# Patient Record
Sex: Female | Born: 1940
Health system: Southern US, Community
[De-identification: ages and names within clinical notes are randomized; demographics above are authoritative.]

## PROBLEM LIST (undated history)

## (undated) DIAGNOSIS — E119 Type 2 diabetes mellitus without complications: Secondary | ICD-10-CM

## (undated) DIAGNOSIS — T7840XA Allergy, unspecified, initial encounter: Secondary | ICD-10-CM

## (undated) DIAGNOSIS — I10 Essential (primary) hypertension: Secondary | ICD-10-CM

## (undated) HISTORY — DX: Type 2 diabetes mellitus without complications: E11.9

## (undated) HISTORY — PX: ABDOMINAL HYSTERECTOMY: SHX81

## (undated) HISTORY — DX: Allergy, unspecified, initial encounter: T78.40XA

## (undated) HISTORY — DX: Essential (primary) hypertension: I10

---

## 1998-09-22 ENCOUNTER — Encounter: Payer: Self-pay | Admitting: Internal Medicine

## 1998-09-22 ENCOUNTER — Ambulatory Visit (HOSPITAL_COMMUNITY): Admission: RE | Admit: 1998-09-22 | Discharge: 1998-09-22 | Payer: Self-pay | Admitting: Internal Medicine

## 2000-01-01 ENCOUNTER — Encounter: Payer: Self-pay | Admitting: *Deleted

## 2000-01-01 ENCOUNTER — Ambulatory Visit (HOSPITAL_COMMUNITY): Admission: RE | Admit: 2000-01-01 | Discharge: 2000-01-01 | Payer: Self-pay | Admitting: *Deleted

## 2000-01-29 ENCOUNTER — Ambulatory Visit (HOSPITAL_COMMUNITY): Admission: RE | Admit: 2000-01-29 | Discharge: 2000-01-29 | Payer: Self-pay | Admitting: *Deleted

## 2000-01-29 ENCOUNTER — Encounter (INDEPENDENT_AMBULATORY_CARE_PROVIDER_SITE_OTHER): Payer: Self-pay | Admitting: Specialist

## 2000-12-06 ENCOUNTER — Encounter (INDEPENDENT_AMBULATORY_CARE_PROVIDER_SITE_OTHER): Payer: Self-pay | Admitting: *Deleted

## 2000-12-06 ENCOUNTER — Ambulatory Visit (HOSPITAL_COMMUNITY): Admission: RE | Admit: 2000-12-06 | Discharge: 2000-12-06 | Payer: Self-pay | Admitting: *Deleted

## 2001-01-02 ENCOUNTER — Encounter: Payer: Self-pay | Admitting: *Deleted

## 2001-01-02 ENCOUNTER — Ambulatory Visit (HOSPITAL_COMMUNITY): Admission: RE | Admit: 2001-01-02 | Discharge: 2001-01-02 | Payer: Self-pay | Admitting: *Deleted

## 2001-11-30 ENCOUNTER — Ambulatory Visit (HOSPITAL_COMMUNITY): Admission: RE | Admit: 2001-11-30 | Discharge: 2001-11-30 | Payer: Self-pay | Admitting: *Deleted

## 2001-11-30 ENCOUNTER — Encounter: Payer: Self-pay | Admitting: *Deleted

## 2002-01-03 ENCOUNTER — Encounter: Payer: Self-pay | Admitting: *Deleted

## 2002-01-03 ENCOUNTER — Ambulatory Visit (HOSPITAL_COMMUNITY): Admission: RE | Admit: 2002-01-03 | Discharge: 2002-01-03 | Payer: Self-pay | Admitting: *Deleted

## 2003-01-09 ENCOUNTER — Encounter: Payer: Self-pay | Admitting: *Deleted

## 2003-01-09 ENCOUNTER — Ambulatory Visit (HOSPITAL_COMMUNITY): Admission: RE | Admit: 2003-01-09 | Discharge: 2003-01-09 | Payer: Self-pay | Admitting: *Deleted

## 2004-03-26 ENCOUNTER — Ambulatory Visit (HOSPITAL_COMMUNITY): Admission: RE | Admit: 2004-03-26 | Discharge: 2004-03-26 | Payer: Self-pay | Admitting: *Deleted

## 2005-04-09 ENCOUNTER — Ambulatory Visit (HOSPITAL_COMMUNITY): Admission: RE | Admit: 2005-04-09 | Discharge: 2005-04-09 | Payer: Self-pay | Admitting: Internal Medicine

## 2006-04-27 ENCOUNTER — Ambulatory Visit (HOSPITAL_COMMUNITY): Admission: RE | Admit: 2006-04-27 | Discharge: 2006-04-27 | Payer: Self-pay | Admitting: Internal Medicine

## 2007-05-01 ENCOUNTER — Ambulatory Visit (HOSPITAL_COMMUNITY): Admission: RE | Admit: 2007-05-01 | Discharge: 2007-05-01 | Payer: Self-pay | Admitting: Internal Medicine

## 2008-04-24 ENCOUNTER — Encounter: Admission: RE | Admit: 2008-04-24 | Discharge: 2008-04-24 | Payer: Self-pay | Admitting: Internal Medicine

## 2008-05-01 ENCOUNTER — Ambulatory Visit (HOSPITAL_COMMUNITY): Admission: RE | Admit: 2008-05-01 | Discharge: 2008-05-01 | Payer: Self-pay | Admitting: Internal Medicine

## 2009-05-02 ENCOUNTER — Ambulatory Visit (HOSPITAL_COMMUNITY): Admission: RE | Admit: 2009-05-02 | Discharge: 2009-05-02 | Payer: Self-pay | Admitting: Internal Medicine

## 2010-05-07 ENCOUNTER — Ambulatory Visit (HOSPITAL_COMMUNITY): Admission: RE | Admit: 2010-05-07 | Discharge: 2010-05-07 | Payer: Self-pay | Admitting: Internal Medicine

## 2011-02-12 NOTE — Procedures (Signed)
[place]  Patient:    Leslie Reeves, Leslie Reeves                       MRN: [id number] Proc. Date: 01/29/00 Adm. Date:  [id number] Disc. Date: [id number] Attending:  Woody Seller                           Procedure Report  REFERRING PHYSICIAN:  Woody Seller., M.D.  PREOPERATIVE DIAGNOSES: 1. Abdominal pains. 2. Constipation.  POSTOPERATIVE DIAGNOSIS:  Multiple small polypoid lesions throughout the ascending and transverse colon, removed via hot biopsy.  PROCEDURE:  Colonoscopy and hot biopsy.  ENDOSCOPIST:  Katherina Mires, Brooke Bonito., M.D.  MEDICATIONS:  Demerol 60 mg IV, Versed 6 mg IV over 10 minute period of time.  INSTRUMENT:  Olympus video pancolonoscope.  INDICATIONS:  This pleasant 70 year old female presented to the office for evaluation of abdominal pains and discomforts.  The pains appeared to be more generalized in character at this time.  There was no history of any change in stool color or character that was noted.  The patient was noted that the symptoms progressively worsened at this time.  Previous evaluations appeared to be grossly unremarkable at this time.  The patient was thus scheduled for a colonoscopic examination.  OBJECTIVE FINDINGS:  Her vital signs were stable.  Her HEENT examination was anicteric.  NECK was supple.  LUNGS were clear.  HEART had a regular rate and rhythm without heaves, thrills, murmurs or gallops.  The ABDOMEN was obese, mild tenderness to palpation and generalized abdomen region at this time.  No rebound or referred tenderness appreciated.  Digital rectal examination was normal.  The EXTREMITIES revealed no clubbing, cyanosis, or edema.  PLAN:  To proceed with the colonoscopic examination.  INFORMED CONSENT:  Patient was advised of the procedure, indications, and the risk involved.  She has agreed to have the procedure performed.  PREOPERATIVE PREPARATION:  Patient was brought in the endoscopy unit, when  an IV for IV sedating medication was started.  A monitor was placed on the patient to monitor the patients vital signs and oxygen saturation.  Nasal oxygen at 2 L/min was used and after adequate sedation was performed, the procedure was begun.  PROCEDURE NOTE:  The instrument was advanced, patient lying in left lateral position via direct technique without difficulty to approximately 97 cm proximal colon to the cecal region.  This was confirmed by palpation, as well as, visualization of the ileocecal valve.  There appeared to be evidence of small polypoid lesions present in the transverse and ascending colon area that was noted.  These polyps appeared to be 1-3 mm in size.  Multiple hot biopsies were obtained at this time.  Postbiopsies, there was some evidence of residual heme that was noted.  The areas were flushed with water and there was no evidence of any continued bleeding that was noted.  At this time.  Otherwise, there were no abnormalities, such as any other masses or strictures appreciated at this time.  The vascular pattern appeared to be well within normal limits throughout the entire area.  The mucosal pattern showed no evidence of an occasional diverticuli that was appreciated, mostly in the ascending region that was noted at this time.  The diverticula appeared to be small without any inflammatory or inflammation around the area.  There was increased twisting of the bowel tissue that was noted, almost consistent  with a possible volvulus that could be occurring.  Photographs were taken of the areas that were noted.  However, the instrument was able to advance through these regions without any complications appreciated.  The instrument was subsequently retracted back, where the polypoid lesions were removed at this time.  No evidence of any major complications was noted. Although, a few areas were not biopsied, several of the ones that were noted, were hot biopsied and  removed and although the characteristics of them appeared to be essentially normal tissue from that stand point.  On occasion, residual heme was noted, again, but these areas subsequently stopped.  There was one polypoid lesion that was not biopsied at this time because of its nature and character in the ascending area.  This will be reevaluated at the next time the procedure is performed and depending upon these results will determine the course of therapy from that standpoint.  The instrument was removed per rectum without difficulty with the patient tolerating the procedure well.  TREATMENT: 1. To repeat the procedure in one year. 2. Await the results of a pathology report. 3. Have the patient follow up in the office. 4. Have the patient avoid any aspirin-like therapy or increased distension of    the abdomen for at least the next several days or to a week at this time.    Depending upon these results will determine the course of therapy. DD:  01/29/00 TD:  02/01/00 Job: [id number]

## 2011-04-05 ENCOUNTER — Other Ambulatory Visit (HOSPITAL_COMMUNITY): Payer: Self-pay | Admitting: Internal Medicine

## 2011-04-05 DIAGNOSIS — Z1231 Encounter for screening mammogram for malignant neoplasm of breast: Secondary | ICD-10-CM

## 2011-05-10 ENCOUNTER — Ambulatory Visit (HOSPITAL_COMMUNITY)
Admission: RE | Admit: 2011-05-10 | Discharge: 2011-05-10 | Disposition: A | Discharge status: Home / Self Care | Payer: Medicare Other | Source: Ambulatory Visit | Attending: Internal Medicine | Admitting: Internal Medicine

## 2011-05-10 DIAGNOSIS — Z1231 Encounter for screening mammogram for malignant neoplasm of breast: Secondary | ICD-10-CM | POA: Insufficient documentation

## 2012-04-18 ENCOUNTER — Other Ambulatory Visit (HOSPITAL_COMMUNITY): Payer: Self-pay | Admitting: Internal Medicine

## 2012-04-18 DIAGNOSIS — Z1231 Encounter for screening mammogram for malignant neoplasm of breast: Secondary | ICD-10-CM

## 2012-05-10 ENCOUNTER — Ambulatory Visit (HOSPITAL_COMMUNITY)
Admission: RE | Admit: 2012-05-10 | Discharge: 2012-05-10 | Disposition: A | Discharge status: Home / Self Care | Payer: Medicare Other | Source: Ambulatory Visit | Attending: Internal Medicine | Admitting: Internal Medicine

## 2012-05-10 DIAGNOSIS — Z1231 Encounter for screening mammogram for malignant neoplasm of breast: Secondary | ICD-10-CM | POA: Insufficient documentation

## 2013-04-13 ENCOUNTER — Other Ambulatory Visit (HOSPITAL_COMMUNITY): Payer: Self-pay | Admitting: Internal Medicine

## 2013-04-13 DIAGNOSIS — Z1231 Encounter for screening mammogram for malignant neoplasm of breast: Secondary | ICD-10-CM

## 2013-05-14 ENCOUNTER — Ambulatory Visit (HOSPITAL_COMMUNITY)
Admission: RE | Admit: 2013-05-14 | Discharge: 2013-05-14 | Disposition: A | Discharge status: Home / Self Care | Payer: Medicare Other | Source: Ambulatory Visit | Attending: Internal Medicine | Admitting: Internal Medicine

## 2013-05-14 DIAGNOSIS — Z1231 Encounter for screening mammogram for malignant neoplasm of breast: Secondary | ICD-10-CM | POA: Insufficient documentation

## 2014-05-10 ENCOUNTER — Other Ambulatory Visit (HOSPITAL_COMMUNITY): Payer: Self-pay | Admitting: Internal Medicine

## 2014-05-10 DIAGNOSIS — Z1231 Encounter for screening mammogram for malignant neoplasm of breast: Secondary | ICD-10-CM

## 2014-05-29 ENCOUNTER — Ambulatory Visit (HOSPITAL_COMMUNITY)
Admission: RE | Admit: 2014-05-29 | Discharge: 2014-05-29 | Disposition: A | Discharge status: Home / Self Care | Payer: 59 | Source: Ambulatory Visit | Attending: Internal Medicine | Admitting: Internal Medicine

## 2014-05-29 DIAGNOSIS — Z1231 Encounter for screening mammogram for malignant neoplasm of breast: Secondary | ICD-10-CM | POA: Diagnosis present

## 2014-09-24 ENCOUNTER — Ambulatory Visit
Admission: RE | Admit: 2014-09-24 | Discharge: 2014-09-24 | Disposition: A | Discharge status: Home / Self Care | Payer: Medicare Other | Source: Ambulatory Visit | Attending: Family | Admitting: Family

## 2014-09-24 ENCOUNTER — Other Ambulatory Visit: Payer: Self-pay | Admitting: Family

## 2014-09-24 DIAGNOSIS — E049 Nontoxic goiter, unspecified: Secondary | ICD-10-CM

## 2014-10-07 ENCOUNTER — Other Ambulatory Visit: Payer: Self-pay | Admitting: Family

## 2014-10-07 DIAGNOSIS — E049 Nontoxic goiter, unspecified: Secondary | ICD-10-CM

## 2014-12-13 DIAGNOSIS — E039 Hypothyroidism, unspecified: Secondary | ICD-10-CM | POA: Diagnosis not present

## 2014-12-13 DIAGNOSIS — I129 Hypertensive chronic kidney disease with stage 1 through stage 4 chronic kidney disease, or unspecified chronic kidney disease: Secondary | ICD-10-CM | POA: Diagnosis not present

## 2014-12-13 DIAGNOSIS — N183 Chronic kidney disease, stage 3 (moderate): Secondary | ICD-10-CM | POA: Diagnosis not present

## 2014-12-13 DIAGNOSIS — J309 Allergic rhinitis, unspecified: Secondary | ICD-10-CM | POA: Diagnosis not present

## 2014-12-13 DIAGNOSIS — E1121 Type 2 diabetes mellitus with diabetic nephropathy: Secondary | ICD-10-CM | POA: Diagnosis not present

## 2014-12-13 DIAGNOSIS — J449 Chronic obstructive pulmonary disease, unspecified: Secondary | ICD-10-CM | POA: Diagnosis not present

## 2015-03-10 DIAGNOSIS — Z0100 Encounter for examination of eyes and vision without abnormal findings: Secondary | ICD-10-CM | POA: Diagnosis not present

## 2015-03-10 DIAGNOSIS — E119 Type 2 diabetes mellitus without complications: Secondary | ICD-10-CM | POA: Diagnosis not present

## 2015-03-17 DIAGNOSIS — J209 Acute bronchitis, unspecified: Secondary | ICD-10-CM | POA: Diagnosis not present

## 2015-03-17 DIAGNOSIS — I129 Hypertensive chronic kidney disease with stage 1 through stage 4 chronic kidney disease, or unspecified chronic kidney disease: Secondary | ICD-10-CM | POA: Diagnosis not present

## 2015-03-17 DIAGNOSIS — N183 Chronic kidney disease, stage 3 (moderate): Secondary | ICD-10-CM | POA: Diagnosis not present

## 2015-03-17 DIAGNOSIS — E1121 Type 2 diabetes mellitus with diabetic nephropathy: Secondary | ICD-10-CM | POA: Diagnosis not present

## 2015-03-17 DIAGNOSIS — R252 Cramp and spasm: Secondary | ICD-10-CM | POA: Diagnosis not present

## 2015-03-19 DIAGNOSIS — Z5181 Encounter for therapeutic drug level monitoring: Secondary | ICD-10-CM | POA: Diagnosis not present

## 2015-03-19 DIAGNOSIS — E039 Hypothyroidism, unspecified: Secondary | ICD-10-CM | POA: Diagnosis not present

## 2015-03-19 DIAGNOSIS — J449 Chronic obstructive pulmonary disease, unspecified: Secondary | ICD-10-CM | POA: Diagnosis not present

## 2015-03-19 DIAGNOSIS — N183 Chronic kidney disease, stage 3 (moderate): Secondary | ICD-10-CM | POA: Diagnosis not present

## 2015-03-19 DIAGNOSIS — Z79899 Other long term (current) drug therapy: Secondary | ICD-10-CM | POA: Diagnosis not present

## 2015-03-19 DIAGNOSIS — R32 Unspecified urinary incontinence: Secondary | ICD-10-CM | POA: Diagnosis not present

## 2015-04-28 ENCOUNTER — Other Ambulatory Visit (HOSPITAL_COMMUNITY): Payer: Self-pay | Admitting: Internal Medicine

## 2015-04-28 DIAGNOSIS — Z1231 Encounter for screening mammogram for malignant neoplasm of breast: Secondary | ICD-10-CM

## 2015-06-04 ENCOUNTER — Ambulatory Visit (HOSPITAL_COMMUNITY): Payer: Medicaid Other

## 2015-06-11 ENCOUNTER — Ambulatory Visit (HOSPITAL_COMMUNITY)
Admission: RE | Admit: 2015-06-11 | Discharge: 2015-06-11 | Disposition: A | Discharge status: Home / Self Care | Payer: Medicare Other | Source: Ambulatory Visit | Attending: Internal Medicine | Admitting: Internal Medicine

## 2015-06-11 DIAGNOSIS — Z1231 Encounter for screening mammogram for malignant neoplasm of breast: Secondary | ICD-10-CM | POA: Insufficient documentation

## 2015-06-23 DIAGNOSIS — E782 Mixed hyperlipidemia: Secondary | ICD-10-CM | POA: Diagnosis not present

## 2015-06-23 DIAGNOSIS — N183 Chronic kidney disease, stage 3 (moderate): Secondary | ICD-10-CM | POA: Diagnosis not present

## 2015-06-23 DIAGNOSIS — R252 Cramp and spasm: Secondary | ICD-10-CM | POA: Diagnosis not present

## 2015-06-23 DIAGNOSIS — E1121 Type 2 diabetes mellitus with diabetic nephropathy: Secondary | ICD-10-CM | POA: Diagnosis not present

## 2015-06-23 DIAGNOSIS — I129 Hypertensive chronic kidney disease with stage 1 through stage 4 chronic kidney disease, or unspecified chronic kidney disease: Secondary | ICD-10-CM | POA: Diagnosis not present

## 2015-09-18 DIAGNOSIS — E039 Hypothyroidism, unspecified: Secondary | ICD-10-CM | POA: Diagnosis not present

## 2015-09-18 DIAGNOSIS — I129 Hypertensive chronic kidney disease with stage 1 through stage 4 chronic kidney disease, or unspecified chronic kidney disease: Secondary | ICD-10-CM | POA: Diagnosis not present

## 2015-09-18 DIAGNOSIS — N183 Chronic kidney disease, stage 3 (moderate): Secondary | ICD-10-CM | POA: Diagnosis not present

## 2015-09-18 DIAGNOSIS — E782 Mixed hyperlipidemia: Secondary | ICD-10-CM | POA: Diagnosis not present

## 2015-09-18 DIAGNOSIS — R7309 Other abnormal glucose: Secondary | ICD-10-CM | POA: Diagnosis not present

## 2015-09-18 DIAGNOSIS — J22 Unspecified acute lower respiratory infection: Secondary | ICD-10-CM | POA: Diagnosis not present

## 2015-12-05 DIAGNOSIS — Z720 Tobacco use: Secondary | ICD-10-CM | POA: Diagnosis not present

## 2015-12-05 DIAGNOSIS — N183 Chronic kidney disease, stage 3 (moderate): Secondary | ICD-10-CM | POA: Diagnosis not present

## 2015-12-05 DIAGNOSIS — E049 Nontoxic goiter, unspecified: Secondary | ICD-10-CM | POA: Diagnosis not present

## 2015-12-05 DIAGNOSIS — I129 Hypertensive chronic kidney disease with stage 1 through stage 4 chronic kidney disease, or unspecified chronic kidney disease: Secondary | ICD-10-CM | POA: Diagnosis not present

## 2015-12-16 ENCOUNTER — Other Ambulatory Visit: Payer: Self-pay | Admitting: Nephrology

## 2015-12-16 DIAGNOSIS — E049 Nontoxic goiter, unspecified: Secondary | ICD-10-CM

## 2015-12-16 DIAGNOSIS — N183 Chronic kidney disease, stage 3 unspecified: Secondary | ICD-10-CM

## 2015-12-17 ENCOUNTER — Ambulatory Visit
Admission: RE | Admit: 2015-12-17 | Discharge: 2015-12-17 | Disposition: A | Discharge status: Home / Self Care | Payer: Medicare Other | Source: Ambulatory Visit | Attending: Nephrology | Admitting: Nephrology

## 2015-12-17 DIAGNOSIS — E049 Nontoxic goiter, unspecified: Secondary | ICD-10-CM

## 2015-12-17 DIAGNOSIS — N183 Chronic kidney disease, stage 3 unspecified: Secondary | ICD-10-CM

## 2015-12-25 DIAGNOSIS — E1121 Type 2 diabetes mellitus with diabetic nephropathy: Secondary | ICD-10-CM | POA: Diagnosis not present

## 2015-12-25 DIAGNOSIS — N183 Chronic kidney disease, stage 3 (moderate): Secondary | ICD-10-CM | POA: Diagnosis not present

## 2015-12-25 DIAGNOSIS — I129 Hypertensive chronic kidney disease with stage 1 through stage 4 chronic kidney disease, or unspecified chronic kidney disease: Secondary | ICD-10-CM | POA: Diagnosis not present

## 2015-12-25 DIAGNOSIS — E782 Mixed hyperlipidemia: Secondary | ICD-10-CM | POA: Diagnosis not present

## 2015-12-25 DIAGNOSIS — E039 Hypothyroidism, unspecified: Secondary | ICD-10-CM | POA: Diagnosis not present

## 2016-01-26 DIAGNOSIS — E1121 Type 2 diabetes mellitus with diabetic nephropathy: Secondary | ICD-10-CM | POA: Diagnosis not present

## 2016-01-26 DIAGNOSIS — N183 Chronic kidney disease, stage 3 (moderate): Secondary | ICD-10-CM | POA: Diagnosis not present

## 2016-01-26 DIAGNOSIS — I129 Hypertensive chronic kidney disease with stage 1 through stage 4 chronic kidney disease, or unspecified chronic kidney disease: Secondary | ICD-10-CM | POA: Diagnosis not present

## 2016-01-26 DIAGNOSIS — E039 Hypothyroidism, unspecified: Secondary | ICD-10-CM | POA: Diagnosis not present

## 2016-01-26 DIAGNOSIS — N08 Glomerular disorders in diseases classified elsewhere: Secondary | ICD-10-CM | POA: Diagnosis not present

## 2016-02-26 DIAGNOSIS — I129 Hypertensive chronic kidney disease with stage 1 through stage 4 chronic kidney disease, or unspecified chronic kidney disease: Secondary | ICD-10-CM | POA: Diagnosis not present

## 2016-02-26 DIAGNOSIS — Z720 Tobacco use: Secondary | ICD-10-CM | POA: Diagnosis not present

## 2016-02-26 DIAGNOSIS — N183 Chronic kidney disease, stage 3 (moderate): Secondary | ICD-10-CM | POA: Diagnosis not present

## 2016-03-22 DIAGNOSIS — E119 Type 2 diabetes mellitus without complications: Secondary | ICD-10-CM | POA: Diagnosis not present

## 2016-05-03 DIAGNOSIS — E1121 Type 2 diabetes mellitus with diabetic nephropathy: Secondary | ICD-10-CM | POA: Diagnosis not present

## 2016-05-03 DIAGNOSIS — E782 Mixed hyperlipidemia: Secondary | ICD-10-CM | POA: Diagnosis not present

## 2016-05-03 DIAGNOSIS — E039 Hypothyroidism, unspecified: Secondary | ICD-10-CM | POA: Diagnosis not present

## 2016-05-03 DIAGNOSIS — N08 Glomerular disorders in diseases classified elsewhere: Secondary | ICD-10-CM | POA: Diagnosis not present

## 2016-05-03 DIAGNOSIS — I129 Hypertensive chronic kidney disease with stage 1 through stage 4 chronic kidney disease, or unspecified chronic kidney disease: Secondary | ICD-10-CM | POA: Diagnosis not present

## 2016-05-14 ENCOUNTER — Other Ambulatory Visit: Payer: Self-pay | Admitting: Internal Medicine

## 2016-05-14 DIAGNOSIS — Z1231 Encounter for screening mammogram for malignant neoplasm of breast: Secondary | ICD-10-CM

## 2016-06-11 ENCOUNTER — Ambulatory Visit
Admission: RE | Admit: 2016-06-11 | Discharge: 2016-06-11 | Disposition: A | Discharge status: Home / Self Care | Payer: Medicare Other | Source: Ambulatory Visit | Attending: Internal Medicine | Admitting: Internal Medicine

## 2016-06-11 DIAGNOSIS — Z1231 Encounter for screening mammogram for malignant neoplasm of breast: Secondary | ICD-10-CM

## 2016-08-02 DIAGNOSIS — E782 Mixed hyperlipidemia: Secondary | ICD-10-CM | POA: Diagnosis not present

## 2016-08-02 DIAGNOSIS — N08 Glomerular disorders in diseases classified elsewhere: Secondary | ICD-10-CM | POA: Diagnosis not present

## 2016-08-02 DIAGNOSIS — N183 Chronic kidney disease, stage 3 (moderate): Secondary | ICD-10-CM | POA: Diagnosis not present

## 2016-08-02 DIAGNOSIS — E039 Hypothyroidism, unspecified: Secondary | ICD-10-CM | POA: Diagnosis not present

## 2016-08-02 DIAGNOSIS — E1121 Type 2 diabetes mellitus with diabetic nephropathy: Secondary | ICD-10-CM | POA: Diagnosis not present

## 2016-08-02 DIAGNOSIS — I129 Hypertensive chronic kidney disease with stage 1 through stage 4 chronic kidney disease, or unspecified chronic kidney disease: Secondary | ICD-10-CM | POA: Diagnosis not present

## 2016-08-02 DIAGNOSIS — N182 Chronic kidney disease, stage 2 (mild): Secondary | ICD-10-CM | POA: Diagnosis not present

## 2017-01-10 DIAGNOSIS — E782 Mixed hyperlipidemia: Secondary | ICD-10-CM | POA: Diagnosis not present

## 2017-01-10 DIAGNOSIS — J45909 Unspecified asthma, uncomplicated: Secondary | ICD-10-CM | POA: Diagnosis not present

## 2017-01-10 DIAGNOSIS — E039 Hypothyroidism, unspecified: Secondary | ICD-10-CM | POA: Diagnosis not present

## 2017-01-10 DIAGNOSIS — I129 Hypertensive chronic kidney disease with stage 1 through stage 4 chronic kidney disease, or unspecified chronic kidney disease: Secondary | ICD-10-CM | POA: Diagnosis not present

## 2017-03-01 DIAGNOSIS — H524 Presbyopia: Secondary | ICD-10-CM | POA: Diagnosis not present

## 2017-03-01 DIAGNOSIS — E119 Type 2 diabetes mellitus without complications: Secondary | ICD-10-CM | POA: Diagnosis not present

## 2017-04-19 DIAGNOSIS — I129 Hypertensive chronic kidney disease with stage 1 through stage 4 chronic kidney disease, or unspecified chronic kidney disease: Secondary | ICD-10-CM | POA: Diagnosis not present

## 2017-04-19 DIAGNOSIS — Z87891 Personal history of nicotine dependence: Secondary | ICD-10-CM | POA: Diagnosis not present

## 2017-04-19 DIAGNOSIS — N183 Chronic kidney disease, stage 3 (moderate): Secondary | ICD-10-CM | POA: Diagnosis not present

## 2017-04-29 DIAGNOSIS — E559 Vitamin D deficiency, unspecified: Secondary | ICD-10-CM | POA: Diagnosis not present

## 2017-04-29 DIAGNOSIS — H6123 Impacted cerumen, bilateral: Secondary | ICD-10-CM | POA: Diagnosis not present

## 2017-04-29 DIAGNOSIS — N951 Menopausal and female climacteric states: Secondary | ICD-10-CM | POA: Diagnosis not present

## 2017-04-29 DIAGNOSIS — N183 Chronic kidney disease, stage 3 (moderate): Secondary | ICD-10-CM | POA: Diagnosis not present

## 2017-04-29 DIAGNOSIS — Z0000 Encounter for general adult medical examination without abnormal findings: Secondary | ICD-10-CM | POA: Diagnosis not present

## 2017-04-29 DIAGNOSIS — N08 Glomerular disorders in diseases classified elsewhere: Secondary | ICD-10-CM | POA: Diagnosis not present

## 2017-04-29 DIAGNOSIS — I129 Hypertensive chronic kidney disease with stage 1 through stage 4 chronic kidney disease, or unspecified chronic kidney disease: Secondary | ICD-10-CM | POA: Diagnosis not present

## 2017-04-29 DIAGNOSIS — Z1231 Encounter for screening mammogram for malignant neoplasm of breast: Secondary | ICD-10-CM | POA: Diagnosis not present

## 2017-04-29 DIAGNOSIS — E1121 Type 2 diabetes mellitus with diabetic nephropathy: Secondary | ICD-10-CM | POA: Diagnosis not present

## 2017-04-29 DIAGNOSIS — E039 Hypothyroidism, unspecified: Secondary | ICD-10-CM | POA: Diagnosis not present

## 2017-05-02 ENCOUNTER — Ambulatory Visit
Admission: RE | Admit: 2017-05-02 | Discharge: 2017-05-02 | Disposition: A | Discharge status: Home / Self Care | Payer: Medicare Other | Source: Ambulatory Visit | Attending: Nurse Practitioner | Admitting: Nurse Practitioner

## 2017-05-02 ENCOUNTER — Other Ambulatory Visit: Payer: Self-pay | Admitting: Nurse Practitioner

## 2017-05-02 ENCOUNTER — Other Ambulatory Visit: Payer: Self-pay | Admitting: Internal Medicine

## 2017-05-02 DIAGNOSIS — R053 Chronic cough: Secondary | ICD-10-CM

## 2017-05-02 DIAGNOSIS — R05 Cough: Secondary | ICD-10-CM

## 2017-05-02 DIAGNOSIS — Z1231 Encounter for screening mammogram for malignant neoplasm of breast: Secondary | ICD-10-CM

## 2017-05-03 ENCOUNTER — Other Ambulatory Visit: Payer: Self-pay | Admitting: Nurse Practitioner

## 2017-05-03 DIAGNOSIS — R634 Abnormal weight loss: Secondary | ICD-10-CM

## 2017-05-18 ENCOUNTER — Ambulatory Visit
Admission: RE | Admit: 2017-05-18 | Discharge: 2017-05-18 | Disposition: A | Discharge status: Home / Self Care | Payer: Medicare Other | Source: Ambulatory Visit | Attending: Nurse Practitioner | Admitting: Nurse Practitioner

## 2017-05-18 DIAGNOSIS — R634 Abnormal weight loss: Secondary | ICD-10-CM

## 2017-05-18 DIAGNOSIS — E042 Nontoxic multinodular goiter: Secondary | ICD-10-CM | POA: Diagnosis not present

## 2017-06-13 ENCOUNTER — Ambulatory Visit
Admission: RE | Admit: 2017-06-13 | Discharge: 2017-06-13 | Disposition: A | Discharge status: Home / Self Care | Payer: Medicare Other | Source: Ambulatory Visit | Attending: Internal Medicine | Admitting: Internal Medicine

## 2017-06-13 DIAGNOSIS — Z1231 Encounter for screening mammogram for malignant neoplasm of breast: Secondary | ICD-10-CM

## 2017-07-14 DIAGNOSIS — Z86010 Personal history of colonic polyps: Secondary | ICD-10-CM | POA: Diagnosis not present

## 2017-07-14 DIAGNOSIS — K5730 Diverticulosis of large intestine without perforation or abscess without bleeding: Secondary | ICD-10-CM | POA: Diagnosis not present

## 2017-07-14 DIAGNOSIS — Z1211 Encounter for screening for malignant neoplasm of colon: Secondary | ICD-10-CM | POA: Diagnosis not present

## 2017-07-14 DIAGNOSIS — K5904 Chronic idiopathic constipation: Secondary | ICD-10-CM | POA: Diagnosis not present

## 2017-07-19 ENCOUNTER — Other Ambulatory Visit: Payer: Self-pay

## 2017-07-19 NOTE — Patient Outreach (Signed)
[facility]) Care Management  07/19/2017  Leslie Reeves September 04, 1941 [id number]   Medication Adherence call to Leslie Reeves the reason for this call is because Leslie Reeves is showing past due under [place] health Care Ins.on metformin 500 mg diabetes medication spoke to patient she said doctor Darleen Crocker told her to take half of tablet instead if one tablet daily patient still has medication.   [place] Management Direct Dial [phone]  Fax [phone] [name]

## 2017-07-27 DIAGNOSIS — D120 Benign neoplasm of cecum: Secondary | ICD-10-CM | POA: Diagnosis not present

## 2017-07-27 DIAGNOSIS — D123 Benign neoplasm of transverse colon: Secondary | ICD-10-CM | POA: Diagnosis not present

## 2017-07-27 DIAGNOSIS — Z86010 Personal history of colonic polyps: Secondary | ICD-10-CM | POA: Diagnosis not present

## 2017-07-27 DIAGNOSIS — K635 Polyp of colon: Secondary | ICD-10-CM | POA: Diagnosis not present

## 2017-07-27 DIAGNOSIS — Z1211 Encounter for screening for malignant neoplasm of colon: Secondary | ICD-10-CM | POA: Diagnosis not present

## 2017-11-04 DIAGNOSIS — I129 Hypertensive chronic kidney disease with stage 1 through stage 4 chronic kidney disease, or unspecified chronic kidney disease: Secondary | ICD-10-CM | POA: Diagnosis not present

## 2017-11-04 DIAGNOSIS — R7303 Prediabetes: Secondary | ICD-10-CM | POA: Diagnosis not present

## 2017-11-04 DIAGNOSIS — N08 Glomerular disorders in diseases classified elsewhere: Secondary | ICD-10-CM | POA: Diagnosis not present

## 2017-11-04 DIAGNOSIS — E039 Hypothyroidism, unspecified: Secondary | ICD-10-CM | POA: Diagnosis not present

## 2017-11-04 DIAGNOSIS — E782 Mixed hyperlipidemia: Secondary | ICD-10-CM | POA: Diagnosis not present

## 2018-01-11 DIAGNOSIS — Z86010 Personal history of colonic polyps: Secondary | ICD-10-CM | POA: Diagnosis not present

## 2018-01-11 DIAGNOSIS — D125 Benign neoplasm of sigmoid colon: Secondary | ICD-10-CM | POA: Diagnosis not present

## 2018-01-11 DIAGNOSIS — D120 Benign neoplasm of cecum: Secondary | ICD-10-CM | POA: Diagnosis not present

## 2018-01-11 DIAGNOSIS — K635 Polyp of colon: Secondary | ICD-10-CM | POA: Diagnosis not present

## 2018-01-11 DIAGNOSIS — Z1211 Encounter for screening for malignant neoplasm of colon: Secondary | ICD-10-CM | POA: Diagnosis not present

## 2018-03-07 DIAGNOSIS — E119 Type 2 diabetes mellitus without complications: Secondary | ICD-10-CM | POA: Diagnosis not present

## 2018-05-10 ENCOUNTER — Other Ambulatory Visit: Payer: Self-pay | Admitting: Internal Medicine

## 2018-05-10 DIAGNOSIS — Z1231 Encounter for screening mammogram for malignant neoplasm of breast: Secondary | ICD-10-CM

## 2018-05-12 ENCOUNTER — Other Ambulatory Visit: Payer: Self-pay | Admitting: Internal Medicine

## 2018-05-12 DIAGNOSIS — E2839 Other primary ovarian failure: Secondary | ICD-10-CM

## 2018-05-12 DIAGNOSIS — E049 Nontoxic goiter, unspecified: Secondary | ICD-10-CM

## 2018-05-15 ENCOUNTER — Ambulatory Visit
Admission: RE | Admit: 2018-05-15 | Discharge: 2018-05-15 | Disposition: A | Discharge status: Home / Self Care | Payer: Medicare Other | Source: Ambulatory Visit | Attending: Internal Medicine | Admitting: Internal Medicine

## 2018-05-15 DIAGNOSIS — E049 Nontoxic goiter, unspecified: Secondary | ICD-10-CM

## 2018-06-14 ENCOUNTER — Ambulatory Visit: Payer: Medicare Other

## 2018-07-03 ENCOUNTER — Ambulatory Visit
Admission: RE | Admit: 2018-07-03 | Discharge: 2018-07-03 | Disposition: A | Discharge status: Home / Self Care | Payer: Medicare Other | Source: Ambulatory Visit | Attending: Internal Medicine | Admitting: Internal Medicine

## 2018-07-03 DIAGNOSIS — E2839 Other primary ovarian failure: Secondary | ICD-10-CM

## 2018-07-03 DIAGNOSIS — Z1231 Encounter for screening mammogram for malignant neoplasm of breast: Secondary | ICD-10-CM

## 2018-07-04 ENCOUNTER — Other Ambulatory Visit: Payer: Self-pay | Admitting: Internal Medicine

## 2018-08-04 ENCOUNTER — Ambulatory Visit (INDEPENDENT_AMBULATORY_CARE_PROVIDER_SITE_OTHER): Payer: Medicare Other | Admitting: Nurse Practitioner

## 2018-08-04 ENCOUNTER — Encounter: Payer: Self-pay | Admitting: Nurse Practitioner

## 2018-08-04 VITALS — BP 130/60 | HR 54 | Temp 98.0°F | Ht 61.0 in | Wt 115.2 lb

## 2018-08-04 DIAGNOSIS — E041 Nontoxic single thyroid nodule: Secondary | ICD-10-CM | POA: Diagnosis not present

## 2018-08-04 NOTE — Progress Notes (Addendum)
  Subjective:     Patient ID: Leslie Reeves , female    DOB: [birth year] , 77 y.o.   MRN: [id number]   Chief Complaint  Patient presents with  . Diabetes    HPI  Thyroid Problem  Presents for follow-up visit. Patient reports no constipation or palpitations. (She is here after having a thyroid ultrasound in August.  Here for results. )     Past Medical History:  Diagnosis Date  . Allergy   . Diabetes mellitus without complication ([place])   . Hypertension      History reviewed. No pertinent family history.   Current Outpatient Medications:  .  amLODipine (NORVASC) 10 MG tablet, Take 10 mg by mouth daily., Disp: , Rfl: 1 .  metFORMIN (GLUCOPHAGE) 500 MG tablet, TAKE 1/2 TABLET TWICE A DAY, Disp: 90 tablet, Rfl: 5 .  olmesartan-hydrochlorothiazide (BENICAR HCT) 20-12.5 MG tablet, TAKE ONE TABLET BY MOUTH DAILY, Disp: , Rfl:  .  PROAIR HFA 108 (90 Base) MCG/ACT inhaler, , Disp: , Rfl:  .  rosuvastatin (CRESTOR) 20 MG tablet, Take 20 mg by mouth daily., Disp: , Rfl: 2 .  Vitamin D, Ergocalciferol, (DRISDOL) 1.25 MG (50000 UT) CAPS capsule, TAKE ONE TWICE A WEEK, Disp: , Rfl: 0 .  WIXELA INHUB 100-50 MCG/DOSE AEPB, TAKE 1 PUFF BY MOUTH EVERY 12 HOURS IN THE MORNING AND IN THE EVENING, Disp: , Rfl: 1   No Known Allergies   Review of Systems  HENT: Negative.   Respiratory: Negative.   Cardiovascular: Negative.  Negative for palpitations.  Gastrointestinal: Negative for constipation and nausea.  Musculoskeletal: Negative.   Skin: Negative.      Today's Vitals   08/04/18 0925  BP: 130/60  Pulse: (!) 54  Temp: 98 F (36.7 C)  TempSrc: Oral  SpO2: 94%  Weight: 115 lb 3.2 oz (52.3 kg)  Height: _0  (1.549 m)   Body mass index is 21.77 kg/m.   Objective:  Physical Exam  Constitutional: She is oriented to person, place, and time. She appears well-developed and well-nourished.  Cardiovascular: Normal rate, regular rhythm and normal heart sounds.  Pulmonary/Chest: Effort  normal. She has decreased breath sounds in the right upper field, the right lower field, the left upper field and the left lower field. She exhibits no tenderness.  Neurological: She is alert and oriented to person, place, and time.  Skin: Skin is warm and dry.        Assessment And Plan:     1. Right thyroid nodule  She has a right thyroid nodule that needs further evaluation according to the ultrasound and thyroid criteria.    Decreased breath sounds are normal for patient she has a history of smoking  - Ambulatory referral to ENT        [name], FNP

## 2018-08-09 ENCOUNTER — Other Ambulatory Visit: Payer: Self-pay | Admitting: Nurse Practitioner

## 2018-08-21 ENCOUNTER — Telehealth: Payer: Self-pay

## 2018-08-21 NOTE — Telephone Encounter (Signed)
Called pt to see if she is taking her blood pressure medicine olmesartan hctz because her ins is stating she may not be taking it. Patient stated she is currently taking it everyday. YRL,RMA

## 2018-08-23 DIAGNOSIS — E042 Nontoxic multinodular goiter: Secondary | ICD-10-CM | POA: Diagnosis not present

## 2018-08-23 DIAGNOSIS — E041 Nontoxic single thyroid nodule: Secondary | ICD-10-CM | POA: Diagnosis not present

## 2018-08-28 ENCOUNTER — Other Ambulatory Visit: Payer: Self-pay | Admitting: Otolaryngology

## 2018-08-28 DIAGNOSIS — E042 Nontoxic multinodular goiter: Secondary | ICD-10-CM

## 2018-08-31 ENCOUNTER — Ambulatory Visit
Admission: RE | Admit: 2018-08-31 | Discharge: 2018-08-31 | Disposition: A | Discharge status: Home / Self Care | Payer: Medicare Other | Source: Ambulatory Visit | Attending: Otolaryngology | Admitting: Otolaryngology

## 2018-08-31 ENCOUNTER — Other Ambulatory Visit (HOSPITAL_COMMUNITY)
Admission: RE | Admit: 2018-08-31 | Discharge: 2018-08-31 | Disposition: A | Discharge status: Home / Self Care | Payer: Medicare Other | Source: Ambulatory Visit | Attending: Radiology | Admitting: Radiology

## 2018-08-31 DIAGNOSIS — E041 Nontoxic single thyroid nodule: Secondary | ICD-10-CM | POA: Diagnosis not present

## 2018-08-31 DIAGNOSIS — E042 Nontoxic multinodular goiter: Secondary | ICD-10-CM | POA: Insufficient documentation

## 2018-09-05 ENCOUNTER — Other Ambulatory Visit: Payer: Self-pay

## 2018-09-05 NOTE — Patient Outreach (Signed)
[place] [facility]) Care Management  09/05/2018  Leslie Reeves [birth year] [id number]   Medication Adherence call to Leslie Reeves  spoke with patient she is showing due on Metformin 500 mg patient pick up this medication from [facility]  two weeks ago for a 90 days supply patient is not due until February /2020. [name] is showing past due under [place].   [place] Management Direct Dial [phone]  Fax [phone] [email or web address]

## 2018-10-09 DIAGNOSIS — E041 Nontoxic single thyroid nodule: Secondary | ICD-10-CM | POA: Insufficient documentation

## 2018-10-30 ENCOUNTER — Other Ambulatory Visit: Payer: Self-pay | Admitting: Nurse Practitioner

## 2018-11-10 ENCOUNTER — Other Ambulatory Visit: Payer: Self-pay | Admitting: Nurse Practitioner

## 2018-11-23 ENCOUNTER — Other Ambulatory Visit: Payer: Self-pay | Admitting: Nurse Practitioner

## 2018-11-23 DIAGNOSIS — R7303 Prediabetes: Secondary | ICD-10-CM

## 2018-11-23 MED ORDER — BLOOD GLUCOSE MONITOR KIT: PACK | 0 refills | Status: AC

## 2018-11-23 MED ORDER — BLOOD GLUCOSE MONITOR KIT: PACK | 0 refills | Status: DC

## 2018-11-27 ENCOUNTER — Other Ambulatory Visit: Payer: Self-pay | Admitting: Nurse Practitioner

## 2018-11-27 MED ORDER — GLUCOSE BLOOD VI STRP: ORAL_STRIP | 3 refills | Status: AC

## 2018-11-27 MED ORDER — GLUCOSE BLOOD VI STRP: ORAL_STRIP | 12 refills | Status: DC

## 2018-11-27 NOTE — Progress Notes (Signed)
Rx for test strips Aviva plus sent to [facility]

## 2018-12-01 ENCOUNTER — Other Ambulatory Visit: Payer: Self-pay | Admitting: Nurse Practitioner

## 2018-12-06 ENCOUNTER — Other Ambulatory Visit: Payer: Self-pay | Admitting: Internal Medicine

## 2018-12-10 ENCOUNTER — Other Ambulatory Visit: Payer: Self-pay | Admitting: Nurse Practitioner

## 2018-12-26 ENCOUNTER — Other Ambulatory Visit: Payer: Self-pay | Admitting: Nurse Practitioner

## 2019-01-04 ENCOUNTER — Telehealth: Payer: Self-pay

## 2019-01-04 NOTE — Telephone Encounter (Signed)
done

## 2019-01-15 DIAGNOSIS — I959 Hypotension, unspecified: Secondary | ICD-10-CM | POA: Diagnosis not present

## 2019-01-15 DIAGNOSIS — I10 Essential (primary) hypertension: Secondary | ICD-10-CM | POA: Diagnosis not present

## 2019-01-15 DIAGNOSIS — R4020 Unspecified coma: Secondary | ICD-10-CM | POA: Diagnosis not present

## 2019-01-15 DIAGNOSIS — R55 Syncope and collapse: Secondary | ICD-10-CM | POA: Diagnosis not present

## 2019-01-21 ENCOUNTER — Other Ambulatory Visit: Payer: Self-pay | Admitting: Nurse Practitioner

## 2019-01-31 ENCOUNTER — Other Ambulatory Visit: Payer: Self-pay | Admitting: Nurse Practitioner

## 2019-01-31 ENCOUNTER — Telehealth: Payer: Self-pay | Admitting: Internal Medicine

## 2019-01-31 NOTE — Telephone Encounter (Signed)
Pt agreed to telephone visit

## 2019-02-01 ENCOUNTER — Ambulatory Visit: Payer: Medicare Other | Admitting: Nurse Practitioner

## 2019-02-02 ENCOUNTER — Ambulatory Visit: Payer: Medicare Other | Admitting: Nurse Practitioner

## 2019-02-05 ENCOUNTER — Encounter: Payer: Self-pay | Admitting: Nurse Practitioner

## 2019-02-05 ENCOUNTER — Other Ambulatory Visit: Payer: Self-pay

## 2019-02-05 ENCOUNTER — Ambulatory Visit (INDEPENDENT_AMBULATORY_CARE_PROVIDER_SITE_OTHER): Payer: Medicare Other | Admitting: Nurse Practitioner

## 2019-02-05 VITALS — BP 128/60 | Wt 109.3 lb

## 2019-02-05 DIAGNOSIS — E041 Nontoxic single thyroid nodule: Secondary | ICD-10-CM

## 2019-02-05 DIAGNOSIS — R630 Anorexia: Secondary | ICD-10-CM | POA: Diagnosis not present

## 2019-02-05 DIAGNOSIS — I10 Essential (primary) hypertension: Secondary | ICD-10-CM

## 2019-02-05 DIAGNOSIS — R7303 Prediabetes: Secondary | ICD-10-CM

## 2019-02-05 NOTE — Progress Notes (Addendum)
Virtual Visit via telephone     This visit type was conducted due to national recommendations for restrictions regarding the COVID-19 Pandemic (e.g. social distancing) in an effort to limit this patient's exposure and mitigate transmission in our community.  Patients identity confirmed using two different identifiers.  This format is felt to be most appropriate for this patient at this time.  All issues noted in this document were discussed and addressed.  No physical exam was performed (except for noted visual exam findings with Video Visits).    Date:  02/19/2019   ID:  Leslie Reeves, DOB October 16, 1940, MRN [id number]  Patient Location:  Home - spoke with Lenore Cordia -   Provider location:   Office    Chief Complaint:  Diabetes   History of Present Illness:    Leslie Reeves is a 78 y.o. female who presents via video conferencing for a telehealth visit today.    The patient does not have symptoms concerning for COVID-19 infection (fever, chills, cough, or new shortness of breath).   Decreased appetite- she had a biopsy of her thyroid but has not followed up with Dr. Wilburn Cornelia.    Diabetes  She presents for her follow-up diabetic visit. Diabetes type: prediabetes. Her disease course has been stable. Pertinent negatives for hypoglycemia include no confusion or dizziness. Pertinent negatives for diabetes include no blurred vision and no chest pain. There are no hypoglycemic complications. Symptoms are stable. There are no diabetic complications. Risk factors for coronary artery disease include sedentary lifestyle. She is compliant with treatment all of the time.     Past Medical History:  Diagnosis Date  . Allergy   . Diabetes mellitus without complication ([place])   . Hypertension    Past Surgical History:  Procedure Laterality Date  . ABDOMINAL HYSTERECTOMY       Current Meds  Medication Sig  . albuterol (VENTOLIN HFA) 108 (90 Base) MCG/ACT inhaler INHALE TWO TIMES BY MOUTH  FOUR TIMES PER DAY  . amLODipine (NORVASC) 10 MG tablet TAKE 1 TABLET BY MOUTH EVERY DAY  . blood glucose meter kit and supplies KIT Dispense based on patient and insurance preference. Use up to four times daily as directed. (FOR ICD-9 250.00, 250.01).  [name] glucose blood (ACCU-CHEK AVIVA PLUS) test strip Use as instructed  . metFORMIN (GLUCOPHAGE) 500 MG tablet TAKE 1/2 TABLET TWICE A DAY  . olmesartan-hydrochlorothiazide (BENICAR HCT) 20-12.5 MG tablet TAKE ONE TABLET BY MOUTH DAILY  . rosuvastatin (CRESTOR) 20 MG tablet TAKE 1 TABLET BY MOUTH EVERY DAY  . Vitamin D, Ergocalciferol, (DRISDOL) 1.25 MG (50000 UT) CAPS capsule TAKE ONE TWICE A WEEK  . [DISCONTINUED] WIXELA INHUB 100-50 MCG/DOSE AEPB TAKE 1 PUFF BY MOUTH EVERY 12 HOURS IN THE MORNING AND IN THE EVENING     Allergies:   Penicillins   Social History   Tobacco Use  . Smoking status: Never Smoker  . Smokeless tobacco: Never Used  Substance Use Topics  . Alcohol use: Never    Frequency: Never  . Drug use: Never     Family Hx: The patient's family history is not on file.  ROS:   Please see the history of present illness.    Review of Systems  Eyes: Negative for blurred vision.  Respiratory: Negative.  Negative for cough.   Cardiovascular: Negative.  Negative for chest pain.  Neurological: Negative for dizziness and tingling.  Psychiatric/Behavioral: Negative for confusion.    All other systems reviewed and are negative.  Labs/Other Tests and Data Reviewed:    Recent Labs: 02/06/2019: ALT 9; BUN 17; Creatinine, Ser 1.31; Potassium 4.2; Sodium 141   Recent Lipid Panel Lab Results  Component Value Date/Time   CHOL 141 02/06/2019 11:06 AM   TRIG 64 02/06/2019 11:06 AM   HDL 66 02/06/2019 11:06 AM   CHOLHDL 2.1 02/06/2019 11:06 AM   LDLCALC 62 02/06/2019 11:06 AM    Wt Readings from Last 3 Encounters:  02/15/19 105 lb (47.6 kg)  02/05/19 109 lb 4.8 oz (49.6 kg)  08/04/18 115 lb 3.2 oz (52.3 kg)     Exam:     Vital Signs:  BP 128/60 (BP Location: Right Arm, Patient Position: Sitting, Cuff Size: Small)   Wt 109 lb 4.8 oz (49.6 kg)   BMI 20.65 kg/m     Physical Exam  Constitutional: She is oriented to person, place, and time. No distress.  Neurological: She is alert and oriented to person, place, and time.  Psychiatric: Mood, memory, affect and judgment normal.    ASSESSMENT & PLAN:    1. Prediabetes  Chronic, stable  No current medications  Encouraged to limit intake of sugary foods and drinks  Encouraged to increase physical activity to 150 minutes per week - Hemoglobin A1c - Lipid panel  2. Essential hypertension . B/P is controlled.  . CMP ordered to check renal function.  . The importance of regular exercise and dietary modification was stressed to the patient.  - CMP14 + Anion Gap  3. Decreased appetite  Encouraged to drink a protein shake daily   She needs to follow up with Dr. Wilburn Cornelia for the results of her thyroid biopsy  I will also order a CT scan lungs low dose due to history of smoking, quit last year.    4. Right thyroid nodule  She has been seen by the ENT and had biopsy which she is unsure of the results   COVID-19 Education: The signs and symptoms of COVID-19 were discussed with the patient and how to seek care for testing (follow up with PCP or arrange E-visit).  The importance of social distancing was discussed today.  Patient Risk:   After full review of this patients clinical status, I feel that they are at least moderate risk at this time.  Time:   Today, I have spent 17 minutes/ seconds with the patient with telehealth technology discussing above diagnoses.     Medication Adjustments/Labs and Tests Ordered: Current medicines are reviewed at length with the patient today.  Concerns regarding medicines are outlined above.   Tests Ordered: Orders Placed This Encounter  Procedures  . CMP14 + Anion Gap  . Hemoglobin A1c  . Lipid panel     Medication Changes: No orders of the defined types were placed in this encounter.   Disposition:  Follow up in 3 month(s)  Signed, [name], FNP

## 2019-02-06 ENCOUNTER — Other Ambulatory Visit: Payer: Self-pay | Admitting: Nurse Practitioner

## 2019-02-06 ENCOUNTER — Other Ambulatory Visit: Payer: Self-pay

## 2019-02-06 ENCOUNTER — Other Ambulatory Visit: Payer: Medicare Other

## 2019-02-06 DIAGNOSIS — I10 Essential (primary) hypertension: Secondary | ICD-10-CM | POA: Diagnosis not present

## 2019-02-06 DIAGNOSIS — R7303 Prediabetes: Secondary | ICD-10-CM | POA: Diagnosis not present

## 2019-02-07 LAB — CMP14 + ANION GAP
ALT: 9 IU/L (ref 0–32)
AST: 16 IU/L (ref 0–40)
Albumin/Globulin Ratio: 1.5 (ref 1.2–2.2)
Albumin: 4 g/dL (ref 3.7–4.7)
Alkaline Phosphatase: 56 IU/L (ref 39–117)
Anion Gap: 14 mmol/L (ref 10.0–18.0)
BUN/Creatinine Ratio: 13 (ref 12–28)
BUN: 17 mg/dL (ref 8–27)
Bilirubin Total: 0.2 mg/dL (ref 0.0–1.2)
CO2: 28 mmol/L (ref 20–29)
Calcium: 10 mg/dL (ref 8.7–10.3)
Chloride: 99 mmol/L (ref 96–106)
Creatinine, Ser: 1.31 mg/dL — ABNORMAL HIGH (ref 0.57–1.00)
GFR calc Af Amer: 45 mL/min/{1.73_m2} — ABNORMAL LOW (ref >59)
GFR calc non Af Amer: 39 mL/min/{1.73_m2} — ABNORMAL LOW (ref >59)
Globulin, Total: 2.6 g/dL (ref 1.5–4.5)
Glucose: 88 mg/dL (ref 65–99)
Potassium: 4.2 mmol/L (ref 3.5–5.2)
Sodium: 141 mmol/L (ref 134–144)
Total Protein: 6.6 g/dL (ref 6.0–8.5)

## 2019-02-07 LAB — LIPID PANEL
Chol/HDL Ratio: 2.1 ratio (ref 0.0–4.4)
Cholesterol, Total: 141 mg/dL (ref 100–199)
HDL: 66 mg/dL (ref >39)
LDL Calculated: 62 mg/dL (ref 0–99)
Triglycerides: 64 mg/dL (ref 0–149)
VLDL Cholesterol Cal: 13 mg/dL (ref 5–40)

## 2019-02-07 LAB — HEMOGLOBIN A1C
Est. average glucose Bld gHb Est-mCnc: 120 mg/dL
Hgb A1c MFr Bld: 5.8 % — ABNORMAL HIGH (ref 4.8–5.6)

## 2019-02-15 ENCOUNTER — Encounter: Payer: Self-pay | Admitting: Nurse Practitioner

## 2019-02-15 ENCOUNTER — Ambulatory Visit (INDEPENDENT_AMBULATORY_CARE_PROVIDER_SITE_OTHER): Payer: Medicare Other | Admitting: Nurse Practitioner

## 2019-02-15 ENCOUNTER — Other Ambulatory Visit: Payer: Self-pay

## 2019-02-15 ENCOUNTER — Other Ambulatory Visit: Payer: Self-pay | Admitting: Nurse Practitioner

## 2019-02-15 VITALS — BP 130/60 | HR 79 | Temp 98.2°F | Ht 61.8 in | Wt 105.0 lb

## 2019-02-15 DIAGNOSIS — M25511 Pain in right shoulder: Secondary | ICD-10-CM | POA: Diagnosis not present

## 2019-02-15 MED ORDER — DICLOFENAC SODIUM 1 % TD GEL: 2.0000 g | Freq: Four times a day (QID) | TRANSDERMAL | 1 refills | Status: DC

## 2019-02-15 NOTE — Patient Instructions (Signed)

## 2019-02-15 NOTE — Progress Notes (Signed)
Subjective:     Patient ID: Leslie Reeves , female    DOB: Nov 13, 1940 , 78 y.o.   MRN: [id number]   Chief Complaint  Patient presents with  . Shoulder Pain    patient states her shoulder has been bothering her since sunday. patient states the pain is achy and radiates down to her arm a little above her elbow on the right side.    HPI  Shoulder Pain   The pain is present in the right shoulder. This is a new problem. The current episode started in the past 7 days. The problem occurs constantly. The problem has been unchanged. The quality of the pain is described as aching. The pain is at a severity of 7/10. The pain is moderate. Pertinent negatives include no fever or numbness. She has tried acetaminophen, heat and cold for the symptoms. Family history does not include gout. There is no history of diabetes, osteoarthritis or rheumatoid arthritis.     Past Medical History:  Diagnosis Date  . Allergy   . Diabetes mellitus without complication ([place])   . Hypertension      No family history on file.   Current Outpatient Medications:  .  albuterol (VENTOLIN HFA) 108 (90 Base) MCG/ACT inhaler, INHALE TWO TIMES BY MOUTH FOUR TIMES PER DAY, Disp: 8.5 Inhaler, Rfl: 0 .  amLODipine (NORVASC) 10 MG tablet, TAKE 1 TABLET BY MOUTH EVERY DAY, Disp: 90 tablet, Rfl: 1 .  blood glucose meter kit and supplies KIT, Dispense based on patient and insurance preference. Use up to four times daily as directed. (FOR ICD-9 250.00, 250.01)., Disp: 1 each, Rfl: 0 .  glucose blood (ACCU-CHEK AVIVA PLUS) test strip, Use as instructed, Disp: 300 each, Rfl: 3 .  metFORMIN (GLUCOPHAGE) 500 MG tablet, TAKE 1/2 TABLET TWICE A DAY, Disp: 90 tablet, Rfl: 5 .  olmesartan-hydrochlorothiazide (BENICAR HCT) 20-12.5 MG tablet, TAKE ONE TABLET BY MOUTH DAILY, Disp: 90 tablet, Rfl: 0 .  rosuvastatin (CRESTOR) 20 MG tablet, TAKE 1 TABLET BY MOUTH EVERY DAY, Disp: 90 tablet, Rfl: 0 .  Vitamin D, Ergocalciferol, (DRISDOL) 1.25 MG  (50000 UT) CAPS capsule, TAKE ONE TWICE A WEEK, Disp: 32 capsule, Rfl: 0 .  WIXELA INHUB 100-50 MCG/DOSE AEPB, TAKE 1 PUFF BY MOUTH EVERY 12 HOURS IN THE MORNING AND IN THE EVENING, Disp: 180 each, Rfl: 1   Allergies  Allergen Reactions  . Penicillins Rash     Review of Systems  Constitutional: Negative for fatigue and fever.  Cardiovascular: Negative.  Negative for chest pain, palpitations and leg swelling.  Endocrine: Negative for polydipsia, polyphagia and polyuria.  Musculoskeletal: Positive for arthralgias (right shoulder pain).  Neurological: Negative for dizziness, numbness and headaches.     Today's Vitals   02/15/19 1004  BP: 130/60  Pulse: 79  Temp: 98.2 F (36.8 C)  TempSrc: Oral  Weight: 105 lb (47.6 kg)  Height: 5' 1.8" (1.57 m)  PainSc: 6   PainLoc: Shoulder   Body mass index is 19.33 kg/m.   Objective:  Physical Exam Constitutional:      Appearance: She is well-developed.  Cardiovascular:     Rate and Rhythm: Normal rate and regular rhythm.     Pulses: Normal pulses.     Heart sounds: Normal heart sounds.  Pulmonary:     Effort: Pulmonary effort is normal.     Breath sounds: No decreased breath sounds.  Musculoskeletal:        General: Tenderness (right shoulder) present. No swelling, deformity  or signs of injury.  Skin:    General: Skin is warm and dry.     Capillary Refill: Capillary refill takes less than 2 seconds.  Neurological:     General: No focal deficit present.     Mental Status: She is alert and oriented to person, place, and time.  Psychiatric:        Mood and Affect: Mood normal.        Behavior: Behavior normal.        Thought Content: Thought content normal.        Judgment: Judgment normal.         Assessment And Plan:     1. Acute pain of right shoulder  Tenderness to right lateral shoulder  Will try diclofenac gel and send for xray  If not better will consider referral to PT - diclofenac sodium (VOLTAREN) 1 % GEL;  Apply 2 g topically 4 (four) times daily.  Dispense: 100 g; Refill: 1 - DG Shoulder Right; Future   [name], FNP    THE PATIENT IS ENCOURAGED TO PRACTICE SOCIAL DISTANCING DUE TO THE COVID-19 PANDEMIC.

## 2019-02-19 ENCOUNTER — Encounter: Payer: Self-pay | Admitting: Nurse Practitioner

## 2019-02-20 ENCOUNTER — Ambulatory Visit
Admission: RE | Admit: 2019-02-20 | Discharge: 2019-02-20 | Disposition: A | Discharge status: Home / Self Care | Payer: Medicare Other | Source: Ambulatory Visit | Attending: Nurse Practitioner | Admitting: Nurse Practitioner

## 2019-02-20 DIAGNOSIS — M25511 Pain in right shoulder: Secondary | ICD-10-CM

## 2019-02-23 ENCOUNTER — Other Ambulatory Visit: Payer: Self-pay | Admitting: Internal Medicine

## 2019-02-23 ENCOUNTER — Encounter: Payer: Self-pay | Admitting: Nurse Practitioner

## 2019-02-25 ENCOUNTER — Other Ambulatory Visit: Payer: Self-pay | Admitting: Nurse Practitioner

## 2019-03-05 ENCOUNTER — Telehealth: Payer: Self-pay | Admitting: Internal Medicine

## 2019-03-05 NOTE — Telephone Encounter (Signed)
I called the patient to reschedule her AWV, but there was no answer and no option to leave a message. VDM (DD)

## 2019-03-07 NOTE — Telephone Encounter (Signed)
I spoke with the patient and gave her the option of coming into office for AWV or virtual AWV.  She preferred to do it virtually. VDM (DD)

## 2019-03-09 ENCOUNTER — Other Ambulatory Visit: Payer: Self-pay | Admitting: Nurse Practitioner

## 2019-03-13 ENCOUNTER — Ambulatory Visit: Payer: Self-pay

## 2019-03-21 ENCOUNTER — Telehealth: Payer: Self-pay

## 2019-03-21 ENCOUNTER — Ambulatory Visit (INDEPENDENT_AMBULATORY_CARE_PROVIDER_SITE_OTHER): Payer: Medicare Other

## 2019-03-21 ENCOUNTER — Other Ambulatory Visit: Payer: Self-pay | Admitting: Internal Medicine

## 2019-03-21 ENCOUNTER — Other Ambulatory Visit: Payer: Self-pay

## 2019-03-21 VITALS — BP 116/58 | Ht 61.5 in | Wt 106.0 lb

## 2019-03-21 DIAGNOSIS — Z0000 Encounter for general adult medical examination without abnormal findings: Secondary | ICD-10-CM

## 2019-03-21 DIAGNOSIS — Z23 Encounter for immunization: Secondary | ICD-10-CM | POA: Diagnosis not present

## 2019-03-21 MED ORDER — PREVNAR 13 IM SUSP: 0.5000 mL | INTRAMUSCULAR | 0 refills | Status: AC

## 2019-03-21 NOTE — Progress Notes (Signed)
Subjective:   Leslie Reeves is a 78 y.o. female who presents for Medicare Annual (Subsequent) preventive examination.  This visit type was conducted due to national recommendations for restrictions regarding the COVID-19 Pandemic (e.g. social distancing). This format is felt to be most appropriate for this patient at this time. All issues noted in this document were discussed and addressed. No physical exam was performed (except for noted visual exam findings with Video Visits). This patient, [name], has given permission to perform this visit via telephone. Vital signs may be absent or patient reported.  Patient location:  At home  Nurse location:  [place] office     Review of Systems:  n/a Cardiac Risk Factors include: advanced age (>42mn, >>88women);hypertension;diabetes mellitus     Objective:     Vitals: BP (!) 116/58 Comment: per patient  Ht 5' 1.5" (1.562 m) Comment: per patient  Wt 106 lb (48.1 kg) Comment: per patient  BMI 19.70 kg/m   Body mass index is 19.7 kg/m.  Advanced Directives 03/21/2019  Does Patient Have a Medical Advance Directive? No    Tobacco Social History   Tobacco Use  Smoking Status Former Smoker  . Packs/day: 0.50  . Years: 15.00  . Pack years: 7.50  . Types: Cigarettes  . Quit date: 2017  . Years since quitting: 3.4  Smokeless Tobacco Never Used     Counseling given: Not Answered   Clinical Intake:  Pre-visit preparation completed: Yes  Pain : No/denies pain Pain Score: 0-No pain     Nutritional Status: BMI of 19-24  Normal Nutritional Risks: None Diabetes: Yes CBG done?: No Did pt. bring in CBG monitor from home?: No  How often do you need to have someone help you when you read instructions, pamphlets, or other written materials from your doctor or pharmacy?: 1 - Never What is the last grade level you completed in school?: 12th grade  Interpreter Needed?: No  Information entered by :: NAllen LPN  Past  Medical History:  Diagnosis Date  . Allergy   . Diabetes mellitus without complication ([facility]   . Hypertension    Past Surgical History:  Procedure Laterality Date  . ABDOMINAL HYSTERECTOMY     History reviewed. No pertinent family history. Social History   Socioeconomic History  . Marital status: Married    Spouse name: Not on file  . Number of children: Not on file  . Years of education: Not on file  . Highest education level: Not on file  Occupational History  . Occupation: retired  SScientific laboratory technician . Financial resource strain: Not on file  . Food insecurity    Worry: Never true    Inability: Never true  . Transportation needs    Medical: No    Non-medical: No  Tobacco Use  . Smoking status: Former Smoker    Packs/day: 0.50    Years: 15.00    Pack years: 7.50    Types: Cigarettes    Quit date: 2017    Years since quitting: 3.4  . Smokeless tobacco: Never Used  Substance and Sexual Activity  . Alcohol use: Not Currently    Frequency: Never  . Drug use: Not Currently  . Sexual activity: Not Currently  Lifestyle  . Physical activity    Days per week: 3 days    Minutes per session: 30 min  . Stress: Not at all  Relationships  . Social connections    Talks on phone: Not on file  Gets together: Not on file    Attends religious service: Not on file    Active member of club or organization: Not on file    Attends meetings of clubs or organizations: Not on file    Relationship status: Not on file  Other Topics Concern  . Not on file  Social History Narrative  . Not on file    Outpatient Encounter Medications as of 03/21/2019  Medication Sig  . albuterol (VENTOLIN HFA) 108 (90 Base) MCG/ACT inhaler INHALE TWO TIMES BY MOUTH FOUR TIMES PER DAY  . amLODipine (NORVASC) 10 MG tablet TAKE 1 TABLET BY MOUTH EVERY DAY  . blood glucose meter kit and supplies KIT Dispense based on patient and insurance preference. Use up to four times daily as directed. (FOR ICD-9  250.00, 250.01).  [name] diclofenac sodium (VOLTAREN) 1 % GEL Apply 2 g topically 4 (four) times daily.  [name] glucose blood (ACCU-CHEK AVIVA PLUS) test strip Use as instructed  . levothyroxine (SYNTHROID) 25 MCG tablet TAKE 1.5 TABLET (37.5MCG) BY ORAL ROUTE EVERY DAY  . metFORMIN (GLUCOPHAGE) 500 MG tablet TAKE 1/2 TABLET TWICE A DAY  . olmesartan-hydrochlorothiazide (BENICAR HCT) 20-12.5 MG tablet TAKE ONE TABLET BY MOUTH DAILY  . rosuvastatin (CRESTOR) 20 MG tablet TAKE 1 TABLET BY MOUTH EVERY DAY  . Vitamin D, Ergocalciferol, (DRISDOL) 1.25 MG (50000 UT) CAPS capsule TAKE ONE TWICE A WEEK  . WIXELA INHUB 100-50 MCG/DOSE AEPB TAKE 1 PUFF BY MOUTH EVERY 12 HOURS IN THE MORNING AND IN THE EVENING  . pneumococcal 13-valent conjugate vaccine (PREVNAR 13) SUSP injection Inject 0.5 mLs into the muscle tomorrow at 10 am for 1 dose.   No facility-administered encounter medications on file as of 03/21/2019.     Activities of Daily Living In your present state of health, do you have any difficulty performing the following activities: 03/21/2019  Hearing? Y  Comment sometimes asks people to repeat  Vision? N  Difficulty concentrating or making decisions? N  Walking or climbing stairs? N  Dressing or bathing? N  Doing errands, shopping? N  Preparing Food and eating ? N  Using the Toilet? N  In the past six months, have you accidently leaked urine? Y  Do you have problems with loss of bowel control? N  Managing your Medications? N  Managing your Finances? N  Housekeeping or managing your Housekeeping? N  Some recent data might be hidden    Patient Care Team: Glendale Chard, MD as PCP - General (Internal Medicine)    Assessment:   This is a routine wellness examination for Leslie Reeves.  Exercise Activities and Dietary recommendations Current Exercise Habits: Home exercise routine, Type of exercise: walking, Time (Minutes): 30, Frequency (Times/Week): 3, Weekly Exercise (Minutes/Week): 90  Goals    .  Patient Stated     No goals       Fall Risk Fall Risk  03/21/2019 02/05/2019 08/04/2018  Falls in the past year? 0 0 0  Risk for fall due to : Medication side effect - -  Follow up Falls evaluation completed;Falls prevention discussed - -   Is the patient's home free of loose throw rugs in walkways, pet beds, electrical cords, etc?   yes      Grab bars in the bathroom? no      Handrails on the stairs?   n/a      Adequate lighting?   yes  Timed Get Up and Go performed: n/a  Depression Screen PHQ 2/9 Scores 03/21/2019 02/05/2019  08/04/2018  PHQ - 2 Score 0 0 0  PHQ- 9 Score 3 - -     Cognitive Function     6CIT Screen 03/21/2019  What Year? 0 points  What month? 0 points  What time? 0 points  Count back from 20 0 points  Months in reverse 0 points  Repeat phrase 2 points  Total Score 2    Immunization History  Administered Date(s) Administered  . Influenza,inj,Quad PF,6+ Mos 07/06/2018  . Influenza-Unspecified 06/27/2013    Qualifies for Shingles Vaccine? yes  Screening Tests Health Maintenance  Topic Date Due  . PNA vac Low Risk Adult (1 of 2 - PCV13) 01/27/2006  . TETANUS/TDAP  03/20/2020 (Originally 01/28/1960)  . INFLUENZA VACCINE  04/28/2019  . DEXA SCAN  Completed    Cancer Screenings: Lung: Low Dose CT Chest recommended if Age 76-80 years, 30 pack-year currently smoking OR have quit w/in 15years. Patient does not qualify. Breast:  Up to date on Mammogram? Yes   Up to date of Bone Density/Dexa? Yes Colorectal: up to date  Additional Screenings: : Hepatitis C Screening: n/a     Plan:    6 CIT was 2. Prevnar 13 sent to pharmacy. No goals set.  I have personally reviewed and noted the following in the patient's chart:   . Medical and social history . Use of alcohol, tobacco or illicit drugs  . Current medications and supplements . Functional ability and status . Nutritional status . Physical activity . Advanced directives . List of other physicians  . Hospitalizations, surgeries, and ER visits in previous 12 months . Vitals . Screenings to include cognitive, depression, and falls . Referrals and appointments  In addition, I have reviewed and discussed with patient certain preventive protocols, quality metrics, and best practice recommendations. A written personalized care plan for preventive services as well as general preventive health recommendations were provided to patient.     Kellie Simmering, LPN  [id number]

## 2019-03-21 NOTE — Telephone Encounter (Signed)
Patient called and confirmed her appointment today. YRL,RMA

## 2019-03-21 NOTE — Patient Instructions (Signed)
Leslie Reeves , Thank you for taking time to come for your Medicare Wellness Visit. I appreciate your ongoing commitment to your health goals. Please review the following plan we discussed and let me know if I can assist you in the future.   Screening recommendations/referrals: Colonoscopy: 12/2017 Mammogram: 06/2018 Bone Density: 06/2018 Recommended yearly ophthalmology/optometry visit for glaucoma screening and checkup Recommended yearly dental visit for hygiene and checkup  Vaccinations: Influenza vaccine: 06/2018 Pneumococcal vaccine: sent to pharmacy Tdap vaccine: states had within 10 years Shingles vaccine: discussed    Advanced directives: Advance directive discussed with you today.   Conditions/risks identified: none  Next appointment: 05/09/2019 at 11:00   Preventive Care 65 Years and Older, Female Preventive care refers to lifestyle choices and visits with your health care provider that can promote health and wellness. What does preventive care include?  A yearly physical exam. This is also called an annual well check.  Dental exams once or twice a year.  Routine eye exams. Ask your health care provider how often you should have your eyes checked.  Personal lifestyle choices, including:  Daily care of your teeth and gums.  Regular physical activity.  Eating a healthy diet.  Avoiding tobacco and drug use.  Limiting alcohol use.  Practicing safe sex.  Taking low-dose aspirin every day.  Taking vitamin and mineral supplements as recommended by your health care provider. What happens during an annual well check? The services and screenings done by your health care provider during your annual well check will depend on your age, overall health, lifestyle risk factors, and family history of disease. Counseling  Your health care provider may ask you questions about your:  Alcohol use.  Tobacco use.  Drug use.  Emotional well-being.  Home and relationship  well-being.  Sexual activity.  Eating habits.  History of falls.  Memory and ability to understand (cognition).  Work and work Statistician.  Reproductive health. Screening  You may have the following tests or measurements:  Height, weight, and BMI.  Blood pressure.  Lipid and cholesterol levels. These may be checked every 5 years, or more frequently if you are over 20 years old.  Skin check.  Lung cancer screening. You may have this screening every year starting at age 5 if you have a 30-pack-year history of smoking and currently smoke or have quit within the past 15 years.  Fecal occult blood test (FOBT) of the stool. You may have this test every year starting at age 13.  Flexible sigmoidoscopy or colonoscopy. You may have a sigmoidoscopy every 5 years or a colonoscopy every 10 years starting at age 49.  Hepatitis C blood test.  Hepatitis B blood test.  Sexually transmitted disease (STD) testing.  Diabetes screening. This is done by checking your blood sugar (glucose) after you have not eaten for a while (fasting). You may have this done every 1-3 years.  Bone density scan. This is done to screen for osteoporosis. You may have this done starting at age 30.  Mammogram. This may be done every 1-2 years. Talk to your health care provider about how often you should have regular mammograms. Talk with your health care provider about your test results, treatment options, and if necessary, the need for more tests. Vaccines  Your health care provider may recommend certain vaccines, such as:  Influenza vaccine. This is recommended every year.  Tetanus, diphtheria, and acellular pertussis (Tdap, Td) vaccine. You may need a Td booster every 10 years.  Zoster vaccine.  You may need this after age 29.  Pneumococcal 13-valent conjugate (PCV13) vaccine. One dose is recommended after age 89.  Pneumococcal polysaccharide (PPSV23) vaccine. One dose is recommended after age 34.  Talk to your health care provider about which screenings and vaccines you need and how often you need them. This information is not intended to replace advice given to you by your health care provider. Make sure you discuss any questions you have with your health care provider. Document Released: 10/10/2015 Document Revised: 06/02/2016 Document Reviewed: 07/15/2015 Elsevier Interactive Patient Education  2017 [place] Prevention in the Home Falls can cause injuries. They can happen to people of all ages. There are many things you can do to make your home safe and to help prevent falls. What can I do on the outside of my home?  Regularly fix the edges of walkways and driveways and fix any cracks.  Remove anything that might make you trip as you walk through a door, such as a raised step or threshold.  Trim any bushes or trees on the path to your home.  Use bright outdoor lighting.  Clear any walking paths of anything that might make someone trip, such as rocks or tools.  Regularly check to see if handrails are loose or broken. Make sure that both sides of any steps have handrails.  Any raised decks and porches should have guardrails on the edges.  Have any leaves, snow, or ice cleared regularly.  Use sand or salt on walking paths during winter.  Clean up any spills in your garage right away. This includes oil or grease spills. What can I do in the bathroom?  Use night lights.  Install grab bars by the toilet and in the tub and shower. Do not use towel bars as grab bars.  Use non-skid mats or decals in the tub or shower.  If you need to sit down in the shower, use a plastic, non-slip stool.  Keep the floor dry. Clean up any water that spills on the floor as soon as it happens.  Remove soap buildup in the tub or shower regularly.  Attach bath mats securely with double-sided non-slip rug tape.  Do not have throw rugs and other things on the floor that can make you  trip. What can I do in the bedroom?  Use night lights.  Make sure that you have a light by your bed that is easy to reach.  Do not use any sheets or blankets that are too big for your bed. They should not hang down onto the floor.  Have a firm chair that has side arms. You can use this for support while you get dressed.  Do not have throw rugs and other things on the floor that can make you trip. What can I do in the kitchen?  Clean up any spills right away.  Avoid walking on wet floors.  Keep items that you use a lot in easy-to-reach places.  If you need to reach something above you, use a strong step stool that has a grab bar.  Keep electrical cords out of the way.  Do not use floor polish or wax that makes floors slippery. If you must use wax, use non-skid floor wax.  Do not have throw rugs and other things on the floor that can make you trip. What can I do with my stairs?  Do not leave any items on the stairs.  Make sure that there are handrails on  both sides of the stairs and use them. Fix handrails that are broken or loose. Make sure that handrails are as long as the stairways.  Check any carpeting to make sure that it is firmly attached to the stairs. Fix any carpet that is loose or worn.  Avoid having throw rugs at the top or bottom of the stairs. If you do have throw rugs, attach them to the floor with carpet tape.  Make sure that you have a light switch at the top of the stairs and the bottom of the stairs. If you do not have them, ask someone to add them for you. What else can I do to help prevent falls?  Wear shoes that:  Do not have high heels.  Have rubber bottoms.  Are comfortable and fit you well.  Are closed at the toe. Do not wear sandals.  If you use a stepladder:  Make sure that it is fully opened. Do not climb a closed stepladder.  Make sure that both sides of the stepladder are locked into place.  Ask someone to hold it for you, if  possible.  Clearly mark and make sure that you can see:  Any grab bars or handrails.  First and last steps.  Where the edge of each step is.  Use tools that help you move around (mobility aids) if they are needed. These include:  Canes.  Walkers.  Scooters.  Crutches.  Turn on the lights when you go into a dark area. Replace any light bulbs as soon as they burn out.  Set up your furniture so you have a clear path. Avoid moving your furniture around.  If any of your floors are uneven, fix them.  If there are any pets around you, be aware of where they are.  Review your medicines with your doctor. Some medicines can make you feel dizzy. This can increase your chance of falling. Ask your doctor what other things that you can do to help prevent falls. This information is not intended to replace advice given to you by your health care provider. Make sure you discuss any questions you have with your health care provider. Document Released: 07/10/2009 Document Revised: 02/19/2016 Document Reviewed: 10/18/2014 Elsevier Interactive Patient Education  2017 [employer].

## 2019-05-01 ENCOUNTER — Other Ambulatory Visit: Payer: Self-pay | Admitting: Internal Medicine

## 2019-05-02 ENCOUNTER — Other Ambulatory Visit: Payer: Self-pay | Admitting: Nurse Practitioner

## 2019-05-09 ENCOUNTER — Ambulatory Visit: Payer: Self-pay

## 2019-05-09 ENCOUNTER — Ambulatory Visit: Payer: Self-pay | Admitting: Nurse Practitioner

## 2019-05-16 ENCOUNTER — Telehealth: Payer: Self-pay

## 2019-05-16 NOTE — Telephone Encounter (Signed)
Called to reschedule appt missed, no answer, no VM 05/16/2019

## 2019-05-28 ENCOUNTER — Other Ambulatory Visit: Payer: Self-pay | Admitting: Internal Medicine

## 2019-05-28 DIAGNOSIS — Z1231 Encounter for screening mammogram for malignant neoplasm of breast: Secondary | ICD-10-CM

## 2019-06-02 DIAGNOSIS — E119 Type 2 diabetes mellitus without complications: Secondary | ICD-10-CM | POA: Diagnosis not present

## 2019-06-02 LAB — HM DIABETES EYE EXAM

## 2019-06-06 ENCOUNTER — Encounter: Payer: Self-pay | Admitting: Internal Medicine

## 2019-06-06 ENCOUNTER — Other Ambulatory Visit: Payer: Self-pay | Admitting: Nurse Practitioner

## 2019-06-12 DIAGNOSIS — I129 Hypertensive chronic kidney disease with stage 1 through stage 4 chronic kidney disease, or unspecified chronic kidney disease: Secondary | ICD-10-CM | POA: Diagnosis not present

## 2019-06-12 DIAGNOSIS — N183 Chronic kidney disease, stage 3 (moderate): Secondary | ICD-10-CM | POA: Diagnosis not present

## 2019-06-12 DIAGNOSIS — E559 Vitamin D deficiency, unspecified: Secondary | ICD-10-CM | POA: Diagnosis not present

## 2019-06-12 DIAGNOSIS — E1122 Type 2 diabetes mellitus with diabetic chronic kidney disease: Secondary | ICD-10-CM | POA: Diagnosis not present

## 2019-06-13 ENCOUNTER — Other Ambulatory Visit: Payer: Self-pay | Admitting: Nurse Practitioner

## 2019-06-13 ENCOUNTER — Other Ambulatory Visit: Payer: Self-pay | Admitting: Internal Medicine

## 2019-06-27 ENCOUNTER — Encounter: Payer: Medicare Other | Admitting: Nurse Practitioner

## 2019-07-06 ENCOUNTER — Other Ambulatory Visit: Payer: Self-pay | Admitting: Nurse Practitioner

## 2019-07-09 ENCOUNTER — Other Ambulatory Visit: Payer: Self-pay | Admitting: Nurse Practitioner

## 2019-07-09 ENCOUNTER — Other Ambulatory Visit: Payer: Self-pay | Admitting: Internal Medicine

## 2019-07-10 ENCOUNTER — Other Ambulatory Visit: Payer: Self-pay | Admitting: Nurse Practitioner

## 2019-07-13 ENCOUNTER — Other Ambulatory Visit: Payer: Self-pay

## 2019-07-13 ENCOUNTER — Ambulatory Visit
Admission: RE | Admit: 2019-07-13 | Discharge: 2019-07-13 | Disposition: A | Discharge status: Home / Self Care | Payer: Medicare Other | Source: Ambulatory Visit | Attending: Internal Medicine | Admitting: Internal Medicine

## 2019-07-13 DIAGNOSIS — Z1231 Encounter for screening mammogram for malignant neoplasm of breast: Secondary | ICD-10-CM | POA: Diagnosis not present

## 2019-09-13 DIAGNOSIS — E678 Other specified hyperalimentation: Secondary | ICD-10-CM | POA: Diagnosis not present

## 2019-09-13 DIAGNOSIS — E559 Vitamin D deficiency, unspecified: Secondary | ICD-10-CM | POA: Diagnosis not present

## 2019-09-18 ENCOUNTER — Other Ambulatory Visit: Payer: Self-pay | Admitting: Nurse Practitioner

## 2019-10-30 ENCOUNTER — Telehealth: Payer: Self-pay

## 2019-10-30 ENCOUNTER — Other Ambulatory Visit: Payer: Self-pay | Admitting: Nurse Practitioner

## 2019-10-30 NOTE — Telephone Encounter (Signed)
Medication refill denied pt needs to schedule an appt. No VM available to leave message

## 2019-11-09 ENCOUNTER — Other Ambulatory Visit: Payer: Self-pay | Admitting: Nurse Practitioner

## 2019-11-10 ENCOUNTER — Other Ambulatory Visit: Payer: Self-pay | Admitting: Nurse Practitioner

## 2019-11-17 ENCOUNTER — Ambulatory Visit: Payer: Medicare Other | Attending: Internal Medicine

## 2019-11-17 DIAGNOSIS — Z23 Encounter for immunization: Secondary | ICD-10-CM | POA: Insufficient documentation

## 2019-11-17 NOTE — Progress Notes (Signed)
Covid-19 Vaccination Clinic  Name:  Leslie Reeves    MRN: [id number] DOB: April 12, 1941  11/17/2019  Leslie Reeves was observed post Covid-19 immunization for 15 minutes without incidence. She was provided with Vaccine Information Sheet and instruction to access the V-Safe system.   Leslie Reeves was instructed to call 911 with any severe reactions post vaccine: [name] Difficulty breathing  . Swelling of your face and throat  . A fast heartbeat  . A bad rash all over your body  . Dizziness and weakness    Immunizations Administered    Name Date Dose VIS Date Route   Pfizer COVID-19 Vaccine 11/17/2019  8:54 AM 0.3 mL 09/07/2019 Intramuscular   Manufacturer: [place]   Lot: [id number]   [place]: [id number]

## 2019-11-19 ENCOUNTER — Other Ambulatory Visit: Payer: Self-pay

## 2019-11-19 MED ORDER — METFORMIN HCL 500 MG PO TABS: 250.0000 mg | ORAL_TABLET | Freq: Two times a day (BID) | ORAL | 0 refills | Status: DC

## 2019-11-20 ENCOUNTER — Telehealth: Payer: Self-pay

## 2019-11-20 ENCOUNTER — Other Ambulatory Visit: Payer: Self-pay | Admitting: Nurse Practitioner

## 2019-11-20 NOTE — Telephone Encounter (Signed)
Returned pt call to schedule pt appt, no answer no vm

## 2019-11-20 NOTE — Telephone Encounter (Signed)
Left message w/pt daughter for pt to call the office to schedule an appt.

## 2019-11-21 ENCOUNTER — Ambulatory Visit (INDEPENDENT_AMBULATORY_CARE_PROVIDER_SITE_OTHER): Payer: Medicare Other | Admitting: Nurse Practitioner

## 2019-11-21 ENCOUNTER — Other Ambulatory Visit: Payer: Self-pay

## 2019-11-21 ENCOUNTER — Encounter: Payer: Self-pay | Admitting: Nurse Practitioner

## 2019-11-21 VITALS — BP 132/72 | HR 72 | Temp 98.0°F | Ht 61.6 in | Wt 97.6 lb

## 2019-11-21 DIAGNOSIS — Z87891 Personal history of nicotine dependence: Secondary | ICD-10-CM

## 2019-11-21 DIAGNOSIS — I10 Essential (primary) hypertension: Secondary | ICD-10-CM

## 2019-11-21 DIAGNOSIS — E041 Nontoxic single thyroid nodule: Secondary | ICD-10-CM

## 2019-11-21 DIAGNOSIS — R7303 Prediabetes: Secondary | ICD-10-CM

## 2019-11-21 DIAGNOSIS — J42 Unspecified chronic bronchitis: Secondary | ICD-10-CM

## 2019-11-21 DIAGNOSIS — R630 Anorexia: Secondary | ICD-10-CM

## 2019-11-21 MED ORDER — MIRTAZAPINE 7.5 MG PO TABS: 7.5000 mg | ORAL_TABLET | Freq: Every day | ORAL | 2 refills | Status: DC

## 2019-11-21 NOTE — Progress Notes (Signed)
This visit occurred during the SARS-CoV-2 public health emergency.  Safety protocols were in place, including screening questions prior to the visit, additional usage of staff PPE, and extensive cleaning of exam room while observing appropriate contact time as indicated for disinfecting solutions.  Subjective:     Patient ID: Leslie Reeves , female    DOB: 07-17-1941 , 79 y.o.   MRN: [id number]   Chief Complaint  Patient presents with  . Hypertension  . Diabetes    HPI  Wt Readings from Last 3 Encounters: 11/21/19 : 97 lb 9.6 oz (44.3 kg) 03/21/19 : 106 lb (48.1 kg) 02/15/19 : 105 lb (47.6 kg)    Hypertension This is a chronic problem. The current episode started more than 1 year ago. The problem is controlled. Pertinent negatives include no blurred vision or chest pain. There are no associated agents to hypertension. Past treatments include diuretics and ACE inhibitors. There are no compliance problems.  There is no history of angina.  Diabetes She presents for her follow-up diabetic visit. Diabetes type: prediabetes. Her disease course has been stable. Pertinent negatives for hypoglycemia include no confusion or dizziness. Pertinent negatives for diabetes include no blurred vision and no chest pain. There are no hypoglycemic complications. Symptoms are stable. There are no diabetic complications. Risk factors for coronary artery disease include sedentary lifestyle. She is compliant with treatment all of the time.     Past Medical History:  Diagnosis Date  . Allergy   . Diabetes mellitus without complication ([place])   . Hypertension      Family History  Problem Relation Age of Onset  . Breast cancer Neg Hx      Current Outpatient Medications:  .  albuterol (VENTOLIN HFA) 108 (90 Base) MCG/ACT inhaler, INHALE 2 PUFFS BY MOUTH 4 TIMES A DAY, Disp: 18 g, Rfl: 1 .  amLODipine (NORVASC) 10 MG tablet, TAKE 1 TABLET BY MOUTH EVERY DAY, Disp: 90 tablet, Rfl: 1 .  blood glucose  meter kit and supplies KIT, Dispense based on patient and insurance preference. Use up to four times daily as directed. (FOR ICD-9 250.00, 250.01)., Disp: 1 each, Rfl: 0 .  diclofenac sodium (VOLTAREN) 1 % GEL, Apply 2 g topically 4 (four) times daily., Disp: 100 g, Rfl: 1 .  glucose blood (ACCU-CHEK AVIVA PLUS) test strip, Use as instructed, Disp: 300 each, Rfl: 3 .  levothyroxine (SYNTHROID) 25 MCG tablet, TAKE 1.5 TABLET (37.5MCG) BY ORAL ROUTE EVERY DAY, Disp: 135 tablet, Rfl: 0 .  metFORMIN (GLUCOPHAGE) 500 MG tablet, Take 0.5 tablets (250 mg total) by mouth 2 (two) times daily., Disp: 90 tablet, Rfl: 0 .  olmesartan-hydrochlorothiazide (BENICAR HCT) 20-12.5 MG tablet, Take 1 tablet by mouth daily. PATIENT NEEDS AN APPOINTMENT, Disp: 90 tablet, Rfl: 0 .  rosuvastatin (CRESTOR) 20 MG tablet, TAKE 1 TABLET BY MOUTH EVERY DAY, Disp: 90 tablet, Rfl: 0 .  Vitamin D, Ergocalciferol, (DRISDOL) 1.25 MG (50000 UT) CAPS capsule, TAKE ONE TWICE A WEEK, Disp: 24 capsule, Rfl: 1 .  WIXELA INHUB 100-50 MCG/DOSE AEPB, TAKE 1 PUFF BY MOUTH EVERY 12 HOURS IN THE MORNING AND IN THE EVENING, Disp: 180 each, Rfl: 1   Allergies  Allergen Reactions  . Penicillins Rash     Review of Systems  Eyes: Negative for blurred vision.  Cardiovascular: Negative for chest pain.  Neurological: Negative for dizziness.  Psychiatric/Behavioral: Negative for confusion.     Today's Vitals   11/21/19 1016  BP: 132/72  Pulse: 72  Temp: 98 F (36.7 C)  TempSrc: Oral  Weight: 97 lb 9.6 oz (44.3 kg)  Height: 5' 1.6" (1.565 m)  PainSc: 0-No pain   Body mass index is 18.08 kg/m.   Objective:  Physical Exam Vitals reviewed.  Constitutional:      General: She is not in acute distress.    Appearance: Normal appearance. She is well-developed and normal weight.     Comments: thin  Cardiovascular:     Rate and Rhythm: Normal rate and regular rhythm.     Pulses: Normal pulses.     Heart sounds: Normal heart sounds. No  murmur.  Pulmonary:     Effort: Pulmonary effort is normal. No respiratory distress.     Breath sounds: Normal breath sounds.  Musculoskeletal:        General: Normal range of motion.  Skin:    General: Skin is warm and dry.     Capillary Refill: Capillary refill takes less than 2 seconds.  Neurological:     General: No focal deficit present.     Mental Status: She is alert and oriented to person, place, and time.     Cranial Nerves: No cranial nerve deficit.  Psychiatric:        Mood and Affect: Mood normal.        Behavior: Behavior normal.        Thought Content: Thought content normal.        Judgment: Judgment normal.         Assessment And Plan:     1. Prediabetes  Chronic, stable  Diet controlled - Lipid panel - Hemoglobin A1c  2. Essential hypertension  Chronic well controlled.  - CMP14+EGFR  3. Right thyroid nodule  Will recheck levels, has seen ENT for her nodule - TSH - T4 - T3, free  4. Chronic bronchitis, unspecified chronic bronchitis type ([place])  Continue with medications  She is also thin frame so she may likely have COPD she has not seen a lung specialist in the past  5. History of smoking  - CT CHEST LUNG CA SCREEN LOW DOSE W/O CM; Future  6. Decreased appetite  I have also ordered her a low dose CT scan due to her history of smoking and weight loss - 10 lbs since June 2020 - mirtazapine (REMERON) 7.5 MG tablet; Take 1 tablet (7.5 mg total) by mouth at bedtime.  Dispense: 30 tablet; Refill: 2  [name], FNP    THE PATIENT IS ENCOURAGED TO PRACTICE SOCIAL DISTANCING DUE TO THE COVID-19 PANDEMIC.

## 2019-11-22 LAB — CMP14+EGFR
ALT: 10 IU/L (ref 0–32)
AST: 23 IU/L (ref 0–40)
Albumin/Globulin Ratio: 1.7 (ref 1.2–2.2)
Albumin: 4.6 g/dL (ref 3.7–4.7)
Alkaline Phosphatase: 63 IU/L (ref 39–117)
BUN/Creatinine Ratio: 13 (ref 12–28)
BUN: 17 mg/dL (ref 8–27)
Bilirubin Total: 0.2 mg/dL (ref 0.0–1.2)
CO2: 24 mmol/L (ref 20–29)
Calcium: 9.8 mg/dL (ref 8.7–10.3)
Chloride: 99 mmol/L (ref 96–106)
Creatinine, Ser: 1.29 mg/dL — ABNORMAL HIGH (ref 0.57–1.00)
GFR calc Af Amer: 46 mL/min/{1.73_m2} — ABNORMAL LOW (ref >59)
GFR calc non Af Amer: 40 mL/min/{1.73_m2} — ABNORMAL LOW (ref >59)
Globulin, Total: 2.7 g/dL (ref 1.5–4.5)
Glucose: 97 mg/dL (ref 65–99)
Potassium: 3.9 mmol/L (ref 3.5–5.2)
Sodium: 137 mmol/L (ref 134–144)
Total Protein: 7.3 g/dL (ref 6.0–8.5)

## 2019-11-22 LAB — T3, FREE: T3, Free: 2.3 pg/mL (ref 2.0–4.4)

## 2019-11-22 LAB — LIPID PANEL
Chol/HDL Ratio: 2 ratio (ref 0.0–4.4)
Cholesterol, Total: 158 mg/dL (ref 100–199)
HDL: 78 mg/dL (ref >39)
LDL Chol Calc (NIH): 66 mg/dL (ref 0–99)
Triglycerides: 70 mg/dL (ref 0–149)
VLDL Cholesterol Cal: 14 mg/dL (ref 5–40)

## 2019-11-22 LAB — T4: T4, Total: 11.2 ug/dL (ref 4.5–12.0)

## 2019-11-22 LAB — HEMOGLOBIN A1C
Est. average glucose Bld gHb Est-mCnc: 111 mg/dL
Hgb A1c MFr Bld: 5.5 % (ref 4.8–5.6)

## 2019-11-22 LAB — TSH: TSH: 1.82 u[IU]/mL (ref 0.450–4.500)

## 2019-12-05 ENCOUNTER — Other Ambulatory Visit: Payer: Self-pay | Admitting: Nurse Practitioner

## 2019-12-05 ENCOUNTER — Other Ambulatory Visit: Payer: Self-pay

## 2019-12-05 MED ORDER — ROSUVASTATIN CALCIUM 20 MG PO TABS: 20.0000 mg | ORAL_TABLET | Freq: Every day | ORAL | 0 refills | Status: DC

## 2019-12-05 MED ORDER — VITAMIN D (ERGOCALCIFEROL) 1.25 MG (50000 UNIT) PO CAPS: ORAL_CAPSULE | ORAL | 1 refills | Status: DC

## 2019-12-06 ENCOUNTER — Other Ambulatory Visit: Payer: Self-pay

## 2019-12-06 MED ORDER — ROSUVASTATIN CALCIUM 20 MG PO TABS: 20.0000 mg | ORAL_TABLET | Freq: Every day | ORAL | 0 refills | Status: DC

## 2019-12-10 ENCOUNTER — Ambulatory Visit: Payer: Medicare Other | Attending: Internal Medicine

## 2019-12-10 DIAGNOSIS — Z23 Encounter for immunization: Secondary | ICD-10-CM

## 2019-12-10 NOTE — Progress Notes (Signed)
Covid-19 Vaccination Clinic  Name:  Leslie Reeves    MRN: [id number] DOB: 26-Nov-1940  12/10/2019  Leslie Reeves was observed post Covid-19 immunization for 15 minutes without incident. She was provided with Vaccine Information Sheet and instruction to access the V-Safe system.   Leslie Reeves was instructed to call 911 with any severe reactions post vaccine: [name] Difficulty breathing  . Swelling of face and throat  . A fast heartbeat  . A bad rash all over body  . Dizziness and weakness   Immunizations Administered    Name Date Dose VIS Date Route   Pfizer COVID-19 Vaccine 12/10/2019  8:20 AM 0.3 mL 09/07/2019 Intramuscular   Manufacturer: [place]   Lot: [id number]   [place]: [id number]

## 2019-12-11 ENCOUNTER — Ambulatory Visit: Payer: Medicare Other

## 2019-12-14 ENCOUNTER — Other Ambulatory Visit: Payer: Self-pay | Admitting: Nurse Practitioner

## 2019-12-14 DIAGNOSIS — R630 Anorexia: Secondary | ICD-10-CM

## 2019-12-18 ENCOUNTER — Ambulatory Visit
Admission: RE | Admit: 2019-12-18 | Discharge: 2019-12-18 | Disposition: A | Discharge status: Home / Self Care | Payer: Medicare Other | Source: Ambulatory Visit | Attending: Nurse Practitioner | Admitting: Nurse Practitioner

## 2019-12-18 ENCOUNTER — Other Ambulatory Visit: Payer: Self-pay

## 2019-12-18 DIAGNOSIS — R918 Other nonspecific abnormal finding of lung field: Secondary | ICD-10-CM | POA: Diagnosis not present

## 2019-12-18 DIAGNOSIS — Z87891 Personal history of nicotine dependence: Secondary | ICD-10-CM

## 2019-12-24 ENCOUNTER — Other Ambulatory Visit: Payer: Self-pay | Admitting: Nurse Practitioner

## 2020-01-02 ENCOUNTER — Encounter: Payer: Self-pay | Admitting: Nurse Practitioner

## 2020-01-02 ENCOUNTER — Ambulatory Visit (INDEPENDENT_AMBULATORY_CARE_PROVIDER_SITE_OTHER): Payer: Medicare Other | Admitting: Nurse Practitioner

## 2020-01-02 ENCOUNTER — Other Ambulatory Visit: Payer: Self-pay

## 2020-01-02 VITALS — BP 126/70 | HR 70 | Temp 98.3°F | Ht 61.6 in | Wt 95.4 lb

## 2020-01-02 DIAGNOSIS — R911 Solitary pulmonary nodule: Secondary | ICD-10-CM | POA: Diagnosis not present

## 2020-01-02 DIAGNOSIS — R630 Anorexia: Secondary | ICD-10-CM | POA: Diagnosis not present

## 2020-01-02 DIAGNOSIS — R6889 Other general symptoms and signs: Secondary | ICD-10-CM | POA: Diagnosis not present

## 2020-01-02 DIAGNOSIS — IMO0001 Reserved for inherently not codable concepts without codable children: Secondary | ICD-10-CM

## 2020-01-02 DIAGNOSIS — Z87891 Personal history of nicotine dependence: Secondary | ICD-10-CM | POA: Diagnosis not present

## 2020-01-02 DIAGNOSIS — Z79899 Other long term (current) drug therapy: Secondary | ICD-10-CM | POA: Diagnosis not present

## 2020-01-02 MED ORDER — MIRTAZAPINE 15 MG PO TABS: 15.0000 mg | ORAL_TABLET | Freq: Every day | ORAL | 1 refills | Status: DC

## 2020-01-02 NOTE — Progress Notes (Signed)
This visit occurred during the SARS-CoV-2 public health emergency.  Safety protocols were in place, including screening questions prior to the visit, additional usage of staff PPE, and extensive cleaning of exam room while observing appropriate contact time as indicated for disinfecting solutions.  Subjective:     Patient ID: Leslie Reeves , female    DOB: [birth year] , 79 y.o.   MRN: [id number]   Chief Complaint  Patient presents with  . med check    patient is here for a med check on mirtazapine. patient stated she is unable to tell the difference     HPI  Here to follow up on decreased appetite  Wt Readings from Last 3 Encounters: 01/02/20 : 95 lb 6.4 oz (43.3 kg) 11/21/19 : 97 lb 9.6 oz (44.3 kg) 03/21/19 : 106 lb (48.1 kg)  She has lost 10 lbs in 8 months    Past Medical History:  Diagnosis Date  . Allergy   . Diabetes mellitus without complication ([place])   . Hypertension      Family History  Problem Relation Age of Onset  . Breast cancer Neg Hx      Current Outpatient Medications:  .  amLODipine (NORVASC) 10 MG tablet, TAKE 1 TABLET BY MOUTH EVERY DAY, Disp: 90 tablet, Rfl: 1 .  blood glucose meter kit and supplies KIT, Dispense based on patient and insurance preference. Use up to four times daily as directed. (FOR ICD-9 250.00, 250.01)., Disp: 1 each, Rfl: 0 .  diclofenac sodium (VOLTAREN) 1 % GEL, Apply 2 g topically 4 (four) times daily., Disp: 100 g, Rfl: 1 .  glucose blood (ACCU-CHEK AVIVA PLUS) test strip, Use as instructed, Disp: 300 each, Rfl: 3 .  levothyroxine (SYNTHROID) 25 MCG tablet, TAKE 1.5 TABLET (37.5MCG) BY ORAL ROUTE EVERY DAY, Disp: 135 tablet, Rfl: 0 .  metFORMIN (GLUCOPHAGE) 500 MG tablet, Take 0.5 tablets (250 mg total) by mouth 2 (two) times daily., Disp: 90 tablet, Rfl: 0 .  mirtazapine (REMERON) 7.5 MG tablet, TAKE 1 TABLET (7.5 MG TOTAL) BY MOUTH AT BEDTIME., Disp: 90 tablet, Rfl: 1 .  olmesartan-hydrochlorothiazide (BENICAR HCT) 20-12.5 MG  tablet, Take 1 tablet by mouth daily. PATIENT NEEDS AN APPOINTMENT, Disp: 90 tablet, Rfl: 0 .  PROAIR HFA 108 (90 Base) MCG/ACT inhaler, INHALE 2 PUFFS BY MOUTH 4 TIMES A DAY, Disp: 8.5 g, Rfl: 1 .  rosuvastatin (CRESTOR) 20 MG tablet, Take 1 tablet (20 mg total) by mouth daily., Disp: 90 tablet, Rfl: 0 .  Vitamin D, Ergocalciferol, (DRISDOL) 1.25 MG (50000 UNIT) CAPS capsule, TAKE ONE TWICE A WEEK, Disp: 24 capsule, Rfl: 1 .  WIXELA INHUB 100-50 MCG/DOSE AEPB, TAKE 1 PUFF BY MOUTH EVERY 12 HOURS IN THE MORNING AND IN THE EVENING, Disp: 180 each, Rfl: 1   Allergies  Allergen Reactions  . Penicillins Rash     Review of Systems  Constitutional: Negative.  Negative for chills.  Respiratory: Negative.   Cardiovascular: Negative.  Negative for chest pain, palpitations and leg swelling.  Endocrine: Positive for cold intolerance.  Neurological: Negative for dizziness and headaches.  Psychiatric/Behavioral: Negative.      Today's Vitals   01/02/20 1025  BP: 126/70  Pulse: 70  Temp: 98.3 F (36.8 C)  TempSrc: Oral  Weight: 95 lb 6.4 oz (43.3 kg)  Height: 5' 1.6" (1.565 m)  PainSc: 0-No pain   Body mass index is 17.68 kg/m.   Objective:  Physical Exam Vitals reviewed.  Constitutional:  Appearance: Normal appearance.  Cardiovascular:     Rate and Rhythm: Normal rate and regular rhythm.     Pulses: Normal pulses.     Heart sounds: Normal heart sounds. No murmur.  Pulmonary:     Effort: Pulmonary effort is normal. No respiratory distress.     Breath sounds: Normal breath sounds.  Skin:    Capillary Refill: Capillary refill takes less than 2 seconds.  Neurological:     General: No focal deficit present.     Mental Status: She is alert and oriented to person, place, and time.     Cranial Nerves: No cranial nerve deficit.  Psychiatric:        Mood and Affect: Mood normal.        Behavior: Behavior normal.        Thought Content: Thought content normal.        Judgment:  Judgment normal.         Assessment And Plan:     1. Decreased appetite  She reports a mild improvement of her appetite but mostly at night.   Will increase dose  - mirtazapine (REMERON) 15 MG tablet; Take 1 tablet (15 mg total) by mouth at bedtime.  Dispense: 90 tablet; Refill: 1  2. Lung nodule < 6cm on CT  She has a 3 mm nodule to her left lung   Will refer to pulmonology due to increasing weight loss and 30 year Pack history of smoking.  - Ambulatory referral to Pulmonology  3. History of smoking   4. Sensation of feeling cold  Will check Hgb if low will add iron studies - CBC with Differential/Platelet   [name], FNP    THE PATIENT IS ENCOURAGED TO PRACTICE SOCIAL DISTANCING DUE TO THE COVID-19 PANDEMIC.

## 2020-01-03 LAB — CBC WITH DIFFERENTIAL/PLATELET
Basophils Absolute: 0.1 10*3/uL (ref 0.0–0.2)
Basos: 1 %
EOS (ABSOLUTE): 0.2 10*3/uL (ref 0.0–0.4)
Eos: 4 %
Hematocrit: 36.6 % (ref 34.0–46.6)
Hemoglobin: 12.3 g/dL (ref 11.1–15.9)
Immature Grans (Abs): 0 10*3/uL (ref 0.0–0.1)
Immature Granulocytes: 0 %
Lymphocytes Absolute: 2.4 10*3/uL (ref 0.7–3.1)
Lymphs: 46 %
MCH: 29.4 pg (ref 26.6–33.0)
MCHC: 33.6 g/dL (ref 31.5–35.7)
MCV: 88 fL (ref 79–97)
Monocytes Absolute: 0.4 10*3/uL (ref 0.1–0.9)
Monocytes: 8 %
Neutrophils Absolute: 2.2 10*3/uL (ref 1.4–7.0)
Neutrophils: 41 %
Platelets: 384 10*3/uL (ref 150–450)
RBC: 4.18 x10E6/uL (ref 3.77–5.28)
RDW: 17.3 % — ABNORMAL HIGH (ref 11.7–15.4)
WBC: 5.3 10*3/uL (ref 3.4–10.8)

## 2020-01-16 ENCOUNTER — Other Ambulatory Visit: Payer: Self-pay

## 2020-01-16 ENCOUNTER — Encounter: Payer: Self-pay | Admitting: Pulmonary Disease

## 2020-01-16 ENCOUNTER — Ambulatory Visit (INDEPENDENT_AMBULATORY_CARE_PROVIDER_SITE_OTHER): Payer: Medicare Other | Admitting: Pulmonary Disease

## 2020-01-16 VITALS — BP 124/58 | HR 67 | Ht 61.0 in | Wt 103.0 lb

## 2020-01-16 DIAGNOSIS — Z87891 Personal history of nicotine dependence: Secondary | ICD-10-CM

## 2020-01-16 DIAGNOSIS — R911 Solitary pulmonary nodule: Secondary | ICD-10-CM | POA: Diagnosis not present

## 2020-01-16 DIAGNOSIS — J4520 Mild intermittent asthma, uncomplicated: Secondary | ICD-10-CM

## 2020-01-16 NOTE — Patient Instructions (Addendum)
Thank you for visiting Dr. Valeta Harms at [facility]. Today we recommend the following:  Orders Placed This Encounter  Procedures  . CT Chest Wo Contrast   Follow up ct images in 1 year to look at 94m Right middle lobe lung nodule.   Return in about 1 year (around 01/15/2021) for with APP or Dr. IValeta Harms    Please do your part to reduce the spread of COVID-19.

## 2020-01-16 NOTE — Progress Notes (Signed)
Synopsis: Referred in April 2021 for lung nodule by [name], FNP  Subjective:   PATIENT ID: Leslie Reeves GENDER: female DOB: 08/02/1941, MRN: [id number]  Chief Complaint  Patient presents with  . Consult    Pt being referred due to a lung nodule seen on CT.  Pt states that she soes have asthma and allergies and states when she has a flare up, she will have complaints of sneezing and postnasal drainage, hoarseness, occ coughing, and sometimes SOB.    This is a 79 year old female past medical history of diabetes and hypertension.  Referred for evaluation of lung nodule.  Patient underwent CT chest on 12/18/2019 which revealed a 3 mm noncalcified right middle lobe pulmonary nodule as well as aortic atherosclerosis and a thyroid goiter.  She is a former smoker, smoked for approximately 15 years only half pack per day.  Patient has no respiratory complaints today.  We reviewed her CT imaging today in the office.  She does state that she has had an asthma diagnosis for the past 20 years.  She uses as needed albuterol.  She has done well with this with no other symptoms.  She states that she has not used her albuterol this past month.  But on occasion she does have some issues and starting to become more apparent with the season change into springtime with more pollen.   Past Medical History:  Diagnosis Date  . Allergy   . Diabetes mellitus without complication ([place])   . Hypertension      Family History  Problem Relation Age of Onset  . Breast cancer Neg Hx      Past Surgical History:  Procedure Laterality Date  . ABDOMINAL HYSTERECTOMY      Social History   Socioeconomic History  . Marital status: Married    Spouse name: Not on file  . Number of children: Not on file  . Years of education: Not on file  . Highest education level: Not on file  Occupational History  . Occupation: retired  Tobacco Use  . Smoking status: Former Smoker    Packs/day: 0.50    Years: 15.00     Pack years: 7.50    Types: Cigarettes    Quit date: 2017    Years since quitting: 4.3  . Smokeless tobacco: Never Used  Substance and Sexual Activity  . Alcohol use: Not Currently  . Drug use: Not Currently  . Sexual activity: Not Currently  Other Topics Concern  . Not on file  Social History Narrative  . Not on file   Social Determinants of Health   Financial Resource Strain:   . Difficulty of Paying Living Expenses:   Food Insecurity: No Food Insecurity  . Worried About Charity fundraiser in the Last Year: Never true  . Ran Out of Food in the Last Year: Never true  Transportation Needs: No Transportation Needs  . Lack of Transportation (Medical): No  . Lack of Transportation (Non-Medical): No  Physical Activity: Insufficiently Active  . Days of Exercise per Week: 3 days  . Minutes of Exercise per Session: 30 min  Stress: No Stress Concern Present  . Feeling of Stress : Not at all  Social Connections:   . Frequency of Communication with Friends and Family:   . Frequency of Social Gatherings with Friends and Family:   . Attends Religious Services:   . Active Member of Clubs or Organizations:   . Attends Archivist Meetings:   .  Marital Status:   Intimate Partner Violence: Not At Risk  . Fear of Current or Ex-Partner: No  . Emotionally Abused: No  . Physically Abused: No  . Sexually Abused: No     Allergies  Allergen Reactions  . Penicillins Rash     Outpatient Medications Prior to Visit  Medication Sig Dispense Refill  . amLODipine (NORVASC) 10 MG tablet TAKE 1 TABLET BY MOUTH EVERY DAY 90 tablet 1  . blood glucose meter kit and supplies KIT Dispense based on patient and insurance preference. Use up to four times daily as directed. (FOR ICD-9 250.00, 250.01). 1 each 0  . diclofenac sodium (VOLTAREN) 1 % GEL Apply 2 g topically 4 (four) times daily. 100 g 1  . glucose blood (ACCU-CHEK AVIVA PLUS) test strip Use as instructed 300 each 3  . levothyroxine  (SYNTHROID) 25 MCG tablet TAKE 1.5 TABLET (37.5MCG) BY ORAL ROUTE EVERY DAY 135 tablet 0  . metFORMIN (GLUCOPHAGE) 500 MG tablet Take 0.5 tablets (250 mg total) by mouth 2 (two) times daily. 90 tablet 0  . mirtazapine (REMERON) 15 MG tablet Take 1 tablet (15 mg total) by mouth at bedtime. 90 tablet 1  . olmesartan-hydrochlorothiazide (BENICAR HCT) 20-12.5 MG tablet Take 1 tablet by mouth daily. PATIENT NEEDS AN APPOINTMENT 90 tablet 0  . PROAIR HFA 108 (90 Base) MCG/ACT inhaler INHALE 2 PUFFS BY MOUTH 4 TIMES A DAY 8.5 g 1  . rosuvastatin (CRESTOR) 20 MG tablet Take 1 tablet (20 mg total) by mouth daily. 90 tablet 0  . Vitamin D, Ergocalciferol, (DRISDOL) 1.25 MG (50000 UNIT) CAPS capsule TAKE ONE TWICE A WEEK 24 capsule 1  . WIXELA INHUB 100-50 MCG/DOSE AEPB TAKE 1 PUFF BY MOUTH EVERY 12 HOURS IN THE MORNING AND IN THE EVENING 180 each 1   No facility-administered medications prior to visit.    Review of Systems  Constitutional: Negative for chills, fever, malaise/fatigue and weight loss.  HENT: Negative for hearing loss, sore throat and tinnitus.   Eyes: Negative for blurred vision and double vision.  Respiratory: Negative for cough, hemoptysis, sputum production, shortness of breath, wheezing and stridor.   Cardiovascular: Negative for chest pain, palpitations, orthopnea, leg swelling and PND.  Gastrointestinal: Negative for abdominal pain, constipation, diarrhea, heartburn, nausea and vomiting.  Genitourinary: Negative for dysuria, hematuria and urgency.  Musculoskeletal: Negative for joint pain and myalgias.  Skin: Negative for itching and rash.  Neurological: Negative for dizziness, tingling, weakness and headaches.  Endo/Heme/Allergies: Negative for environmental allergies. Does not bruise/bleed easily.  Psychiatric/Behavioral: Negative for depression. The patient is not nervous/anxious and does not have insomnia.   All other systems reviewed and are negative.    Objective:   Physical Exam Vitals reviewed.  Constitutional:      General: She is not in acute distress.    Appearance: She is well-developed.  HENT:     Head: Normocephalic and atraumatic.  Eyes:     General: No scleral icterus.    Conjunctiva/sclera: Conjunctivae normal.     Pupils: Pupils are equal, round, and reactive to light.  Neck:     Vascular: No JVD.     Trachea: No tracheal deviation.  Cardiovascular:     Rate and Rhythm: Normal rate and regular rhythm.     Heart sounds: Normal heart sounds. No murmur.  Pulmonary:     Effort: Pulmonary effort is normal. No tachypnea, accessory muscle usage or respiratory distress.     Breath sounds: Normal breath sounds. No stridor.  No wheezing, rhonchi or rales.  Abdominal:     General: Bowel sounds are normal. There is no distension.     Palpations: Abdomen is soft.     Tenderness: There is no abdominal tenderness.  Musculoskeletal:        General: No tenderness.     Cervical back: Neck supple.  Lymphadenopathy:     Cervical: No cervical adenopathy.  Skin:    General: Skin is warm and dry.     Capillary Refill: Capillary refill takes less than 2 seconds.     Findings: No rash.  Neurological:     Mental Status: She is alert and oriented to person, place, and time.  Psychiatric:        Behavior: Behavior normal.      Vitals:   01/16/20 1017  BP: (!) 124/58  Pulse: 67  SpO2: 94%  Weight: 103 lb (46.7 kg)  Height: 5' 1" (1.549 m)   94% on RA BMI Readings from Last 3 Encounters:  01/16/20 19.46 kg/m  01/02/20 17.68 kg/m  11/21/19 18.08 kg/m   Wt Readings from Last 3 Encounters:  01/16/20 103 lb (46.7 kg)  01/02/20 95 lb 6.4 oz (43.3 kg)  11/21/19 97 lb 9.6 oz (44.3 kg)     CBC    Component Value Date/Time   WBC 5.3 01/02/2020 1134   RBC 4.18 01/02/2020 1134   HGB 12.3 01/02/2020 1134   HCT 36.6 01/02/2020 1134   PLT 384 01/02/2020 1134   MCV 88 01/02/2020 1134   MCH 29.4 01/02/2020 1134   MCHC 33.6 01/02/2020  1134   RDW 17.3 (H) 01/02/2020 1134   LYMPHSABS 2.4 01/02/2020 1134   EOSABS 0.2 01/02/2020 1134   BASOSABS 0.1 01/02/2020 1134     Chest Imaging: 12/18/2019 CT chest: 3 mm noncalcified right middle lobe pulmonary nodule.  Pulmonary Functions Testing Results: No flowsheet data found.  FeNO: None  Pathology: None   Echocardiogram: None   Heart Catheterization: None     Assessment & Plan:     ICD-10-CM   1. Nodule of middle lobe of right lung  R91.1   2. Former smoker  Z87.891   3. Mild intermittent asthma without complication  Y60.63     Assessment:   New diagnosis of right middle lobe lung nodule, although subcentimeter in size patient is a former smoker and does not exclude the risk of this developing into a potential malignancy.  Due to her smoking history and age as well as location of the nodule it is still considered low risk but would recommend 1 year follow-up imaging.  Plan Following Extensive Data Review & Interpretation:  . I reviewed prior external note(s) from 01/02/2020, [name], NP primary care office visit . I reviewed the result(s) of 01/02/2020 CBC unremarkable . I have ordered 5-monthfollow-up noncontrasted CT of the chest for lung nodule  Independent interpretation of tests . Review of patient's 01/02/2020 CT chest images revealed 3 mm right middle lobe lung nodule. The patient's images have been independently reviewed by me.    We appreciate the consultation. Patient return to clinic in 12 months following CT chest imaging.    Current Outpatient Medications:  .  amLODipine (NORVASC) 10 MG tablet, TAKE 1 TABLET BY MOUTH EVERY DAY, Disp: 90 tablet, Rfl: 1 .  blood glucose meter kit and supplies KIT, Dispense based on patient and insurance preference. Use up to four times daily as directed. (FOR ICD-9 250.00, 250.01)., Disp: 1 each, Rfl: 0 .  diclofenac sodium (VOLTAREN) 1 % GEL, Apply 2 g topically 4 (four) times daily., Disp: 100 g, Rfl: 1 .   glucose blood (ACCU-CHEK AVIVA PLUS) test strip, Use as instructed, Disp: 300 each, Rfl: 3 .  levothyroxine (SYNTHROID) 25 MCG tablet, TAKE 1.5 TABLET (37.5MCG) BY ORAL ROUTE EVERY DAY, Disp: 135 tablet, Rfl: 0 .  metFORMIN (GLUCOPHAGE) 500 MG tablet, Take 0.5 tablets (250 mg total) by mouth 2 (two) times daily., Disp: 90 tablet, Rfl: 0 .  mirtazapine (REMERON) 15 MG tablet, Take 1 tablet (15 mg total) by mouth at bedtime., Disp: 90 tablet, Rfl: 1 .  olmesartan-hydrochlorothiazide (BENICAR HCT) 20-12.5 MG tablet, Take 1 tablet by mouth daily. PATIENT NEEDS AN APPOINTMENT, Disp: 90 tablet, Rfl: 0 .  PROAIR HFA 108 (90 Base) MCG/ACT inhaler, INHALE 2 PUFFS BY MOUTH 4 TIMES A DAY, Disp: 8.5 g, Rfl: 1 .  rosuvastatin (CRESTOR) 20 MG tablet, Take 1 tablet (20 mg total) by mouth daily., Disp: 90 tablet, Rfl: 0 .  Vitamin D, Ergocalciferol, (DRISDOL) 1.25 MG (50000 UNIT) CAPS capsule, TAKE ONE TWICE A WEEK, Disp: 24 capsule, Rfl: 1 .  WIXELA INHUB 100-50 MCG/DOSE AEPB, TAKE 1 PUFF BY MOUTH EVERY 12 HOURS IN THE MORNING AND IN THE EVENING, Disp: 180 each, Rfl: 1   Garner Nash, DO [facility] 01/16/2020 10:34 AM

## 2020-01-20 ENCOUNTER — Other Ambulatory Visit: Payer: Self-pay | Admitting: Nurse Practitioner

## 2020-02-03 ENCOUNTER — Other Ambulatory Visit: Payer: Self-pay | Admitting: Nurse Practitioner

## 2020-02-11 ENCOUNTER — Other Ambulatory Visit: Payer: Self-pay | Admitting: Nurse Practitioner

## 2020-03-13 ENCOUNTER — Other Ambulatory Visit: Payer: Self-pay | Admitting: Nurse Practitioner

## 2020-03-25 ENCOUNTER — Other Ambulatory Visit: Payer: Self-pay | Admitting: Nurse Practitioner

## 2020-03-26 ENCOUNTER — Ambulatory Visit (INDEPENDENT_AMBULATORY_CARE_PROVIDER_SITE_OTHER): Payer: Medicare Other

## 2020-03-26 ENCOUNTER — Ambulatory Visit (INDEPENDENT_AMBULATORY_CARE_PROVIDER_SITE_OTHER): Payer: Medicare Other | Admitting: Nurse Practitioner

## 2020-03-26 ENCOUNTER — Other Ambulatory Visit: Payer: Self-pay

## 2020-03-26 ENCOUNTER — Encounter: Payer: Self-pay | Admitting: Nurse Practitioner

## 2020-03-26 VITALS — BP 110/60 | HR 59 | Temp 97.9°F | Ht 61.4 in | Wt 97.0 lb

## 2020-03-26 DIAGNOSIS — R7303 Prediabetes: Secondary | ICD-10-CM

## 2020-03-26 DIAGNOSIS — I10 Essential (primary) hypertension: Secondary | ICD-10-CM | POA: Diagnosis not present

## 2020-03-26 DIAGNOSIS — R634 Abnormal weight loss: Secondary | ICD-10-CM | POA: Diagnosis not present

## 2020-03-26 DIAGNOSIS — R630 Anorexia: Secondary | ICD-10-CM | POA: Diagnosis not present

## 2020-03-26 DIAGNOSIS — Z1159 Encounter for screening for other viral diseases: Secondary | ICD-10-CM

## 2020-03-26 DIAGNOSIS — Z0000 Encounter for general adult medical examination without abnormal findings: Secondary | ICD-10-CM | POA: Diagnosis not present

## 2020-03-26 DIAGNOSIS — E041 Nontoxic single thyroid nodule: Secondary | ICD-10-CM | POA: Diagnosis not present

## 2020-03-26 DIAGNOSIS — Z23 Encounter for immunization: Secondary | ICD-10-CM

## 2020-03-26 MED ORDER — MEGESTROL ACETATE 625 MG/5ML PO SUSP: 625.0000 mg | Freq: Every day | ORAL | 1 refills | Status: DC

## 2020-03-26 MED ORDER — PREVNAR 13 IM SUSP: 0.5000 mL | INTRAMUSCULAR | 0 refills | Status: AC

## 2020-03-26 MED ORDER — BOOSTRIX 5-2.5-18.5 LF-MCG/0.5 IM SUSP: 0.5000 mL | Freq: Once | INTRAMUSCULAR | 0 refills | Status: AC

## 2020-03-26 NOTE — Progress Notes (Signed)
This visit occurred during the SARS-CoV-2 public health emergency.  Safety protocols were in place, including screening questions prior to the visit, additional usage of staff PPE, and extensive cleaning of exam room while observing appropriate contact time as indicated for disinfecting solutions.  Subjective:     Patient ID: Leslie Reeves , female    DOB: [birth year] , 79 y.o.   MRN: [id number]   Chief Complaint  Patient presents with  . Weight Loss    HPI  Presents today for weight loss follow up after starting mirtazapine. She reports that the medication make her sleep more during the day and her appetite  is a little better. She eats but not a large amount and reports that bread leave a bitter taste in her mouth. Still having issues with the cold and has not been to any other providers since her last visit. She does exercise and work in the yard but never feels hungry but drinks ensure. She drinks water only about half the day and maybe a half of a sixteen ounce bottle. She does drink milkshakes from cookout once a week and finishes them completely. She does also report that she is using her albuterol more frequently.  Wt Readings from Last 3 Encounters: 03/26/20 : 97 lb (44 kg) 03/26/20 : 97 lb (44 kg) 01/16/20 : 103 lb (46.7 kg)     Past Medical History:  Diagnosis Date  . Allergy   . Diabetes mellitus without complication ([place])   . Hypertension      Family History  Problem Relation Age of Onset  . Breast cancer Neg Hx      Current Outpatient Medications:  .  albuterol (VENTOLIN HFA) 108 (90 Base) MCG/ACT inhaler, INHALE 2 PUFFS BY MOUTH 4 TIMES A DAY, Disp: 8.5 g, Rfl: 1 .  amLODipine (NORVASC) 10 MG tablet, TAKE 1 TABLET BY MOUTH EVERY DAY, Disp: 90 tablet, Rfl: 1 .  blood glucose meter kit and supplies KIT, Dispense based on patient and insurance preference. Use up to four times daily as directed. (FOR ICD-9 250.00, 250.01)., Disp: 1 each, Rfl: 0 .  diclofenac sodium  (VOLTAREN) 1 % GEL, Apply 2 g topically 4 (four) times daily., Disp: 100 g, Rfl: 1 .  glucose blood (ACCU-CHEK AVIVA PLUS) test strip, Use as instructed, Disp: 300 each, Rfl: 3 .  levothyroxine (SYNTHROID) 25 MCG tablet, TAKE 1.5 TABLET (37.5MCG) BY ORAL ROUTE EVERY DAY, Disp: 135 tablet, Rfl: 0 .  metFORMIN (GLUCOPHAGE) 500 MG tablet, TAKE 0.5 TABLETS (250 MG TOTAL) BY MOUTH 2 (TWO) TIMES DAILY., Disp: 90 tablet, Rfl: 0 .  mirtazapine (REMERON) 15 MG tablet, Take 1 tablet (15 mg total) by mouth at bedtime., Disp: 90 tablet, Rfl: 1 .  olmesartan-hydrochlorothiazide (BENICAR HCT) 20-12.5 MG tablet, TAKE 1 TABLET BY MOUTH DAILY., Disp: 90 tablet, Rfl: 0 .  rosuvastatin (CRESTOR) 20 MG tablet, Take 1 tablet (20 mg total) by mouth daily., Disp: 90 tablet, Rfl: 0 .  Vitamin D, Ergocalciferol, (DRISDOL) 1.25 MG (50000 UNIT) CAPS capsule, TAKE ONE TWICE A WEEK, Disp: 24 capsule, Rfl: 1 .  WIXELA INHUB 100-50 MCG/DOSE AEPB, TAKE 1 PUFF BY MOUTH EVERY 12 HOURS IN THE MORNING AND IN THE EVENING, Disp: 180 each, Rfl: 1 .  pneumococcal 13-valent conjugate vaccine (PREVNAR 13) SUSP injection, Inject 0.5 mLs into the muscle tomorrow at 10 am for 1 dose., Disp: 0.5 mL, Rfl: 0 .  Tdap (BOOSTRIX) 5-2.5-18.5 LF-MCG/0.5 injection, Inject 0.5 mLs into the muscle once for  1 dose., Disp: 0.5 mL, Rfl: 0   Allergies  Allergen Reactions  . Penicillins Rash     Review of Systems  Constitutional: Negative.  Negative for chills.  Respiratory: Positive for wheezing.   Cardiovascular: Negative.  Negative for chest pain, palpitations and leg swelling.  Gastrointestinal: Negative for abdominal distention, abdominal pain, diarrhea and nausea.  Endocrine: Positive for cold intolerance.  Genitourinary: Negative.   Skin: Negative.   Neurological: Negative for dizziness, syncope and headaches.  Psychiatric/Behavioral: Negative.      Today's Vitals   03/26/20 1031  BP: 110/60  Pulse: (!) 59  Temp: 97.9 F (36.6 C)   TempSrc: Oral  Weight: 97 lb (44 kg)  Height: 5' 1.4" (1.56 m)   Body mass index is 18.09 kg/m.   Objective:  Physical Exam Vitals reviewed.  Constitutional:      Appearance: Normal appearance.  HENT:     Head: Normocephalic and atraumatic.  Cardiovascular:     Rate and Rhythm: Regular rhythm. Bradycardia present.     Pulses: Normal pulses.     Heart sounds: Normal heart sounds. No murmur heard.      Comments: Sinus Brady Pulmonary:     Effort: Pulmonary effort is normal. No respiratory distress.     Breath sounds: No stridor. Wheezing present. No rhonchi.     Comments: Bibasilar wheezing present Chest:     Chest wall: Tenderness present.  Musculoskeletal:        General: Normal range of motion.     Cervical back: Normal range of motion.  Skin:    General: Skin is warm and dry.     Capillary Refill: Capillary refill takes less than 2 seconds.  Neurological:     General: No focal deficit present.     Mental Status: She is alert and oriented to person, place, and time.     Cranial Nerves: No cranial nerve deficit.  Psychiatric:        Mood and Affect: Mood normal.        Behavior: Behavior normal.        Thought Content: Thought content normal.        Judgment: Judgment normal.         Assessment And Plan:     1. Decreased appetite  Will change to megace since mirtzapine was not effective and made her too frequently - Ambulatory referral to Gastroenterology - megestrol (MEGACE ES) 625 MG/5ML suspension; Take 5 mLs (625 mg total) by mouth daily.  Dispense: 150 mL; Refill: 1  2. Abnormal weight loss  Weight is stable  Will refer to GI for further evaluation - Ambulatory referral to Gastroenterology - megestrol (MEGACE ES) 625 MG/5ML suspension; Take 5 mLs (625 mg total) by mouth daily.  Dispense: 150 mL; Refill: 1 - T4 - TSH - T3, free  3. Essential hypertension . B/P is controlled.  . CMP ordered to check renal function.  . The importance of regular  exercise and dietary modification was stressed to the patient.  - CMP14+EGFR  4. Prediabetes  Chronic, stable  No current medications  5. Right thyroid nodule  Will check thyroid studies. - T4 - TSH - T3, free     Marylu Lund, RN    THE PATIENT IS ENCOURAGED TO PRACTICE SOCIAL DISTANCING DUE TO THE COVID-19 PANDEMIC.

## 2020-03-26 NOTE — Patient Instructions (Signed)
Leslie Reeves , Thank you for taking time to come for your Medicare Wellness Visit. I appreciate your ongoing commitment to your health goals. Please review the following plan we discussed and let me know if I can assist you in the future.   Screening recommendations/referrals: Colonoscopy: not required Mammogram: not trequired Bone Density: completed 07/13/2018 Recommended yearly ophthalmology/optometry visit for glaucoma screening and checkup Recommended yearly dental visit for hygiene and checkup  Vaccinations: Influenza vaccine: completed 06/28/2019, due 04/27/2020 Pneumococcal vaccine: sent to pharmacy Tdap vaccine: sent to pharmacy Shingles vaccine: discussed   Covid-19:12/10/2019, 11/17/2019  Advanced directives: Advance directive discussed with you today. Even though you declined this today please call our office should you change your mind and we can give you the proper paperwork for you to fill out.   Conditions/risks identified: underweight  Next appointment: Follow up in one year for your annual wellness visit    Preventive Care 21 Years and Older, Female Preventive care refers to lifestyle choices and visits with your health care provider that can promote health and wellness. What does preventive care include?  A yearly physical exam. This is also called an annual well check.  Dental exams once or twice a year.  Routine eye exams. Ask your health care provider how often you should have your eyes checked.  Personal lifestyle choices, including:  Daily care of your teeth and gums.  Regular physical activity.  Eating a healthy diet.  Avoiding tobacco and drug use.  Limiting alcohol use.  Practicing safe sex.  Taking low-dose aspirin every day.  Taking vitamin and mineral supplements as recommended by your health care provider. What happens during an annual well check? The services and screenings done by your health care provider during your annual well check  will depend on your age, overall health, lifestyle risk factors, and family history of disease. Counseling  Your health care provider may ask you questions about your:  Alcohol use.  Tobacco use.  Drug use.  Emotional well-being.  Home and relationship well-being.  Sexual activity.  Eating habits.  History of falls.  Memory and ability to understand (cognition).  Work and work Statistician.  Reproductive health. Screening  You may have the following tests or measurements:  Height, weight, and BMI.  Blood pressure.  Lipid and cholesterol levels. These may be checked every 5 years, or more frequently if you are over 18 years old.  Skin check.  Lung cancer screening. You may have this screening every year starting at age 53 if you have a 30-pack-year history of smoking and currently smoke or have quit within the past 15 years.  Fecal occult blood test (FOBT) of the stool. You may have this test every year starting at age 45.  Flexible sigmoidoscopy or colonoscopy. You may have a sigmoidoscopy every 5 years or a colonoscopy every 10 years starting at age 29.  Hepatitis C blood test.  Hepatitis B blood test.  Sexually transmitted disease (STD) testing.  Diabetes screening. This is done by checking your blood sugar (glucose) after you have not eaten for a while (fasting). You may have this done every 1-3 years.  Bone density scan. This is done to screen for osteoporosis. You may have this done starting at age 65.  Mammogram. This may be done every 1-2 years. Talk to your health care provider about how often you should have regular mammograms. Talk with your health care provider about your test results, treatment options, and if necessary, the need for more  tests. Vaccines  Your health care provider may recommend certain vaccines, such as:  Influenza vaccine. This is recommended every year.  Tetanus, diphtheria, and acellular pertussis (Tdap, Td) vaccine. You may  need a Td booster every 10 years.  Zoster vaccine. You may need this after age 48.  Pneumococcal 13-valent conjugate (PCV13) vaccine. One dose is recommended after age 42.  Pneumococcal polysaccharide (PPSV23) vaccine. One dose is recommended after age 24. Talk to your health care provider about which screenings and vaccines you need and how often you need them. This information is not intended to replace advice given to you by your health care provider. Make sure you discuss any questions you have with your health care provider. Document Released: 10/10/2015 Document Revised: 06/02/2016 Document Reviewed: 07/15/2015 Elsevier Interactive Patient Education  2017 [employer] Prevention in the Home Falls can cause injuries. They can happen to people of all ages. There are many things you can do to make your home safe and to help prevent falls. What can I do on the outside of my home?  Regularly fix the edges of walkways and driveways and fix any cracks.  Remove anything that might make you trip as you walk through a door, such as a raised step or threshold.  Trim any bushes or trees on the path to your home.  Use bright outdoor lighting.  Clear any walking paths of anything that might make someone trip, such as rocks or tools.  Regularly check to see if handrails are loose or broken. Make sure that both sides of any steps have handrails.  Any raised decks and porches should have guardrails on the edges.  Have any leaves, snow, or ice cleared regularly.  Use sand or salt on walking paths during winter.  Clean up any spills in your garage right away. This includes oil or grease spills. What can I do in the bathroom?  Use night lights.  Install grab bars by the toilet and in the tub and shower. Do not use towel bars as grab bars.  Use non-skid mats or decals in the tub or shower.  If you need to sit down in the shower, use a plastic, non-slip stool.  Keep the floor  dry. Clean up any water that spills on the floor as soon as it happens.  Remove soap buildup in the tub or shower regularly.  Attach bath mats securely with double-sided non-slip rug tape.  Do not have throw rugs and other things on the floor that can make you trip. What can I do in the bedroom?  Use night lights.  Make sure that you have a light by your bed that is easy to reach.  Do not use any sheets or blankets that are too big for your bed. They should not hang down onto the floor.  Have a firm chair that has side arms. You can use this for support while you get dressed.  Do not have throw rugs and other things on the floor that can make you trip. What can I do in the kitchen?  Clean up any spills right away.  Avoid walking on wet floors.  Keep items that you use a lot in easy-to-reach places.  If you need to reach something above you, use a strong step stool that has a grab bar.  Keep electrical cords out of the way.  Do not use floor polish or wax that makes floors slippery. If you must use wax, use non-skid floor  wax.  Do not have throw rugs and other things on the floor that can make you trip. What can I do with my stairs?  Do not leave any items on the stairs.  Make sure that there are handrails on both sides of the stairs and use them. Fix handrails that are broken or loose. Make sure that handrails are as long as the stairways.  Check any carpeting to make sure that it is firmly attached to the stairs. Fix any carpet that is loose or worn.  Avoid having throw rugs at the top or bottom of the stairs. If you do have throw rugs, attach them to the floor with carpet tape.  Make sure that you have a light switch at the top of the stairs and the bottom of the stairs. If you do not have them, ask someone to add them for you. What else can I do to help prevent falls?  Wear shoes that:  Do not have high heels.  Have rubber bottoms.  Are comfortable and fit you  well.  Are closed at the toe. Do not wear sandals.  If you use a stepladder:  Make sure that it is fully opened. Do not climb a closed stepladder.  Make sure that both sides of the stepladder are locked into place.  Ask someone to hold it for you, if possible.  Clearly mark and make sure that you can see:  Any grab bars or handrails.  First and last steps.  Where the edge of each step is.  Use tools that help you move around (mobility aids) if they are needed. These include:  Canes.  Walkers.  Scooters.  Crutches.  Turn on the lights when you go into a dark area. Replace any light bulbs as soon as they burn out.  Set up your furniture so you have a clear path. Avoid moving your furniture around.  If any of your floors are uneven, fix them.  If there are any pets around you, be aware of where they are.  Review your medicines with your doctor. Some medicines can make you feel dizzy. This can increase your chance of falling. Ask your doctor what other things that you can do to help prevent falls. This information is not intended to replace advice given to you by your health care provider. Make sure you discuss any questions you have with your health care provider. Document Released: 07/10/2009 Document Revised: 02/19/2016 Document Reviewed: 10/18/2014 Elsevier Interactive Patient Education  2017 [employer].

## 2020-03-26 NOTE — Progress Notes (Signed)
This visit occurred during the SARS-CoV-2 public health emergency.  Safety protocols were in place, including screening questions prior to the visit, additional usage of staff PPE, and extensive cleaning of exam room while observing appropriate contact time as indicated for disinfecting solutions. Subjective:   Leslie Reeves is a 79 y.o. female who presents for Medicare Annual (Subsequent) preventive examination.  Review of Systems     Cardiac Risk Factors include: advanced age (>63mn, >>44women);hypertension;sedentary lifestyle     Objective:    Today's Vitals   03/26/20 1016  BP: 110/60  Pulse: (!) 59  Temp: 97.9 F (36.6 C)  TempSrc: Oral  SpO2: 93%  Weight: 97 lb (44 kg)  Height: 5' 1.4" (1.56 m)   Body mass index is 18.09 kg/m.  Advanced Directives 03/26/2020 03/21/2019  Does Patient Have a Medical Advance Directive? No No    Current Medications (verified) Outpatient Encounter Medications as of 03/26/2020  Medication Sig  . albuterol (VENTOLIN HFA) 108 (90 Base) MCG/ACT inhaler INHALE 2 PUFFS BY MOUTH 4 TIMES A DAY  . amLODipine (NORVASC) 10 MG tablet TAKE 1 TABLET BY MOUTH EVERY DAY  . blood glucose meter kit and supplies KIT Dispense based on patient and insurance preference. Use up to four times daily as directed. (FOR ICD-9 250.00, 250.01).  .Marland Kitchendiclofenac sodium (VOLTAREN) 1 % GEL Apply 2 g topically 4 (four) times daily.  .Marland Kitchenglucose blood (ACCU-CHEK AVIVA PLUS) test strip Use as instructed  . levothyroxine (SYNTHROID) 25 MCG tablet TAKE 1.5 TABLET (37.5MCG) BY ORAL ROUTE EVERY DAY  . metFORMIN (GLUCOPHAGE) 500 MG tablet TAKE 0.5 TABLETS (250 MG TOTAL) BY MOUTH 2 (TWO) TIMES DAILY.  . mirtazapine (REMERON) 15 MG tablet Take 1 tablet (15 mg total) by mouth at bedtime.  .Marland Kitchenolmesartan-hydrochlorothiazide (BENICAR HCT) 20-12.5 MG tablet TAKE 1 TABLET BY MOUTH DAILY.  . rosuvastatin (CRESTOR) 20 MG tablet Take 1 tablet (20 mg total) by mouth daily.  . Vitamin D,  Ergocalciferol, (DRISDOL) 1.25 MG (50000 UNIT) CAPS capsule TAKE ONE TWICE A WEEK  . WIXELA INHUB 100-50 MCG/DOSE AEPB TAKE 1 PUFF BY MOUTH EVERY 12 HOURS IN THE MORNING AND IN THE EVENING  . pneumococcal 13-valent conjugate vaccine (PREVNAR 13) SUSP injection Inject 0.5 mLs into the muscle tomorrow at 10 am for 1 dose.  . Tdap (BOOSTRIX) 5-2.5-18.5 LF-MCG/0.5 injection Inject 0.5 mLs into the muscle once for 1 dose.   No facility-administered encounter medications on file as of 03/26/2020.    Allergies (verified) Penicillins   History: Past Medical History:  Diagnosis Date  . Allergy   . [place] mellitus [place] complication [place]   . Hypertension    Past Surgical History:  Procedure Laterality Date  . ABDOMINAL HYSTERECTOMY     Family History  Problem Relation Age of Onset  . Breast cancer Neg Hx    Social History   Socioeconomic History  . Marital status: Married    Spouse name: Not on file  . Number of children: Not on file  . Years of education: Not on file  . Highest education level: Not on file  Occupational History  . Occupation: retired  Tobacco Use  . Smoking status: Former Smoker    Packs/day: 0.50    Years: 15.00    Pack years: 7.50    Types: Cigarettes    Quit date: 2017    Years since quitting: 4.4  . Smokeless tobacco: Never Used  Vaping Use  . Vaping Use: Never used  Substance  and Sexual Activity  . Alcohol use: Not Currently  . Drug use: Not Currently  . Sexual activity: Not Currently  Other Topics Concern  . Not on file  Social History Narrative  . Not on file   Social Determinants of Health   Financial Resource Strain: Low Risk   . Difficulty of Paying Living Expenses: Not hard at all  Food Insecurity: No Food Insecurity  . Worried About Charity fundraiser in the Last Year: Never true  . Ran Out of Food in the Last Year: Never true  Transportation Needs: No Transportation Needs  . Lack of Transportation (Medical): No  . Lack of  Transportation (Non-Medical): No  Physical Activity: Insufficiently Active  . Days of Exercise per Week: 2 days  . Minutes of Exercise per Session: 60 min  Stress: No Stress Concern Present  . Feeling of Stress : Not at all  Social Connections:   . Frequency of Communication with Friends and Family:   . Frequency of Social Gatherings with Friends and Family:   . Attends Religious Services:   . Active Member of Clubs or Organizations:   . Attends Archivist Meetings:   [name] Marital Status:     Tobacco Counseling Counseling given: Not Answered   Clinical Intake:  Pre-visit preparation completed: Yes  Pain : No/denies pain     Nutritional Status: BMI <19  Underweight Nutritional Risks: None Diabetes: No  How often do you need to have someone help you when you read instructions, pamphlets, or other written materials from your doctor or pharmacy?: 1 - Never What is the last grade level you completed in school?: 12th grade  Diabetic? no  Interpreter Needed?: No  Information entered by :: NAllen LPN   Activities of Daily Living In your present state of health, do you have any difficulty performing the following activities: 03/26/2020 03/25/2020  Hearing? Y N  Comment a little -  Vision? N N  Difficulty concentrating or making decisions? N N  Walking or climbing stairs? N N  Dressing or bathing? N N  Doing errands, shopping? N N  Preparing Food and eating ? N -  Using the Toilet? N -  In the past six months, have you accidently leaked urine? N -  Do you have problems with loss of bowel control? N -  Managing your Medications? N -  Managing your Finances? N -  Housekeeping or managing your Housekeeping? N -  Some recent data might be hidden    Patient Care Team: [name], FNP as PCP - General (General Practice)  Indicate any recent Medical Services you may have received from other than [facility] providers in the past year (date may be approximate).       Assessment:   This is a routine wellness examination for [name].  Hearing/Vision screen  Hearing Screening   _0  _1  _2  _3  _4  _5  _6  _7  _8   Right ear:           Left ear:           Vision Screening Comments: Regular Eye Exams, [place]  Dietary issues and exercise activities discussed: Current Exercise Habits: Home exercise routine, Type of exercise: walking, Time (Minutes): 60, Frequency (Times/Week): 2, Weekly Exercise (Minutes/Week): 120  Goals    . Patient Stated     No goals    . Patient Stated     03/26/2020, wants to get to 125 pounds      Depression Screen  PHQ 2/9 Scores 03/26/2020 01/02/2020 11/21/2019 03/21/2019 02/05/2019 08/04/2018  PHQ - 2 Score 0 0 0 0 0 0  PHQ- 9 Score - - - 3 - -    Fall Risk Fall Risk  03/26/2020 01/02/2020 11/21/2019 03/21/2019 02/05/2019  Falls in the past year? 0 0 0 0 0  Risk for fall due to : Medication side effect - - Medication side effect -  Follow up Falls evaluation completed;Education provided;Falls prevention discussed - - Falls evaluation completed;Falls prevention discussed -    Any stairs in or around the home? No  If so, are there any without handrails? n/a Home free of loose throw rugs in walkways, pet beds, electrical cords, etc? Yes  Adequate lighting in your home to reduce risk of falls? Yes   ASSISTIVE DEVICES UTILIZED TO PREVENT FALLS:  Life alert? No  Use of a cane, walker or w/c? No  Grab bars in the bathroom? Yes  Shower chair or bench in shower? No  Elevated toilet seat or a handicapped toilet? No   TIMED UP AND GO:  Was the test performed? No .    Gait steady and fast without use of assistive device  Cognitive Function:     6CIT Screen 03/26/2020 03/21/2019  What Year? 0 points 0 points  What month? 0 points 0 points  What time? 0 points 0 points  Count back from 20 0 points 0 points  Months in reverse 0 points 0 points  Repeat phrase 6 points 2 points  Total Score 6 2     Immunizations Immunization History  Administered Date(s) Administered  . Influenza,inj,Quad PF,6+ Mos 07/06/2018  . Influenza-Unspecified 06/27/2013, 06/28/2019  . PFIZER SARS-COV-2 Vaccination 11/17/2019, 12/10/2019    TDAP status: Due, Education has been provided regarding the importance of this vaccine. Advised may receive this vaccine at local pharmacy or Health Dept. Aware to provide a copy of the vaccination record if obtained from local pharmacy or Health Dept. Verbalized acceptance and understanding. Flu Vaccine status: Up to date Pneumococcal vaccine status: Sent to pharmacy Covid-19 vaccine status: Completed vaccines  Qualifies for Shingles Vaccine? Yes   Zostavax completed No   Shingrix Completed?: No.    Education has been provided regarding the importance of this vaccine. Patient has been advised to call insurance company to determine out of pocket expense if they have not yet received this vaccine. Advised may also receive vaccine at local pharmacy or Health Dept. Verbalized acceptance and understanding.  Screening Tests Health Maintenance  Topic Date Due  . Hepatitis C Screening  Never done  . TETANUS/TDAP  Never done  . PNA vac Low Risk Adult (1 of 2 - PCV13) Never done  . INFLUENZA VACCINE  04/27/2020  . DEXA SCAN  Completed  . COVID-19 Vaccine  Completed    Health Maintenance  Health Maintenance Due  Topic Date Due  . Hepatitis C Screening  Never done  . TETANUS/TDAP  Never done  . PNA vac Low Risk Adult (1 of 2 - PCV13) Never done    Colorectal cancer screening: No longer required.  Mammogram status: No longer required.  Bone Density status: Completed 07/03/2018.   Lung Cancer Screening: (Low Dose CT Chest recommended if Age 92-80 years, 30 pack-year currently smoking OR have quit w/in 15years.) does not qualify.   Lung Cancer Screening Referral: no  Additional Screening:  Hepatitis C Screening: does qualify; Completed today  Vision Screening:  Recommended annual ophthalmology exams for early detection of glaucoma and other  disorders of the eye. Is the patient up to date with their annual eye exam?  Yes  Who is the provider or what is the name of the office in which the patient attends annual eye exams? [facility] If pt is not established with a provider, would they like to be referred to a provider to establish care? No .   Dental Screening: Recommended annual dental exams for proper oral hygiene  Community Resource Referral / Chronic Care Management: CRR required this visit?  No   CCM required this visit?  No      Plan:     I have personally reviewed and noted the following in the patient's chart:   . Medical and social history . Use of alcohol, tobacco or illicit drugs  . Current medications and supplements . Functional ability and status . Nutritional status . Physical activity . Advanced directives . List of other physicians . Hospitalizations, surgeries, and ER visits in previous 12 months . Vitals . Screenings to include cognitive, depression, and falls . Referrals and appointments  In addition, I have reviewed and discussed with patient certain preventive protocols, quality metrics, and best practice recommendations. A written personalized care plan for preventive services as well as general preventive health recommendations were provided to patient.     Kellie Simmering, LPN   [id number]   Nurse Notes:

## 2020-03-27 ENCOUNTER — Other Ambulatory Visit: Payer: Self-pay | Admitting: Nurse Practitioner

## 2020-03-27 LAB — TSH: TSH: 2.19 u[IU]/mL (ref 0.450–4.500)

## 2020-03-27 LAB — CMP14+EGFR
ALT: 6 IU/L (ref 0–32)
AST: 19 IU/L (ref 0–40)
Albumin/Globulin Ratio: 1.6 (ref 1.2–2.2)
Albumin: 4.1 g/dL (ref 3.7–4.7)
Alkaline Phosphatase: 70 IU/L (ref 48–121)
BUN/Creatinine Ratio: 12 (ref 12–28)
BUN: 15 mg/dL (ref 8–27)
Bilirubin Total: 0.3 mg/dL (ref 0.0–1.2)
CO2: 28 mmol/L (ref 20–29)
Calcium: 9.7 mg/dL (ref 8.7–10.3)
Chloride: 90 mmol/L — ABNORMAL LOW (ref 96–106)
Creatinine, Ser: 1.23 mg/dL — ABNORMAL HIGH (ref 0.57–1.00)
GFR calc Af Amer: 48 mL/min/{1.73_m2} — ABNORMAL LOW (ref >59)
GFR calc non Af Amer: 42 mL/min/{1.73_m2} — ABNORMAL LOW (ref >59)
Globulin, Total: 2.6 g/dL (ref 1.5–4.5)
Glucose: 81 mg/dL (ref 65–99)
Potassium: 3.8 mmol/L (ref 3.5–5.2)
Sodium: 131 mmol/L — ABNORMAL LOW (ref 134–144)
Total Protein: 6.7 g/dL (ref 6.0–8.5)

## 2020-03-27 LAB — T3, FREE: T3, Free: 2.2 pg/mL (ref 2.0–4.4)

## 2020-03-27 LAB — T4: T4, Total: 10.6 ug/dL (ref 4.5–12.0)

## 2020-03-27 LAB — HEPATITIS C ANTIBODY: Hep C Virus Ab: <0.1 s/co ratio (ref 0.0–0.9)

## 2020-05-01 ENCOUNTER — Ambulatory Visit: Payer: Medicare Other | Admitting: Pulmonary Disease

## 2020-05-01 NOTE — Progress Notes (Deleted)
Synopsis: Referred in April 2021 for lung nodule by [name], FNP  Subjective:   PATIENT ID: Leslie Reeves GENDER: female DOB: 1940-09-29, MRN: [id number]  No chief complaint on file.   This is a 79 year old female past medical history of diabetes and hypertension.  Referred for evaluation of lung nodule.  Patient underwent CT chest on 12/18/2019 which revealed a 3 mm noncalcified right middle lobe pulmonary nodule as well as aortic atherosclerosis and a thyroid goiter.  She is a former smoker, smoked for approximately 15 years only half pack per day.  Patient has no respiratory complaints today.  We reviewed her CT imaging today in the office.  She does state that she has had an asthma diagnosis for the past 20 years.  She uses as needed albuterol.  She has done well with this with no other symptoms.  She states that she has not used her albuterol this past month.  But on occasion she does have some issues and starting to become more apparent with the season change into springtime with more pollen.  OV 05/01/2020: ***   Past Medical History:  Diagnosis Date   Allergy    Diabetes mellitus without complication (HCC)    Hypertension      Family History  Problem Relation Age of Onset   Breast cancer Neg Hx      Past Surgical History:  Procedure Laterality Date   ABDOMINAL HYSTERECTOMY      Social History   Socioeconomic History   Marital status: Married    Spouse name: Not on file   Number of children: Not on file   Years of education: Not on file   Highest education level: Not on file  Occupational History   Occupation: retired  Tobacco Use   Smoking status: Former Smoker    Packs/day: 0.50    Years: 15.00    Pack years: 7.50    Types: Cigarettes    Quit date: 2017    Years since quitting: 4.5   Smokeless tobacco: Never Used  Scientific laboratory technician Use: Never used  Substance and Sexual Activity   Alcohol use: Not Currently   Drug use: Not Currently     Sexual activity: Not Currently  Other Topics Concern   Not on file  Social History Narrative   Not on file   Social Determinants of Health   Financial Resource Strain: Low Risk    Difficulty of Paying Living Expenses: Not hard at all  Food Insecurity: No Food Insecurity   Worried About Charity fundraiser in the Last Year: Never true   Arboriculturist in the Last Year: Never true  Transportation Needs: No Transportation Needs   Lack of Transportation (Medical): No   Lack of Transportation (Non-Medical): No  Physical Activity: Insufficiently Active   Days of Exercise per Week: 2 days   Minutes of Exercise per Session: 60 min  Stress: No Stress Concern Present   Feeling of Stress : Not at all  Social Connections:    Frequency of Communication with Friends and Family:    Frequency of Social Gatherings with Friends and Family:    Attends Religious Services:    Active Member of Clubs or Organizations:    Attends Archivist Meetings:    Marital Status:   Intimate Partner Violence:    Fear of Current or Ex-Partner:    Emotionally Abused:    Physically Abused:    Sexually Abused:  Allergies  Allergen Reactions   Penicillins Rash     Outpatient Medications Prior to Visit  Medication Sig Dispense Refill   albuterol (VENTOLIN HFA) 108 (90 Base) MCG/ACT inhaler INHALE 2 PUFFS BY MOUTH 4 TIMES A DAY 8 g 1   amLODipine (NORVASC) 10 MG tablet TAKE 1 TABLET BY MOUTH EVERY DAY 90 tablet 1   blood glucose meter kit and supplies KIT Dispense based on patient and insurance preference. Use up to four times daily as directed. (FOR ICD-9 250.00, 250.01). 1 each 0   diclofenac sodium (VOLTAREN) 1 % GEL Apply 2 g topically 4 (four) times daily. 100 g 1   glucose blood (ACCU-CHEK AVIVA PLUS) test strip Use as instructed 300 each 3   levothyroxine (SYNTHROID) 25 MCG tablet TAKE 1.5 TABLET (37.5MCG) BY ORAL ROUTE EVERY DAY 135 tablet 0   megestrol  (MEGACE ES) 625 MG/5ML suspension Take 5 mLs (625 mg total) by mouth daily. 150 mL 1   metFORMIN (GLUCOPHAGE) 500 MG tablet TAKE 0.5 TABLETS (250 MG TOTAL) BY MOUTH 2 (TWO) TIMES DAILY. 90 tablet 0   olmesartan-hydrochlorothiazide (BENICAR HCT) 20-12.5 MG tablet TAKE 1 TABLET BY MOUTH DAILY. 90 tablet 0   rosuvastatin (CRESTOR) 20 MG tablet Take 1 tablet (20 mg total) by mouth daily. 90 tablet 0   Vitamin D, Ergocalciferol, (DRISDOL) 1.25 MG (50000 UNIT) CAPS capsule TAKE ONE TWICE A WEEK 24 capsule 1   WIXELA INHUB 100-50 MCG/DOSE AEPB TAKE 1 PUFF BY MOUTH EVERY 12 HOURS IN THE MORNING AND IN THE EVENING 180 each 1   No facility-administered medications prior to visit.    ROS   Objective:  Physical Exam   There were no vitals filed for this visit.   on RA BMI Readings from Last 3 Encounters:  03/26/20 18.09 kg/m  03/26/20 18.09 kg/m  01/16/20 19.46 kg/m   Wt Readings from Last 3 Encounters:  03/26/20 97 lb (44 kg)  03/26/20 97 lb (44 kg)  01/16/20 103 lb (46.7 kg)     CBC    Component Value Date/Time   WBC 5.3 01/02/2020 1134   RBC 4.18 01/02/2020 1134   HGB 12.3 01/02/2020 1134   HCT 36.6 01/02/2020 1134   PLT 384 01/02/2020 1134   MCV 88 01/02/2020 1134   MCH 29.4 01/02/2020 1134   MCHC 33.6 01/02/2020 1134   RDW 17.3 (H) 01/02/2020 1134   LYMPHSABS 2.4 01/02/2020 1134   EOSABS 0.2 01/02/2020 1134   BASOSABS 0.1 01/02/2020 1134     Chest Imaging: 12/18/2019 CT chest: 3 mm noncalcified right middle lobe pulmonary nodule.  Pulmonary Functions Testing Results: No flowsheet data found.  FeNO: None  Pathology: None   Echocardiogram: None   Heart Catheterization: None     Assessment & Plan:   No diagnosis found.  Assessment:   New diagnosis of right middle lobe lung nodule, although subcentimeter in size patient is a former smoker and does not exclude the risk of this developing into a potential malignancy.  Due to her smoking history and age  as well as location of the nodule it is still considered low risk but would recommend 1 year follow-up imaging.  Plan Following Extensive Data Review & Interpretation:   I reviewed prior external note(s) from 01/02/2020, [name], NP primary care office visit  I reviewed the result(s) of 01/02/2020 CBC unremarkable  I have ordered 94-monthfollow-up noncontrasted CT of the chest for lung nodule  Independent interpretation of tests  Review of  patient's 01/02/2020 CT chest images revealed 3 mm right middle lobe lung nodule. The patient's images have been independently reviewed by me.    We appreciate the consultation. Patient return to clinic in 12 months following CT chest imaging.    Current Outpatient Medications:    albuterol (VENTOLIN HFA) 108 (90 Base) MCG/ACT inhaler, INHALE 2 PUFFS BY MOUTH 4 TIMES A DAY, Disp: 8 g, Rfl: 1   amLODipine (NORVASC) 10 MG tablet, TAKE 1 TABLET BY MOUTH EVERY DAY, Disp: 90 tablet, Rfl: 1   blood glucose meter kit and supplies KIT, Dispense based on patient and insurance preference. Use up to four times daily as directed. (FOR ICD-9 250.00, 250.01)., Disp: 1 each, Rfl: 0   diclofenac sodium (VOLTAREN) 1 % GEL, Apply 2 g topically 4 (four) times daily., Disp: 100 g, Rfl: 1   glucose blood (ACCU-CHEK AVIVA PLUS) test strip, Use as instructed, Disp: 300 each, Rfl: 3   levothyroxine (SYNTHROID) 25 MCG tablet, TAKE 1.5 TABLET (37.5MCG) BY ORAL ROUTE EVERY DAY, Disp: 135 tablet, Rfl: 0   megestrol (MEGACE ES) 625 MG/5ML suspension, Take 5 mLs (625 mg total) by mouth daily., Disp: 150 mL, Rfl: 1   metFORMIN (GLUCOPHAGE) 500 MG tablet, TAKE 0.5 TABLETS (250 MG TOTAL) BY MOUTH 2 (TWO) TIMES DAILY., Disp: 90 tablet, Rfl: 0   olmesartan-hydrochlorothiazide (BENICAR HCT) 20-12.5 MG tablet, TAKE 1 TABLET BY MOUTH DAILY., Disp: 90 tablet, Rfl: 0   rosuvastatin (CRESTOR) 20 MG tablet, Take 1 tablet (20 mg total) by mouth daily., Disp: 90 tablet, Rfl: 0    Vitamin D, Ergocalciferol, (DRISDOL) 1.25 MG (50000 UNIT) CAPS capsule, TAKE ONE TWICE A WEEK, Disp: 24 capsule, Rfl: 1   WIXELA INHUB 100-50 MCG/DOSE AEPB, TAKE 1 PUFF BY MOUTH EVERY 12 HOURS IN THE MORNING AND IN THE EVENING, Disp: 180 each, Rfl: 1   Garner Nash, DO [facility] 05/01/2020 1:23 PM

## 2020-05-05 ENCOUNTER — Other Ambulatory Visit: Payer: Self-pay

## 2020-05-05 ENCOUNTER — Encounter: Payer: Self-pay | Admitting: Primary Care

## 2020-05-05 ENCOUNTER — Ambulatory Visit (INDEPENDENT_AMBULATORY_CARE_PROVIDER_SITE_OTHER): Payer: Medicare Other | Admitting: Primary Care

## 2020-05-05 DIAGNOSIS — J4520 Mild intermittent asthma, uncomplicated: Secondary | ICD-10-CM | POA: Insufficient documentation

## 2020-05-05 DIAGNOSIS — R911 Solitary pulmonary nodule: Secondary | ICD-10-CM | POA: Insufficient documentation

## 2020-05-05 NOTE — Patient Instructions (Addendum)
  Recommendations: - No changes today - Continue Wixela one puff twice daily - Use albuterol rescue inhaler 2 puffs as needed every 4-6 hours for shortness of breath or wheezing - Due for repeat CT chest in March 2022 (already ordered)  Follow-up: - Due in April with Dr. Valeta Harms

## 2020-05-05 NOTE — Progress Notes (Signed)
_0  ID: Leslie Reeves, female    DOB: 17-Jun-1941, 79 y.o.   MRN: [id number]  Chief Complaint  Patient presents with  . Follow-up    "feeling okay"    Referring provider: [name], FNP  HPI: 79 year old female, former smoker quit in 2017 (7.5-pack-year history).  Past medical history significant for pulmonary nodule right middle lobe, mild intermittent asthma.  Patient of Dr. Valeta Harms, last seen on 01/16/2020. Recommended fu in 1 year with repeat imaging.   05/05/2020 Patient presents today for 3-4 month follow-up visit. She feels well. No respiratory symptoms. She continues Wixela 1 puff twice daily. She does not require albuterol daily. She gets out of breath walking a long distance, it takes her 10 mins to recover. She states that her weight fluctures depending on what scale she uses. Her appetite has improved recently with Megace. Denies chest tightness, chest pain, wheezing, hemoptysis, cough.   TESTING: . 01/02/2020 CT chest images revealed 3 mm right middle lobe lung nodule.   Allergies  Allergen Reactions  . Penicillins Rash    Immunization History  Administered Date(s) Administered  . Influenza,inj,Quad PF,6+ Mos 07/06/2018  . Influenza-Unspecified 06/27/2013, 06/28/2019  . PFIZER SARS-COV-2 Vaccination 11/17/2019, 12/10/2019    Past Medical History:  Diagnosis Date  . Allergy   . Diabetes mellitus without complication ([place])   . Hypertension     Tobacco History: Social History   Tobacco Use  Smoking Status Former Smoker  . Packs/day: 0.50  . Years: 15.00  . Pack years: 7.50  . Types: Cigarettes  . Quit date: 2017  . Years since quitting: 4.6  Smokeless Tobacco Never Used   Counseling given: Not Answered   Outpatient Medications Prior to Visit  Medication Sig Dispense Refill  . albuterol (VENTOLIN HFA) 108 (90 Base) MCG/ACT inhaler INHALE 2 PUFFS BY MOUTH 4 TIMES A DAY 8 g 1  . amLODipine (NORVASC) 10 MG tablet TAKE 1 TABLET BY MOUTH EVERY DAY 90  tablet 1  . blood glucose meter kit and supplies KIT Dispense based on patient and insurance preference. Use up to four times daily as directed. (FOR ICD-9 250.00, 250.01). 1 each 0  . diclofenac sodium (VOLTAREN) 1 % GEL Apply 2 g topically 4 (four) times daily. 100 g 1  . glucose blood (ACCU-CHEK AVIVA PLUS) test strip Use as instructed 300 each 3  . levothyroxine (SYNTHROID) 25 MCG tablet TAKE 1.5 TABLET (37.5MCG) BY ORAL ROUTE EVERY DAY 135 tablet 0  . megestrol (MEGACE ES) 625 MG/5ML suspension Take 5 mLs (625 mg total) by mouth daily. 150 mL 1  . metFORMIN (GLUCOPHAGE) 500 MG tablet TAKE 0.5 TABLETS (250 MG TOTAL) BY MOUTH 2 (TWO) TIMES DAILY. 90 tablet 0  . olmesartan-hydrochlorothiazide (BENICAR HCT) 20-12.5 MG tablet TAKE 1 TABLET BY MOUTH DAILY. 90 tablet 0  . rosuvastatin (CRESTOR) 20 MG tablet Take 1 tablet (20 mg total) by mouth daily. 90 tablet 0  . Vitamin D, Ergocalciferol, (DRISDOL) 1.25 MG (50000 UNIT) CAPS capsule TAKE ONE TWICE A WEEK 24 capsule 1  . WIXELA INHUB 100-50 MCG/DOSE AEPB TAKE 1 PUFF BY MOUTH EVERY 12 HOURS IN THE MORNING AND IN THE EVENING 180 each 1   No facility-administered medications prior to visit.    Review of Systems  Review of Systems  Constitutional: Positive for unexpected weight change. Negative for appetite change.       Appetite has improved; weight as stabilized   Respiratory: Negative for cough, shortness of breath and wheezing.  Dyspnea on exertion    Physical Exam  BP (!) 148/76 (BP Location: Right Arm, Cuff Size: Normal)   Pulse 82   Temp 98.3 F (36.8 C)   Ht 5' (1.524 m)   Wt 98 lb (44.5 kg)   SpO2 98%   BMI 19.14 kg/m  Physical Exam Constitutional:      General: She is not in acute distress.    Appearance: Normal appearance.     Comments: Underweight  Cardiovascular:     Rate and Rhythm: Normal rate and regular rhythm.  Pulmonary:     Effort: Pulmonary effort is normal.     Breath sounds: Normal breath sounds.   Musculoskeletal:        General: Normal range of motion.  Skin:    General: Skin is warm and dry.  Neurological:     Mental Status: She is alert.      Lab Results:  CBC    Component Value Date/Time   WBC 5.3 01/02/2020 1134   RBC 4.18 01/02/2020 1134   HGB 12.3 01/02/2020 1134   HCT 36.6 01/02/2020 1134   PLT 384 01/02/2020 1134   MCV 88 01/02/2020 1134   MCH 29.4 01/02/2020 1134   MCHC 33.6 01/02/2020 1134   RDW 17.3 (H) 01/02/2020 1134   LYMPHSABS 2.4 01/02/2020 1134   EOSABS 0.2 01/02/2020 1134   BASOSABS 0.1 01/02/2020 1134    BMET    Component Value Date/Time   NA 131 (L) 03/26/2020 1134   K 3.8 03/26/2020 1134   CL 90 (L) 03/26/2020 1134   CO2 28 03/26/2020 1134   GLUCOSE 81 03/26/2020 1134   BUN 15 03/26/2020 1134   CREATININE 1.23 (H) 03/26/2020 1134   CALCIUM 9.7 03/26/2020 1134   GFRNONAA 42 (L) 03/26/2020 1134   GFRAA 48 (L) 03/26/2020 1134    BNP No results found for: BNP  ProBNP No results found for: PROBNP  Imaging: No results found.   Assessment & Plan:   Solitary pulmonary nodule - Former smoker. No acute respiratory symptoms or hemoptysis. Weight loss has stabilized with Megace  - CT chest 01/02/20 showed 1m RML nodule, needs 1 year follow-up (due in March 2022)  Mild intermittent asthma - Well controlled on Wixela 1 puff twice daily; rare SABA use  - No changes today - FU in April 2022 with Dr. IValeta Harmsor sooner if needed   EMartyn Ehrich NP 05/05/2020

## 2020-05-05 NOTE — Assessment & Plan Note (Addendum)
-  Former smoker. No acute respiratory symptoms or hemoptysis. Weight loss has stabilized with Megace  - CT chest 01/02/20 showed 52m RML nodule, needs 1 year follow-up (due in March 2022)

## 2020-05-05 NOTE — Assessment & Plan Note (Addendum)
-  Well controlled on Wixela 1 puff twice daily; rare SABA use  - No changes today - FU in April 2022 with Dr. Valeta Harms or sooner if needed

## 2020-05-07 ENCOUNTER — Other Ambulatory Visit: Payer: Self-pay | Admitting: Nurse Practitioner

## 2020-05-26 DIAGNOSIS — N183 Chronic kidney disease, stage 3 unspecified: Secondary | ICD-10-CM | POA: Diagnosis not present

## 2020-05-27 ENCOUNTER — Other Ambulatory Visit: Payer: Self-pay | Admitting: Nurse Practitioner

## 2020-05-27 DIAGNOSIS — K5904 Chronic idiopathic constipation: Secondary | ICD-10-CM | POA: Diagnosis not present

## 2020-05-27 DIAGNOSIS — Z86010 Personal history of colonic polyps: Secondary | ICD-10-CM | POA: Diagnosis not present

## 2020-05-27 DIAGNOSIS — R634 Abnormal weight loss: Secondary | ICD-10-CM | POA: Diagnosis not present

## 2020-05-27 DIAGNOSIS — K5730 Diverticulosis of large intestine without perforation or abscess without bleeding: Secondary | ICD-10-CM | POA: Diagnosis not present

## 2020-05-29 DIAGNOSIS — I129 Hypertensive chronic kidney disease with stage 1 through stage 4 chronic kidney disease, or unspecified chronic kidney disease: Secondary | ICD-10-CM | POA: Diagnosis not present

## 2020-05-29 DIAGNOSIS — N1831 Chronic kidney disease, stage 3a: Secondary | ICD-10-CM | POA: Diagnosis not present

## 2020-05-29 DIAGNOSIS — E1122 Type 2 diabetes mellitus with diabetic chronic kidney disease: Secondary | ICD-10-CM | POA: Diagnosis not present

## 2020-05-29 DIAGNOSIS — E673 Hypervitaminosis D: Secondary | ICD-10-CM | POA: Diagnosis not present

## 2020-05-31 ENCOUNTER — Other Ambulatory Visit: Payer: Self-pay | Admitting: Nurse Practitioner

## 2020-06-04 ENCOUNTER — Other Ambulatory Visit: Payer: Self-pay | Admitting: Internal Medicine

## 2020-06-04 DIAGNOSIS — Z1231 Encounter for screening mammogram for malignant neoplasm of breast: Secondary | ICD-10-CM

## 2020-06-09 ENCOUNTER — Other Ambulatory Visit: Payer: Self-pay | Admitting: Nurse Practitioner

## 2020-06-09 DIAGNOSIS — E119 Type 2 diabetes mellitus without complications: Secondary | ICD-10-CM | POA: Diagnosis not present

## 2020-06-09 DIAGNOSIS — H524 Presbyopia: Secondary | ICD-10-CM | POA: Diagnosis not present

## 2020-06-09 LAB — HM DIABETES EYE EXAM

## 2020-06-13 DIAGNOSIS — H35033 Hypertensive retinopathy, bilateral: Secondary | ICD-10-CM | POA: Diagnosis not present

## 2020-06-19 ENCOUNTER — Encounter: Payer: Self-pay | Admitting: Nurse Practitioner

## 2020-06-26 ENCOUNTER — Ambulatory Visit (INDEPENDENT_AMBULATORY_CARE_PROVIDER_SITE_OTHER): Payer: Medicare Other | Admitting: Nurse Practitioner

## 2020-06-26 ENCOUNTER — Other Ambulatory Visit: Payer: Self-pay

## 2020-06-26 ENCOUNTER — Other Ambulatory Visit: Payer: Self-pay | Admitting: Nurse Practitioner

## 2020-06-26 ENCOUNTER — Encounter: Payer: Self-pay | Admitting: Nurse Practitioner

## 2020-06-26 VITALS — BP 142/68 | HR 62 | Temp 98.2°F | Ht 61.4 in | Wt 96.2 lb

## 2020-06-26 DIAGNOSIS — Z79899 Other long term (current) drug therapy: Secondary | ICD-10-CM | POA: Diagnosis not present

## 2020-06-26 DIAGNOSIS — R7303 Prediabetes: Secondary | ICD-10-CM | POA: Diagnosis not present

## 2020-06-26 DIAGNOSIS — R630 Anorexia: Secondary | ICD-10-CM | POA: Diagnosis not present

## 2020-06-26 DIAGNOSIS — Z23 Encounter for immunization: Secondary | ICD-10-CM

## 2020-06-26 DIAGNOSIS — I10 Essential (primary) hypertension: Secondary | ICD-10-CM

## 2020-06-26 MED ORDER — ROSUVASTATIN CALCIUM 20 MG PO TABS: 20.0000 mg | ORAL_TABLET | Freq: Every day | ORAL | 0 refills | Status: DC

## 2020-06-26 MED ORDER — MIRTAZAPINE 15 MG PO TABS: 15.0000 mg | ORAL_TABLET | Freq: Every day | ORAL | 2 refills | Status: DC

## 2020-06-26 NOTE — Progress Notes (Signed)
I,Yamilka Roman [employer] as a Education administrator for [employer], FNP.,have documented all relevant documentation on the behalf of [name], FNP,as directed by  [name], FNP while in the presence of [name], [place]. This visit occurred during the SARS-CoV-2 public health emergency.  Safety protocols were in place, including screening questions prior to the visit, additional usage of staff PPE, and extensive cleaning of exam room while observing appropriate contact time as indicated for disinfecting solutions.  Subjective:     Patient ID: Leslie Reeves , female    DOB: 04-11-1941 , 79 y.o.   MRN: [id number]   Chief Complaint  Patient presents with  . Hypertension  . Prediabetes    HPI  Here for her 4 month blood pressure follow up   Wt Readings from Last 3 Encounters: 06/26/20 : 96 lb 3.2 oz (43.6 kg) 05/05/20 : 98 lb (44.5 kg) 03/26/20 : 97 lb (44 kg)  She is being followed by Dr. Valeta Harms at [facility].     Hypertension This is a chronic problem. The current episode started more than 1 year ago. The problem is unchanged. The problem is controlled (fairly). Pertinent negatives include no anxiety, chest pain, headaches, palpitations or shortness of breath. There are no associated agents to hypertension. Risk factors for coronary artery disease include sedentary lifestyle. Past treatments include diuretics and angiotensin blockers. The current treatment provides no improvement. There are no compliance problems.  There is no history of chronic renal disease.     Past Medical History:  Diagnosis Date  . Allergy   . Diabetes mellitus without complication ([place])   . Hypertension      Family History  Problem Relation Age of Onset  . Breast cancer Neg Hx      Current Outpatient Medications:  .  albuterol (VENTOLIN HFA) 108 (90 Base) MCG/ACT inhaler, INHALE 2 PUFFS BY MOUTH 4 TIMES A DAY, Disp: 3 each, Rfl: 1 .  amLODipine (NORVASC) 10 MG tablet, TAKE 1 TABLET BY MOUTH  EVERY DAY, Disp: 90 tablet, Rfl: 1 .  blood glucose meter kit and supplies KIT, Dispense based on patient and insurance preference. Use up to four times daily as directed. (FOR ICD-9 250.00, 250.01)., Disp: 1 each, Rfl: 0 .  diclofenac sodium (VOLTAREN) 1 % GEL, Apply 2 g topically 4 (four) times daily., Disp: 100 g, Rfl: 1 .  glucose blood (ACCU-CHEK AVIVA PLUS) test strip, Use as instructed, Disp: 300 each, Rfl: 3 .  megestrol (MEGACE ES) 625 MG/5ML suspension, Take 5 mLs (625 mg total) by mouth daily., Disp: 150 mL, Rfl: 1 .  metFORMIN (GLUCOPHAGE) 500 MG tablet, TAKE 0.5 TABLETS (250 MG TOTAL) BY MOUTH 2 (TWO) TIMES DAILY., Disp: 90 tablet, Rfl: 0 .  rosuvastatin (CRESTOR) 20 MG tablet, Take 1 tablet (20 mg total) by mouth daily., Disp: 90 tablet, Rfl: 0 .  WIXELA INHUB 100-50 MCG/DOSE AEPB, TAKE 1 PUFF BY MOUTH EVERY 12 HOURS IN THE MORNING AND IN THE EVENING, Disp: 180 each, Rfl: 1 .  levothyroxine (SYNTHROID) 25 MCG tablet, TAKE 1.5 TABLET (37.5MCG) BY ORAL ROUTE EVERY DAY, Disp: 135 tablet, Rfl: 0 .  mirtazapine (REMERON) 15 MG tablet, Take 1 tablet (15 mg total) by mouth at bedtime., Disp: 30 tablet, Rfl: 2 .  olmesartan-hydrochlorothiazide (BENICAR HCT) 20-12.5 MG tablet, Take 1 tablet by mouth daily., Disp: 90 tablet, Rfl: 1   Allergies  Allergen Reactions  . Penicillins Rash     Review of Systems  Constitutional: Negative.  Eyes: Negative for visual disturbance.  Respiratory: Negative.  Negative for shortness of breath.   Cardiovascular: Negative.  Negative for chest pain, palpitations and leg swelling.  Gastrointestinal: Negative.   Endocrine: Negative.   Musculoskeletal: Negative.   Skin: Negative.   Neurological: Negative for dizziness, weakness and headaches.  Psychiatric/Behavioral: Negative for confusion. The patient is not nervous/anxious.      Today's Vitals   06/26/20 0855  BP: (!) 142/68  Pulse: 62  Temp: 98.2 F (36.8 C)  TempSrc: Oral  Weight: 96 lb 3.2  oz (43.6 kg)  Height: 5' 1.4" (1.56 m)  PainSc: 0-No pain   Body mass index is 17.94 kg/m.   Objective:  Physical Exam Vitals reviewed.  Constitutional:      General: She is not in acute distress.    Appearance: Normal appearance. She is well-developed.     Comments: Thin frame  HENT:     Head: Normocephalic and atraumatic.  Eyes:     Pupils: Pupils are equal, round, and reactive to light.  Cardiovascular:     Rate and Rhythm: Normal rate and regular rhythm.     Pulses: Normal pulses.     Heart sounds: Normal heart sounds. No murmur heard.   Pulmonary:     Effort: Pulmonary effort is normal. No respiratory distress.     Breath sounds: Normal breath sounds.  Skin:    General: Skin is warm and dry.     Capillary Refill: Capillary refill takes less than 2 seconds.  Neurological:     General: No focal deficit present.     Mental Status: She is alert and oriented to person, place, and time.     Cranial Nerves: No cranial nerve deficit.  Psychiatric:        Mood and Affect: Mood normal.        Behavior: Behavior normal.        Thought Content: Thought content normal.        Judgment: Judgment normal.         Assessment And Plan:     1. Prediabetes  Chronic, stable  Continue to avoid sugary foods and drinks - CMP14+EGFR  2. Essential hypertension   Chronic, fair control  Continue with current medications - CMP14+EGFR  3. Need for influenza vaccination  Influenza vaccine administered  Encouraged to take Tylenol as needed for fever or muscle aches.  4. Decreased appetite  Weight has remained stable and is tolerating well - mirtazapine (REMERON) 15 MG tablet; Take 1 tablet (15 mg total) by mouth at bedtime.  Dispense: 30 tablet; Refill: 2  5. Other long term (current) drug therapy - TSH - T4 - T3, free     Patient was given opportunity to ask questions. Patient verbalized understanding of the plan and was able to repeat key elements of the plan. All  questions were answered to their satisfaction.   Teola Bradley, FNP, have reviewed all documentation for this visit. The documentation on 06/29/20 for the exam, diagnosis, procedures, and orders are all accurate and complete.  THE PATIENT IS ENCOURAGED TO PRACTICE SOCIAL DISTANCING DUE TO THE COVID-19 PANDEMIC.

## 2020-06-26 NOTE — Patient Instructions (Signed)

## 2020-06-27 ENCOUNTER — Other Ambulatory Visit: Payer: Self-pay | Admitting: Nurse Practitioner

## 2020-06-27 LAB — T3, FREE: T3, Free: 2.2 pg/mL (ref 2.0–4.4)

## 2020-06-27 LAB — CMP14+EGFR
ALT: 7 IU/L (ref 0–32)
AST: 19 IU/L (ref 0–40)
Albumin/Globulin Ratio: 1.6 (ref 1.2–2.2)
Albumin: 4 g/dL (ref 3.7–4.7)
Alkaline Phosphatase: 63 IU/L (ref 44–121)
BUN/Creatinine Ratio: 13 (ref 12–28)
BUN: 15 mg/dL (ref 8–27)
Bilirubin Total: 0.2 mg/dL (ref 0.0–1.2)
CO2: 26 mmol/L (ref 20–29)
Calcium: 9.8 mg/dL (ref 8.7–10.3)
Chloride: 89 mmol/L — ABNORMAL LOW (ref 96–106)
Creatinine, Ser: 1.12 mg/dL — ABNORMAL HIGH (ref 0.57–1.00)
GFR calc Af Amer: 54 mL/min/{1.73_m2} — ABNORMAL LOW (ref >59)
GFR calc non Af Amer: 47 mL/min/{1.73_m2} — ABNORMAL LOW (ref >59)
Globulin, Total: 2.5 g/dL (ref 1.5–4.5)
Glucose: 88 mg/dL (ref 65–99)
Potassium: 3.8 mmol/L (ref 3.5–5.2)
Sodium: 130 mmol/L — ABNORMAL LOW (ref 134–144)
Total Protein: 6.5 g/dL (ref 6.0–8.5)

## 2020-06-27 LAB — TSH: TSH: 1.66 u[IU]/mL (ref 0.450–4.500)

## 2020-06-27 LAB — T4: T4, Total: 11.4 ug/dL (ref 4.5–12.0)

## 2020-06-29 ENCOUNTER — Other Ambulatory Visit: Payer: Self-pay | Admitting: Nurse Practitioner

## 2020-06-29 ENCOUNTER — Encounter: Payer: Self-pay | Admitting: Nurse Practitioner

## 2020-06-29 MED ORDER — OLMESARTAN MEDOXOMIL-HCTZ 20-12.5 MG PO TABS: 1.0000 | ORAL_TABLET | Freq: Every day | ORAL | 1 refills | Status: DC

## 2020-06-30 ENCOUNTER — Other Ambulatory Visit: Payer: Self-pay

## 2020-06-30 MED ORDER — OLMESARTAN MEDOXOMIL 20 MG PO TABS: 20.0000 mg | ORAL_TABLET | Freq: Every day | ORAL | 2 refills | Status: DC

## 2020-07-02 ENCOUNTER — Telehealth: Payer: Self-pay

## 2020-07-02 NOTE — Telephone Encounter (Signed)
I returned the pt's daughter's call and notified her that she is supposed to now be taking the plain olmesartan without the hctz included.

## 2020-07-14 ENCOUNTER — Other Ambulatory Visit: Payer: Self-pay

## 2020-07-14 ENCOUNTER — Ambulatory Visit
Admission: RE | Admit: 2020-07-14 | Discharge: 2020-07-14 | Disposition: A | Discharge status: Home / Self Care | Payer: Medicare Other | Source: Ambulatory Visit | Attending: Internal Medicine | Admitting: Internal Medicine

## 2020-07-14 DIAGNOSIS — Z1231 Encounter for screening mammogram for malignant neoplasm of breast: Secondary | ICD-10-CM

## 2020-07-31 ENCOUNTER — Ambulatory Visit: Payer: Self-pay | Admitting: Nurse Practitioner

## 2020-08-04 ENCOUNTER — Other Ambulatory Visit: Payer: Self-pay | Admitting: Nurse Practitioner

## 2020-08-06 ENCOUNTER — Other Ambulatory Visit: Payer: Self-pay | Admitting: Nurse Practitioner

## 2020-09-11 ENCOUNTER — Other Ambulatory Visit: Payer: Self-pay | Admitting: Nurse Practitioner

## 2020-09-11 DIAGNOSIS — R630 Anorexia: Secondary | ICD-10-CM

## 2020-09-29 ENCOUNTER — Ambulatory Visit: Payer: Medicare Other | Admitting: Nurse Practitioner

## 2020-11-02 ENCOUNTER — Other Ambulatory Visit: Payer: Self-pay | Admitting: Nurse Practitioner

## 2020-12-06 ENCOUNTER — Other Ambulatory Visit: Payer: Self-pay | Admitting: Nurse Practitioner

## 2020-12-09 ENCOUNTER — Other Ambulatory Visit: Payer: Self-pay | Admitting: Nurse Practitioner

## 2020-12-10 ENCOUNTER — Other Ambulatory Visit: Payer: Self-pay | Admitting: Nurse Practitioner

## 2020-12-25 ENCOUNTER — Ambulatory Visit: Payer: Self-pay | Admitting: Nurse Practitioner

## 2020-12-28 ENCOUNTER — Other Ambulatory Visit: Payer: Self-pay | Admitting: Nurse Practitioner

## 2020-12-30 ENCOUNTER — Ambulatory Visit
Admission: RE | Admit: 2020-12-30 | Discharge: 2020-12-30 | Disposition: A | Discharge status: Home / Self Care | Payer: Medicare Other | Source: Ambulatory Visit | Attending: Pulmonary Disease | Admitting: Pulmonary Disease

## 2020-12-30 DIAGNOSIS — Z87891 Personal history of nicotine dependence: Secondary | ICD-10-CM

## 2020-12-30 DIAGNOSIS — R911 Solitary pulmonary nodule: Secondary | ICD-10-CM

## 2020-12-30 DIAGNOSIS — R0602 Shortness of breath: Secondary | ICD-10-CM | POA: Diagnosis not present

## 2021-01-20 ENCOUNTER — Other Ambulatory Visit: Payer: Self-pay

## 2021-01-20 ENCOUNTER — Ambulatory Visit (INDEPENDENT_AMBULATORY_CARE_PROVIDER_SITE_OTHER): Payer: Medicare Other | Admitting: Nurse Practitioner

## 2021-01-20 ENCOUNTER — Encounter: Payer: Self-pay | Admitting: Nurse Practitioner

## 2021-01-20 VITALS — BP 140/72 | HR 72 | Temp 98.2°F | Ht 61.6 in | Wt 88.6 lb

## 2021-01-20 DIAGNOSIS — R630 Anorexia: Secondary | ICD-10-CM

## 2021-01-20 DIAGNOSIS — R600 Localized edema: Secondary | ICD-10-CM

## 2021-01-20 DIAGNOSIS — R0902 Hypoxemia: Secondary | ICD-10-CM | POA: Diagnosis not present

## 2021-01-20 DIAGNOSIS — R0609 Other forms of dyspnea: Secondary | ICD-10-CM

## 2021-01-20 DIAGNOSIS — I10 Essential (primary) hypertension: Secondary | ICD-10-CM | POA: Diagnosis not present

## 2021-01-20 DIAGNOSIS — R413 Other amnesia: Secondary | ICD-10-CM

## 2021-01-20 DIAGNOSIS — R7303 Prediabetes: Secondary | ICD-10-CM | POA: Diagnosis not present

## 2021-01-20 DIAGNOSIS — E041 Nontoxic single thyroid nodule: Secondary | ICD-10-CM | POA: Diagnosis not present

## 2021-01-20 DIAGNOSIS — Z23 Encounter for immunization: Secondary | ICD-10-CM

## 2021-01-20 DIAGNOSIS — E2839 Other primary ovarian failure: Secondary | ICD-10-CM

## 2021-01-20 DIAGNOSIS — R0600 Dyspnea, unspecified: Secondary | ICD-10-CM

## 2021-01-20 DIAGNOSIS — R634 Abnormal weight loss: Secondary | ICD-10-CM | POA: Diagnosis not present

## 2021-01-20 DIAGNOSIS — R270 Ataxia, unspecified: Secondary | ICD-10-CM

## 2021-01-20 MED ORDER — SHINGRIX 50 MCG/0.5ML IM SUSR: 0.5000 mL | Freq: Once | INTRAMUSCULAR | 0 refills | Status: AC

## 2021-01-20 MED ORDER — PNEUMOCOCCAL 13-VAL CONJ VACC IM SUSP: 0.5000 mL | INTRAMUSCULAR | 0 refills | Status: AC

## 2021-01-20 NOTE — Patient Instructions (Signed)

## 2021-01-20 NOTE — Progress Notes (Signed)
I,Yamilka Roman [employer] as a Education administrator for [employer], FNP.,have documented all relevant documentation on the behalf of [name], FNP,as directed by  [name], FNP while in the presence of [name], [place]. This visit occurred during the SARS-CoV-2 public health emergency.  Safety protocols were in place, including screening questions prior to the visit, additional usage of staff PPE, and extensive cleaning of exam room while observing appropriate contact time as indicated for disinfecting solutions.  Subjective:     Patient ID: Leslie Reeves , female    DOB: 1941-06-19 , 80 y.o.   MRN: [id number]   Chief Complaint  Patient presents with  . Hypertension  . Prediabetes  . Weight Check    Patient stated her appetite is good at night   . memory concerns    HPI  Patient presents today for a f/u on her prediabetes and blood pressure. Her daughter stated she had a few concerns regarding her balance and memory and weight loss. Patient reports her appetite is better at night.  Daughter is concerned about her sleeping more.  Her breathing has worsened since the Winter.  Her daughter reports having more forgetfulness with appts, having to repeat herself.    Wt Readings from Last 3 Encounters: 01/20/21 : 88 lb 9.6 oz (40.2 kg) 06/26/20 : 96 lb 3.2 oz (43.6 kg) 05/05/20 : 98 lb (44.5 kg)  She reports she eats at night. She will cough up mucous and has a dry cough.  Lives alone. She has had one fall off the steps and has multiple episodes of near falls. Her daughter [name] is present and states she does not feel her mother is eating as good as the patient says.   Hypertension Associated symptoms include shortness of breath (after taking 5 steps will seem exhausted. ). Pertinent negatives include no anxiety, chest pain, headaches, orthopnea (will sleep with pillows propped up ), palpitations or peripheral edema.     Past Medical History:  Diagnosis Date  . Allergy   . Diabetes  mellitus without complication ([place])   . Hypertension      Family History  Problem Relation Age of Onset  . Breast cancer Neg Hx      Current Outpatient Medications:  .  albuterol (VENTOLIN HFA) 108 (90 Base) MCG/ACT inhaler, INHALE 2 PUFFS BY MOUTH 4 TIMES A DAY, Disp: 20.1 each, Rfl: 1 .  amLODipine (NORVASC) 10 MG tablet, TAKE 1 TABLET BY MOUTH EVERY DAY, Disp: 90 tablet, Rfl: 1 .  blood glucose meter kit and supplies KIT, Dispense based on patient and insurance preference. Use up to four times daily as directed. (FOR ICD-9 250.00, 250.01)., Disp: 1 each, Rfl: 0 .  diclofenac sodium (VOLTAREN) 1 % GEL, Apply 2 g topically 4 (four) times daily., Disp: 100 g, Rfl: 1 .  glucose blood (ACCU-CHEK AVIVA PLUS) test strip, Use as instructed, Disp: 300 each, Rfl: 3 .  levothyroxine (SYNTHROID) 25 MCG tablet, TAKE 1 & 1/2 TABLETS BY MOUTH EVERY DAY, Disp: 135 tablet, Rfl: 0 .  megestrol (MEGACE ES) 625 MG/5ML suspension, Take 5 mLs (625 mg total) by mouth daily., Disp: 150 mL, Rfl: 1 .  mirtazapine (REMERON) 15 MG tablet, TAKE 1 TABLET BY MOUTH EVERYDAY AT BEDTIME, Disp: 90 tablet, Rfl: 1 .  olmesartan (BENICAR) 20 MG tablet, TAKE 1 TABLET BY MOUTH EVERY DAY, Disp: 90 tablet, Rfl: 1 .  rosuvastatin (CRESTOR) 20 MG tablet, TAKE 1 TABLET BY MOUTH EVERY DAY, Disp: 90 tablet, Rfl: 0 .  metFORMIN (GLUCOPHAGE) 500 MG tablet, TAKE 1/2 TABLET BY MOUTH 2 TIMES DAILY, Disp: 90 tablet, Rfl: 0 .  WIXELA INHUB 100-50 MCG/ACT AEPB, TAKE 1 PUFF BY MOUTH EVERY 12 HOURS IN THE MORNING AND IN THE EVENING, Disp: 180 each, Rfl: 1   Allergies  Allergen Reactions  . Penicillins Rash     Review of Systems  Respiratory: Positive for cough and shortness of breath (after taking 5 steps will seem exhausted. ). Negative for chest tightness.   Cardiovascular: Negative for chest pain, palpitations, orthopnea (will sleep with pillows propped up ) and leg swelling.  Neurological: Negative for headaches.     Today's  Vitals   01/20/21 1044  BP: 140/72  Pulse: 72  Temp: 98.2 F (36.8 C)  TempSrc: Oral  SpO2: (!) 86%  Weight: 88 lb 9.6 oz (40.2 kg)  Height: 5' 1.6" (1.565 m)  PainSc: 0-No pain   Body mass index is 16.42 kg/m.   Objective:  Physical Exam Vitals reviewed.  Constitutional:      Appearance: Normal appearance.  Cardiovascular:     Rate and Rhythm: Normal rate and regular rhythm.     Pulses: Normal pulses.     Heart sounds: Normal heart sounds. No murmur heard.   Pulmonary:     Effort: Pulmonary effort is normal. No respiratory distress.     Breath sounds: Normal breath sounds.  Skin:    Capillary Refill: Capillary refill takes less than 2 seconds.  Neurological:     General: No focal deficit present.     Mental Status: She is alert and oriented to person, place, and time.     Cranial Nerves: No cranial nerve deficit.  Psychiatric:        Mood and Affect: Mood normal.        Behavior: Behavior normal.        Thought Content: Thought content normal.        Judgment: Judgment normal.         Assessment And Plan:     1. Prediabetes  Chronic, stable - CMP14+EGFR - CBC  2. Essential hypertension  Chronic, fair control  Continue with current medications   I will refer to CCM for education - AMB Referral to [place]  3. Decreased appetite  She has not been eating as well.   I will check for metabolic causes if these are normal will consider referral to GI  - AMB Referral to [facility]  4. Abnormal weight loss Wt Readings from Last 3 Encounters:  01/20/21 88 lb 9.6 oz (40.2 kg)  06/26/20 96 lb 3.2 oz (43.6 kg)  05/05/20 98 lb (44.5 kg)   - AMB Referral to [place]  5. Right thyroid nodule  Will check ultrasound - US THYROID; Future  6. Lower extremity edema  Will check BNP   Encouraged to increase lower extremities and wear support hose - Brain natriuretic peptide  7. Dyspnea  on exertion  Oxygen level down to 86% placed 2 l/m oxygen on patient increased to 94%.   Will order oxygen for home use - Brain natriuretic peptide - DG Chest 2 View; Future  8. Ataxia  Will check CT scan of head due to change in her gait - CT Head Wo Contrast; Future  9. Memory change  Will check vitamin B12 and thyroid levels  6CIT is 8 was 0 in 2021 - Vitamin B12 - TSH  10. Encounter for immunization - pneumococcal 13-valent  conjugate vaccine (PREVNAR 13) SUSP injection; Inject 0.5 mLs into the muscle tomorrow at 10 am for 1 dose.  Dispense: 0.5 mL; Refill: 0  11. Decreased estrogen level - DG Bone Density; Future  12. Hypoxia  Will start her on oxygen and she is to follow up with pulmonary     Patient was given opportunity to ask questions. Patient verbalized understanding of the plan and was able to repeat key elements of the plan. All questions were answered to their satisfaction.  [name], FNP    I, [name], FNP, have reviewed all documentation for this visit. The documentation on 01/20/21 for the exam, diagnosis, procedures, and orders are all accurate and complete.   IF YOU HAVE BEEN REFERRED TO A SPECIALIST, IT MAY TAKE 1-2 WEEKS TO SCHEDULE/PROCESS THE REFERRAL. IF YOU HAVE NOT HEARD FROM US/SPECIALIST IN TWO WEEKS, PLEASE GIVE [place] A CALL AT [phone].   THE PATIENT IS ENCOURAGED TO PRACTICE SOCIAL DISTANCING DUE TO THE COVID-19 PANDEMIC.

## 2021-01-21 ENCOUNTER — Telehealth: Payer: Self-pay | Admitting: *Deleted

## 2021-01-21 LAB — CMP14+EGFR
ALT: 8 IU/L (ref 0–32)
AST: 17 IU/L (ref 0–40)
Albumin/Globulin Ratio: 1.6 (ref 1.2–2.2)
Albumin: 3.8 g/dL (ref 3.7–4.7)
Alkaline Phosphatase: 56 IU/L (ref 44–121)
BUN/Creatinine Ratio: 8 — ABNORMAL LOW (ref 12–28)
BUN: 11 mg/dL (ref 8–27)
Bilirubin Total: 0.3 mg/dL (ref 0.0–1.2)
CO2: 28 mmol/L (ref 20–29)
Calcium: 9.4 mg/dL (ref 8.7–10.3)
Chloride: 99 mmol/L (ref 96–106)
Creatinine, Ser: 1.34 mg/dL — ABNORMAL HIGH (ref 0.57–1.00)
Globulin, Total: 2.4 g/dL (ref 1.5–4.5)
Glucose: 85 mg/dL (ref 65–99)
Potassium: 3.4 mmol/L — ABNORMAL LOW (ref 3.5–5.2)
Sodium: 143 mmol/L (ref 134–144)
Total Protein: 6.2 g/dL (ref 6.0–8.5)
eGFR: 40 mL/min/{1.73_m2} — ABNORMAL LOW (ref >59)

## 2021-01-21 LAB — CBC
Hematocrit: 38.5 % (ref 34.0–46.6)
Hemoglobin: 11.9 g/dL (ref 11.1–15.9)
MCH: 25.5 pg — ABNORMAL LOW (ref 26.6–33.0)
MCHC: 30.9 g/dL — ABNORMAL LOW (ref 31.5–35.7)
MCV: 82 fL (ref 79–97)
Platelets: 328 10*3/uL (ref 150–450)
RBC: 4.67 x10E6/uL (ref 3.77–5.28)
RDW: 18.9 % — ABNORMAL HIGH (ref 11.7–15.4)
WBC: 5 10*3/uL (ref 3.4–10.8)

## 2021-01-21 LAB — TSH: TSH: 4.23 u[IU]/mL (ref 0.450–4.500)

## 2021-01-21 LAB — VITAMIN B12: Vitamin B-12: 169 pg/mL — ABNORMAL LOW (ref 232–1245)

## 2021-01-21 LAB — BRAIN NATRIURETIC PEPTIDE: BNP: 47.3 pg/mL (ref 0.0–100.0)

## 2021-01-21 NOTE — Chronic Care Management (AMB) (Signed)
  Chronic Care Management   Note  01/21/2021 Name: ELOINA ERGLE MRN: [id number] DOB: Nov 14, 1940  Ceasar Mons Debellis is a 80 y.o. year old female who is a primary care patient of [name], [place]. I reached out to [name] by phone today in response to a referral sent by Ms. Effie K Lawrie's PCP, [name], FNP.     Ms. Tallie was given information about Chronic Care Management services today including:  1. CCM service includes personalized support from designated clinical staff supervised by her physician, including individualized plan of care and coordination with other care providers 2. 24/7 contact phone numbers for assistance for urgent and routine care needs. 3. Service will only be billed when office clinical staff spend 20 minutes or more in a month to coordinate care. 4. Only one practitioner may furnish and bill the service in a calendar month. 5. The patient may stop CCM services at any time (effective at the end of the month) by phone call to the office staff. 6. The patient will be responsible for cost sharing (co-pay) of up to 20% of the service fee (after annual deductible is met).  Patient agreed to services and verbal consent obtained.   Follow up plan: Telephone appointment with care management team member scheduled for:02/02/2021  [facility]

## 2021-01-28 ENCOUNTER — Other Ambulatory Visit: Payer: Medicare Other

## 2021-02-02 ENCOUNTER — Telehealth: Payer: Self-pay

## 2021-02-02 ENCOUNTER — Encounter: Payer: Self-pay | Admitting: Nurse Practitioner

## 2021-02-02 ENCOUNTER — Telehealth: Payer: Medicare Other

## 2021-02-02 NOTE — Telephone Encounter (Signed)
  Chronic Care Management   Outreach Note  02/02/2021 Name: Leslie Reeves MRN: [id number] DOB: Jan 25, 1941  Referred by: [name], FNP Reason for referral : Chronic Care Management (Unsuccessful call)   An unsuccessful telephone outreach was attempted today. The patient was referred to the case management team for assistance with care management and care coordination. The patients phone rang several times then asked for a remote access code. SW unable to leave a voice message.  Follow Up Plan: The care management team will reach out to the patient again over the next 30 days.   [name], BSW, CDP Social Worker, Certified Dementia Practitioner [place] [facility] [place] [facility] [phone]

## 2021-02-03 ENCOUNTER — Telehealth: Payer: Self-pay | Admitting: *Deleted

## 2021-02-03 NOTE — Chronic Care Management (AMB) (Signed)
  Care Management   Note  02/03/2021 Name: Leslie Reeves MRN: [id number] DOB: [birth year]  [name] is a 80 y.o. year old female who is a primary care patient of [name], [place] and is actively engaged with the care management team. I reached out to [name] by phone today to assist with re-scheduling an initial visit with the BSW.  Follow up plan: Unsuccessful telephone outreach attempt made. A HIPAA compliant phone message was left for the patient providing contact information and requesting a return call. The care management team will reach out to the patient again over the next 7 days. If patient returns call to provider office, please advise to call [place] at [phone].  [place] [facility]

## 2021-02-04 ENCOUNTER — Ambulatory Visit: Payer: Medicare Other | Admitting: Pulmonary Disease

## 2021-02-04 NOTE — Patient Instructions (Incomplete)
Thank you for visiting Dr. Valeta Harms at [facility]. Today we recommend the following: No orders of the defined types were placed in this encounter.  No orders of the defined types were placed in this encounter.  No follow-ups on file.    Please do your part to reduce the spread of COVID-19.

## 2021-02-04 NOTE — Progress Notes (Deleted)
Synopsis: Referred in April 2021 for lung nodule by [name], FNP  Subjective:   PATIENT ID: Leslie Reeves GENDER: female DOB: May 11, 1941, MRN: [id number]  No chief complaint on file.   This is a 80 year old female past medical history of diabetes and hypertension.  Referred for evaluation of lung nodule.  Patient underwent CT chest on 12/18/2019 which revealed a 3 mm noncalcified right middle lobe pulmonary nodule as well as aortic atherosclerosis and a thyroid goiter.  She is a former smoker, smoked for approximately 15 years only half pack per day.  Patient has no respiratory complaints today.  We reviewed her CT imaging today in the office.  She does state that she has had an asthma diagnosis for the past 20 years.  She uses as needed albuterol.  She has done well with this with no other symptoms.  She states that she has not used her albuterol this past month.  But on occasion she does have some issues and starting to become more apparent with the season change into springtime with more pollen.  OV 02/04/2021: Patient here today for follow-up after recent CT imaging of the chest for a lung nodule.  Patient has a right middle lobe lung nodule 3 x 4 mm previously was 3 mm in size. ***   Past Medical History:  Diagnosis Date  . Allergy   . Diabetes mellitus without complication ([place])   . Hypertension      Family History  Problem Relation Age of Onset  . Breast cancer Neg Hx      Past Surgical History:  Procedure Laterality Date  . ABDOMINAL HYSTERECTOMY      Social History   Socioeconomic History  . Marital status: Married    Spouse name: Not on file  . Number of children: Not on file  . Years of education: Not on file  . Highest education level: Not on file  Occupational History  . Occupation: retired  Tobacco Use  . Smoking status: Former Smoker    Packs/day: 0.50    Years: 15.00    Pack years: 7.50    Types: Cigarettes    Quit date: 2017    Years since  quitting: 5.3  . Smokeless tobacco: Never Used  Vaping Use  . Vaping Use: Never used  Substance and Sexual Activity  . Alcohol use: Not Currently  . Drug use: Not Currently  . Sexual activity: Not Currently  Other Topics Concern  . Not on file  Social History Narrative  . Not on file   Social Determinants of Health   Financial Resource Strain: Low Risk   . Difficulty of Paying Living Expenses: Not hard at all  Food Insecurity: No Food Insecurity  . Worried About Charity fundraiser in the Last Year: Never true  . Ran Out of Food in the Last Year: Never true  Transportation Needs: No Transportation Needs  . Lack of Transportation (Medical): No  . Lack of Transportation (Non-Medical): No  Physical Activity: Insufficiently Active  . Days of Exercise per Week: 2 days  . Minutes of Exercise per Session: 60 min  Stress: No Stress Concern Present  . Feeling of Stress : Not at all  Social Connections: Not on file  Intimate Partner Violence: Not on file     Allergies  Allergen Reactions  . Penicillins Rash     Outpatient Medications Prior to Visit  Medication Sig Dispense Refill  . albuterol (VENTOLIN HFA) 108 (90 Base) MCG/ACT  inhaler INHALE 2 PUFFS BY MOUTH 4 TIMES A DAY 20.1 each 1  . amLODipine (NORVASC) 10 MG tablet TAKE 1 TABLET BY MOUTH EVERY DAY 90 tablet 1  . blood glucose meter kit and supplies KIT Dispense based on patient and insurance preference. Use up to four times daily as directed. (FOR ICD-9 250.00, 250.01). 1 each 0  . diclofenac sodium (VOLTAREN) 1 % GEL Apply 2 g topically 4 (four) times daily. 100 g 1  . glucose blood (ACCU-CHEK AVIVA PLUS) test strip Use as instructed 300 each 3  . levothyroxine (SYNTHROID) 25 MCG tablet TAKE 1 & 1/2 TABLETS BY MOUTH EVERY DAY 135 tablet 0  . megestrol (MEGACE ES) 625 MG/5ML suspension Take 5 mLs (625 mg total) by mouth daily. 150 mL 1  . metFORMIN (GLUCOPHAGE) 500 MG tablet TAKE 1/2 TABLET BY MOUTH 2 TIMES DAILY 90 tablet  0  . mirtazapine (REMERON) 15 MG tablet TAKE 1 TABLET BY MOUTH EVERYDAY AT BEDTIME 90 tablet 1  . olmesartan (BENICAR) 20 MG tablet TAKE 1 TABLET BY MOUTH EVERY DAY 90 tablet 1  . rosuvastatin (CRESTOR) 20 MG tablet TAKE 1 TABLET BY MOUTH EVERY DAY 90 tablet 0  . WIXELA INHUB 100-50 MCG/DOSE AEPB TAKE 1 PUFF BY MOUTH EVERY 12 HOURS IN THE MORNING AND IN THE EVENING 180 each 1   No facility-administered medications prior to visit.    ROS   Objective:  Physical Exam   There were no vitals filed for this visit.   on RA BMI Readings from Last 3 Encounters:  01/20/21 16.42 kg/m  06/26/20 17.94 kg/m  05/05/20 19.14 kg/m   Wt Readings from Last 3 Encounters:  01/20/21 88 lb 9.6 oz (40.2 kg)  06/26/20 96 lb 3.2 oz (43.6 kg)  05/05/20 98 lb (44.5 kg)     CBC    Component Value Date/Time   WBC 5.0 01/20/2021 1134   RBC 4.67 01/20/2021 1134   HGB 11.9 01/20/2021 1134   HCT 38.5 01/20/2021 1134   PLT 328 01/20/2021 1134   MCV 82 01/20/2021 1134   MCH 25.5 (L) 01/20/2021 1134   MCHC 30.9 (L) 01/20/2021 1134   RDW 18.9 (H) 01/20/2021 1134   LYMPHSABS 2.4 01/02/2020 1134   EOSABS 0.2 01/02/2020 1134   BASOSABS 0.1 01/02/2020 1134     Chest Imaging: 12/18/2019 CT chest: 3 mm noncalcified right middle lobe pulmonary nodule.  Pulmonary Functions Testing Results: No flowsheet data found.  FeNO: None  Pathology: None   Echocardiogram: None   Heart Catheterization: None     Assessment & Plan:     ICD-10-CM   1. Solitary pulmonary nodule  R91.1   2. Mild intermittent asthma without complication  D17.61   3. Centrilobular emphysema (HCC)  J43.2     Assessment:   New diagnosis of right middle lobe lung nodule, although subcentimeter in size patient is a former smoker and does not exclude the risk of this developing into a potential malignancy.  Due to her smoking history and age as well as location of the nodule it is still considered low risk but would recommend 1  year follow-up imaging.  Plan Following Extensive Data Review & Interpretation:  . I reviewed prior external note(s) from 01/02/2020, [name], NP primary care office visit . I reviewed the result(s) of 01/02/2020 CBC unremarkable . I have ordered 27-monthfollow-up noncontrasted CT of the chest for lung nodule  Independent interpretation of tests . Review of patient's 01/02/2020 CT chest  images revealed 3 mm right middle lobe lung nodule. The patient's images have been independently reviewed by me.    We appreciate the consultation. Patient return to clinic in 12 months following CT chest imaging.    Current Outpatient Medications:  .  albuterol (VENTOLIN HFA) 108 (90 Base) MCG/ACT inhaler, INHALE 2 PUFFS BY MOUTH 4 TIMES A DAY, Disp: 20.1 each, Rfl: 1 .  amLODipine (NORVASC) 10 MG tablet, TAKE 1 TABLET BY MOUTH EVERY DAY, Disp: 90 tablet, Rfl: 1 .  blood glucose meter kit and supplies KIT, Dispense based on patient and insurance preference. Use up to four times daily as directed. (FOR ICD-9 250.00, 250.01)., Disp: 1 each, Rfl: 0 .  diclofenac sodium (VOLTAREN) 1 % GEL, Apply 2 g topically 4 (four) times daily., Disp: 100 g, Rfl: 1 .  glucose blood (ACCU-CHEK AVIVA PLUS) test strip, Use as instructed, Disp: 300 each, Rfl: 3 .  levothyroxine (SYNTHROID) 25 MCG tablet, TAKE 1 & 1/2 TABLETS BY MOUTH EVERY DAY, Disp: 135 tablet, Rfl: 0 .  megestrol (MEGACE ES) 625 MG/5ML suspension, Take 5 mLs (625 mg total) by mouth daily., Disp: 150 mL, Rfl: 1 .  metFORMIN (GLUCOPHAGE) 500 MG tablet, TAKE 1/2 TABLET BY MOUTH 2 TIMES DAILY, Disp: 90 tablet, Rfl: 0 .  mirtazapine (REMERON) 15 MG tablet, TAKE 1 TABLET BY MOUTH EVERYDAY AT BEDTIME, Disp: 90 tablet, Rfl: 1 .  olmesartan (BENICAR) 20 MG tablet, TAKE 1 TABLET BY MOUTH EVERY DAY, Disp: 90 tablet, Rfl: 1 .  rosuvastatin (CRESTOR) 20 MG tablet, TAKE 1 TABLET BY MOUTH EVERY DAY, Disp: 90 tablet, Rfl: 0 .  WIXELA INHUB 100-50 MCG/DOSE AEPB, TAKE 1 PUFF  BY MOUTH EVERY 12 HOURS IN THE MORNING AND IN THE EVENING, Disp: 180 each, Rfl: 1   Garner Nash, DO [facility] 02/04/2021 8:04 AM

## 2021-02-06 ENCOUNTER — Other Ambulatory Visit: Payer: Self-pay | Admitting: Nurse Practitioner

## 2021-02-09 ENCOUNTER — Other Ambulatory Visit: Payer: Medicare Other

## 2021-02-11 ENCOUNTER — Other Ambulatory Visit: Payer: Self-pay | Admitting: Nurse Practitioner

## 2021-02-11 NOTE — Chronic Care Management (AMB) (Signed)
  Care Management   Note  02/11/2021 Name: [name] MRN: [id number] DOB: 1941-04-29  Leslie Reeves is a 80 y.o. year old female who is a primary care patient of [name], [place] and is actively engaged with the care management team. I reached out to [name] by phone today to assist with re-scheduling an initial visit with the BSW  Follow up plan: A second unsuccessful telephone outreach attempt made. The care management team will reach out to the patient again over the next 7 days. If patient returns call to provider office, please advise to call [place] at [phone].  [place] [facility]

## 2021-02-14 ENCOUNTER — Other Ambulatory Visit: Payer: Self-pay | Admitting: *Deleted

## 2021-02-14 DIAGNOSIS — R911 Solitary pulmonary nodule: Secondary | ICD-10-CM

## 2021-02-14 NOTE — Progress Notes (Unsigned)
est

## 2021-02-16 ENCOUNTER — Other Ambulatory Visit: Payer: Self-pay | Admitting: Pulmonary Disease

## 2021-02-16 DIAGNOSIS — R911 Solitary pulmonary nodule: Secondary | ICD-10-CM

## 2021-02-16 NOTE — Chronic Care Management (AMB) (Signed)
  Care Management   Note  02/16/2021 Name: SANSA ALKEMA MRN: [id number] DOB: Apr 30, 1941  Ceasar Mons Wemhoff is a 80 y.o. year old female who is a primary care patient of [name], [place] and is actively engaged with the care management team. I reached out to [name] by phone today to assist with re-scheduling an initial visit with the BSW  Follow up plan: Telephone appointment with care management team member scheduled for:03/02/2021  [place] Management

## 2021-02-17 ENCOUNTER — Other Ambulatory Visit: Payer: Self-pay

## 2021-02-17 MED ORDER — OLMESARTAN MEDOXOMIL 20 MG PO TABS: 20.0000 mg | ORAL_TABLET | Freq: Every day | ORAL | 1 refills | Status: DC

## 2021-02-18 ENCOUNTER — Inpatient Hospital Stay: Admission: RE | Admit: 2021-02-18 | Payer: Medicare Other | Source: Ambulatory Visit

## 2021-02-18 ENCOUNTER — Ambulatory Visit
Admission: RE | Admit: 2021-02-18 | Discharge: 2021-02-18 | Disposition: A | Discharge status: Home / Self Care | Payer: Medicare Other | Source: Ambulatory Visit | Attending: Nurse Practitioner | Admitting: Nurse Practitioner

## 2021-02-18 DIAGNOSIS — R270 Ataxia, unspecified: Secondary | ICD-10-CM

## 2021-02-18 DIAGNOSIS — R413 Other amnesia: Secondary | ICD-10-CM | POA: Diagnosis not present

## 2021-02-18 DIAGNOSIS — E041 Nontoxic single thyroid nodule: Secondary | ICD-10-CM

## 2021-02-20 ENCOUNTER — Other Ambulatory Visit: Payer: Self-pay

## 2021-02-25 ENCOUNTER — Other Ambulatory Visit: Payer: Self-pay

## 2021-02-25 DIAGNOSIS — Z23 Encounter for immunization: Secondary | ICD-10-CM

## 2021-02-25 MED ORDER — PREVNAR 20 0.5 ML IM SUSY
0.5000 mL | PREFILLED_SYRINGE | INTRAMUSCULAR | 0 refills | Status: AC
Start: 2021-02-25 — End: 2021-02-25

## 2021-02-26 ENCOUNTER — Other Ambulatory Visit: Payer: Self-pay | Admitting: Nurse Practitioner

## 2021-02-26 DIAGNOSIS — R630 Anorexia: Secondary | ICD-10-CM

## 2021-03-02 ENCOUNTER — Ambulatory Visit (INDEPENDENT_AMBULATORY_CARE_PROVIDER_SITE_OTHER): Payer: Medicare Other

## 2021-03-02 ENCOUNTER — Ambulatory Visit: Payer: Self-pay

## 2021-03-02 ENCOUNTER — Telehealth: Payer: Medicare Other

## 2021-03-02 DIAGNOSIS — I10 Essential (primary) hypertension: Secondary | ICD-10-CM | POA: Diagnosis not present

## 2021-03-02 DIAGNOSIS — R634 Abnormal weight loss: Secondary | ICD-10-CM

## 2021-03-02 DIAGNOSIS — R630 Anorexia: Secondary | ICD-10-CM

## 2021-03-02 NOTE — Progress Notes (Signed)
Chronic Care Management   CCM RN Visit Note  03/02/2021 Name: Leslie Reeves MRN: [id number] DOB: 14-Jul-1941  Subjective: Leslie Reeves is a 80 y.o. year old female who is a primary care patient of [name], [place]. The care management team was consulted for assistance with disease management and care coordination needs.    Collaboration with [name] BSW  for Case Collaboration  in response to provider referral for case management and/or care coordination services.   Consent to Services:  The patient was given the following information about Chronic Care Management services today, agreed to services, and gave verbal consent: 1. CCM service includes personalized support from designated clinical staff supervised by the primary care provider, including individualized plan of care and coordination with other care providers 2. 24/7 contact phone numbers for assistance for urgent and routine care needs. 3. Service will only be billed when office clinical staff spend 20 minutes or more in a month to coordinate care. 4. Only one practitioner may furnish and bill the service in a calendar month. 5.The patient may stop CCM services at any time (effective at the end of the month) by phone call to the office staff. 6. The patient will be responsible for cost sharing (co-pay) of up to 20% of the service fee (after annual deductible is met). Patient agreed to services and consent obtained.  Patient agreed to services and verbal consent obtained.   Assessment: Review of patient past medical history, allergies, medications, health status, including review of consultants reports, laboratory and other test data, was performed as part of comprehensive evaluation and provision of chronic care management services.   SDOH (Social Determinants of Health) assessments and interventions performed:  Yes, see care plan   CCM Care Plan  Allergies  Allergen Reactions  . Penicillins Rash    Outpatient Encounter  Medications as of 03/02/2021  Medication Sig  . albuterol (VENTOLIN HFA) 108 (90 Base) MCG/ACT inhaler INHALE 2 PUFFS BY MOUTH 4 TIMES A DAY  . amLODipine (NORVASC) 10 MG tablet TAKE 1 TABLET BY MOUTH EVERY DAY  . blood glucose meter kit and supplies KIT Dispense based on patient and insurance preference. Use up to four times daily as directed. (FOR ICD-9 250.00, 250.01).  [name] diclofenac sodium (VOLTAREN) 1 % GEL Apply 2 g topically 4 (four) times daily.  [name] glucose blood (ACCU-CHEK AVIVA PLUS) test strip Use as instructed  . levothyroxine (SYNTHROID) 25 MCG tablet TAKE 1 AND 1/2 TABLETS BY MOUTH DAILY  . megestrol (MEGACE ES) 625 MG/5ML suspension Take 5 mLs (625 mg total) by mouth daily.  . metFORMIN (GLUCOPHAGE) 500 MG tablet TAKE 1/2 TABLET BY MOUTH 2 TIMES DAILY  . mirtazapine (REMERON) 15 MG tablet TAKE 1 TABLET BY MOUTH EVERYDAY AT BEDTIME  . olmesartan (BENICAR) 20 MG tablet Take 1 tablet (20 mg total) by mouth daily.  . rosuvastatin (CRESTOR) 20 MG tablet TAKE 1 TABLET BY MOUTH EVERY DAY  . WIXELA INHUB 100-50 MCG/ACT AEPB TAKE 1 PUFF BY MOUTH EVERY 12 HOURS IN THE MORNING AND IN THE EVENING   No facility-administered encounter medications on file as of 03/02/2021.    Patient Active Problem List   Diagnosis Date Noted  . Solitary pulmonary nodule 05/05/2020  . Mild intermittent asthma 05/05/2020  . Decreased appetite 02/05/2019  . Prediabetes 02/05/2019  . Essential hypertension 02/05/2019  . Right thyroid nodule 10/09/2018    Conditions to be addressed/monitored:Essential Hypertension, Decreased Appetite, Abnormal Weight Loss,   Care Plan :  Assist with Chronic Care Management and Care Coordination needs  Updates made by Charley Lafrance, [name], RN since 03/02/2021 12:00 AM    Problem: Assist with Chronic Care Management and Care Coordination needs   Priority: High    Goal: Assist with Chronic Care Management and Care Coordination needs   Start Date: 03/02/2021  Expected End Date:  04/24/2021  This Visit's Progress: On track  Priority: High  Note:   Current Barriers:   Chronic Disease Management support, education, chronic care management and care coordination needs related to Essential hypertension, Decreased appetite, Abnormal weight loss with RNCM, SW and Pharmacy Care Management and Care coordination needs Case Manager Clinical Goal(s):  [name] Patient will work with the CCM team to address needs related to chronic care management and care coordination needs related Essential hypertension, Decreased appetite, Abnormal weight loss with RNCM, SW and Pharmacy Care Management and Care Coordination needs Interventions:  . Collaborated with BSW to initiate plan of care to address needs related to chronic care management and care coordination needs related to Essential hypertension, Decreased appetite, Abnormal weight loss with RNCM, SW and Pharmacy Care Management and Care Coordination needs . Collaboration with [name] FNP regarding development and update of comprehensive plan of care as evidenced by provider attestation and co-signature  Inter-disciplinary care team collaboration (see longitudinal plan of care) Patient Goals/Self-Care Activities . patient will:   - Patient will work with the CCM team to address chronic care management and care coordination needs and will continue to work with the clinical team to address health care and disease management related needs.    Follow Up Plan: The care management team will reach out to the patient again over the next 30 days.      Plan:Telephone follow up appointment with care management team member scheduled for:  04/15/21  Barb Merino, RN, BSN, CCM Care Management Coordinator [place] [facility]  Direct Phone: [phone]

## 2021-03-02 NOTE — Patient Instructions (Signed)
Social Worker Visit Information  Goals we discussed today:  Goals Addressed            This Visit's Progress   . Barriers to Treatment Identified and Managed       Timeframe:  Short-Term Goal Priority:  High Start Date:  6.6.22                           Expected End Date: 7.21.22                       Next planned outreach: 6.27.22  Patient Goals/Self-Care Activities . patient will: with the help of her daughter  - Attend 6.13 appointment with [name], FNP    . Quality of Life Maintained       Timeframe:  Long-Range Goal Priority:  Medium Start Date:  6.6.22                           Expected End Date: 9.4.22                      Next planned outreach: 6.27.22  Patient Goals/Self-Care Activities . patient will: With the help of her daughter  - Review mailed resource information -Engage with Consulting civil engineer during scheduled appointment -Contact SW as needed prior to next scheduled call        Materials provided: Yes: verbal and written education regarding Advance Directives  Ms. Manni was given information about Chronic Care Management services today including:  1. CCM service includes personalized support from designated clinical staff supervised by her physician, including individualized plan of care and coordination with other care providers 2. 24/7 contact phone numbers for assistance for urgent and routine care needs. 3. Service will only be billed when office clinical staff spend 20 minutes or more in a month to coordinate care. 4. Only one practitioner may furnish and bill the service in a calendar month. 5. The patient may stop CCM services at any time (effective at the end of the month) by phone call to the office staff. 6. The patient will be responsible for cost sharing (co-pay) of up to 20% of the service fee (after annual deductible is met).  Patient agreed to services and verbal consent obtained.   The patient verbalized understanding of  instructions, educational materials, and care plan provided today and declined offer to receive copy of patient instructions, educational materials, and care plan.   Follow up plan: SW will follow up with patient by phone over the next month   [name], BSW, CDP Social Worker, Certified Dementia Practitioner [place] [facility] [facility] [phone]

## 2021-03-02 NOTE — Chronic Care Management (AMB) (Signed)
Chronic Care Management    Social Work Note  03/02/2021 Name: Leslie Reeves MRN: [id number] DOB: March 24, 1941  Leslie Reeves is a 80 y.o. year old female who is a primary care patient of [name], [place]. The CCM team was consulted to assist the patient with chronic disease management and/or care coordination needs related to: [employer] .   Egaged with patients daughter Leslie Reeves by phone for initial visit in response to provider referral for social work chronic care management and care coordination services.   Consent to Services:  The patient was given the following information about Chronic Care Management services today, agreed to services, and gave verbal consent: 1. CCM service includes personalized support from designated clinical staff supervised by the primary care provider, including individualized plan of care and coordination with other care providers 2. 24/7 contact phone numbers for assistance for urgent and routine care needs. 3. Service will only be billed when office clinical staff spend 20 minutes or more in a month to coordinate care. 4. Only one practitioner may furnish and bill the service in a calendar month. 5.The patient may stop CCM services at any time (effective at the end of the month) by phone call to the office staff. 6. The patient will be responsible for cost sharing (co-pay) of up to 20% of the service fee (after annual deductible is met). Patient agreed to services and consent obtained.  Patient agreed to services and consent obtained.   Assessment: Review of patient past medical history, allergies, medications, and health status, including review of relevant consultants reports was performed today as part of a comprehensive evaluation and provision of chronic care management and care coordination services.     SDOH (Social Determinants of Health) assessments and interventions performed:  SDOH Interventions   Flowsheet Row Most Recent Value  SDOH  Interventions   Food Insecurity Interventions Intervention Not Indicated  Housing Interventions Intervention Not Indicated  Transportation Interventions Intervention Not Indicated       Advanced Directives Status: See Care Plan for related entries.  CCM Care Plan  Allergies  Allergen Reactions  . Penicillins Rash    Outpatient Encounter Medications as of 03/02/2021  Medication Sig  . albuterol (VENTOLIN HFA) 108 (90 Base) MCG/ACT inhaler INHALE 2 PUFFS BY MOUTH 4 TIMES A DAY  . amLODipine (NORVASC) 10 MG tablet TAKE 1 TABLET BY MOUTH EVERY DAY  . blood glucose meter kit and supplies KIT Dispense based on patient and insurance preference. Use up to four times daily as directed. (FOR ICD-9 250.00, 250.01).  [name] diclofenac sodium (VOLTAREN) 1 % GEL Apply 2 g topically 4 (four) times daily.  [name] glucose blood (ACCU-CHEK AVIVA PLUS) test strip Use as instructed  . levothyroxine (SYNTHROID) 25 MCG tablet TAKE 1 AND 1/2 TABLETS BY MOUTH DAILY  . megestrol (MEGACE ES) 625 MG/5ML suspension Take 5 mLs (625 mg total) by mouth daily.  . metFORMIN (GLUCOPHAGE) 500 MG tablet TAKE 1/2 TABLET BY MOUTH 2 TIMES DAILY  . mirtazapine (REMERON) 15 MG tablet TAKE 1 TABLET BY MOUTH EVERYDAY AT BEDTIME  . olmesartan (BENICAR) 20 MG tablet Take 1 tablet (20 mg total) by mouth daily.  . rosuvastatin (CRESTOR) 20 MG tablet TAKE 1 TABLET BY MOUTH EVERY DAY  . WIXELA INHUB 100-50 MCG/ACT AEPB TAKE 1 PUFF BY MOUTH EVERY 12 HOURS IN THE MORNING AND IN THE EVENING   No facility-administered encounter medications on file as of 03/02/2021.    Patient Active Problem List  Diagnosis Date Noted  . Solitary pulmonary nodule 05/05/2020  . Mild intermittent asthma 05/05/2020  . Decreased appetite 02/05/2019  . Prediabetes 02/05/2019  . Essential hypertension 02/05/2019  . Right thyroid nodule 10/09/2018    Conditions to be addressed/monitored: HTN, Decreased Appetite, and Abnormal Weight Loss; Limited knowledge of  health plan benefits and Advance Directive Education  Care Plan : Social Work [place]  Updates made by [name] since 03/02/2021 12:00 AM    Problem: Barriers to Treatment     Goal: Barriers to Treatment Identified and Managed   Start Date: 03/02/2021  Expected End Date: 04/16/2021  Priority: High  Note:   Current Barriers:  . Chronic disease management support and education needs related to  HTN, Decreased Appetite, and Abnormal Weight loss   . Patient and daughter unsure how to obtain Oxygen . Decline in memory leading to gaps in care due to patient forgetfulness  Social Worker Clinical Goal(s):  . patient will work with SW to identify and address any acute and/or chronic care coordination needs related to the self health management of  HTN, Decreased Appetite, and Abnormal Weight loss    . Patient will follow up with primary provider regarding need for B12 injections and oxygen as directed by SW  SW Interventions:  . Inter-disciplinary care team collaboration (see longitudinal plan of care) . Collaboration with [name], FNP regarding development and update of comprehensive plan of care as evidenced by provider attestation and co-signature . Successful outbound call placed to the patient to assess for care coordination needs- spoke with patients daughter Leslie Reeves . Discussed Leslie Reeves received a letter indicating the patient is in need of B12 injections but has yet to contact the office to arrange an appointment . Performed chart review to note the patient has an upcoming appointment scheduled Monday 6.13.22 with her primary care provider . Leslie Reeves indicates she was not aware of this appointment but did write down the appointment details and will transport the patient  . Discussed the patients memory has been declining which has led to gaps in care including missed appointments because Leslie Reeves is not aware of them . Performed chart review to note last office  visit note indicated the patient would need Oxygen and should follow up with pulmonologist  . Determined the patient has yet to receive oxygen and is not established with a pulmonologist nor does she have a future appointment that Leslie Reeves is aware of . Advised Leslie Reeves to speak with the patients primary provider on Monday 6.13.22 regarding oxygen needs and B12 injections - Leslie Reeves stated understanding . Collaboration with [name] FNP to communicate follow up items Leslie Reeves plans to discuss . Discussed plan to follow up with SW for care coordination while patient engaged with  RN Case Manager  to address care management needs . Scheduled initial RN Care Manager appointment for 7.20.22 . Scheduled follow up call with SW for 6.27.22 . Collaboration with [place] to discuss interventions and plan  Patient Goals/Self-Care Activities . patient will: with the help of her daughter  -  Attend 6.13 appointment with [name], FNP  Follow Up Plan: Telephone follow up appointment with care management team member scheduled for:6.27.22    Problem: Quality of Life (General Plan of Care)     Long-Range Goal: Quality of Life Maintained   Start Date: 03/02/2021  Expected End Date: 05/31/2021  Priority: Medium  Note:   Current Barriers:  .  Chronic disease management support and education needs related to  HTN, Decreased Appetite, and Abnormal Weight loss   . Limited knowledge of health plan benefit . Limited knowledge of the importance of an Forensic scientist  Social Worker Clinical Goal(s):  . patient will work with SW to identify and address any acute and/or chronic care coordination needs related to the self health management of  HTN, Decreased Appetite, and Abnormal Weight Loss . Patient will work with RN Care Manager to develop an individualized plan of care . Patient will work with SW to learn more about Regulatory affairs officer . Patient will work with SW to become  more knowledgeable of health plan benefits   SW Interventions:  . Inter-disciplinary care team collaboration (see longitudinal plan of care) . Collaboration with [name], FNP regarding development and update of comprehensive plan of care as evidenced by provider attestation and co-signature . Successful outbound call placed to the patient to conduct an SDoH screen, patient was unavailable at the time of today's call. SW spoke with patients daughter whom the patient lives with - no acute SDoH challenges noted at this time . Discussed the patient has been losing weight over the past year - patient occasionally drinks Ensure but does not use this supplement on a regular basis . Determined the patient is independent with bathing, dressing, and feeding. Over the past several months she has had an increase in forgetfulness and loss of appetite . Leslie Reeves reports the patient has had a head CT which showed evidence of a past stroke at least 28 months old . Discussed the patient lives with Leslie Reeves who is able to provide transportation to all patient appointments. The patient has missed past office visits due to Leslie Reeves not being aware of scheduled appointments . Determined the patient does not have an advance directive - Leslie Reeves is interested in learning more . Advised Leslie Reeves SW could mail her the information and provide advance directive education - however there may be question if the patient is cognitively able to complete an advance directive at this time. - Leslie Reeves stated understanding . Provided an Advance Directive packet via mail . Performed chart review to note patient is covered by both Medicare and Medicaid . Leslie Reeves is aware of the over the counter benefit but is not aware of the patients healthy food benefit . Provided verbal education of how to access the health food benefit including what may be purchased as well as which stores participate in this program . Discussed  plans for patient to work with SW to address resource needs while patient engages with RN Care Manager to address care management needs . Scheduled initial RN Care Management visit for 7.20.22 . Scheduled follow up call with SW on 6.27.22 . Collaboration with RN Care Manager to discuss patient enrollment and plan to address care coordination needs  Patient Goals/Self-Care Activities . patient will: With the help of her daughter  -  Review mailed resource information -Engage with Consulting civil engineer during scheduled appointment -Contact SW as needed prior to next scheduled call  Follow Up Plan: Telephone follow up appointment with care management team member scheduled for: 6.27.22       Follow Up Plan: SW will follow up with patient by phone over the next month      [name], BSW, CDP Social Worker, Certified Dementia Practitioner [place] [facility] [place] [facility] [phone]  Total time spent performing care coordination and/or care management activities with  the patient by phone or face to face = 85 minutes.

## 2021-03-04 ENCOUNTER — Other Ambulatory Visit: Payer: Self-pay

## 2021-03-04 MED ORDER — ACCU-CHEK SOFTCLIX LANCETS MISC: 12 refills | Status: AC

## 2021-03-08 ENCOUNTER — Other Ambulatory Visit: Payer: Self-pay | Admitting: Nurse Practitioner

## 2021-03-08 DIAGNOSIS — R413 Other amnesia: Secondary | ICD-10-CM

## 2021-03-08 DIAGNOSIS — R270 Ataxia, unspecified: Secondary | ICD-10-CM

## 2021-03-08 NOTE — Progress Notes (Signed)
Referrals ordered for Neurology and PT

## 2021-03-09 ENCOUNTER — Other Ambulatory Visit: Payer: Self-pay

## 2021-03-09 ENCOUNTER — Encounter: Payer: Self-pay | Admitting: Nurse Practitioner

## 2021-03-09 ENCOUNTER — Ambulatory Visit (INDEPENDENT_AMBULATORY_CARE_PROVIDER_SITE_OTHER): Payer: Medicare Other | Admitting: Nurse Practitioner

## 2021-03-09 VITALS — BP 148/82 | HR 79 | Temp 98.2°F | Ht 61.0 in | Wt 87.6 lb

## 2021-03-09 DIAGNOSIS — I10 Essential (primary) hypertension: Secondary | ICD-10-CM | POA: Diagnosis not present

## 2021-03-09 DIAGNOSIS — R413 Other amnesia: Secondary | ICD-10-CM | POA: Diagnosis not present

## 2021-03-09 DIAGNOSIS — E46 Unspecified protein-calorie malnutrition: Secondary | ICD-10-CM | POA: Diagnosis not present

## 2021-03-09 DIAGNOSIS — R911 Solitary pulmonary nodule: Secondary | ICD-10-CM

## 2021-03-09 DIAGNOSIS — E538 Deficiency of other specified B group vitamins: Secondary | ICD-10-CM

## 2021-03-09 DIAGNOSIS — Z8673 Personal history of transient ischemic attack (TIA), and cerebral infarction without residual deficits: Secondary | ICD-10-CM

## 2021-03-09 DIAGNOSIS — J439 Emphysema, unspecified: Secondary | ICD-10-CM

## 2021-03-09 DIAGNOSIS — IMO0001 Reserved for inherently not codable concepts without codable children: Secondary | ICD-10-CM

## 2021-03-09 MED ORDER — CYANOCOBALAMIN 1000 MCG/ML IJ SOLN
1000.0000 ug | Freq: Once | INTRAMUSCULAR | Status: AC
  Administered 2021-03-09: 1000 ug via INTRAMUSCULAR

## 2021-03-09 MED ORDER — ASPIRIN 325 MG PO TABS: 325.0000 mg | ORAL_TABLET | Freq: Every day | ORAL | 3 refills | Status: DC

## 2021-03-09 NOTE — Patient Instructions (Addendum)
Hypertension, Adult High blood pressure (hypertension) is when the force of blood pumping through the arteries is too strong. The arteries are the blood vessels that carry blood from the heart throughout the body. Hypertension forces the heart to work harder to pump blood and may cause arteries to become narrow or stiff. Untreated or uncontrolled hypertension can cause a heart attack, heart failure, a stroke, kidney disease, and otherproblems. A blood pressure reading consists of a higher number over a lower number. Ideally, your blood pressure should be below 120/80. The first ("top") number is called the systolic pressure. It is a measure of the pressure in your arteries as your heart beats. The second ("bottom") number is called the diastolic pressure. It is a measure of the pressure in your arteries as theheart relaxes. What are the causes? The exact cause of this condition is not known. There are some conditions thatresult in or are related to high blood pressure. What increases the risk? Some risk factors for high blood pressure are under your control. The following factors may make you more likely to develop this condition: Smoking. Having type 2 diabetes mellitus, high cholesterol, or both. Not getting enough exercise or physical activity. Being overweight. Having too much fat, sugar, calories, or salt (sodium) in your diet. Drinking too much alcohol. Some risk factors for high blood pressure may be difficult or impossible to change. Some of these factors include: Having chronic kidney disease. Having a family history of high blood pressure. Age. Risk increases with age. Race. You may be at higher risk if you are African American. Gender. Men are at higher risk than women before age 71. After age 10, women are at higher risk than men. Having obstructive sleep apnea. Stress. What are the signs or symptoms? High blood pressure may not cause symptoms. Very high blood pressure  (hypertensive crisis) may cause: Headache. Anxiety. Shortness of breath. Nosebleed. Nausea and vomiting. Vision changes. Severe chest pain. Seizures. How is this diagnosed? This condition is diagnosed by measuring your blood pressure while you are seated, with your arm resting on a flat surface, your legs uncrossed, and your feet flat on the floor. The cuff of the blood pressure monitor will be placed directly against the skin of your upper arm at the level of your heart. It should be measured at least twice using the same arm. Certain conditions cancause a difference in blood pressure between your right and left arms. Certain factors can cause blood pressure readings to be lower or higher than normal for a short period of time: When your blood pressure is higher when you are in a health care provider's office than when you are at home, this is called white coat hypertension. Most people with this condition do not need medicines. When your blood pressure is higher at home than when you are in a health care provider's office, this is called masked hypertension. Most people with this condition may need medicines to control blood pressure. If you have a high blood pressure reading during one visit or you have normal blood pressure with other risk factors, you may be asked to: Return on a different day to have your blood pressure checked again. Monitor your blood pressure at home for 1 week or longer. If you are diagnosed with hypertension, you may have other blood or imaging tests to help your health care provider understand your overall risk for otherconditions. How is this treated? This condition is treated by making healthy lifestyle changes,  such as eating healthy foods, exercising more, and reducing your alcohol intake. Your health care provider may prescribe medicine if lifestyle changes are not enough to get your blood pressure under control, and if: Your systolic blood pressure is above  130. Your diastolic blood pressure is above 80. Your personal target blood pressure may vary depending on your medicalconditions, your age, and other factors. Follow these instructions at home: Eating and drinking  Eat a diet that is high in fiber and potassium, and low in sodium, added sugar, and fat. An example eating plan is called the DASH (Dietary Approaches to Stop Hypertension) diet. To eat this way: Eat plenty of fresh fruits and vegetables. Try to fill one half of your plate at each meal with fruits and vegetables. Eat whole grains, such as whole-wheat pasta, brown rice, or whole-grain bread. Fill about one fourth of your plate with whole grains. Eat or drink low-fat dairy products, such as skim milk or low-fat yogurt. Avoid fatty cuts of meat, processed or cured meats, and poultry with skin. Fill about one fourth of your plate with lean proteins, such as fish, chicken without skin, beans, eggs, or tofu. Avoid pre-made and processed foods. These tend to be higher in sodium, added sugar, and fat. Reduce your daily sodium intake. Most people with hypertension should eat less than 1,500 mg of sodium a day. Do not drink alcohol if: Your health care provider tells you not to drink. You are pregnant, may be pregnant, or are planning to become pregnant. If you drink alcohol: Limit how much you use to: 0-1 drink a day for women. 0-2 drinks a day for men. Be aware of how much alcohol is in your drink. In the [place] one drink equals one 12 oz bottle of beer (355 mL), one 5 oz glass of wine (148 mL), or one 1 oz glass of hard liquor (44 mL).  Lifestyle  Work with your health care provider to maintain a healthy body weight or to lose weight. Ask what an ideal weight is for you. Get at least 30 minutes of exercise most days of the week. Activities may include walking, swimming, or biking. Include exercise to strengthen your muscles (resistance exercise), such as Pilates or lifting weights, as  part of your weekly exercise routine. Try to do these types of exercises for 30 minutes at least 3 days a week. Do not use any products that contain nicotine or tobacco, such as cigarettes, e-cigarettes, and chewing tobacco. If you need help quitting, ask your health care provider. Monitor your blood pressure at home as told by your health care provider. Keep all follow-up visits as told by your health care provider. This is important.  Medicines Take over-the-counter and prescription medicines only as told by your health care provider. Follow directions carefully. Blood pressure medicines must be taken as prescribed. Do not skip doses of blood pressure medicine. Doing this puts you at risk for problems and can make the medicine less effective. Ask your health care provider about side effects or reactions to medicines that you should watch for. Contact a health care provider if you: Think you are having a reaction to a medicine you are taking. Have headaches that keep coming back (recurring). Feel dizzy. Have swelling in your ankles. Have trouble with your vision. Get help right away if you: Develop a severe headache or confusion. Have unusual weakness or numbness. Feel faint. Have severe pain in your chest or abdomen. Vomit repeatedly. Have trouble breathing. Summary  Hypertension is when the force of blood pumping through your arteries is too strong. If this condition is not controlled, it may put you at risk for serious complications. Your personal target blood pressure may vary depending on your medical conditions, your age, and other factors. For most people, a normal blood pressure is less than 120/80. Hypertension is treated with lifestyle changes, medicines, or a combination of both. Lifestyle changes include losing weight, eating a healthy, low-sodium diet, exercising more, and limiting alcohol. This information is not intended to replace advice given to you by your health care  provider. Make sure you discuss any questions you have with your healthcare provider. Document Revised: 05/24/2018 Document Reviewed: 05/24/2018 Elsevier Patient Education  2022 [place].     Dr. June Leap number - lung doctor -   [place] [address] [phone]  Glucerna protein shake

## 2021-03-09 NOTE — Progress Notes (Signed)
I,Leslie Reeves,acting as a Education administrator for [employer], FNP.,have documented all relevant documentation on the behalf of Leslie Brine, FNP,as directed by  Leslie Brine, FNP while in the presence of Leslie Reeves, Leslie Reeves.  This visit occurred during the SARS-CoV-2 public health emergency.  Safety protocols were in place, including screening questions prior to the visit, additional usage of staff PPE, and extensive cleaning of exam room while observing appropriate contact time as indicated for disinfecting solutions.  Subjective:     Patient ID: Leslie Reeves , female    DOB: Jul 06, 1941 , 80 y.o.   MRN: [id number]   Chief Complaint  Patient presents with   Hypertension    HPI  Patient presents today for a f/u on her prediabetes and blood pressure.   Hypertension Pertinent negatives include no anxiety, chest pain, headaches, orthopnea (will sleep with pillows propped up ), palpitations or peripheral edema.    Past Medical History:  Diagnosis Date   Allergy    Diabetes mellitus without complication (HCC)    Hypertension      Family History  Problem Relation Age of Onset   Breast cancer Neg Hx      Current Outpatient Medications:    Accu-Chek Softclix Lancets lancets, Use as instructed, Disp: 100 each, Rfl: 12   amLODipine (NORVASC) 10 MG tablet, TAKE 1 TABLET BY MOUTH EVERY DAY, Disp: 90 tablet, Rfl: 1   aspirin (BAYER ASPIRIN) 325 MG tablet, Take 1 tablet (325 mg total) by mouth daily., Disp: 100 tablet, Rfl: 3   blood glucose meter kit and supplies KIT, Dispense based on patient and insurance preference. Use up to four times daily as directed. (FOR ICD-9 250.00, 250.01)., Disp: 1 each, Rfl: 0   diclofenac sodium (VOLTAREN) 1 % GEL, Apply 2 g topically 4 (four) times daily., Disp: 100 g, Rfl: 1   glucose blood (ACCU-CHEK AVIVA PLUS) test strip, Use as instructed, Disp: 300 each, Rfl: 3   levothyroxine (SYNTHROID) 25 MCG tablet, TAKE 1 AND 1/2 TABLETS BY MOUTH DAILY, Disp: 135 tablet,  Rfl: 0   megestrol (MEGACE ES) 625 MG/5ML suspension, Take 5 mLs (625 mg total) by mouth daily., Disp: 150 mL, Rfl: 1   metFORMIN (GLUCOPHAGE) 500 MG tablet, TAKE 1/2 TABLET BY MOUTH 2 TIMES DAILY, Disp: 90 tablet, Rfl: 0   mirtazapine (REMERON) 15 MG tablet, TAKE 1 TABLET BY MOUTH EVERYDAY AT BEDTIME, Disp: 90 tablet, Rfl: 1   olmesartan (BENICAR) 20 MG tablet, Take 1 tablet (20 mg total) by mouth daily., Disp: 90 tablet, Rfl: 1   rosuvastatin (CRESTOR) 20 MG tablet, TAKE 1 TABLET BY MOUTH EVERY DAY, Disp: 90 tablet, Rfl: 0   albuterol (VENTOLIN HFA) 108 (90 Base) MCG/ACT inhaler, INHALE 2 PUFFS BY MOUTH 4 TIMES A DAY, Disp: 20.1 each, Rfl: 3   fluticasone-salmeterol (WIXELA INHUB) 100-50 MCG/ACT AEPB, TAKE 1 PUFF BY MOUTH EVERY 12 HOURS IN THE MORNING AND IN THE EVENING, Disp: 180 each, Rfl: 11  Current Facility-Administered Medications:    cyanocobalamin ((VITAMIN B-12)) injection 1,000 mcg, 1,000 mcg, Intramuscular, Once, Leslie Brine, FNP   Allergies  Allergen Reactions   Penicillins Rash     Review of Systems  Constitutional: Negative.   Respiratory: Negative.    Cardiovascular: Negative.  Negative for chest pain, palpitations and orthopnea (will sleep with pillows propped up ).  Gastrointestinal: Negative.   Neurological: Negative.  Negative for headaches.    Today's Vitals   03/09/21 1148  BP: (!) 148/82  Pulse: 79  Temp:  98.2 F (36.8 C)  TempSrc: Oral  Weight: 87 lb 9.6 oz (39.7 kg)  Height: _0  (1.549 m)   Body mass index is 16.55 kg/m.   Objective:  Physical Exam Vitals reviewed.  Constitutional:      General: She is not in acute distress.    Appearance: Normal appearance.  Cardiovascular:     Rate and Rhythm: Normal rate and regular rhythm.     Pulses: Normal pulses.     Heart sounds: Normal heart sounds. No murmur heard. Pulmonary:     Effort: Pulmonary effort is normal. No respiratory distress.     Breath sounds: Normal breath sounds.  Skin:     Capillary Refill: Capillary refill takes less than 2 seconds.  Neurological:     General: No focal deficit present.     Mental Status: She is alert and oriented to person, place, and time.     Cranial Nerves: No cranial nerve deficit.  Psychiatric:        Mood and Affect: Mood normal.        Behavior: Behavior normal.        Thought Content: Thought content normal.        Judgment: Judgment normal.        Assessment And Plan:     1. Essential hypertension Chronic, fair control Continue with current medications  2. Malnutrition, unspecified type ([place]) Will refer to nutritionist to help with gaining weight  - Amb ref to Medical Nutrition Therapy-MNT  3. Lung nodule < 6cm on CT She is to continue follow up with Pulmonology  4. Memory change Some of her memory challenges may be related to a low vitamin B12 she will have an injection today 1 of 4 weekly  - cyanocobalamin ((VITAMIN B-12)) injection 1,000 mcg  5. Pulmonary emphysema, unspecified emphysema type ([place]) Continue follow up with Pulmonologist and to use her inhalers  6. Vitamin B12 deficiency - cyanocobalamin ((VITAMIN B-12)) injection 1,000 mcg  7. History of CVA (cerebrovascular accident) She is encouraged to take an aspirin daily. She had an old CVA noted on her CT scan , undetermined on when this occurred Referral was placed to Neurology - aspirin (BAYER ASPIRIN) 325 MG tablet; Take 1 tablet (325 mg total) by mouth daily.  Dispense: 100 tablet; Refill: 3     Patient was given opportunity to ask questions. Patient verbalized understanding of the plan and was able to repeat key elements of the plan. All questions were answered to their satisfaction.  Leslie Brine, FNP   I, Leslie Brine, FNP, have reviewed all documentation for this visit. The documentation on 03/22/21 for the exam, diagnosis, procedures, and orders are all accurate and complete.   IF YOU HAVE BEEN REFERRED TO A SPECIALIST, IT MAY TAKE 1-2 WEEKS  TO SCHEDULE/PROCESS THE REFERRAL. IF YOU HAVE NOT HEARD FROM US/SPECIALIST IN TWO WEEKS, PLEASE GIVE [place] A CALL AT [phone] X [phone].   THE PATIENT IS ENCOURAGED TO PRACTICE SOCIAL DISTANCING DUE TO THE COVID-19 PANDEMIC.

## 2021-03-12 ENCOUNTER — Ambulatory Visit (INDEPENDENT_AMBULATORY_CARE_PROVIDER_SITE_OTHER): Payer: Medicare Other | Admitting: Pulmonary Disease

## 2021-03-12 ENCOUNTER — Other Ambulatory Visit: Payer: Self-pay

## 2021-03-12 ENCOUNTER — Encounter: Payer: Self-pay | Admitting: Pulmonary Disease

## 2021-03-12 VITALS — BP 140/70 | HR 69 | Temp 97.7°F | Ht 61.0 in | Wt 88.2 lb

## 2021-03-12 DIAGNOSIS — R0902 Hypoxemia: Secondary | ICD-10-CM

## 2021-03-12 DIAGNOSIS — Z87891 Personal history of nicotine dependence: Secondary | ICD-10-CM

## 2021-03-12 DIAGNOSIS — R911 Solitary pulmonary nodule: Secondary | ICD-10-CM

## 2021-03-12 DIAGNOSIS — J432 Centrilobular emphysema: Secondary | ICD-10-CM | POA: Diagnosis not present

## 2021-03-12 DIAGNOSIS — J4520 Mild intermittent asthma, uncomplicated: Secondary | ICD-10-CM

## 2021-03-12 DIAGNOSIS — J449 Chronic obstructive pulmonary disease, unspecified: Secondary | ICD-10-CM

## 2021-03-12 DIAGNOSIS — R64 Cachexia: Secondary | ICD-10-CM | POA: Diagnosis not present

## 2021-03-12 MED ORDER — ALBUTEROL SULFATE HFA 108 (90 BASE) MCG/ACT IN AERS: INHALATION_SPRAY | RESPIRATORY_TRACT | 3 refills | Status: DC

## 2021-03-12 MED ORDER — FLUTICASONE-SALMETEROL 100-50 MCG/ACT IN AEPB: INHALATION_SPRAY | RESPIRATORY_TRACT | 11 refills | Status: DC

## 2021-03-12 NOTE — Progress Notes (Signed)
Synopsis: Referred in April 2021 for lung nodule by [name], FNP  Subjective:   PATIENT ID: Leslie Reeves GENDER: female DOB: [birth year], MRN: [id number]  Chief Complaint  Patient presents with   Follow-up    This is a 80 year old female past medical history of diabetes and hypertension.  Referred for evaluation of lung nodule.  Patient underwent CT chest on 12/18/2019 which revealed a 3 mm noncalcified right middle lobe pulmonary nodule as well as aortic atherosclerosis and a thyroid goiter.  She is a former smoker, smoked for approximately 15 years only half pack per day.  Patient has no respiratory complaints today.  We reviewed her CT imaging today in the office.  She does state that she has had an asthma diagnosis for the past 20 years.  She uses as needed albuterol.  She has done well with this with no other symptoms.  She states that she has not used her albuterol this past month.  But on occasion she does have some issues and starting to become more apparent with the season change into springtime with more pollen..  OV 03/12/2021: shes on megace to help stimulate her appetitie. Currently using wixela. Breathing ok.  Feels short of breath with exertion.  Biggest issue at this moment seems to be ongoing weight loss.  She has an appointment with nutritionist soon.  CT scan of the chest was completed in April of this past year that had follow-up regarding nodule.  This is stable.   Past Medical History:  Diagnosis Date   Allergy    Diabetes mellitus without complication (HCC)    Hypertension      Family History  Problem Relation Age of Onset   Breast cancer Neg Hx      Past Surgical History:  Procedure Laterality Date   ABDOMINAL HYSTERECTOMY      Social History   Socioeconomic History   Marital status: Married    Spouse name: Not on file   Number of children: Not on file   Years of education: Not on file   Highest education level: Not on file  Occupational History    Occupation: retired  Tobacco Use   Smoking status: Former    Packs/day: 0.50    Years: 15.00    Pack years: 7.50    Types: Cigarettes    Quit date: 2017    Years since quitting: 5.4   Smokeless tobacco: Never  Vaping Use   Vaping Use: Never used  Substance and Sexual Activity   Alcohol use: Not Currently   Drug use: Not Currently   Sexual activity: Not Currently  Other Topics Concern   Not on file  Social History Narrative   Not on file   Social Determinants of Health   Financial Resource Strain: Low Risk    Difficulty of Paying Living Expenses: Not hard at all  Food Insecurity: No Food Insecurity   Worried About Charity fundraiser in the Last Year: Never true   [place] in the Last Year: Never true  Transportation Needs: No Transportation Needs   Lack of Transportation (Medical): No   Lack of Transportation (Non-Medical): No  Physical Activity: Insufficiently Active   Days of Exercise per Week: 2 days   Minutes of Exercise per Session: 60 min  Stress: No Stress Concern Present   Feeling of Stress : Not at all  Social Connections: Not on file  Intimate Partner Violence: Not on file     Allergies  Allergen Reactions   Penicillins Rash     Outpatient Medications Prior to Visit  Medication Sig Dispense Refill   Accu-Chek Softclix Lancets lancets Use as instructed 100 each 12   amLODipine (NORVASC) 10 MG tablet TAKE 1 TABLET BY MOUTH EVERY DAY 90 tablet 1   aspirin (BAYER ASPIRIN) 325 MG tablet Take 1 tablet (325 mg total) by mouth daily. 100 tablet 3   blood glucose meter kit and supplies KIT Dispense based on patient and insurance preference. Use up to four times daily as directed. (FOR ICD-9 250.00, 250.01). 1 each 0   diclofenac sodium (VOLTAREN) 1 % GEL Apply 2 g topically 4 (four) times daily. 100 g 1   glucose blood (ACCU-CHEK AVIVA PLUS) test strip Use as instructed 300 each 3   levothyroxine (SYNTHROID) 25 MCG tablet TAKE 1 AND 1/2 TABLETS BY MOUTH  DAILY 135 tablet 0   megestrol (MEGACE ES) 625 MG/5ML suspension Take 5 mLs (625 mg total) by mouth daily. 150 mL 1   metFORMIN (GLUCOPHAGE) 500 MG tablet TAKE 1/2 TABLET BY MOUTH 2 TIMES DAILY 90 tablet 0   mirtazapine (REMERON) 15 MG tablet TAKE 1 TABLET BY MOUTH EVERYDAY AT BEDTIME 90 tablet 1   olmesartan (BENICAR) 20 MG tablet Take 1 tablet (20 mg total) by mouth daily. 90 tablet 1   rosuvastatin (CRESTOR) 20 MG tablet TAKE 1 TABLET BY MOUTH EVERY DAY 90 tablet 0   albuterol (VENTOLIN HFA) 108 (90 Base) MCG/ACT inhaler INHALE 2 PUFFS BY MOUTH 4 TIMES A DAY 20.1 each 1   WIXELA INHUB 100-50 MCG/ACT AEPB TAKE 1 PUFF BY MOUTH EVERY 12 HOURS IN THE MORNING AND IN THE EVENING 180 each 1   No facility-administered medications prior to visit.    Review of Systems  Constitutional:  Positive for weight loss. Negative for chills, fever and malaise/fatigue.  HENT:  Negative for hearing loss, sore throat and tinnitus.   Eyes:  Negative for blurred vision and double vision.  Respiratory:  Positive for shortness of breath. Negative for cough, hemoptysis, sputum production, wheezing and stridor.   Cardiovascular:  Negative for chest pain, palpitations, orthopnea, leg swelling and PND.  Gastrointestinal:  Negative for abdominal pain, constipation, diarrhea, heartburn, nausea and vomiting.  Genitourinary:  Negative for dysuria, hematuria and urgency.  Musculoskeletal:  Negative for joint pain and myalgias.  Skin:  Negative for itching and rash.  Neurological:  Negative for dizziness, tingling, weakness and headaches.  Endo/Heme/Allergies:  Negative for environmental allergies. Does not bruise/bleed easily.  Psychiatric/Behavioral:  Negative for depression. The patient is not nervous/anxious and does not have insomnia.   All other systems reviewed and are negative.   Objective:  Physical Exam Vitals reviewed.  Constitutional:      General: She is not in acute distress.    Appearance: She is  well-developed.  HENT:     Head: Normocephalic and atraumatic.     Mouth/Throat:     Pharynx: No oropharyngeal exudate.  Eyes:     Conjunctiva/sclera: Conjunctivae normal.     Pupils: Pupils are equal, round, and reactive to light.  Neck:     Vascular: No JVD.     Trachea: No tracheal deviation.     Comments: Loss of supraclavicular fat Cardiovascular:     Rate and Rhythm: Normal rate and regular rhythm.     Heart sounds: S1 normal and S2 normal.     Comments: Distant heart tones Pulmonary:     Effort: No tachypnea or accessory  muscle usage.     Breath sounds: No stridor. Decreased breath sounds (throughout all lung fields) present. No wheezing, rhonchi or rales.  Abdominal:     General: Bowel sounds are normal. There is no distension.     Palpations: Abdomen is soft.     Tenderness: There is no abdominal tenderness.  Musculoskeletal:        General: Deformity (muscle wasting ) present.  Skin:    General: Skin is warm and dry.     Capillary Refill: Capillary refill takes less than 2 seconds.     Findings: No rash.  Neurological:     Mental Status: She is alert and oriented to person, place, and time.  Psychiatric:        Behavior: Behavior normal.     Vitals:   03/12/21 1516  BP: 140/70  Pulse: 69  Temp: 97.7 F (36.5 C)  TempSrc: Temporal  SpO2: (!) 89%  Weight: 88 lb 4 oz (40 kg)  Height: _0  (1.549 m)   (!) 89% on RA BMI Readings from Last 3 Encounters:  03/12/21 16.67 kg/m  03/09/21 16.55 kg/m  01/20/21 16.42 kg/m   Wt Readings from Last 3 Encounters:  03/12/21 88 lb 4 oz (40 kg)  03/09/21 87 lb 9.6 oz (39.7 kg)  01/20/21 88 lb 9.6 oz (40.2 kg)     CBC    Component Value Date/Time   WBC 5.0 01/20/2021 1134   RBC 4.67 01/20/2021 1134   HGB 11.9 01/20/2021 1134   HCT 38.5 01/20/2021 1134   PLT 328 01/20/2021 1134   MCV 82 01/20/2021 1134   MCH 25.5 (L) 01/20/2021 1134   MCHC 30.9 (L) 01/20/2021 1134   RDW 18.9 (H) 01/20/2021 1134    LYMPHSABS 2.4 01/02/2020 1134   EOSABS 0.2 01/02/2020 1134   BASOSABS 0.1 01/02/2020 1134     Chest Imaging: 12/18/2019 CT chest: 3 mm noncalcified right middle lobe pulmonary nodule.  Pulmonary Functions Testing Results: No flowsheet data found.  FeNO: None  Pathology: None   Echocardiogram: None   Heart Catheterization: None     Assessment & Plan:     ICD-10-CM   1. Mild intermittent asthma without complication  H60.73     2. Former smoker  Z87.891     3. Nodule of middle lobe of right lung  R91.1 CT Chest Wo Contrast    4. Hypoxemia  R09.02     5. Centrilobular emphysema ([place])  J43.2     6. Chronic obstructive pulmonary disease, unspecified COPD type ([place])  J44.9     7. Cachectic ([place])  R64       Assessment:   This is an 80 year old female, right lung nodule stable in size.  Followed for a year now.  Repeat scan was reviewed from April 2022.  She is a former smoker, 50+ years at about a half a pack a day.  She has evidence of centrilobular emphysema.  Ongoing weight loss and cachexia.  At this point I think she has severe protein calorie malnutrition  Plan: We talked about the importance of her dietary intake Encouraged follow-up with primary care and nutritionist as scheduled. Continue current inhaler regimen and as it is affordable to her. We need to walk her today in the office. She is hypoxemic at rest and will likely qualify for oxygen. If needed we will start her on O2 supplementation.  Of note Megace can increase risk for DVT risk.  We appreciate the consultation.  Repeat  noncontrasted CT scan ordered for April 2023. Patient to follow-up with [place] in April after CT imaging complete.  Refills given for inhalers and albuterol today.     Current Outpatient Medications:    Accu-Chek Softclix Lancets lancets, Use as instructed, Disp: 100 each, Rfl: 12   amLODipine (NORVASC) 10 MG tablet, TAKE 1 TABLET BY MOUTH EVERY DAY, Disp: 90 tablet, Rfl: 1    aspirin (BAYER ASPIRIN) 325 MG tablet, Take 1 tablet (325 mg total) by mouth daily., Disp: 100 tablet, Rfl: 3   blood glucose meter kit and supplies KIT, Dispense based on patient and insurance preference. Use up to four times daily as directed. (FOR ICD-9 250.00, 250.01)., Disp: 1 each, Rfl: 0   diclofenac sodium (VOLTAREN) 1 % GEL, Apply 2 g topically 4 (four) times daily., Disp: 100 g, Rfl: 1   glucose blood (ACCU-CHEK AVIVA PLUS) test strip, Use as instructed, Disp: 300 each, Rfl: 3   levothyroxine (SYNTHROID) 25 MCG tablet, TAKE 1 AND 1/2 TABLETS BY MOUTH DAILY, Disp: 135 tablet, Rfl: 0   megestrol (MEGACE ES) 625 MG/5ML suspension, Take 5 mLs (625 mg total) by mouth daily., Disp: 150 mL, Rfl: 1   metFORMIN (GLUCOPHAGE) 500 MG tablet, TAKE 1/2 TABLET BY MOUTH 2 TIMES DAILY, Disp: 90 tablet, Rfl: 0   mirtazapine (REMERON) 15 MG tablet, TAKE 1 TABLET BY MOUTH EVERYDAY AT BEDTIME, Disp: 90 tablet, Rfl: 1   olmesartan (BENICAR) 20 MG tablet, Take 1 tablet (20 mg total) by mouth daily., Disp: 90 tablet, Rfl: 1   rosuvastatin (CRESTOR) 20 MG tablet, TAKE 1 TABLET BY MOUTH EVERY DAY, Disp: 90 tablet, Rfl: 0   albuterol (VENTOLIN HFA) 108 (90 Base) MCG/ACT inhaler, INHALE 2 PUFFS BY MOUTH 4 TIMES A DAY, Disp: 20.1 each, Rfl: 3   fluticasone-salmeterol (WIXELA INHUB) 100-50 MCG/ACT AEPB, TAKE 1 PUFF BY MOUTH EVERY 12 HOURS IN THE MORNING AND IN THE EVENING, Disp: 180 each, Rfl: 11   Garner Nash, DO [facility] 03/12/2021 3:30 PM

## 2021-03-13 DIAGNOSIS — M25661 Stiffness of right knee, not elsewhere classified: Secondary | ICD-10-CM | POA: Diagnosis not present

## 2021-03-13 DIAGNOSIS — R296 Repeated falls: Secondary | ICD-10-CM | POA: Diagnosis not present

## 2021-03-13 DIAGNOSIS — M6259 Muscle wasting and atrophy, not elsewhere classified, multiple sites: Secondary | ICD-10-CM | POA: Diagnosis not present

## 2021-03-13 DIAGNOSIS — R269 Unspecified abnormalities of gait and mobility: Secondary | ICD-10-CM | POA: Diagnosis not present

## 2021-03-17 ENCOUNTER — Ambulatory Visit: Payer: Medicare Other

## 2021-03-17 ENCOUNTER — Other Ambulatory Visit: Payer: Self-pay

## 2021-03-17 VITALS — BP 148/70 | HR 45 | Temp 98.1°F | Ht 61.0 in | Wt 89.0 lb

## 2021-03-17 DIAGNOSIS — E538 Deficiency of other specified B group vitamins: Secondary | ICD-10-CM

## 2021-03-17 DIAGNOSIS — R269 Unspecified abnormalities of gait and mobility: Secondary | ICD-10-CM | POA: Diagnosis not present

## 2021-03-17 DIAGNOSIS — M25661 Stiffness of right knee, not elsewhere classified: Secondary | ICD-10-CM | POA: Diagnosis not present

## 2021-03-17 DIAGNOSIS — R296 Repeated falls: Secondary | ICD-10-CM | POA: Diagnosis not present

## 2021-03-17 DIAGNOSIS — M6259 Muscle wasting and atrophy, not elsewhere classified, multiple sites: Secondary | ICD-10-CM | POA: Diagnosis not present

## 2021-03-17 MED ORDER — CYANOCOBALAMIN 1000 MCG/ML IJ SOLN
1000.0000 ug | Freq: Once | INTRAMUSCULAR | Status: AC
  Administered 2021-03-24: 1000 ug via INTRAMUSCULAR

## 2021-03-17 NOTE — Progress Notes (Signed)
Pt is here for b12 injection.

## 2021-03-19 DIAGNOSIS — R269 Unspecified abnormalities of gait and mobility: Secondary | ICD-10-CM | POA: Diagnosis not present

## 2021-03-19 DIAGNOSIS — R296 Repeated falls: Secondary | ICD-10-CM | POA: Diagnosis not present

## 2021-03-19 DIAGNOSIS — M6259 Muscle wasting and atrophy, not elsewhere classified, multiple sites: Secondary | ICD-10-CM | POA: Diagnosis not present

## 2021-03-19 DIAGNOSIS — M25661 Stiffness of right knee, not elsewhere classified: Secondary | ICD-10-CM | POA: Diagnosis not present

## 2021-03-23 ENCOUNTER — Ambulatory Visit: Payer: Medicare Other

## 2021-03-23 DIAGNOSIS — E538 Deficiency of other specified B group vitamins: Secondary | ICD-10-CM

## 2021-03-23 DIAGNOSIS — R630 Anorexia: Secondary | ICD-10-CM

## 2021-03-23 DIAGNOSIS — I10 Essential (primary) hypertension: Secondary | ICD-10-CM

## 2021-03-23 DIAGNOSIS — E46 Unspecified protein-calorie malnutrition: Secondary | ICD-10-CM

## 2021-03-23 DIAGNOSIS — R634 Abnormal weight loss: Secondary | ICD-10-CM

## 2021-03-23 NOTE — Chronic Care Management (AMB) (Signed)
Chronic Care Management    Social Work Note  03/23/2021 Name: Leslie Reeves MRN: [id number] DOB: 13-Jun-1941  Leslie Reeves is a 80 y.o. year old female who is a primary care patient of Leslie Reeves, [place]. The CCM team was consulted to assist the patient with chronic disease management and/or care coordination needs related to:  HTN, Prediabetes, Decreased Appetite, Abnormal Weight Loss, and Increased Forgetfulness .   Engaged with patients daughter Leslie Reeves by phone  for follow up visit in response to provider referral for social work chronic care management and care coordination services.   Consent to Services:  The patient was given information about Chronic Care Management services, agreed to services, and gave verbal consent prior to initiation of services.  Please see initial visit note for detailed documentation.   Patient agreed to services and consent obtained.   Assessment: Review of patient past medical history, allergies, medications, and health status, including review of relevant consultants reports was performed today as part of a comprehensive evaluation and provision of chronic care management and care coordination services.     SDOH (Social Determinants of Health) assessments and interventions performed:    Advanced Directives Status: See Care Plan for related entries.  CCM Care Plan  Allergies  Allergen Reactions   Penicillins Rash    Outpatient Encounter Medications as of 03/23/2021  Medication Sig   Accu-Chek Softclix Lancets lancets Use as instructed   albuterol (VENTOLIN HFA) 108 (90 Base) MCG/ACT inhaler INHALE 2 PUFFS BY MOUTH 4 TIMES A DAY   amLODipine (NORVASC) 10 MG tablet TAKE 1 TABLET BY MOUTH EVERY DAY   aspirin (BAYER ASPIRIN) 325 MG tablet Take 1 tablet (325 mg total) by mouth daily.   blood glucose meter kit and supplies KIT Dispense based on patient and insurance preference. Use up to four times daily as directed. (FOR ICD-9 250.00, 250.01).    diclofenac sodium (VOLTAREN) 1 % GEL Apply 2 g topically 4 (four) times daily.   fluticasone-salmeterol (WIXELA INHUB) 100-50 MCG/ACT AEPB TAKE 1 PUFF BY MOUTH EVERY 12 HOURS IN THE MORNING AND IN THE EVENING   glucose blood (ACCU-CHEK AVIVA PLUS) test strip Use as instructed   levothyroxine (SYNTHROID) 25 MCG tablet TAKE 1 AND 1/2 TABLETS BY MOUTH DAILY   megestrol (MEGACE ES) 625 MG/5ML suspension Take 5 mLs (625 mg total) by mouth daily.   metFORMIN (GLUCOPHAGE) 500 MG tablet TAKE 1/2 TABLET BY MOUTH 2 TIMES DAILY   mirtazapine (REMERON) 15 MG tablet TAKE 1 TABLET BY MOUTH EVERYDAY AT BEDTIME   olmesartan (BENICAR) 20 MG tablet Take 1 tablet (20 mg total) by mouth daily.   rosuvastatin (CRESTOR) 20 MG tablet TAKE 1 TABLET BY MOUTH EVERY DAY   Facility-Administered Encounter Medications as of 03/23/2021  Medication   cyanocobalamin ((VITAMIN B-12)) injection 1,000 mcg    Patient Active Problem List   Diagnosis Date Noted   Solitary pulmonary nodule 05/05/2020   Mild intermittent asthma 05/05/2020   Decreased appetite 02/05/2019   Prediabetes 02/05/2019   Essential hypertension 02/05/2019   Right thyroid nodule 10/09/2018    Conditions to be addressed/monitored:  HTN, Decreased Appetite, and Abnormal Weightloss ;  Advance Directive Education, Navigation of health plan benefits, care coordination  Care Plan : Social Work [place]  Updates made by Leslie Reeves since 03/23/2021 12:00 AM     Problem: Barriers to Treatment      Goal: Barriers to Treatment Identified and Managed   Start Date: 03/02/2021  Expected End Date: 04/16/2021  This Visit's Progress: On track  Priority: High  Note:   Current Barriers:  Chronic disease management support and education needs related to  HTN, Decreased Appetite, and Abnormal Weight loss   Patient and daughter unsure how to obtain Oxygen Decline in memory leading to gaps in care due to patient forgetfulness  Social Worker Clinical  Goal(s):  patient will work with SW to identify and address any acute and/or chronic care coordination needs related to the self health management of  HTN, Decreased Appetite, and Abnormal Weight loss    Patient will follow up with primary provider regarding need for B12 injections and oxygen as directed by SW  SW Interventions:  Inter-disciplinary care team collaboration (see longitudinal plan of care) Collaboration with Leslie Reeves, [place] regarding development and update of comprehensive plan of care as evidenced by provider attestation and co-signature Performed chart review to note patient recently seen by primary provider, Leslie Brine FNP on 6.13.22  During this visit patient received first B12 injection - she has received the second injection with third and fourth scheduled for 6.28.22 and 7.6.22 Primary care provider referred the patient to a nutritionist for malnutrition - noted upcoming appointment scheduled for 6.29.22 Primary care provider referred the patient to neurology for memory change - noted upcoming appointment scheduled for 9.12.22 Successful outbound call placed to the patients daughter Leslie Reeves to assess goal progression Discussed the patient is doing well in the home Determined the patient did attend Pulmonology appointment with Dr. Valeta Reeves on 6.16.22 - the patient is to follow up in April 2023 Mrs. Leslie Reeves indicates the patient has not received oxygen but she was under the impression this was ordered Reviewed pulmonology note where Dr. Valeta Reeves indicated a walk test would be performed - unable to locate results of test or order for oxygen Mrs. Leslie Reeves reports she thought the patients primary care provider had ordered the patient oxygen Advised Mrs. Leslie Reeves SW would collaborate with patients primary provider to follow up on status or orders Collaboration with Leslie Brine, FNP requesting follow up regarding oxygen orders Discussed the patient was recently referred to physical  therapy and is participating in outpatient PT twice weekly on Tuesdays and Thursdays Reviewed upcomming appointments with Mrs. Leslie Reeves - she confirms knowledge of appointments and denies transportation barriers Scheduled follow up call over the next month Collaboration with RN Care Manager to provide an update on goal progression  Patient Goals/Self-Care Activities patient will: with the help of her daughter  -  Attend upcomming appointments -Contact SW as needed prior to next scheduled call  Follow Up Plan:  SW will follow up with the patient over the next month     Problem: Quality of Life (General Plan of Care)      Long-Range Goal: Quality of Life Maintained   Start Date: 03/02/2021  Expected End Date: 05/31/2021  Priority: Medium  Note:   Current Barriers:  Chronic disease management support and education needs related to  HTN, Decreased Appetite, and Abnormal Weight loss   Limited knowledge of health plan benefit Limited knowledge of the importance of an Advance Directive  Social Worker Clinical Goal(s):  patient will work with SW to identify and address any acute and/or chronic care coordination needs related to the self health management of  HTN, Decreased Appetite, and Abnormal Weight Loss Patient will work with Consulting civil engineer to develop an individualized plan of care Patient will work with SW to learn more about Advance Directives Patient  will work with SW to become more knowledgeable of health plan benefits   SW Interventions:  Inter-disciplinary care team collaboration (see longitudinal plan of care) Collaboration with Leslie Reeves, [place] regarding development and update of comprehensive plan of care as evidenced by provider attestation and co-signature Successful outbound call placed to the patient to assess goal progression - spoke with the patients daughter Leslie Reeves Determined the patient has yet to receive mailed resource Advised Mrs. Leslie Reeves SW would re-send  Advance Directive packet Scheduled follow up call over the next month  Patient Goals/Self-Care Activities patient will: With the help of her daughter  -  Review mailed resource information -Engage with Consulting civil engineer during scheduled appointment -Contact SW as needed prior to next scheduled call  Follow Up Plan:  SW will follow up with the patient and her daughter over the next month         Follow Up Plan: SW will follow up with patient by phone over the next month      Leslie Reeves, BSW, CDP Social Worker, Certified Dementia Practitioner [place] [facility] [place] [facility] [phone]  Total time spent performing care coordination and/or care management activities with the patient by phone or face to face = 45 minutes.

## 2021-03-23 NOTE — Patient Instructions (Signed)
Social Worker Visit Information  Goals we discussed today:   Goals Addressed             This Visit's Progress    Barriers to Treatment Identified and Managed   On track    Timeframe:  Short-Term Goal Priority:  High Start Date:  6.6.22                           Expected End Date: 7.21.22                       Next planned outreach: 7.29.22  Patient Goals/Self-Care Activities patient will: with the help of her daughter  - Attend upcomming appointments -Contact SW as needed prior to next scheduled call      Quality of Life Maintained       Timeframe:  Long-Range Goal Priority:  Medium Start Date:  6.6.22                           Expected End Date: 9.4.22                      Next planned outreach: 7.29.22  Patient Goals/Self-Care Activities patient will: With the help of her daughter  - Review mailed resource information -Engage with Consulting civil engineer during scheduled appointment -Contact SW as needed prior to next scheduled call          Materials Provided: Yes: Programmer, applications  Follow Up Plan: SW will follow up with patient by phone over the next month  [name], BSW, CDP Social Worker, Certified Dementia Practitioner [place] [facility] [place] [facility] [phone]

## 2021-03-24 ENCOUNTER — Ambulatory Visit (INDEPENDENT_AMBULATORY_CARE_PROVIDER_SITE_OTHER): Payer: Medicare Other

## 2021-03-24 ENCOUNTER — Other Ambulatory Visit: Payer: Self-pay

## 2021-03-24 VITALS — BP 142/80 | HR 64 | Temp 98.1°F | Ht 61.0 in | Wt 89.2 lb

## 2021-03-24 DIAGNOSIS — E538 Deficiency of other specified B group vitamins: Secondary | ICD-10-CM | POA: Diagnosis not present

## 2021-03-24 MED ORDER — CYANOCOBALAMIN 1000 MCG/ML IJ SOLN
1000.0000 ug | Freq: Once | INTRAMUSCULAR | Status: AC
  Administered 2021-03-24: 1000 ug via INTRAMUSCULAR

## 2021-03-24 NOTE — Progress Notes (Signed)
Pt is here for b12 injection.

## 2021-03-25 ENCOUNTER — Other Ambulatory Visit: Payer: Self-pay

## 2021-03-25 ENCOUNTER — Encounter: Payer: Self-pay | Admitting: Registered"

## 2021-03-25 ENCOUNTER — Encounter: Payer: Medicare Other | Attending: Nurse Practitioner | Admitting: Registered"

## 2021-03-25 DIAGNOSIS — R0600 Dyspnea, unspecified: Secondary | ICD-10-CM

## 2021-03-25 DIAGNOSIS — E441 Mild protein-calorie malnutrition: Secondary | ICD-10-CM | POA: Insufficient documentation

## 2021-03-25 DIAGNOSIS — R0609 Other forms of dyspnea: Secondary | ICD-10-CM

## 2021-03-25 NOTE — Progress Notes (Signed)
Medical Nutrition Therapy  Appointment Start time:  9:20  Appointment End time:  10:15  Primary concerns today: need a plan to help with food intake  Referral diagnosis: malnutrition  Preferred learning style: no preference indicated Learning readiness: change in progress   NUTRITION ASSESSMENT   Pt arrives with daughter. Daughter states pt needs a better eating plan to help with preventing weight loss.  States mom used to wear size 18 and has been declining in weight over the years. States since 2019 eating changed drastically. Pt states her taste buds changed and she didn't taste anything. Pt states she has not been taking megestrol because she doesn't like the way it tastes. Reports sweet rolls taste bitter to her.   Pt states she and daughter live together. Daughter states she cooks meals in the home. States food is available and tries to get pt to eat throughout the day but pt will not eat meals except Sunday dinner sometimes. States pt will grab a few pieces of ham and eat or other small items. Pt states she has days that she does not eat at all. Pt denies challenges with chewing and swallowing foods.   Daughter states pt has low oxygen levels. Reports pt is awake for about 4-5 hours a day and sleeps the remainder of the time. Reports pt sleeps a lot. Pt states gets tired from making bed and is cold a lot.   Pt states she likes to stay in her room where its warm. States she doesn't like to go outside because its cold although weather is warm.   Safe foods: fish, stewed chicken, mashed potatoes, baked chicken, black eyed peas, [place] wings, ice cream, cabbage, green beans, pinto beans, collard greens, salads, broccoli and cheese, brussels sprouts, pears, bananas, apples, tangerines, watermelon, strawberries, cashews, spanish peanuts, almonds with raisins, pecans, walnuts  Avoided foods: none reported   Clinical Medical Hx: HTN, Type 2 diabetes, COPD, hyperlipidemia Medications: See  list Labs: See list Notable Signs/Symptoms: constipation, lightheadedness, cold intolerance  Estimated daily fluid intake: ~12-20 oz Supplements: See list Sleep: 15+ hrs Stress / self-care: N/A Current average weekly physical activity: physical therapy 60 min, 2x/week  24-Hr Dietary Recall First Meal: typically skips Snack:  Second Meal: 1-2 pieces of ham Snack:  Third Meal: sometimes 2 baked chicken legs + butter beans + mashed potatoes + roll Snack:  Beverages: water (12 oz), soda (8 oz)   NUTRITION DIAGNOSIS  NI-1.4 Inadequate energy intake As related to malnutrition.  As evidenced by observations of unwilling to eat sufficient energy to maintain healthy weight.   NUTRITION INTERVENTION  Nutrition education (E-1) on the following topics:  Pt and pt's daughter was educated and counseled on ways to increase nourishment for patient. Discussed benefits of having nutritional supplement. Encouraged pt's daughter to continue providing calorically dense food options. Discussed benefits of increasing nourishment and hydration to help with signs/symptoms present. Pt agreed with goals listed.  Handouts Provided Include  None provided  Learning Style & Readiness for Change Teaching method utilized: Visual & Auditory  Demonstrated degree of understanding via: Teach Back  Barriers to learning/adherence to lifestyle change: none identified  Goals Established by Pt Aim to drink about 8 oz of water every hour starting at 11 am.  Aim to have Glucerna 2 times a day in the morning and before bed.  Aim to eat dinner with 1/2 plate starch/grain + 1/4 plate of fruit or vegetable + 1/4 plate of protein + ice cream as dessert.  Contact [employer] for weekly deliveries [phone].    MONITORING & EVALUATION Dietary intake, weekly physical activity.  Next Steps  Patient is to follow up in 6 weeks due to provider's schedule.

## 2021-03-25 NOTE — Patient Instructions (Addendum)
-  Aim to drink about 8 oz of water every hour starting at 11 am.   - Aim to have Glucerna 2 times a day in the morning and before bed.   - Aim to eat dinner with 1/2 plate starch/grain + 1/4 plate of fruit or vegetable + 1/4 plate of protein + ice cream as dessert.   - Contact [employer] for weekly deliveries [phone].

## 2021-03-26 DIAGNOSIS — M25661 Stiffness of right knee, not elsewhere classified: Secondary | ICD-10-CM | POA: Diagnosis not present

## 2021-03-26 DIAGNOSIS — M6259 Muscle wasting and atrophy, not elsewhere classified, multiple sites: Secondary | ICD-10-CM | POA: Diagnosis not present

## 2021-03-26 DIAGNOSIS — R269 Unspecified abnormalities of gait and mobility: Secondary | ICD-10-CM | POA: Diagnosis not present

## 2021-03-26 DIAGNOSIS — R296 Repeated falls: Secondary | ICD-10-CM | POA: Diagnosis not present

## 2021-03-27 ENCOUNTER — Telehealth: Payer: Self-pay

## 2021-03-27 NOTE — Telephone Encounter (Signed)
Called and spoke with patient and her daughter per DPR to let them know that we received a call from [facility] about the order for oxygen. According to her chart she was walked in the office but there is no documentation that patient as placed on oxygen and that she recovered higher than 85%. Advised them that we need to get her back into the office to do another walk so that we can document how many liters she needs and what her O2 sats recovered to. They expressed understanding. She is now scheduled for 04/03/21 on 6 min walk schedule for qualifying walk. Nothing further needed at this time.

## 2021-03-31 DIAGNOSIS — R269 Unspecified abnormalities of gait and mobility: Secondary | ICD-10-CM | POA: Diagnosis not present

## 2021-03-31 DIAGNOSIS — R296 Repeated falls: Secondary | ICD-10-CM | POA: Diagnosis not present

## 2021-03-31 DIAGNOSIS — M6259 Muscle wasting and atrophy, not elsewhere classified, multiple sites: Secondary | ICD-10-CM | POA: Diagnosis not present

## 2021-03-31 DIAGNOSIS — M25661 Stiffness of right knee, not elsewhere classified: Secondary | ICD-10-CM | POA: Diagnosis not present

## 2021-04-01 ENCOUNTER — Ambulatory Visit (INDEPENDENT_AMBULATORY_CARE_PROVIDER_SITE_OTHER): Payer: Medicare Other

## 2021-04-01 ENCOUNTER — Other Ambulatory Visit: Payer: Self-pay

## 2021-04-01 ENCOUNTER — Ambulatory Visit (INDEPENDENT_AMBULATORY_CARE_PROVIDER_SITE_OTHER): Payer: Medicare Other | Admitting: Nurse Practitioner

## 2021-04-01 VITALS — BP 130/70 | HR 62 | Temp 98.4°F | Ht 61.6 in | Wt 87.3 lb

## 2021-04-01 VITALS — BP 130/70 | HR 62 | Temp 98.4°F | Ht 61.6 in | Wt 87.4 lb

## 2021-04-01 DIAGNOSIS — E039 Hypothyroidism, unspecified: Secondary | ICD-10-CM

## 2021-04-01 DIAGNOSIS — I129 Hypertensive chronic kidney disease with stage 1 through stage 4 chronic kidney disease, or unspecified chronic kidney disease: Secondary | ICD-10-CM

## 2021-04-01 DIAGNOSIS — E538 Deficiency of other specified B group vitamins: Secondary | ICD-10-CM | POA: Diagnosis not present

## 2021-04-01 DIAGNOSIS — E041 Nontoxic single thyroid nodule: Secondary | ICD-10-CM

## 2021-04-01 DIAGNOSIS — Z23 Encounter for immunization: Secondary | ICD-10-CM

## 2021-04-01 DIAGNOSIS — N183 Chronic kidney disease, stage 3 unspecified: Secondary | ICD-10-CM | POA: Diagnosis not present

## 2021-04-01 DIAGNOSIS — N1832 Chronic kidney disease, stage 3b: Secondary | ICD-10-CM | POA: Diagnosis not present

## 2021-04-01 DIAGNOSIS — Z0000 Encounter for general adult medical examination without abnormal findings: Secondary | ICD-10-CM

## 2021-04-01 MED ORDER — PREVNAR 20 0.5 ML IM SUSY: 0.5000 mL | PREFILLED_SYRINGE | INTRAMUSCULAR | 0 refills | Status: DC

## 2021-04-01 MED ORDER — CYANOCOBALAMIN 1000 MCG/ML IJ SOLN
1000.0000 ug | Freq: Once | INTRAMUSCULAR | Status: AC
  Administered 2021-04-01: 1000 ug via INTRAMUSCULAR

## 2021-04-01 MED ORDER — SHINGRIX 50 MCG/0.5ML IM SUSR: 0.5000 mL | Freq: Once | INTRAMUSCULAR | 0 refills | Status: AC

## 2021-04-01 NOTE — Patient Instructions (Signed)

## 2021-04-01 NOTE — Progress Notes (Signed)
This visit occurred during the SARS-CoV-2 public health emergency.  Safety protocols were in place, including screening questions prior to the visit, additional usage of staff PPE, and extensive cleaning of exam room while observing appropriate contact time as indicated for disinfecting solutions.  Subjective:   Leslie Reeves is a 80 y.o. female who presents for Medicare Annual (Subsequent) preventive examination.  Review of Systems     Cardiac Risk Factors include: advanced age (>11mn, >>65women);hypertension;sedentary lifestyle     Objective:    Today's Vitals   04/01/21 1037  BP: 130/70  Pulse: 62  Temp: 98.4 F (36.9 C)  TempSrc: Oral  SpO2: 92%  Weight: 87 lb 6.4 oz (39.6 kg)  Height: 5' 1.6" (1.565 m)   Body mass index is 16.19 kg/m.  Advanced Directives 04/01/2021 03/25/2021 03/02/2021 03/02/2021 03/26/2020 03/21/2019  Does Patient Have a Medical Advance Directive? _0  No  Would patient like information on creating a medical advance directive? No - Patient declined Yes (MAU/Ambulatory/Procedural Areas - Information given) Yes (MAU/Ambulatory/Procedural Areas - Information given) No - Patient declined - -    Current Medications (verified) Outpatient Encounter Medications as of 04/01/2021  Medication Sig   Accu-Chek Softclix Lancets lancets Use as instructed   albuterol (VENTOLIN HFA) 108 (90 Base) MCG/ACT inhaler INHALE 2 PUFFS BY MOUTH 4 TIMES A DAY   amLODipine (NORVASC) 10 MG tablet TAKE 1 TABLET BY MOUTH EVERY DAY   aspirin (BAYER ASPIRIN) 325 MG tablet Take 1 tablet (325 mg total) by mouth daily.   blood glucose meter kit and supplies KIT Dispense based on patient and insurance preference. Use up to four times daily as directed. (FOR ICD-9 250.00, 250.01).   diclofenac sodium (VOLTAREN) 1 % GEL Apply 2 g topically 4 (four) times daily.   fluticasone-salmeterol (WIXELA INHUB) 100-50 MCG/ACT AEPB TAKE 1 PUFF BY MOUTH EVERY 12 HOURS IN THE MORNING AND IN THE  EVENING   glucose blood (ACCU-CHEK AVIVA PLUS) test strip Use as instructed   levothyroxine (SYNTHROID) 25 MCG tablet TAKE 1 AND 1/2 TABLETS BY MOUTH DAILY   megestrol (MEGACE ES) 625 MG/5ML suspension Take 5 mLs (625 mg total) by mouth daily.   metFORMIN (GLUCOPHAGE) 500 MG tablet TAKE 1/2 TABLET BY MOUTH 2 TIMES DAILY   mirtazapine (REMERON) 15 MG tablet TAKE 1 TABLET BY MOUTH EVERYDAY AT BEDTIME   olmesartan (BENICAR) 20 MG tablet Take 1 tablet (20 mg total) by mouth daily.   Vitamin D, Ergocalciferol, (DRISDOL) 1.25 MG (50000 UNIT) CAPS capsule TAKE ONE CAPSULE BY MOUTH TWICE A WEEK   rosuvastatin (CRESTOR) 20 MG tablet TAKE 1 TABLET BY MOUTH EVERY DAY (Patient not taking: Reported on 04/01/2021)   No facility-administered encounter medications on file as of 04/01/2021.    Allergies (verified) Penicillins   History: Past Medical History:  Diagnosis Date   Allergy    Diabetes mellitus without complication ([place]    Hypertension    Past Surgical History:  Procedure Laterality Date   ABDOMINAL HYSTERECTOMY     Family History  Problem Relation Age of Onset   Heart disease Other    Stroke Other    Hypertension Other    COPD Other    Cancer Other    Asthma Other    Breast cancer Neg Hx    Social History   Socioeconomic History   Marital status: Married    Spouse name: Not on file   Number of children: Not on file   Years  of education: Not on file   Highest education level: Not on file  Occupational History   Occupation: retired  Tobacco Use   Smoking status: Former    Packs/day: 0.50    Years: 15.00    Pack years: 7.50    Types: Cigarettes    Quit date: 2017    Years since quitting: 5.5   Smokeless tobacco: Never  Vaping Use   Vaping Use: Never used  Substance and Sexual Activity   Alcohol use: Not Currently   Drug use: Not Currently   Sexual activity: Not Currently  Other Topics Concern   Not on file  Social History Narrative   Not on file   Social  Determinants of Health   Financial Resource Strain: Low Risk    Difficulty of Paying Living Expenses: Not hard at all  Food Insecurity: No Food Insecurity   Worried About Charity fundraiser in the Last Year: Never true   [place] in the Last Year: Never true  Transportation Needs: No Transportation Needs   Lack of Transportation (Medical): No   Lack of Transportation (Non-Medical): No  Physical Activity: Inactive   Days of Exercise per Week: 0 days   Minutes of Exercise per Session: 0 min  Stress: No Stress Concern Present   Feeling of Stress : Not at all  Social Connections: Not on file    Tobacco Counseling Counseling given: Not Answered   Clinical Intake:  Pre-visit preparation completed: Yes  Pain : No/denies pain     Nutritional Status: BMI <19  Underweight Nutritional Risks: None Diabetes: No  How often do you need to have someone help you when you read instructions, pamphlets, or other written materials from your doctor or pharmacy?: 1 - Never  Diabetic? no  Interpreter Needed?: No  Information entered by :: NAllen LPN   Activities of Daily Living In your present state of health, do you have any difficulty performing the following activities: 04/01/2021  Hearing? Y  Vision? N  Difficulty concentrating or making decisions? Y  Walking or climbing stairs? Y  Comment due to right leg  Dressing or bathing? N  Doing errands, shopping? N  Preparing Food and eating ? N  Using the Toilet? N  In the past six months, have you accidently leaked urine? Y  Comment wears a pad  Do you have problems with loss of bowel control? N  Managing your Medications? N  Managing your Finances? N  Housekeeping or managing your Housekeeping? N  Some recent data might be hidden    Patient Care Team: [name], FNP as PCP - General (General Practice) Little, [name], RN as [place] as [place] any recent [place] you may have received from other than [facility] providers in the past year (date may be approximate).     Assessment:   This is a routine wellness examination for Leslie Reeves.  Hearing/Vision screen Vision Screening - Comments:: Regular eye exams, [facility]  Dietary issues and exercise activities discussed: Current Exercise Habits: The patient does not participate in regular exercise at present   Goals Addressed             This Visit's Progress    Patient Stated       04/01/2021, just wants to get better        Depression Screen [facility] 2/9 Scores 04/01/2021 03/25/2021 03/26/2020 01/02/2020 11/21/2019 03/21/2019 02/05/2019  PHQ - 2 Score 0 0 0 0 0 0 0  PHQ- 9 Score - - - - - 3 -    Fall Risk Fall Risk  04/01/2021 03/25/2021 03/26/2020 01/02/2020 11/21/2019  Falls in the past year? 0 0 0 0 0  Number falls in past yr: - 0 - - -  Risk for fall due to : Medication side effect - Medication side effect - -  Follow up Falls evaluation completed;Education provided;Falls prevention discussed - Falls evaluation completed;Education provided;Falls prevention discussed - -    FALL RISK PREVENTION PERTAINING TO THE HOME:  Any stairs in or around the home? No  If so, are there any without handrails? N/a Home free of loose throw rugs in walkways, pet beds, electrical cords, etc? Yes  Adequate lighting in your home to reduce risk of falls? Yes   ASSISTIVE DEVICES UTILIZED TO PREVENT FALLS:  Life alert? No  Use of a cane, walker or w/c? No  Grab bars in the bathroom? No  Shower chair or bench in shower? No  Elevated toilet seat or a handicapped toilet? No   TIMED UP AND GO:  Was the test performed? No .     Gait slow and steady without use of assistive device  Cognitive Function:     6CIT Screen 04/01/2021 01/20/2021 03/26/2020 03/21/2019  What Year? 0 points 0 points 0 points 0 points  What month? 0 points 0 points 0 points 0 points  What time? 0  points 0 points 0 points 0 points  Count back from 20 0 points 0 points 0 points 0 points  Months in reverse 0 points 2 points 0 points 0 points  Repeat phrase 10 points 6 points 6 points 2 points  Total Score _0 Immunizations Immunization History  Administered Date(s) Administered   Influenza,inj,Quad PF,6+ Mos 07/06/2018   Influenza-Unspecified 06/27/2013, 06/28/2019   PFIZER(Purple Top)SARS-COV-2 Vaccination 11/17/2019, 12/10/2019, 07/21/2020    TDAP status: Due, Education has been provided regarding the importance of this vaccine. Advised may receive this vaccine at local pharmacy or Health Dept. Aware to provide a copy of the vaccination record if obtained from local pharmacy or Health Dept. Verbalized acceptance and understanding.  Flu Vaccine status: Up to date  Pneumococcal vaccine status: Due, Education has been provided regarding the importance of this vaccine. Advised may receive this vaccine at local pharmacy or Health Dept. Aware to provide a copy of the vaccination record if obtained from local pharmacy or Health Dept. Verbalized acceptance and understanding.  Covid-19 vaccine status: Completed vaccines  Qualifies for Shingles Vaccine? Yes   Zostavax completed No   Shingrix Completed?: No.    Education has been provided regarding the importance of this vaccine. Patient has been advised to call insurance company to determine out of pocket expense if they have not yet received this vaccine. Advised may also receive vaccine at local pharmacy or Health Dept. Verbalized acceptance and understanding.  Screening Tests Health Maintenance  Topic Date Due   TETANUS/TDAP  Never done   Zoster Vaccines- Shingrix (1 of 2) Never done   PNA vac Low Risk Adult (1 of 2 - PCV13) Never done   COVID-19 Vaccine (4 - Booster for Pfizer series) 10/21/2020   INFLUENZA VACCINE  04/27/2021   DEXA SCAN  Completed   HPV VACCINES  Aged Out    Health Maintenance  Health Maintenance  Due  Topic Date Due   TETANUS/TDAP  Never done  Zoster Vaccines- Shingrix (1 of 2) Never done   PNA vac Low Risk Adult (1 of 2 - PCV13) Never done   COVID-19 Vaccine (4 - Booster for Pfizer series) 10/21/2020    Colorectal cancer screening: No longer required.   Mammogram status: Completed 07/14/2020. Repeat every year  Bone Density status: scheduled 07/06/2021  Lung Cancer Screening: (Low Dose CT Chest recommended if Age 20-80 years, 30 pack-year currently smoking OR have quit w/in 15years.) does not qualify.   Lung Cancer Screening Referral: no  Additional Screening:  Hepatitis C Screening: does not qualify;   Vision Screening: Recommended annual ophthalmology exams for early detection of glaucoma and other disorders of the eye. Is the patient up to date with their annual eye exam?  Yes  Who is the provider or what is the name of the office in which the patient attends annual eye exams? [facility] If pt is not established with a provider, would they like to be referred to a provider to establish care? No .   Dental Screening: Recommended annual dental exams for proper oral hygiene  Community Resource Referral / Chronic Care Management: CRR required this visit?  No   CCM required this visit?  No      Plan:     I have personally reviewed and noted the following in the patient's chart:   Medical and social history Use of alcohol, tobacco or illicit drugs  Current medications and supplements including opioid prescriptions.  Functional ability and status Nutritional status Physical activity Advanced directives List of other physicians Hospitalizations, surgeries, and ER visits in previous 12 months Vitals Screenings to include cognitive, depression, and falls Referrals and appointments  In addition, I have reviewed and discussed with patient certain preventive protocols, quality metrics, and best practice recommendations. A written personalized care plan for  preventive services as well as general preventive health recommendations were provided to patient.     Kellie Simmering, LPN   05/01/4964   Nurse Notes:

## 2021-04-01 NOTE — Patient Instructions (Signed)
Ms. Friend , Thank you for taking time to come for your Medicare Wellness Visit. I appreciate your ongoing commitment to your health goals. Please review the following plan we discussed and let me know if I can assist you in the future.   Screening recommendations/referrals: Colonoscopy: not required Mammogram: completed 07/14/2020 Bone Density: scheduled for 07/06/2021 Recommended yearly ophthalmology/optometry visit for glaucoma screening and checkup Recommended yearly dental visit for hygiene and checkup  Vaccinations: Influenza vaccine: due 04/27/2021 Pneumococcal vaccine: completed 03/14/2021 Tdap vaccine: due Shingles vaccine: discussed   Covid-19:03/14/2021, 07/21/2020, 12/10/2019, 11/17/2019  Advanced directives: Advance directive discussed with you today. Even though you declined this today please call our office should you change your mind and we can give you the proper paperwork for you to fill out.  Conditions/risks identified: none  Next appointment: Follow up in one year for your annual wellness visit    Preventive Care 65 Years and Older, Female Preventive care refers to lifestyle choices and visits with your health care provider that can promote health and wellness. What does preventive care include? A yearly physical exam. This is also called an annual well check. Dental exams once or twice a year. Routine eye exams. Ask your health care provider how often you should have your eyes checked. Personal lifestyle choices, including: Daily care of your teeth and gums. Regular physical activity. Eating a healthy diet. Avoiding tobacco and drug use. Limiting alcohol use. Practicing safe sex. Taking low-dose aspirin every day. Taking vitamin and mineral supplements as recommended by your health care provider. What happens during an annual well check? The services and screenings done by your health care provider during your annual well check will depend on your age, overall  health, lifestyle risk factors, and family history of disease. Counseling  Your health care provider may ask you questions about your: Alcohol use. Tobacco use. Drug use. Emotional well-being. Home and relationship well-being. Sexual activity. Eating habits. History of falls. Memory and ability to understand (cognition). Work and work Statistician. Reproductive health. Screening  You may have the following tests or measurements: Height, weight, and BMI. Blood pressure. Lipid and cholesterol levels. These may be checked every 5 years, or more frequently if you are over 86 years old. Skin check. Lung cancer screening. You may have this screening every year starting at age 64 if you have a 30-pack-year history of smoking and currently smoke or have quit within the past 15 years. Fecal occult blood test (FOBT) of the stool. You may have this test every year starting at age 80. Flexible sigmoidoscopy or colonoscopy. You may have a sigmoidoscopy every 5 years or a colonoscopy every 10 years starting at age 51. Hepatitis C blood test. Hepatitis B blood test. Sexually transmitted disease (STD) testing. Diabetes screening. This is done by checking your blood sugar (glucose) after you have not eaten for a while (fasting). You may have this done every 1-3 years. Bone density scan. This is done to screen for osteoporosis. You may have this done starting at age 41. Mammogram. This may be done every 1-2 years. Talk to your health care provider about how often you should have regular mammograms. Talk with your health care provider about your test results, treatment options, and if necessary, the need for more tests. Vaccines  Your health care provider may recommend certain vaccines, such as: Influenza vaccine. This is recommended every year. Tetanus, diphtheria, and acellular pertussis (Tdap, Td) vaccine. You may need a Td booster every 10 years. Zoster vaccine.  You may need this after age  5. Pneumococcal 13-valent conjugate (PCV13) vaccine. One dose is recommended after age 38. Pneumococcal polysaccharide (PPSV23) vaccine. One dose is recommended after age 93. Talk to your health care provider about which screenings and vaccines you need and how often you need them. This information is not intended to replace advice given to you by your health care provider. Make sure you discuss any questions you have with your health care provider. Document Released: 10/10/2015 Document Revised: 06/02/2016 Document Reviewed: 07/15/2015 Elsevier Interactive Patient Education  2017 [employer] [employer] Prevention in the Home Falls can cause injuries. They can happen to people of all ages. There are many things you can do to make your home safe and to help prevent falls. What can I do on the outside of my home? Regularly fix the edges of walkways and driveways and fix any cracks. Remove anything that might make you trip as you walk through a door, such as a raised step or threshold. Trim any bushes or trees on the path to your home. Use bright outdoor lighting. Clear any walking paths of anything that might make someone trip, such as rocks or tools. Regularly check to see if handrails are loose or broken. Make sure that both sides of any steps have handrails. Any raised decks and porches should have guardrails on the edges. Have any leaves, snow, or ice cleared regularly. Use sand or salt on walking paths during winter. Clean up any spills in your garage right away. This includes oil or grease spills. What can I do in the bathroom? Use night lights. Install grab bars by the toilet and in the tub and shower. Do not use towel bars as grab bars. Use non-skid mats or decals in the tub or shower. If you need to sit down in the shower, use a plastic, non-slip stool. Keep the floor dry. Clean up any water that spills on the floor as soon as it happens. Remove soap buildup in the tub or shower  regularly. Attach bath mats securely with double-sided non-slip rug tape. Do not have throw rugs and other things on the floor that can make you trip. What can I do in the bedroom? Use night lights. Make sure that you have a light by your bed that is easy to reach. Do not use any sheets or blankets that are too big for your bed. They should not hang down onto the floor. Have a firm chair that has side arms. You can use this for support while you get dressed. Do not have throw rugs and other things on the floor that can make you trip. What can I do in the kitchen? Clean up any spills right away. Avoid walking on wet floors. Keep items that you use a lot in easy-to-reach places. If you need to reach something above you, use a strong step stool that has a grab bar. Keep electrical cords out of the way. Do not use floor polish or wax that makes floors slippery. If you must use wax, use non-skid floor wax. Do not have throw rugs and other things on the floor that can make you trip. What can I do with my stairs? Do not leave any items on the stairs. Make sure that there are handrails on both sides of the stairs and use them. Fix handrails that are broken or loose. Make sure that handrails are as long as the stairways. Check any carpeting to make sure that it is  firmly attached to the stairs. Fix any carpet that is loose or worn. Avoid having throw rugs at the top or bottom of the stairs. If you do have throw rugs, attach them to the floor with carpet tape. Make sure that you have a light switch at the top of the stairs and the bottom of the stairs. If you do not have them, ask someone to add them for you. What else can I do to help prevent falls? Wear shoes that: Do not have high heels. Have rubber bottoms. Are comfortable and fit you well. Are closed at the toe. Do not wear sandals. If you use a stepladder: Make sure that it is fully opened. Do not climb a closed stepladder. Make sure that  both sides of the stepladder are locked into place. Ask someone to hold it for you, if possible. Clearly mark and make sure that you can see: Any grab bars or handrails. First and last steps. Where the edge of each step is. Use tools that help you move around (mobility aids) if they are needed. These include: Canes. Walkers. Scooters. Crutches. Turn on the lights when you go into a dark area. Replace any light bulbs as soon as they burn out. Set up your furniture so you have a clear path. Avoid moving your furniture around. If any of your floors are uneven, fix them. If there are any pets around you, be aware of where they are. Review your medicines with your doctor. Some medicines can make you feel dizzy. This can increase your chance of falling. Ask your doctor what other things that you can do to help prevent falls. This information is not intended to replace advice given to you by your health care provider. Make sure you discuss any questions you have with your health care provider. Document Released: 07/10/2009 Document Revised: 02/19/2016 Document Reviewed: 10/18/2014 Elsevier Interactive Patient Education  2017 [employer].

## 2021-04-01 NOTE — Progress Notes (Signed)
I,Yamilka Roman [employer] as a Education administrator for [employer], FNP.,have documented all relevant documentation on the behalf of [name], FNP,as directed by  [name], FNP while in the presence of [name], [place].   This visit occurred during the SARS-CoV-2 public health emergency.  Safety protocols were in place, including screening questions prior to the visit, additional usage of staff PPE, and extensive cleaning of exam room while observing appropriate contact time as indicated for disinfecting solutions.  Subjective:     Patient ID: Leslie Reeves , female    DOB: [birth year] , 80 y.o.   MRN: [id number]   Chief Complaint  Patient presents with   B12 Injection    HPI  Patient presents today for a vitamin b12 recheck.  She can not tell the difference with the vitamin b12 injections  Wt Readings from Last 3 Encounters: 04/01/21 : 87 lb 4.8 oz (39.6 kg) 04/01/21 : 87 lb 6.4 oz (39.6 kg) 03/24/21 : 89 lb 3.2 oz (40.5 kg)      Past Medical History:  Diagnosis Date   Allergy    Diabetes mellitus without complication (HCC)    Hypertension      Family History  Problem Relation Age of Onset   Heart disease Other    Stroke Other    Hypertension Other    COPD Other    Cancer Other    Asthma Other    Breast cancer Neg Hx      Current Outpatient Medications:    Accu-Chek Softclix Lancets lancets, Use as instructed, Disp: 100 each, Rfl: 12   albuterol (VENTOLIN HFA) 108 (90 Base) MCG/ACT inhaler, INHALE 2 PUFFS BY MOUTH 4 TIMES A DAY, Disp: 20.1 each, Rfl: 3   amLODipine (NORVASC) 10 MG tablet, TAKE 1 TABLET BY MOUTH EVERY DAY, Disp: 90 tablet, Rfl: 1   aspirin (BAYER ASPIRIN) 325 MG tablet, Take 1 tablet (325 mg total) by mouth daily., Disp: 100 tablet, Rfl: 3   blood glucose meter kit and supplies KIT, Dispense based on patient and insurance preference. Use up to four times daily as directed. (FOR ICD-9 250.00, 250.01)., Disp: 1 each, Rfl: 0   diclofenac sodium  (VOLTAREN) 1 % GEL, Apply 2 g topically 4 (four) times daily., Disp: 100 g, Rfl: 1   fluticasone-salmeterol (WIXELA INHUB) 100-50 MCG/ACT AEPB, TAKE 1 PUFF BY MOUTH EVERY 12 HOURS IN THE MORNING AND IN THE EVENING, Disp: 180 each, Rfl: 11   glucose blood (ACCU-CHEK AVIVA PLUS) test strip, Use as instructed, Disp: 300 each, Rfl: 3   levothyroxine (SYNTHROID) 25 MCG tablet, TAKE 1 AND 1/2 TABLETS BY MOUTH DAILY, Disp: 135 tablet, Rfl: 0   megestrol (MEGACE ES) 625 MG/5ML suspension, Take 5 mLs (625 mg total) by mouth daily., Disp: 150 mL, Rfl: 1   metFORMIN (GLUCOPHAGE) 500 MG tablet, TAKE 1/2 TABLET BY MOUTH 2 TIMES DAILY, Disp: 90 tablet, Rfl: 0   mirtazapine (REMERON) 15 MG tablet, TAKE 1 TABLET BY MOUTH EVERYDAY AT BEDTIME, Disp: 90 tablet, Rfl: 1   olmesartan (BENICAR) 20 MG tablet, Take 1 tablet (20 mg total) by mouth daily., Disp: 90 tablet, Rfl: 1   rosuvastatin (CRESTOR) 20 MG tablet, TAKE 1 TABLET BY MOUTH EVERY DAY (Patient not taking: No sig reported), Disp: 90 tablet, Rfl: 0   Tdap (BOOSTRIX) 5-2.5-18.5 LF-MCG/0.5 injection, Inject 0.5 mLs into the muscle once for 1 dose., Disp: 0.5 mL, Rfl: 0   Vitamin D, Ergocalciferol, (DRISDOL) 1.25 MG (50000 UNIT) CAPS capsule, TAKE  ONE CAPSULE BY MOUTH TWICE A WEEK, Disp: , Rfl:    Zoster Vaccine Adjuvanted (SHINGRIX) injection, Inject 0.5 mLs into the muscle once for 1 dose., Disp: 0.5 mL, Rfl: 0   Allergies  Allergen Reactions   Penicillins Rash     Review of Systems  Constitutional: Negative.   Eyes: Negative.   Gastrointestinal: Negative.   Skin: Negative.   Psychiatric/Behavioral: Negative.      Today's Vitals   04/01/21 1049  BP: 130/70  Pulse: 62  Temp: 98.4 F (36.9 C)  Weight: 87 lb 4.8 oz (39.6 kg)  Height: 5' 1.6" (1.565 m)   Body mass index is 16.18 kg/m.   Objective:  Physical Exam Vitals reviewed.  Constitutional:      General: She is not in acute distress.    Comments: Thin appearance  Cardiovascular:      Rate and Rhythm: Normal rate and regular rhythm.     Pulses: Normal pulses.     Heart sounds: Normal heart sounds. No murmur heard. Pulmonary:     Effort: Pulmonary effort is normal. No respiratory distress.     Breath sounds: Normal breath sounds.  Skin:    Capillary Refill: Capillary refill takes less than 2 seconds.  Neurological:     General: No focal deficit present.     Mental Status: She is alert and oriented to person, place, and time.     Cranial Nerves: No cranial nerve deficit.  Psychiatric:        Mood and Affect: Mood normal.        Behavior: Behavior normal.        Thought Content: Thought content normal.        Judgment: Judgment normal.        Assessment And Plan:     1. B12 deficiency She will be given her vitamin b12 injection today and check her levels - Vitamin B12 - cyanocobalamin ((VITAMIN B-12)) injection 1,000 mcg  2. Right thyroid nodule Will check thyroid levels - T4 - TSH - T3, free  3. Acquired hypothyroidism Tolerating medications well Will check thyroid levels - T4 - TSH - T3, free  4. Benign hypertension with chronic kidney disease, stage III (HCC) B/P is controlled.  CMP ordered to check renal function.  The importance of regular exercise and dietary modification was stressed to the patient.  - BMP8+eGFR    Patient was given opportunity to ask questions. Patient verbalized understanding of the plan and was able to repeat key elements of the plan. All questions were answered to their satisfaction.  [name], FNP   I, [name], FNP, have reviewed all documentation for this visit. The documentation on 04/01/21 for the exam, diagnosis, procedures, and orders are all accurate and complete.   IF YOU HAVE BEEN REFERRED TO A SPECIALIST, IT MAY TAKE 1-2 WEEKS TO SCHEDULE/PROCESS THE REFERRAL. IF YOU HAVE NOT HEARD FROM US/SPECIALIST IN TWO WEEKS, PLEASE GIVE [place] A CALL AT [phone].   THE PATIENT IS ENCOURAGED TO PRACTICE  SOCIAL DISTANCING DUE TO THE COVID-19 PANDEMIC.

## 2021-04-02 DIAGNOSIS — M6259 Muscle wasting and atrophy, not elsewhere classified, multiple sites: Secondary | ICD-10-CM | POA: Diagnosis not present

## 2021-04-02 DIAGNOSIS — R269 Unspecified abnormalities of gait and mobility: Secondary | ICD-10-CM | POA: Diagnosis not present

## 2021-04-02 DIAGNOSIS — M25661 Stiffness of right knee, not elsewhere classified: Secondary | ICD-10-CM | POA: Diagnosis not present

## 2021-04-02 DIAGNOSIS — R296 Repeated falls: Secondary | ICD-10-CM | POA: Diagnosis not present

## 2021-04-02 LAB — BMP8+EGFR
BUN/Creatinine Ratio: 16 (ref 12–28)
BUN: 19 mg/dL (ref 8–27)
CO2: 26 mmol/L (ref 20–29)
Calcium: 8.9 mg/dL (ref 8.7–10.3)
Chloride: 101 mmol/L (ref 96–106)
Creatinine, Ser: 1.22 mg/dL — ABNORMAL HIGH (ref 0.57–1.00)
Glucose: 93 mg/dL (ref 65–99)
Potassium: 3.9 mmol/L (ref 3.5–5.2)
Sodium: 145 mmol/L — ABNORMAL HIGH (ref 134–144)
eGFR: 45 mL/min/{1.73_m2} — ABNORMAL LOW (ref >59)

## 2021-04-02 LAB — T4: T4, Total: 9.8 ug/dL (ref 4.5–12.0)

## 2021-04-02 LAB — TSH: TSH: 2.92 u[IU]/mL (ref 0.450–4.500)

## 2021-04-02 LAB — T3, FREE: T3, Free: 1.8 pg/mL — ABNORMAL LOW (ref 2.0–4.4)

## 2021-04-02 LAB — VITAMIN B12: Vitamin B-12: 1046 pg/mL (ref 232–1245)

## 2021-04-03 ENCOUNTER — Ambulatory Visit (INDEPENDENT_AMBULATORY_CARE_PROVIDER_SITE_OTHER): Payer: Medicare Other

## 2021-04-03 ENCOUNTER — Other Ambulatory Visit: Payer: Self-pay

## 2021-04-03 DIAGNOSIS — J4520 Mild intermittent asthma, uncomplicated: Secondary | ICD-10-CM

## 2021-04-03 NOTE — Progress Notes (Signed)
Pt came into the office for a qualifying walk for oxygen based on results from walk test that was done at last OV. During the walk test, pt did not have the qualifying sats to get her to where she qualified for oxygen; lowest O2 sat was 95% on RA.  Stated to pt that what seems to have happened when she was seen by Dr. Valeta Harms on 6/16 was that the O2 sats were accidentally documented as the heart rate and the heart rate was accidentally documented as the oxygen level.  Called and spoke with Estill Bamberg from [place] stating to her to cancel the order that had been placed for O2 as pt does not qualify for it nor does she want it and Estill Bamberg verbalized understanding. Routing this to Dr. Valeta Harms as an [place] so he is made aware of what happened during this walk test.

## 2021-04-07 DIAGNOSIS — M25661 Stiffness of right knee, not elsewhere classified: Secondary | ICD-10-CM | POA: Diagnosis not present

## 2021-04-07 DIAGNOSIS — R269 Unspecified abnormalities of gait and mobility: Secondary | ICD-10-CM | POA: Diagnosis not present

## 2021-04-07 DIAGNOSIS — R296 Repeated falls: Secondary | ICD-10-CM | POA: Diagnosis not present

## 2021-04-07 DIAGNOSIS — M6259 Muscle wasting and atrophy, not elsewhere classified, multiple sites: Secondary | ICD-10-CM | POA: Diagnosis not present

## 2021-04-09 DIAGNOSIS — M6259 Muscle wasting and atrophy, not elsewhere classified, multiple sites: Secondary | ICD-10-CM | POA: Diagnosis not present

## 2021-04-09 DIAGNOSIS — M25661 Stiffness of right knee, not elsewhere classified: Secondary | ICD-10-CM | POA: Diagnosis not present

## 2021-04-09 DIAGNOSIS — R269 Unspecified abnormalities of gait and mobility: Secondary | ICD-10-CM | POA: Diagnosis not present

## 2021-04-09 DIAGNOSIS — R296 Repeated falls: Secondary | ICD-10-CM | POA: Diagnosis not present

## 2021-04-14 DIAGNOSIS — R296 Repeated falls: Secondary | ICD-10-CM | POA: Diagnosis not present

## 2021-04-14 DIAGNOSIS — M25661 Stiffness of right knee, not elsewhere classified: Secondary | ICD-10-CM | POA: Diagnosis not present

## 2021-04-14 DIAGNOSIS — M6259 Muscle wasting and atrophy, not elsewhere classified, multiple sites: Secondary | ICD-10-CM | POA: Diagnosis not present

## 2021-04-14 DIAGNOSIS — R269 Unspecified abnormalities of gait and mobility: Secondary | ICD-10-CM | POA: Diagnosis not present

## 2021-04-15 ENCOUNTER — Ambulatory Visit (INDEPENDENT_AMBULATORY_CARE_PROVIDER_SITE_OTHER): Payer: Medicare Other

## 2021-04-15 ENCOUNTER — Telehealth: Payer: Medicare Other

## 2021-04-15 DIAGNOSIS — N1832 Chronic kidney disease, stage 3b: Secondary | ICD-10-CM

## 2021-04-15 DIAGNOSIS — I10 Essential (primary) hypertension: Secondary | ICD-10-CM

## 2021-04-15 DIAGNOSIS — E538 Deficiency of other specified B group vitamins: Secondary | ICD-10-CM

## 2021-04-15 DIAGNOSIS — R634 Abnormal weight loss: Secondary | ICD-10-CM

## 2021-04-15 DIAGNOSIS — E46 Unspecified protein-calorie malnutrition: Secondary | ICD-10-CM

## 2021-04-16 DIAGNOSIS — M25661 Stiffness of right knee, not elsewhere classified: Secondary | ICD-10-CM | POA: Diagnosis not present

## 2021-04-16 DIAGNOSIS — R296 Repeated falls: Secondary | ICD-10-CM | POA: Diagnosis not present

## 2021-04-16 DIAGNOSIS — M6259 Muscle wasting and atrophy, not elsewhere classified, multiple sites: Secondary | ICD-10-CM | POA: Diagnosis not present

## 2021-04-16 DIAGNOSIS — R269 Unspecified abnormalities of gait and mobility: Secondary | ICD-10-CM | POA: Diagnosis not present

## 2021-04-21 ENCOUNTER — Ambulatory Visit: Payer: Medicare Other

## 2021-04-21 DIAGNOSIS — R269 Unspecified abnormalities of gait and mobility: Secondary | ICD-10-CM | POA: Diagnosis not present

## 2021-04-21 DIAGNOSIS — E46 Unspecified protein-calorie malnutrition: Secondary | ICD-10-CM

## 2021-04-21 DIAGNOSIS — R630 Anorexia: Secondary | ICD-10-CM

## 2021-04-21 DIAGNOSIS — R7303 Prediabetes: Secondary | ICD-10-CM

## 2021-04-21 DIAGNOSIS — I10 Essential (primary) hypertension: Secondary | ICD-10-CM

## 2021-04-21 DIAGNOSIS — M25661 Stiffness of right knee, not elsewhere classified: Secondary | ICD-10-CM | POA: Diagnosis not present

## 2021-04-21 DIAGNOSIS — R296 Repeated falls: Secondary | ICD-10-CM | POA: Diagnosis not present

## 2021-04-21 DIAGNOSIS — M6259 Muscle wasting and atrophy, not elsewhere classified, multiple sites: Secondary | ICD-10-CM | POA: Diagnosis not present

## 2021-04-21 NOTE — Chronic Care Management (AMB) (Signed)
Chronic Care Management    Social Work Note  04/21/2021 Name: Leslie Reeves MRN: [id number] DOB: 1941/09/25  Leslie Reeves is a 80 y.o. year old female who is a primary care patient of [name], [place]. The CCM team was consulted to assist the patient with chronic disease management and/or care coordination needs related to: [employer] .   Collaboration with RN Care Manager  for  case discussion  in response to provider referral for social work chronic care management and care coordination services.   Consent to Services:  The patient was given information about Chronic Care Management services, agreed to services, and gave verbal consent prior to initiation of services.  Please see initial visit note for detailed documentation.   Patient agreed to services and consent obtained.   Assessment: Review of patient past medical history, allergies, medications, and health status, including review of relevant consultants reports was performed today as part of a comprehensive evaluation and provision of chronic care management and care coordination services.     SDOH (Social Determinants of Health) assessments and interventions performed:    Advanced Directives Status: See Care Plan for related entries.  CCM Care Plan  Allergies  Allergen Reactions   Penicillins Rash    Outpatient Encounter Medications as of 04/21/2021  Medication Sig   Accu-Chek Softclix Lancets lancets Use as instructed   albuterol (VENTOLIN HFA) 108 (90 Base) MCG/ACT inhaler INHALE 2 PUFFS BY MOUTH 4 TIMES A DAY   amLODipine (NORVASC) 10 MG tablet TAKE 1 TABLET BY MOUTH EVERY DAY   aspirin (BAYER ASPIRIN) 325 MG tablet Take 1 tablet (325 mg total) by mouth daily.   blood glucose meter kit and supplies KIT Dispense based on patient and insurance preference. Use up to four times daily as directed. (FOR ICD-9 250.00, 250.01).   diclofenac sodium (VOLTAREN) 1 % GEL Apply 2 g topically 4 (four) times daily.    fluticasone-salmeterol (WIXELA INHUB) 100-50 MCG/ACT AEPB TAKE 1 PUFF BY MOUTH EVERY 12 HOURS IN THE MORNING AND IN THE EVENING   glucose blood (ACCU-CHEK AVIVA PLUS) test strip Use as instructed   levothyroxine (SYNTHROID) 25 MCG tablet TAKE 1 AND 1/2 TABLETS BY MOUTH DAILY   megestrol (MEGACE ES) 625 MG/5ML suspension Take 5 mLs (625 mg total) by mouth daily.   metFORMIN (GLUCOPHAGE) 500 MG tablet TAKE 1/2 TABLET BY MOUTH 2 TIMES DAILY   mirtazapine (REMERON) 15 MG tablet TAKE 1 TABLET BY MOUTH EVERYDAY AT BEDTIME   olmesartan (BENICAR) 20 MG tablet Take 1 tablet (20 mg total) by mouth daily.   rosuvastatin (CRESTOR) 20 MG tablet TAKE 1 TABLET BY MOUTH EVERY DAY (Patient not taking: Reported on 04/01/2021)   Vitamin D, Ergocalciferol, (DRISDOL) 1.25 MG (50000 UNIT) CAPS capsule TAKE ONE CAPSULE BY MOUTH TWICE A WEEK   No facility-administered encounter medications on file as of 04/21/2021.    Patient Active Problem List   Diagnosis Date Noted   Solitary pulmonary nodule 05/05/2020   Mild intermittent asthma 05/05/2020   Decreased appetite 02/05/2019   Prediabetes 02/05/2019   Essential hypertension 02/05/2019   Right thyroid nodule 10/09/2018    Conditions to be addressed/monitored:  HTN, Prediabetes, Decreased Appetite, Abnormal Weight loss ;  Advance Directive Education  Care Plan : Social Work [place]  Updates made by [name] since 04/21/2021 12:00 AM     Problem: Quality of Life (General Plan of Care)      Long-Range Goal: Quality of Life Maintained  Start Date: 03/02/2021  Expected End Date: 05/31/2021  Priority: Medium  Note:   Current Barriers:  Chronic disease management support and education needs related to  HTN, Decreased Appetite, and Abnormal Weight loss   Limited knowledge of health plan benefit Limited knowledge of the importance of an Advance Directive  Social Worker Clinical Goal(s):  patient will work with SW to identify and address any acute  and/or chronic care coordination needs related to the self health management of  HTN, Decreased Appetite, and Abnormal Weight Loss Patient will work with Consulting civil engineer to develop an individualized plan of care Patient will work with SW to learn more about Advance Directives Patient will work with SW to become more knowledgeable of health plan benefits   SW Interventions:  Inter-disciplinary care team collaboration (see longitudinal plan of care) Collaboration with [name], [place] regarding development and update of comprehensive plan of care as evidenced by provider attestation and co-signature Collaboration with [place] who indicates the patient did receive mailed Advance Directive packet but it was the Spanish version Requested English version be sent to patients home Re-scheduled follow up call over the next month  Patient Goals/Self-Care Activities patient will: With the help of her daughter  -  Review mailed resource information -Engage with Consulting civil engineer during scheduled appointment -Contact SW as needed prior to next scheduled call  Follow Up Plan:  SW will follow up with the patient and her daughter over the next month         Follow Up Plan: SW will follow up with patient by phone over the next month      [name], BSW, CDP Social Worker, Certified Dementia Practitioner [place] [facility] [place] [facility] [phone]

## 2021-04-21 NOTE — Patient Instructions (Signed)
Social Worker Visit Information  Goals we discussed today:   Goals Addressed             This Visit's Progress    Quality of Life Maintained       Timeframe:  Long-Range Goal Priority:  Medium Start Date:  6.6.22                           Expected End Date: 9.4.22                      Next planned outreach: 8.30.22  Patient Goals/Self-Care Activities patient will: With the help of her daughter  - Review mailed resource information -Engage with Consulting civil engineer during scheduled appointment -Contact SW as needed prior to next scheduled call         Materials Provided: Yes: Dietitian  Follow Up Plan: SW will follow up with patient by phone over the next month   [name], BSW, CDP Social Worker, Certified Dementia Practitioner [place] [facility] [place] [facility] [phone]

## 2021-04-22 DIAGNOSIS — M25661 Stiffness of right knee, not elsewhere classified: Secondary | ICD-10-CM | POA: Diagnosis not present

## 2021-04-22 DIAGNOSIS — R269 Unspecified abnormalities of gait and mobility: Secondary | ICD-10-CM | POA: Diagnosis not present

## 2021-04-22 DIAGNOSIS — M6259 Muscle wasting and atrophy, not elsewhere classified, multiple sites: Secondary | ICD-10-CM | POA: Diagnosis not present

## 2021-04-22 DIAGNOSIS — R296 Repeated falls: Secondary | ICD-10-CM | POA: Diagnosis not present

## 2021-04-23 ENCOUNTER — Other Ambulatory Visit: Payer: Self-pay

## 2021-04-23 ENCOUNTER — Encounter: Payer: Self-pay | Admitting: Nurse Practitioner

## 2021-04-23 ENCOUNTER — Ambulatory Visit (INDEPENDENT_AMBULATORY_CARE_PROVIDER_SITE_OTHER): Payer: Medicare Other | Admitting: Nurse Practitioner

## 2021-04-23 VITALS — BP 122/74 | HR 76 | Temp 98.4°F | Ht 61.6 in | Wt 86.6 lb

## 2021-04-23 DIAGNOSIS — E039 Hypothyroidism, unspecified: Secondary | ICD-10-CM | POA: Diagnosis not present

## 2021-04-23 DIAGNOSIS — Z23 Encounter for immunization: Secondary | ICD-10-CM

## 2021-04-23 DIAGNOSIS — I129 Hypertensive chronic kidney disease with stage 1 through stage 4 chronic kidney disease, or unspecified chronic kidney disease: Secondary | ICD-10-CM | POA: Diagnosis not present

## 2021-04-23 DIAGNOSIS — E46 Unspecified protein-calorie malnutrition: Secondary | ICD-10-CM

## 2021-04-23 DIAGNOSIS — N183 Chronic kidney disease, stage 3 unspecified: Secondary | ICD-10-CM | POA: Diagnosis not present

## 2021-04-23 DIAGNOSIS — R7303 Prediabetes: Secondary | ICD-10-CM | POA: Diagnosis not present

## 2021-04-23 MED ORDER — SHINGRIX 50 MCG/0.5ML IM SUSR: 0.5000 mL | Freq: Once | INTRAMUSCULAR | 0 refills | Status: AC

## 2021-04-23 MED ORDER — TETANUS-DIPHTH-ACELL PERTUSSIS 5-2.5-18.5 LF-MCG/0.5 IM SUSP: 0.5000 mL | Freq: Once | INTRAMUSCULAR | 0 refills | Status: AC

## 2021-04-23 NOTE — Patient Instructions (Addendum)
Hypertension, Adult Hypertension is another name for high blood pressure. High blood pressure forces your heart to work harder to pump blood. This can cause problems overtime. There are two numbers in a blood pressure reading. There is a top number (systolic) over a bottom number (diastolic). It is best to have a blood pressure that is below 120/80. Healthy choicescan help lower your blood pressure, or you may need medicine to help lower it. What are the causes? The cause of this condition is not known. Some conditions may be related tohigh blood pressure. What increases the risk? Smoking. Having type 2 diabetes mellitus, high cholesterol, or both. Not getting enough exercise or physical activity. Being overweight. Having too much fat, sugar, calories, or salt (sodium) in your diet. Drinking too much alcohol. Having long-term (chronic) kidney disease. Having a family history of high blood pressure. Age. Risk increases with age. Race. You may be at higher risk if you are African American. Gender. Men are at higher risk than women before age 20. After age 72, women are at higher risk than men. Having obstructive sleep apnea. Stress. What are the signs or symptoms? High blood pressure may not cause symptoms. Very high blood pressure (hypertensive crisis) may cause: Headache. Feelings of worry or nervousness (anxiety). Shortness of breath. Nosebleed. A feeling of being sick to your stomach (nausea). Throwing up (vomiting). Changes in how you see. Very bad chest pain. Seizures. How is this treated? This condition is treated by making healthy lifestyle changes, such as: Eating healthy foods. Exercising more. Drinking less alcohol. Your health care provider may prescribe medicine if lifestyle changes are not enough to get your blood pressure under control, and if: Your top number is above 130. Your bottom number is above 80. Your personal target blood pressure may vary. Follow these  instructions at home: Eating and drinking  If told, follow the DASH eating plan. To follow this plan: Fill one half of your plate at each meal with fruits and vegetables. Fill one fourth of your plate at each meal with whole grains. Whole grains include whole-wheat pasta, brown rice, and whole-grain bread. Eat or drink low-fat dairy products, such as skim milk or low-fat yogurt. Fill one fourth of your plate at each meal with low-fat (lean) proteins. Low-fat proteins include fish, chicken without skin, eggs, beans, and tofu. Avoid fatty meat, cured and processed meat, or chicken with skin. Avoid pre-made or processed food. Eat less than 1,500 mg of salt each day. Do not drink alcohol if: Your doctor tells you not to drink. You are pregnant, may be pregnant, or are planning to become pregnant. If you drink alcohol: Limit how much you use to: 0-1 drink a day for women. 0-2 drinks a day for men. Be aware of how much alcohol is in your drink. In the [place] one drink equals one 12 oz bottle of beer (355 mL), one 5 oz glass of wine (148 mL), or one 1 oz glass of hard liquor (44 mL).  Lifestyle  Work with your doctor to stay at a healthy weight or to lose weight. Ask your doctor what the best weight is for you. Get at least 30 minutes of exercise most days of the week. This may include walking, swimming, or biking. Get at least 30 minutes of exercise that strengthens your muscles (resistance exercise) at least 3 days a week. This may include lifting weights or doing Pilates. Do not use any products that contain nicotine or tobacco, such as cigarettes,  e-cigarettes, and chewing tobacco. If you need help quitting, ask your doctor. Check your blood pressure at home as told by your doctor. Keep all follow-up visits as told by your doctor. This is important.  Medicines Take over-the-counter and prescription medicines only as told by your doctor. Follow directions carefully. Do not skip doses of  blood pressure medicine. The medicine does not work as well if you skip doses. Skipping doses also puts you at risk for problems. Ask your doctor about side effects or reactions to medicines that you should watch for. Contact a doctor if you: Think you are having a reaction to the medicine you are taking. Have headaches that keep coming back (recurring). Feel dizzy. Have swelling in your ankles. Have trouble with your vision. Get help right away if you: Get a very bad headache. Start to feel mixed up (confused). Feel weak or numb. Feel faint. Have very bad pain in your: Chest. Belly (abdomen). Throw up more than once. Have trouble breathing. Summary Hypertension is another name for high blood pressure. High blood pressure forces your heart to work harder to pump blood. For most people, a normal blood pressure is less than 120/80. Making healthy choices can help lower blood pressure. If your blood pressure does not get lower with healthy choices, you may need to take medicine. This information is not intended to replace advice given to you by your health care provider. Make sure you discuss any questions you have with your healthcare provider. Document Revised: 05/24/2018 Document Reviewed: 05/24/2018 Elsevier Patient Education  2022 [place] [employer] for weekly deliveries [phone].

## 2021-04-23 NOTE — Progress Notes (Signed)
I,Zaire Levesque,acting as a Education administrator for [name], FNP.,have documented all relevant documentation on the behalf of [name], FNP,as directed by  [name], FNP while in the presence of [name], [place].   This visit occurred during the SARS-CoV-2 public health emergency.  Safety protocols were in place, including screening questions prior to the visit, additional usage of staff PPE, and extensive cleaning of exam room while observing appropriate contact time as indicated for disinfecting solutions.  Subjective:     Patient ID: Leslie Reeves , female    DOB: 29-Aug-1941 , 80 y.o.   MRN: [id number]   Chief Complaint  Patient presents with   Hypertension    HPI  Pt is here today for a HTN and prediabetes follow up. She is due for her TDAP and shingrix vaccination.  Wt Readings from Last 3 Encounters: 04/23/21 : 86 lb 9.6 oz (39.3 kg) 04/01/21 : 87 lb 4.8 oz (39.6 kg) 04/01/21 : 87 lb 6.4 oz (39.6 kg)  She will drink one protein shake in the morning. She has seen the nutritionist who is working on her diet. Has encouraged her to eat per nutritionist note as below:   Aim to drink about 8 oz of water every hour starting at 11 am. Aim to have Glucerna 2 times a day in the morning and before bed. Aim to eat dinner with 1/2 plate starch/grain + 1/4 plate of fruit or vegetable + 1/4 plate of protein + ice cream as dessert.    Hypertension This is a chronic problem. Pertinent negatives include no anxiety, chest pain, headaches, orthopnea, palpitations, peripheral edema or shortness of breath. There are no associated agents to hypertension. Risk factors for coronary artery disease include sedentary lifestyle. Past treatments include calcium channel blockers. The current treatment provides significant improvement. There are no compliance problems.  Identifiable causes of hypertension include chronic renal disease.    Past Medical History:  Diagnosis Date   Allergy    Diabetes mellitus  without complication (HCC)    Hypertension      Family History  Problem Relation Age of Onset   Heart disease Other    Stroke Other    Hypertension Other    COPD Other    Cancer Other    Asthma Other    Breast cancer Neg Hx      Current Outpatient Medications:    Accu-Chek Softclix Lancets lancets, Use as instructed, Disp: 100 each, Rfl: 12   albuterol (VENTOLIN HFA) 108 (90 Base) MCG/ACT inhaler, INHALE 2 PUFFS BY MOUTH 4 TIMES A DAY, Disp: 20.1 each, Rfl: 3   amLODipine (NORVASC) 10 MG tablet, TAKE 1 TABLET BY MOUTH EVERY DAY, Disp: 90 tablet, Rfl: 1   aspirin (BAYER ASPIRIN) 325 MG tablet, Take 1 tablet (325 mg total) by mouth daily., Disp: 100 tablet, Rfl: 3   blood glucose meter kit and supplies KIT, Dispense based on patient and insurance preference. Use up to four times daily as directed. (FOR ICD-9 250.00, 250.01)., Disp: 1 each, Rfl: 0   diclofenac sodium (VOLTAREN) 1 % GEL, Apply 2 g topically 4 (four) times daily., Disp: 100 g, Rfl: 1   fluticasone-salmeterol (WIXELA INHUB) 100-50 MCG/ACT AEPB, TAKE 1 PUFF BY MOUTH EVERY 12 HOURS IN THE MORNING AND IN THE EVENING, Disp: 180 each, Rfl: 11   glucose blood (ACCU-CHEK AVIVA PLUS) test strip, Use as instructed, Disp: 300 each, Rfl: 3   levothyroxine (SYNTHROID) 25 MCG tablet, TAKE 1 AND 1/2 TABLETS BY MOUTH  DAILY, Disp: 135 tablet, Rfl: 0   megestrol (MEGACE ES) 625 MG/5ML suspension, Take 5 mLs (625 mg total) by mouth daily., Disp: 150 mL, Rfl: 1   metFORMIN (GLUCOPHAGE) 500 MG tablet, TAKE 1/2 TABLET BY MOUTH 2 TIMES DAILY, Disp: 90 tablet, Rfl: 0   mirtazapine (REMERON) 15 MG tablet, TAKE 1 TABLET BY MOUTH EVERYDAY AT BEDTIME, Disp: 90 tablet, Rfl: 1   olmesartan (BENICAR) 20 MG tablet, Take 1 tablet (20 mg total) by mouth daily., Disp: 90 tablet, Rfl: 1   Tdap (BOOSTRIX) 5-2.5-18.5 LF-MCG/0.5 injection, Inject 0.5 mLs into the muscle once for 1 dose., Disp: 0.5 mL, Rfl: 0   Vitamin D, Ergocalciferol, (DRISDOL) 1.25 MG (50000  UNIT) CAPS capsule, TAKE ONE CAPSULE BY MOUTH TWICE A WEEK, Disp: , Rfl:    Zoster Vaccine Adjuvanted (SHINGRIX) injection, Inject 0.5 mLs into the muscle once for 1 dose., Disp: 0.5 mL, Rfl: 0   rosuvastatin (CRESTOR) 20 MG tablet, TAKE 1 TABLET BY MOUTH EVERY DAY (Patient not taking: No sig reported), Disp: 90 tablet, Rfl: 0   Allergies  Allergen Reactions   Penicillins Rash     Review of Systems  Respiratory:  Negative for shortness of breath.   Cardiovascular:  Negative for chest pain, palpitations, orthopnea and leg swelling.  Neurological:  Negative for dizziness and headaches.    Today's Vitals   04/23/21 0909  BP: 122/74  Pulse: 76  Temp: 98.4 F (36.9 C)  TempSrc: Oral  Weight: 86 lb 9.6 oz (39.3 kg)  Height: 5' 1.6" (1.565 m)  PainSc: 0-No pain   Body mass index is 16.05 kg/m.   Objective:  Physical Exam Vitals reviewed.  Constitutional:      General: She is not in acute distress.    Appearance: Normal appearance.     Comments: Extremely thin  Cardiovascular:     Rate and Rhythm: Normal rate and regular rhythm.     Pulses: Normal pulses.     Heart sounds: Normal heart sounds. No murmur heard. Pulmonary:     Effort: Pulmonary effort is normal. No respiratory distress.     Breath sounds: Normal breath sounds. No wheezing.  Skin:    Capillary Refill: Capillary refill takes less than 2 seconds.  Neurological:     General: No focal deficit present.     Mental Status: She is alert and oriented to person, place, and time.     Cranial Nerves: No cranial nerve deficit.  Psychiatric:        Mood and Affect: Mood normal.        Behavior: Behavior normal.        Thought Content: Thought content normal.        Judgment: Judgment normal.        Assessment And Plan:     1. Benign hypertension with chronic kidney disease, stage III (HCC) Comments: Stable, excellent control Continue current medications  2. Prediabetes Comments: Stable, will check HgbA1c -  Hemoglobin A1c  3. Acquired hypothyroidism Comments: Will recheck thyroid levels and make changes to medications as necessary - T3, free - T4 - TSH  4. Malnutrition, unspecified type ([place]) Comments: Continue follow up with Nutritionist and made her aware to aim to drink 2 glucerna a day Phone number given for Meals on Wheels  5. Encounter for immunization Comments: Sent Rx for Tdap and Shingrix again She did get her Pneumonia 20 in June 2022 - Tdap ([place]) 5-2.5-18.5 LF-MCG/0.5 injection; Inject 0.5 mLs into the muscle once  for 1 dose.  Dispense: 0.5 mL; Refill: 0 - Zoster Vaccine Adjuvanted [facility]) injection; Inject 0.5 mLs into the muscle once for 1 dose.  Dispense: 0.5 mL; Refill: 0   Discussed results of labs done several weeks ago  Patient was given opportunity to ask questions. Patient verbalized understanding of the plan and was able to repeat key elements of the plan. All questions were answered to their satisfaction.  [name], FNP   I, [name], FNP, have reviewed all documentation for this visit. The documentation on 04/23/21 for the exam, diagnosis, procedures, and orders are all accurate and complete.   IF YOU HAVE BEEN REFERRED TO A SPECIALIST, IT MAY TAKE 1-2 WEEKS TO SCHEDULE/PROCESS THE REFERRAL. IF YOU HAVE NOT HEARD FROM US/SPECIALIST IN TWO WEEKS, PLEASE GIVE [place] A CALL AT [phone].   THE PATIENT IS ENCOURAGED TO PRACTICE SOCIAL DISTANCING DUE TO THE COVID-19 PANDEMIC.

## 2021-04-23 NOTE — Patient Instructions (Signed)
Patient Care Plan: Chronic Kidney (Adult)     Problem Identified: Disease Progression   Priority: High     Long-Range Goal: Disease Progression Prevented or Minimized   Start Date: 04/15/2021  Expected End Date: 04/15/2022  This Visit's Progress: On track  Priority: High  Note:   Current Barriers:  Ineffective Self Health Maintenance in a patient with Essential hypertension, Decreased appetite, Abnormal weight loss, Malnutrition, B12 Deficiency, Stage 3b chronic kidney disease Memory loss  Clinical Goal(s):  Collaboration with [name], FNP regarding development and update of comprehensive plan of care as evidenced by provider attestation and co-signature Inter-disciplinary care team collaboration (see longitudinal plan of care) patient will work with care management team to address care coordination and chronic disease management needs related to Disease Management Educational Needs Care Coordination Medication Management and Education Medication Reconciliation Psychosocial Support Caregiver Stress support   Interventions:  04/15/21 completed successful outbound call with patient's daughter Leslie Reeves  Evaluation of current treatment plan related to CKD Stage III , self-management and patient's adherence to plan as established by provider. Collaboration with [name], FNP regarding development and update of comprehensive plan of care as evidenced by provider attestation       and co-signature Inter-disciplinary care team collaboration (see longitudinal plan of care) Provided education to patient about basic disease process for Chronic Kidney disease  Review of patient status, including review of consultant's reports, relevant laboratory and other test results, and medications completed. Reviewed medications with patient and discussed importance of medication adherence Educated on importance of increasing water intake to 64 oz daily unless otherwise directed Mailed printed  educational materials related to Eating Right with Chronic Kidney disease; Stages of Chronic Kidney disease  Discussed plans with patient for ongoing care management follow up and provided patient with direct contact information for care management team Self Care Activities:  Continue to adhere to MD recommendations for CKD  Continue to keep all scheduled follow up appointments Take medications as directed  Let your healthcare team know if you are unable to take your medications Call your pharmacy for refills at least 7 days prior to running out of medication Increase your water intake unless otherwise directed Review mailed printed educational materials related to kidney disease Patient Goals: -Disease Progression Prevented or Minimized  Follow Up Plan: Telephone follow up appointment with care management team member scheduled for: 07/15/21     Patient Care Plan: Abnormal Weight loss     Problem Identified: Abnormal Weight Loss   Priority: High     Long-Range Goal: Abnormal Weight Loss - complications prevented or minimized   Start Date: 04/15/2021  Expected End Date: 04/15/2022  This Visit's Progress: On track  Priority: High  Note:   Current Barriers:  Ineffective Self Health Maintenance in a patient with Essential hypertension, Decreased appetite, Abnormal weight loss, Malnutrition, B12 Deficiency, Stage 3b chronic kidney disease Memory loss  Clinical Goal(s):  Collaboration with [name], FNP regarding development and update of comprehensive plan of care as evidenced by provider attestation and co-signature Inter-disciplinary care team collaboration (see longitudinal plan of care) patient will work with care management team to address care coordination and chronic disease management needs related to Disease Management Educational Needs Care Coordination Medication Management and Education Medication Reconciliation Psychosocial Support Caregiver Stress support    Interventions:  04/15/21 completed successful outbound call with patient's daughter Leslie Reeves  Evaluation of current treatment plan related to  Abnormal Weight loss  ,  self-management and patient's adherence to plan  as established by provider. Collaboration with [name], FNP regarding development and update of comprehensive plan of care as evidenced by provider attestation       and co-signature Inter-disciplinary care team collaboration (see longitudinal plan of care) Determined patient has experienced a decrease in her appetite resulting in weight loss  Wt Readings from Last 3 Encounters: 04/01/21 : 87 lb 4.8 oz (39.6 kg) 04/01/21 : 87 lb 6.4 oz (39.6 kg) 03/24/21 : 89 lb 3.2 oz (40.5 kg) Educated on dietary recommendations for patient's geriatric age  Encouraged daughter to continue to monitor patient's daily weights and record  Educated on when to notify the CCM team and or PCP for persistent change in appetite and weight loss Mailed printed educational materials Elderly Nutrition 101 Discussed plans with patient for ongoing care management follow up and provided patient with direct contact information for care management team Self Care Activities:  Self administers medications as prescribed Attends all scheduled provider appointments Calls pharmacy for medication refills Calls provider office for new concerns or questions Patient Goals: - Abnormal weight loss - complications prevented or minimized   Follow Up Plan: Telephone follow up appointment with care management team member scheduled for: 07/15/21     Goals Addressed      Abnormal Weight loss complications prevented or minimized   On track    Timeframe:  Long-Range Goal Priority:  High Start Date:  04/15/21                           Expected End Date: 04/15/22    Next Follow up date: 07/15/21            Self Care Activities:  Self administers medications as prescribed Attends all scheduled provider  appointments Calls pharmacy for medication refills Calls provider office for new concerns or questions Patient Goals: - Abnormal weight loss - complications prevented or minimized               COMPLETED: Assist with Chronic Care Management and Care Coordination needs       Timeframe:  Short-Term Goal Priority:  High Start Date:  03/02/21                           Expected End Date: 04/24/21   Initial RN CM Outreach scheduled for 04/15/21           Patient Goals/Self-Care Activities patient will:   - Patient will work with the CCM team to address chronic care management and care coordination needs and will continue to work with the clinical team to address health care and disease management related needs.    Follow Up Plan: The care management team will reach out to the patient again over the next 30 days.                   Follow My Treatment Plan-Chronic Kidney   On track    Timeframe:  Long-Range Goal Priority:  High Start Date:  04/15/21                           Expected End Date:  04/15/22                     Follow Up Date 07/15/21    Self Care Activities:  Continue to adhere to MD recommendations for CKD  Continue to keep  all scheduled follow up appointments Take medications as directed  Let your healthcare team know if you are unable to take your medications Call your pharmacy for refills at least 7 days prior to running out of medication Increase your water intake unless otherwise directed Review mailed printed educational materials related to kidney disease Patient Goals: -Disease Progression Prevented or Minimized   Why is this important?   Staying as healthy as you can is very important. This may mean making changes if you smoke, don't exercise or eat poorly.  A healthy lifestyle is an important goal for you.  Following the treatment plan and making changes may be hard.  Try some of these steps to help keep the disease from getting worse.     Notes:

## 2021-04-23 NOTE — Chronic Care Management (AMB) (Signed)
Chronic Care Management   CCM RN Visit Note  04/15/2021 Name: Leslie Reeves MRN: [id number] DOB: August 20, 1941  Subjective: Leslie Reeves is a 80 y.o. year old female who is a primary care patient of [name], [place]. The care management team was consulted for assistance with disease management and care coordination needs.    Engaged with patient by telephone for follow up visit in response to provider referral for case management and/or care coordination services.   Consent to Services:  The patient was given information about Chronic Care Management services, agreed to services, and gave verbal consent prior to initiation of services.  Please see initial visit note for detailed documentation.   Patient agreed to services and verbal consent obtained.   Assessment: Review of patient past medical history, allergies, medications, health status, including review of consultants reports, laboratory and other test data, was performed as part of comprehensive evaluation and provision of chronic care management services.   SDOH (Social Determinants of Health) assessments and interventions performed:  Yes, no acute needs   CCM Care Plan  Allergies  Allergen Reactions   Penicillins Rash    Outpatient Encounter Medications as of 04/15/2021  Medication Sig   Accu-Chek Softclix Lancets lancets Use as instructed   albuterol (VENTOLIN HFA) 108 (90 Base) MCG/ACT inhaler INHALE 2 PUFFS BY MOUTH 4 TIMES A DAY   amLODipine (NORVASC) 10 MG tablet TAKE 1 TABLET BY MOUTH EVERY DAY   aspirin (BAYER ASPIRIN) 325 MG tablet Take 1 tablet (325 mg total) by mouth daily.   blood glucose meter kit and supplies KIT Dispense based on patient and insurance preference. Use up to four times daily as directed. (FOR ICD-9 250.00, 250.01).   diclofenac sodium (VOLTAREN) 1 % GEL Apply 2 g topically 4 (four) times daily.   fluticasone-salmeterol (WIXELA INHUB) 100-50 MCG/ACT AEPB TAKE 1 PUFF BY MOUTH EVERY 12 HOURS IN THE  MORNING AND IN THE EVENING   glucose blood (ACCU-CHEK AVIVA PLUS) test strip Use as instructed   levothyroxine (SYNTHROID) 25 MCG tablet TAKE 1 AND 1/2 TABLETS BY MOUTH DAILY   megestrol (MEGACE ES) 625 MG/5ML suspension Take 5 mLs (625 mg total) by mouth daily.   metFORMIN (GLUCOPHAGE) 500 MG tablet TAKE 1/2 TABLET BY MOUTH 2 TIMES DAILY   mirtazapine (REMERON) 15 MG tablet TAKE 1 TABLET BY MOUTH EVERYDAY AT BEDTIME   olmesartan (BENICAR) 20 MG tablet Take 1 tablet (20 mg total) by mouth daily.   rosuvastatin (CRESTOR) 20 MG tablet TAKE 1 TABLET BY MOUTH EVERY DAY (Patient not taking: No sig reported)   Vitamin D, Ergocalciferol, (DRISDOL) 1.25 MG (50000 UNIT) CAPS capsule TAKE ONE CAPSULE BY MOUTH TWICE A WEEK   No facility-administered encounter medications on file as of 04/15/2021.    Patient Active Problem List   Diagnosis Date Noted   Solitary pulmonary nodule 05/05/2020   Mild intermittent asthma 05/05/2020   Decreased appetite 02/05/2019   Prediabetes 02/05/2019   Essential hypertension 02/05/2019   Right thyroid nodule 10/09/2018    Conditions to be addressed/monitored: Essential hypertension, Decreased appetite, Abnormal weight loss, Malnutrition, B12 Deficiency, Stage 3b chronic kidney disease  Care Plan : Chronic Kidney (Adult)  Updates made by Lynne Logan, RN since 04/15/2021 12:00 AM     Problem: Disease Progression   Priority: High     Long-Range Goal: Disease Progression Prevented or Minimized   Start Date: 04/15/2021  Expected End Date: 04/15/2022  This Visit's Progress: On track  Priority: High  Note:   Current Barriers:  Ineffective Self Health Maintenance in a patient with Essential hypertension, Decreased appetite, Abnormal weight loss, Malnutrition, B12 Deficiency, Stage 3b chronic kidney disease Memory loss  Clinical Goal(s):  Collaboration with [name], FNP regarding development and update of comprehensive plan of care as evidenced by provider  attestation and co-signature Inter-disciplinary care team collaboration (see longitudinal plan of care) patient will work with care management team to address care coordination and chronic disease management needs related to Disease Management Educational Needs Care Coordination Medication Management and Education Medication Reconciliation Psychosocial Support Caregiver Stress support   Interventions:  04/15/21 completed successful outbound call with patient's daughter [name]  Evaluation of current treatment plan related to CKD Stage III , self-management and patient's adherence to plan as established by provider. Collaboration with [name], FNP regarding development and update of comprehensive plan of care as evidenced by provider attestation       and co-signature Inter-disciplinary care team collaboration (see longitudinal plan of care) Provided education to patient about basic disease process for Chronic Kidney disease  Review of patient status, including review of consultant's reports, relevant laboratory and other test results, and medications completed. Reviewed medications with patient and discussed importance of medication adherence Educated on importance of increasing water intake to 64 oz daily unless otherwise directed Mailed printed educational materials related to Eating Right with Chronic Kidney disease; Stages of Chronic Kidney disease  Discussed plans with patient for ongoing care management follow up and provided patient with direct contact information for care management team Self Care Activities:  Continue to adhere to MD recommendations for CKD  Continue to keep all scheduled follow up appointments Take medications as directed  Let your healthcare team know if you are unable to take your medications Call your pharmacy for refills at least 7 days prior to running out of medication Increase your water intake unless otherwise directed Review mailed printed  educational materials related to kidney disease Patient Goals: -Disease Progression Prevented or Minimized  Follow Up Plan: Telephone follow up appointment with care management team member scheduled for: 07/15/21      Care Plan : Abnormal Weight loss  Updates made by Lynne Logan, RN since 04/15/2021 12:00 AM     Problem: Abnormal Weight Loss   Priority: High     Long-Range Goal: Abnormal Weight Loss - complications prevented or minimized   Start Date: 04/15/2021  Expected End Date: 04/15/2022  This Visit's Progress: On track  Priority: High  Note:   Current Barriers:  Ineffective Self Health Maintenance in a patient with Essential hypertension, Decreased appetite, Abnormal weight loss, Malnutrition, B12 Deficiency, Stage 3b chronic kidney disease Memory loss  Clinical Goal(s):  Collaboration with [name], FNP regarding development and update of comprehensive plan of care as evidenced by provider attestation and co-signature Inter-disciplinary care team collaboration (see longitudinal plan of care) patient will work with care management team to address care coordination and chronic disease management needs related to Disease Management Educational Needs Care Coordination Medication Management and Education Medication Reconciliation Psychosocial Support Caregiver Stress support   Interventions:  04/15/21 completed successful outbound call with patient's daughter [name]  Evaluation of current treatment plan related to  Abnormal Weight loss  ,  self-management and patient's adherence to plan as established by provider. Collaboration with [name], FNP regarding development and update of comprehensive plan of care as evidenced by provider attestation       and co-signature Inter-disciplinary care team collaboration (see longitudinal  plan of care) Determined patient has experienced a decrease in her appetite resulting in weight loss  Wt Readings from Last 3  Encounters: 04/01/21 : 87 lb 4.8 oz (39.6 kg) 04/01/21 : 87 lb 6.4 oz (39.6 kg) 03/24/21 : 89 lb 3.2 oz (40.5 kg) Educated on dietary recommendations for patient's geriatric age  Encouraged daughter to continue to monitor patient's daily weights and record  Educated on when to notify the CCM team and or PCP for persistent change in appetite and weight loss Mailed printed educational materials Elderly Nutrition 101 Discussed plans with patient for ongoing care management follow up and provided patient with direct contact information for care management team Self Care Activities:  Self administers medications as prescribed Attends all scheduled provider appointments Calls pharmacy for medication refills Calls provider office for new concerns or questions Patient Goals: - Abnormal weight loss - complications prevented or minimized   Follow Up Plan: Telephone follow up appointment with care management team member scheduled for: 07/15/21    Plan:Telephone follow up appointment with care management team member scheduled for:  07/15/21  Barb Merino, RN, BSN, CCM Care Management Coordinator [place] [facility]  Direct Phone: [phone]

## 2021-04-24 ENCOUNTER — Telehealth: Payer: Medicare Other

## 2021-04-24 LAB — HEMOGLOBIN A1C
Est. average glucose Bld gHb Est-mCnc: 100 mg/dL
Hgb A1c MFr Bld: 5.1 % (ref 4.8–5.6)

## 2021-04-24 LAB — T4: T4, Total: 11.2 ug/dL (ref 4.5–12.0)

## 2021-04-24 LAB — TSH: TSH: 2.22 u[IU]/mL (ref 0.450–4.500)

## 2021-04-24 LAB — T3, FREE: T3, Free: 1.7 pg/mL — ABNORMAL LOW (ref 2.0–4.4)

## 2021-05-06 ENCOUNTER — Encounter: Payer: Medicare Other | Attending: Nurse Practitioner | Admitting: Registered"

## 2021-05-06 DIAGNOSIS — E441 Mild protein-calorie malnutrition: Secondary | ICD-10-CM | POA: Insufficient documentation

## 2021-05-26 ENCOUNTER — Telehealth: Payer: Self-pay

## 2021-05-26 ENCOUNTER — Telehealth: Payer: Medicare Other

## 2021-05-26 NOTE — Telephone Encounter (Signed)
  Care Management   Follow Up Note   05/26/2021 Name: Leslie Reeves MRN: [id number] DOB: 11-22-1940   Referred by: [name], FNP Reason for referral : Chronic Care Management (Unsuccessful call)   An unsuccessful telephone outreach was attempted today. The patient was referred to the case management team for assistance with care management and care coordination.   Follow Up Plan: The care management team will reach out to the patient again over the next 21 days.   [name], BSW, CDP Social Worker, Certified Dementia Practitioner [place] [facility] [place] [facility] [phone]

## 2021-06-05 ENCOUNTER — Other Ambulatory Visit: Payer: Self-pay | Admitting: Nurse Practitioner

## 2021-06-08 ENCOUNTER — Ambulatory Visit: Payer: Medicare Other | Admitting: Neurology

## 2021-06-10 ENCOUNTER — Ambulatory Visit (INDEPENDENT_AMBULATORY_CARE_PROVIDER_SITE_OTHER): Payer: Medicare Other

## 2021-06-10 DIAGNOSIS — E46 Unspecified protein-calorie malnutrition: Secondary | ICD-10-CM

## 2021-06-10 DIAGNOSIS — I129 Hypertensive chronic kidney disease with stage 1 through stage 4 chronic kidney disease, or unspecified chronic kidney disease: Secondary | ICD-10-CM

## 2021-06-10 DIAGNOSIS — R7303 Prediabetes: Secondary | ICD-10-CM

## 2021-06-10 NOTE — Chronic Care Management (AMB) (Signed)
Chronic Care Management    Social Work Note  06/10/2021 Name: Leslie Reeves MRN: [id number] DOB: 04-23-1941  Leslie Reeves is a 80 y.o. year old female who is a primary care patient of [name], [place]. The CCM team was consulted to assist the patient with chronic disease management and/or care coordination needs related to:  HTN, prediabetes, decreased appetite, and increased forgetfulness .   Engaged with patients daughter Leslie Reeves by phone  for follow up visit in response to provider referral for social work chronic care management and care coordination services.   Consent to Services:  The patient was given information about Chronic Care Management services, agreed to services, and gave verbal consent prior to initiation of services.  Please see initial visit note for detailed documentation.   Patient agreed to services and consent obtained.   Assessment: Review of patient past medical history, allergies, medications, and health status, including review of relevant consultants reports was performed today as part of a comprehensive evaluation and provision of chronic care management and care coordination services.     SDOH (Social Determinants of Health) assessments and interventions performed:    Advanced Directives Status: Not addressed in this encounter.  CCM Care Plan  Allergies  Allergen Reactions   Penicillins Rash    Outpatient Encounter Medications as of 06/10/2021  Medication Sig   Accu-Chek Softclix Lancets lancets Use as instructed   albuterol (VENTOLIN HFA) 108 (90 Base) MCG/ACT inhaler INHALE 2 PUFFS BY MOUTH 4 TIMES A DAY   amLODipine (NORVASC) 10 MG tablet TAKE 1 TABLET BY MOUTH EVERY DAY   aspirin (BAYER ASPIRIN) 325 MG tablet Take 1 tablet (325 mg total) by mouth daily.   blood glucose meter kit and supplies KIT Dispense based on patient and insurance preference. Use up to four times daily as directed. (FOR ICD-9 250.00, 250.01).   diclofenac sodium (VOLTAREN)  1 % GEL Apply 2 g topically 4 (four) times daily.   fluticasone-salmeterol (WIXELA INHUB) 100-50 MCG/ACT AEPB TAKE 1 PUFF BY MOUTH EVERY 12 HOURS IN THE MORNING AND IN THE EVENING   glucose blood (ACCU-CHEK AVIVA PLUS) test strip Use as instructed   levothyroxine (SYNTHROID) 25 MCG tablet TAKE 1 AND 1/2 TABLETS BY MOUTH DAILY   megestrol (MEGACE ES) 625 MG/5ML suspension Take 5 mLs (625 mg total) by mouth daily.   metFORMIN (GLUCOPHAGE) 500 MG tablet TAKE 1/2 TABLET BY MOUTH 2 TIMES DAILY   mirtazapine (REMERON) 15 MG tablet TAKE 1 TABLET BY MOUTH EVERYDAY AT BEDTIME   olmesartan (BENICAR) 20 MG tablet Take 1 tablet (20 mg total) by mouth daily.   rosuvastatin (CRESTOR) 20 MG tablet TAKE 1 TABLET BY MOUTH EVERY DAY   Vitamin D, Ergocalciferol, (DRISDOL) 1.25 MG (50000 UNIT) CAPS capsule TAKE ONE CAPSULE BY MOUTH TWICE A WEEK   No facility-administered encounter medications on file as of 06/10/2021.    Patient Active Problem List   Diagnosis Date Noted   Solitary pulmonary nodule 05/05/2020   Mild intermittent asthma 05/05/2020   Decreased appetite 02/05/2019   Prediabetes 02/05/2019   Essential hypertension 02/05/2019   Right thyroid nodule 10/09/2018    Conditions to be addressed/monitored: HTN and prediabetes ; Memory Deficits  Care Plan : Social Work [place]  Updates made by [name] since 06/10/2021 12:00 AM  Completed 06/10/2021   Problem: Barriers to Treatment Resolved 06/10/2021     Goal: Barriers to Treatment Identified and Managed Completed 06/10/2021  Start Date: 03/02/2021  Expected End  Date: 04/16/2021  Recent Progress: On track  Priority: High  Note:   Current Barriers:  Chronic disease management support and education needs related to  HTN, Decreased Appetite, and Abnormal Weight loss   Patient and daughter unsure how to obtain Oxygen Decline in memory leading to gaps in care due to patient forgetfulness  Social Worker Clinical Goal(s):  patient will  work with SW to identify and address any acute and/or chronic care coordination needs related to the self health management of  HTN, Decreased Appetite, and Abnormal Weight loss    Patient will follow up with primary provider regarding need for B12 injections and oxygen as directed by SW  SW Interventions:  Inter-disciplinary care team collaboration (see longitudinal plan of care) Collaboration with [name], [place] regarding development and update of comprehensive plan of care as evidenced by provider attestation and co-signature Successful outbound call placed to the patients daughter Leslie Reeves to assess goal progression Discussed the patient is doing well in the home but seems more tired throughout the day Performed chart review to note patient has upcomming appointment with her primary provider in October - Leslie Reeves is knowledgeable of this appointment date and does plan to accompany the patient Encouraged Leslie Reeves to contact the patients provider if a sooner appointment is needed Patient Goals/Self-Care Activities patient will: with the help of her daughter  -  Attend upcomming appointments -Contact SW as needed       Problem: Quality of Life (General Plan of Care) Resolved 06/10/2021     Long-Range Goal: Quality of Life Maintained Completed 06/10/2021  Start Date: 03/02/2021  Expected End Date: 05/31/2021  Priority: Medium  Note:   Current Barriers:  Chronic disease management support and education needs related to  HTN, Decreased Appetite, and Abnormal Weight loss   Limited knowledge of health plan benefit Limited knowledge of the importance of an Estate agent Clinical Goal(s):  patient will work with SW to identify and address any acute and/or chronic care coordination needs related to the self health management of  HTN, Decreased Appetite, and Abnormal Weight Loss Patient will work with Consulting civil engineer to develop an individualized plan of  care Patient will work with SW to learn more about Advance Directives Patient will work with SW to become more knowledgeable of health plan benefits   SW Interventions:  Inter-disciplinary care team collaboration (see longitudinal plan of care) Collaboration with [name], [place] regarding development and update of comprehensive plan of care as evidenced by provider attestation and co-signature Successful outbound call placed to the patients daughter to confirm receipt of mailed Advance Directive Determined the document was received but has not been completed Advised Leslie Reeves to contact SW if assistance is needed with form completion  Patient Goals/Self-Care Activities patient will: With the help of her daughter -Contact SW as needed          Follow Up Plan:  No SW follow up planned at this time. The patient will remain engaged with RN Care Manager      [name], BSW, CDP Social Worker, Certified Dementia Practitioner [place] [facility] [place] [facility] [phone]

## 2021-06-10 NOTE — Patient Instructions (Signed)
Social Worker Visit Information  Goals we discussed today:   Goals Addressed             This Visit's Progress    COMPLETED: Barriers to Treatment Identified and Managed       Timeframe:  Short-Term Goal Priority:  High Start Date:  6.6.22                           Expected End Date: 7.21.22                       Patient Goals/Self-Care Activities patient will: with the help of her daughter  - Attend upcomming appointments -Contact SW as needed      COMPLETED: Quality of Life Maintained       Timeframe:  Long-Range Goal Priority:  Medium Start Date:  6.6.22                           Expected End Date: 9.4.22                      Patient Goals/Self-Care Activities patient will: With the help of her daughter -Contact SW as needed         Follow Up Plan:  No follow up planned at this time. Please contact me as needed.   [name], BSW, CDP Social Worker, Certified Dementia Practitioner [place] [facility] [place] [facility] [phone]

## 2021-06-11 ENCOUNTER — Telehealth: Payer: Self-pay

## 2021-06-11 ENCOUNTER — Encounter (HOSPITAL_COMMUNITY): Payer: Self-pay | Admitting: Emergency Medicine

## 2021-06-11 ENCOUNTER — Inpatient Hospital Stay (HOSPITAL_COMMUNITY)
Admission: EM | Admit: 2021-06-11 | Discharge: 2021-06-13 | DRG: 291 | Disposition: A | Discharge status: Home / Self Care | Payer: Medicare Other | Attending: Internal Medicine | Admitting: Internal Medicine

## 2021-06-11 ENCOUNTER — Emergency Department (HOSPITAL_COMMUNITY): Payer: Medicare Other

## 2021-06-11 ENCOUNTER — Other Ambulatory Visit: Payer: Self-pay

## 2021-06-11 DIAGNOSIS — Z79899 Other long term (current) drug therapy: Secondary | ICD-10-CM | POA: Diagnosis not present

## 2021-06-11 DIAGNOSIS — E041 Nontoxic single thyroid nodule: Secondary | ICD-10-CM | POA: Diagnosis present

## 2021-06-11 DIAGNOSIS — N1832 Chronic kidney disease, stage 3b: Secondary | ICD-10-CM

## 2021-06-11 DIAGNOSIS — Z7401 Bed confinement status: Secondary | ICD-10-CM

## 2021-06-11 DIAGNOSIS — I5031 Acute diastolic (congestive) heart failure: Secondary | ICD-10-CM | POA: Diagnosis present

## 2021-06-11 DIAGNOSIS — J449 Chronic obstructive pulmonary disease, unspecified: Secondary | ICD-10-CM | POA: Diagnosis not present

## 2021-06-11 DIAGNOSIS — J4520 Mild intermittent asthma, uncomplicated: Secondary | ICD-10-CM | POA: Diagnosis not present

## 2021-06-11 DIAGNOSIS — J90 Pleural effusion, not elsewhere classified: Secondary | ICD-10-CM | POA: Diagnosis not present

## 2021-06-11 DIAGNOSIS — Z79818 Long term (current) use of other agents affecting estrogen receptors and estrogen levels: Secondary | ICD-10-CM

## 2021-06-11 DIAGNOSIS — Z823 Family history of stroke: Secondary | ICD-10-CM

## 2021-06-11 DIAGNOSIS — R0602 Shortness of breath: Secondary | ICD-10-CM | POA: Diagnosis not present

## 2021-06-11 DIAGNOSIS — Z8249 Family history of ischemic heart disease and other diseases of the circulatory system: Secondary | ICD-10-CM

## 2021-06-11 DIAGNOSIS — I130 Hypertensive heart and chronic kidney disease with heart failure and stage 1 through stage 4 chronic kidney disease, or unspecified chronic kidney disease: Secondary | ICD-10-CM | POA: Diagnosis not present

## 2021-06-11 DIAGNOSIS — Z90710 Acquired absence of both cervix and uterus: Secondary | ICD-10-CM

## 2021-06-11 DIAGNOSIS — N1831 Chronic kidney disease, stage 3a: Secondary | ICD-10-CM | POA: Diagnosis present

## 2021-06-11 DIAGNOSIS — D631 Anemia in chronic kidney disease: Secondary | ICD-10-CM | POA: Diagnosis not present

## 2021-06-11 DIAGNOSIS — R627 Adult failure to thrive: Secondary | ICD-10-CM | POA: Diagnosis not present

## 2021-06-11 DIAGNOSIS — R911 Solitary pulmonary nodule: Secondary | ICD-10-CM | POA: Diagnosis present

## 2021-06-11 DIAGNOSIS — Z7982 Long term (current) use of aspirin: Secondary | ICD-10-CM | POA: Diagnosis not present

## 2021-06-11 DIAGNOSIS — I10 Essential (primary) hypertension: Secondary | ICD-10-CM | POA: Diagnosis not present

## 2021-06-11 DIAGNOSIS — I509 Heart failure, unspecified: Secondary | ICD-10-CM

## 2021-06-11 DIAGNOSIS — E1122 Type 2 diabetes mellitus with diabetic chronic kidney disease: Secondary | ICD-10-CM | POA: Diagnosis not present

## 2021-06-11 DIAGNOSIS — N179 Acute kidney failure, unspecified: Secondary | ICD-10-CM | POA: Diagnosis not present

## 2021-06-11 DIAGNOSIS — E039 Hypothyroidism, unspecified: Secondary | ICD-10-CM | POA: Diagnosis not present

## 2021-06-11 DIAGNOSIS — Z87891 Personal history of nicotine dependence: Secondary | ICD-10-CM

## 2021-06-11 DIAGNOSIS — Z20822 Contact with and (suspected) exposure to covid-19: Secondary | ICD-10-CM | POA: Diagnosis not present

## 2021-06-11 DIAGNOSIS — R7303 Prediabetes: Secondary | ICD-10-CM | POA: Diagnosis present

## 2021-06-11 DIAGNOSIS — Z7984 Long term (current) use of oral hypoglycemic drugs: Secondary | ICD-10-CM

## 2021-06-11 DIAGNOSIS — Z79890 Hormone replacement therapy (postmenopausal): Secondary | ICD-10-CM

## 2021-06-11 DIAGNOSIS — I517 Cardiomegaly: Secondary | ICD-10-CM | POA: Diagnosis not present

## 2021-06-11 DIAGNOSIS — I50811 Acute right heart failure: Secondary | ICD-10-CM | POA: Diagnosis not present

## 2021-06-11 DIAGNOSIS — R630 Anorexia: Secondary | ICD-10-CM | POA: Diagnosis present

## 2021-06-11 DIAGNOSIS — J9811 Atelectasis: Secondary | ICD-10-CM | POA: Diagnosis not present

## 2021-06-11 DIAGNOSIS — E119 Type 2 diabetes mellitus without complications: Secondary | ICD-10-CM

## 2021-06-11 DIAGNOSIS — I110 Hypertensive heart disease with heart failure: Secondary | ICD-10-CM | POA: Diagnosis not present

## 2021-06-11 LAB — CBC
HCT: 36.2 % (ref 36.0–46.0)
Hemoglobin: 11.1 g/dL — ABNORMAL LOW (ref 12.0–15.0)
MCH: 25.5 pg — ABNORMAL LOW (ref 26.0–34.0)
MCHC: 30.7 g/dL (ref 30.0–36.0)
MCV: 83.2 fL (ref 80.0–100.0)
Platelets: 286 10*3/uL (ref 150–400)
RBC: 4.35 MIL/uL (ref 3.87–5.11)
RDW: 19.1 % — ABNORMAL HIGH (ref 11.5–15.5)
WBC: 5.4 10*3/uL (ref 4.0–10.5)
nRBC: 0 % (ref 0.0–0.2)

## 2021-06-11 LAB — BASIC METABOLIC PANEL
Anion gap: 6 (ref 5–15)
BUN: 32 mg/dL — ABNORMAL HIGH (ref 8–23)
CO2: 25 mmol/L (ref 22–32)
Calcium: 9.2 mg/dL (ref 8.9–10.3)
Chloride: 113 mmol/L — ABNORMAL HIGH (ref 98–111)
Creatinine, Ser: 1.86 mg/dL — ABNORMAL HIGH (ref 0.44–1.00)
GFR, Estimated: 27 mL/min — ABNORMAL LOW (ref >60)
Glucose, Bld: 93 mg/dL (ref 70–99)
Potassium: 4.3 mmol/L (ref 3.5–5.1)
Sodium: 144 mmol/L (ref 135–145)

## 2021-06-11 LAB — TROPONIN I (HIGH SENSITIVITY): Troponin I (High Sensitivity): 181 ng/L (ref <18)

## 2021-06-11 LAB — BRAIN NATRIURETIC PEPTIDE: B Natriuretic Peptide: 125.8 pg/mL — ABNORMAL HIGH (ref 0.0–100.0)

## 2021-06-11 MED ORDER — LABETALOL HCL 5 MG/ML IV SOLN
20.0000 mg | Freq: Once | INTRAVENOUS | Status: AC
  Administered 2021-06-12: 20 mg via INTRAVENOUS
  Filled 2021-06-11: qty 4

## 2021-06-11 MED ORDER — FUROSEMIDE 10 MG/ML IJ SOLN
20.0000 mg | Freq: Once | INTRAMUSCULAR | Status: AC
  Administered 2021-06-12: 20 mg via INTRAVENOUS
  Filled 2021-06-11: qty 2

## 2021-06-11 NOTE — Telephone Encounter (Signed)
Patients family advised to take pt to the ED for SOB

## 2021-06-11 NOTE — ED Provider Notes (Signed)
Emergency Medicine Provider Triage Evaluation Note  Leslie Reeves , a 80 y.o. female  was evaluated in triage.  Pt complains of sob that started today. Further reports ble swelling that started today.  Review of Systems  Positive: Sob, ble swelling Negative: Chest pain  Physical Exam  BP (!) 166/82 (BP Location: Right Arm)   Pulse 73   Temp 98.8 F (37.1 C) (Oral)   Resp 14   SpO2 92%  Gen:   Awake, no distress   Resp:  Normal effort  MSK:   Moves extremities without difficulty  Other:  Edema noted to the bilat ankles, bibasilar rales present with end expiratory wheeze noted  Medical Decision Making  Medically screening exam initiated at 6:57 PM.  Appropriate orders placed.  Leslie Reeves was informed that the remainder of the evaluation will be completed by another provider, this initial triage assessment does not replace that evaluation, and the importance of remaining in the ED until their evaluation is complete.     Rodney Booze, PA-C 06/11/21 1859    Daleen Bo, MD 06/13/21 1134

## 2021-06-11 NOTE — ED Triage Notes (Signed)
Pt c/o shortness of breath that started this morning. Denies cough/chest pain. Reports swelling to bilateral wrists and ankles.

## 2021-06-11 NOTE — ED Provider Notes (Signed)
[facility] Provider Note   CSN: [id number] Arrival date & time: 06/11/21  1842     History Chief Complaint  Patient presents with   Shortness of Breath    Leslie Reeves is a 80 y.o. female.  Patient has had dyspnea on exertion for about a month.  As a result, she has been basically bedbound.  Her daughter has been attempting to take her mother to have medical attention, but she has been unable to convince her until tonight.  The patient denies other symptoms such as chest pain or cough.  She has been noticing some peripheral edema.  The history is provided by the patient and a relative (daughter).  Shortness of Breath Severity:  Moderate Onset quality:  Gradual Duration:  1 month Timing:  Constant Progression:  Unchanged Chronicity:  New Context comment:  Unknown cause Relieved by:  Nothing Worsened by:  Activity Associated symptoms: no abdominal pain, no chest pain, no cough, no ear pain, no fever, no rash, no sore throat and no vomiting       Past Medical History:  Diagnosis Date   Allergy    Diabetes mellitus without complication ([place])    Hypertension     Patient Active Problem List   Diagnosis Date Noted   Solitary pulmonary nodule 05/05/2020   Mild intermittent asthma 05/05/2020   Decreased appetite 02/05/2019   Prediabetes 02/05/2019   Essential hypertension 02/05/2019   Right thyroid nodule 10/09/2018    Past Surgical History:  Procedure Laterality Date   ABDOMINAL HYSTERECTOMY       OB History   No obstetric history on file.     Family History  Problem Relation Age of Onset   Heart disease Other    Stroke Other    Hypertension Other    COPD Other    Cancer Other    Asthma Other    Breast cancer Neg Hx     Social History   Tobacco Use   Smoking status: Former    Packs/day: 0.50    Years: 15.00    Pack years: 7.50    Types: Cigarettes    Quit date: 2017    Years since quitting: 5.7    Smokeless tobacco: Never  Vaping Use   Vaping Use: Never used  Substance Use Topics   Alcohol use: Not Currently   Drug use: Not Currently    Home Medications Prior to Admission medications   Medication Sig Start Date End Date Taking? Authorizing Provider  Accu-Chek Softclix Lancets lancets Use as instructed 03/04/21   [name], FNP  albuterol (VENTOLIN HFA) 108 (90 Base) MCG/ACT inhaler INHALE 2 PUFFS BY MOUTH 4 TIMES A DAY 03/12/21   Icard, Bradley L, DO  amLODipine (NORVASC) 10 MG tablet TAKE 1 TABLET BY MOUTH EVERY DAY 12/09/20   [name], FNP  aspirin (BAYER ASPIRIN) 325 MG tablet Take 1 tablet (325 mg total) by mouth daily. 03/09/21 03/09/22  [name], FNP  blood glucose meter kit and supplies KIT Dispense based on patient and insurance preference. Use up to four times daily as directed. (FOR ICD-9 250.00, 250.01). 11/23/18   [name], FNP  diclofenac sodium (VOLTAREN) 1 % GEL Apply 2 g topically 4 (four) times daily. 02/15/19   [name], FNP  fluticasone-salmeterol (WIXELA INHUB) 100-50 MCG/ACT AEPB TAKE 1 PUFF BY MOUTH EVERY 12 HOURS IN THE MORNING AND IN THE EVENING 03/12/21   Icard, Bradley L, DO  glucose blood (ACCU-CHEK AVIVA  PLUS) test strip Use as instructed 11/27/18   [name], FNP  levothyroxine (SYNTHROID) 25 MCG tablet TAKE 1 AND 1/2 TABLETS BY MOUTH DAILY 02/27/21   [name], FNP  megestrol (MEGACE ES) 625 MG/5ML suspension Take 5 mLs (625 mg total) by mouth daily. 03/26/20   [name], FNP  metFORMIN (GLUCOPHAGE) 500 MG tablet TAKE 1/2 TABLET BY MOUTH 2 TIMES DAILY 02/27/21   [name], FNP  mirtazapine (REMERON) 15 MG tablet TAKE 1 TABLET BY MOUTH EVERYDAY AT BEDTIME 02/27/21   [name], FNP  olmesartan (BENICAR) 20 MG tablet Take 1 tablet (20 mg total) by mouth daily. 02/17/21   [name], FNP  rosuvastatin (CRESTOR) 20 MG tablet TAKE 1 TABLET BY MOUTH EVERY DAY 06/05/21   [name], FNP  Vitamin D, Ergocalciferol, (DRISDOL) 1.25 MG  (50000 UNIT) CAPS capsule TAKE ONE CAPSULE BY MOUTH TWICE A WEEK 02/11/21   [provider]    Allergies    Penicillin g and Penicillins  Review of Systems   Review of Systems  Constitutional:  Negative for chills and fever.  HENT:  Negative for ear pain and sore throat.   Eyes:  Negative for pain and visual disturbance.  Respiratory:  Positive for shortness of breath. Negative for cough.   Cardiovascular:  Positive for leg swelling. Negative for chest pain and palpitations.  Gastrointestinal:  Negative for abdominal pain and vomiting.  Genitourinary:  Negative for dysuria and hematuria.  Musculoskeletal:  Negative for arthralgias and back pain.  Skin:  Negative for color change and rash.  Neurological:  Negative for seizures and syncope.  All other systems reviewed and are negative.  Physical Exam Updated Vital Signs BP (!) 199/68 (BP Location: Right Arm)   Pulse 68   Temp 97.8 F (36.6 C)   Resp 19   SpO2 96%   Physical Exam Vitals and nursing note reviewed.  Constitutional:      General: She is not in acute distress. HENT:     Head: Normocephalic and atraumatic.     Mouth/Throat:     Mouth: Mucous membranes are moist.  Eyes:     Extraocular Movements: Extraocular movements intact.  Cardiovascular:     Rate and Rhythm: Normal rate and regular rhythm.     Heart sounds: Normal heart sounds.  Pulmonary:     Effort: Pulmonary effort is normal. No tachypnea.     Breath sounds: Normal breath sounds.  Chest:     Comments: Subcutaneous edema of the chest wall Abdominal:     Palpations: Abdomen is soft.     Tenderness: There is no abdominal tenderness.  Musculoskeletal:     Right lower leg: Edema present.     Left lower leg: Edema present.     Comments: 2+ pitting edema to the knees bilaterally.  She also has some edema of the bilateral forearms.  The right forearm is very minimally erythematous but not warm.  Distal pulses are palpable in all extremities.   Skin:    General: Skin is warm and dry.  Neurological:     General: No focal deficit present.     Mental Status: She is alert and oriented to person, place, and time.  Psychiatric:        Mood and Affect: Mood normal.        Behavior: Behavior normal.    ED Results / Procedures / Treatments   Labs (all labs ordered are listed, but only abnormal results are displayed) Labs Reviewed  BASIC METABOLIC PANEL - Abnormal; Notable for the following components:      Result Value   Chloride 113 (*)    BUN 32 (*)    Creatinine, Ser 1.86 (*)    GFR, Estimated 27 (*)    All other components within normal limits  CBC - Abnormal; Notable for the following components:   Hemoglobin 11.1 (*)    MCH 25.5 (*)    RDW 19.1 (*)    All other components within normal limits  BRAIN NATRIURETIC PEPTIDE - Abnormal; Notable for the following components:   B Natriuretic Peptide 125.8 (*)    All other components within normal limits  TROPONIN I (HIGH SENSITIVITY) - Abnormal; Notable for the following components:   Troponin I (High Sensitivity) 181 (*)    All other components within normal limits  RESP PANEL BY RT-PCR (FLU A&B, COVID) [id number]  TROPONIN I (HIGH SENSITIVITY)    EKG EKG Interpretation  Date/Time:  Thursday June 11 2021 18:49:59 EDT Ventricular Rate:  71 PR Interval:  102 QRS Duration: 66 QT Interval:  376 QTC Calculation: 408 R Axis:   68 Text Interpretation: Sinus rhythm with short PR Axis normal minimal ST elevation anterior V leads No prior for comparison Confirmed by Lorre Munroe ([id number]) on 06/11/2021 11:30:37 PM  Radiology DG Chest 2 View  Result Date: 06/11/2021 CLINICAL DATA:  Shortness of breath. Bilateral lower extremity swelling EXAM: CHEST - 2 VIEW COMPARISON:  05/02/2017 chest radiograph.  Chest CT 12/30/2020 FINDINGS: Mild hyperinflation. Midline trachea. Patient rotated minimally left. Mild cardiomegaly. Atherosclerosis in the transverse aorta. Small left pleural  effusion. No pneumothorax. No congestive failure. Left greater than right base airspace disease. IMPRESSION: Small left pleural effusion with adjacent airspace disease, atelectasis or pneumonia. Minimal right base subsegmental atelectasis. Cardiomegaly without congestive failure. Aortic Atherosclerosis (ICD10-I70.0). Electronically Signed   By: Abigail Miyamoto M.D.   On: 06/11/2021 19:54    Procedures Procedures   Medications Ordered in ED Medications  furosemide (LASIX) injection 20 mg (has no administration in time range)  labetalol (NORMODYNE) injection 20 mg (has no administration in time range)    ED Course  I have reviewed the triage vital signs and the nursing notes.  Pertinent labs & imaging results that were available during my care of the patient were reviewed by me and considered in my medical decision making (see chart for details).  Clinical Course as of 06/12/21 0126  Fri Jun 12, 2021  0125 I spoke with Dr. Alcario Drought who will admit the patient. [AW]    Clinical Course User Index [AW] Arnaldo Natal, MD   MDM Rules/Calculators/A&P                           [name] presented with fatigue and dyspnea on exertion as well as extensive peripheral edema.  She was noted to be markedly hypertensive, and I gave her a dose of labetalol with good results.  She was also given Lasix preemptively for possible heart failure.  The patient furthermore had a slightly elevated but stable troponins.  Chest x-ray revealed pleural effusion with equivocal pneumonia.  Pneumonia does not fit her clinical history.  Patient will be admitted to the hospital for new onset right-sided heart failure. Final Clinical Impression(s) / ED Diagnoses Final diagnoses:  Acute right-sided congestive heart failure ([place])    Rx / DC Orders ED Discharge Orders     None  Arnaldo Natal, MD 06/12/21 [phone]

## 2021-06-12 ENCOUNTER — Inpatient Hospital Stay (HOSPITAL_COMMUNITY): Payer: Medicare Other

## 2021-06-12 ENCOUNTER — Encounter (HOSPITAL_COMMUNITY): Payer: Self-pay | Admitting: Internal Medicine

## 2021-06-12 ENCOUNTER — Ambulatory Visit: Payer: Self-pay

## 2021-06-12 DIAGNOSIS — N179 Acute kidney failure, unspecified: Secondary | ICD-10-CM | POA: Diagnosis not present

## 2021-06-12 DIAGNOSIS — I130 Hypertensive heart and chronic kidney disease with heart failure and stage 1 through stage 4 chronic kidney disease, or unspecified chronic kidney disease: Secondary | ICD-10-CM | POA: Diagnosis present

## 2021-06-12 DIAGNOSIS — E119 Type 2 diabetes mellitus without complications: Secondary | ICD-10-CM | POA: Diagnosis not present

## 2021-06-12 DIAGNOSIS — Z7401 Bed confinement status: Secondary | ICD-10-CM | POA: Diagnosis not present

## 2021-06-12 DIAGNOSIS — I509 Heart failure, unspecified: Secondary | ICD-10-CM

## 2021-06-12 DIAGNOSIS — Z90710 Acquired absence of both cervix and uterus: Secondary | ICD-10-CM | POA: Diagnosis not present

## 2021-06-12 DIAGNOSIS — I50811 Acute right heart failure: Secondary | ICD-10-CM | POA: Diagnosis not present

## 2021-06-12 DIAGNOSIS — Z79890 Hormone replacement therapy (postmenopausal): Secondary | ICD-10-CM | POA: Diagnosis not present

## 2021-06-12 DIAGNOSIS — R0602 Shortness of breath: Secondary | ICD-10-CM | POA: Diagnosis present

## 2021-06-12 DIAGNOSIS — E46 Unspecified protein-calorie malnutrition: Secondary | ICD-10-CM

## 2021-06-12 DIAGNOSIS — I5031 Acute diastolic (congestive) heart failure: Secondary | ICD-10-CM | POA: Diagnosis not present

## 2021-06-12 DIAGNOSIS — I110 Hypertensive heart disease with heart failure: Secondary | ICD-10-CM | POA: Diagnosis not present

## 2021-06-12 DIAGNOSIS — J4520 Mild intermittent asthma, uncomplicated: Secondary | ICD-10-CM | POA: Diagnosis present

## 2021-06-12 DIAGNOSIS — Z7982 Long term (current) use of aspirin: Secondary | ICD-10-CM | POA: Diagnosis not present

## 2021-06-12 DIAGNOSIS — Z87891 Personal history of nicotine dependence: Secondary | ICD-10-CM | POA: Diagnosis not present

## 2021-06-12 DIAGNOSIS — E039 Hypothyroidism, unspecified: Secondary | ICD-10-CM | POA: Diagnosis present

## 2021-06-12 DIAGNOSIS — Z20822 Contact with and (suspected) exposure to covid-19: Secondary | ICD-10-CM | POA: Diagnosis not present

## 2021-06-12 DIAGNOSIS — N183 Chronic kidney disease, stage 3 unspecified: Secondary | ICD-10-CM

## 2021-06-12 DIAGNOSIS — R630 Anorexia: Secondary | ICD-10-CM

## 2021-06-12 DIAGNOSIS — Z7984 Long term (current) use of oral hypoglycemic drugs: Secondary | ICD-10-CM | POA: Diagnosis not present

## 2021-06-12 DIAGNOSIS — I10 Essential (primary) hypertension: Secondary | ICD-10-CM

## 2021-06-12 DIAGNOSIS — R7303 Prediabetes: Secondary | ICD-10-CM

## 2021-06-12 DIAGNOSIS — I129 Hypertensive chronic kidney disease with stage 1 through stage 4 chronic kidney disease, or unspecified chronic kidney disease: Secondary | ICD-10-CM

## 2021-06-12 DIAGNOSIS — J449 Chronic obstructive pulmonary disease, unspecified: Secondary | ICD-10-CM | POA: Diagnosis present

## 2021-06-12 DIAGNOSIS — Z823 Family history of stroke: Secondary | ICD-10-CM | POA: Diagnosis not present

## 2021-06-12 DIAGNOSIS — R627 Adult failure to thrive: Secondary | ICD-10-CM | POA: Diagnosis present

## 2021-06-12 DIAGNOSIS — E1122 Type 2 diabetes mellitus with diabetic chronic kidney disease: Secondary | ICD-10-CM | POA: Diagnosis present

## 2021-06-12 DIAGNOSIS — Z79818 Long term (current) use of other agents affecting estrogen receptors and estrogen levels: Secondary | ICD-10-CM | POA: Diagnosis not present

## 2021-06-12 DIAGNOSIS — E041 Nontoxic single thyroid nodule: Secondary | ICD-10-CM | POA: Diagnosis present

## 2021-06-12 DIAGNOSIS — Z79899 Other long term (current) drug therapy: Secondary | ICD-10-CM | POA: Diagnosis not present

## 2021-06-12 DIAGNOSIS — D631 Anemia in chronic kidney disease: Secondary | ICD-10-CM | POA: Diagnosis present

## 2021-06-12 DIAGNOSIS — N1831 Chronic kidney disease, stage 3a: Secondary | ICD-10-CM | POA: Diagnosis present

## 2021-06-12 DIAGNOSIS — Z8249 Family history of ischemic heart disease and other diseases of the circulatory system: Secondary | ICD-10-CM | POA: Diagnosis not present

## 2021-06-12 LAB — BASIC METABOLIC PANEL
Anion gap: 9 (ref 5–15)
BUN: 33 mg/dL — ABNORMAL HIGH (ref 8–23)
CO2: 29 mmol/L (ref 22–32)
Calcium: 9.5 mg/dL (ref 8.9–10.3)
Chloride: 107 mmol/L (ref 98–111)
Creatinine, Ser: 1.79 mg/dL — ABNORMAL HIGH (ref 0.44–1.00)
GFR, Estimated: 28 mL/min — ABNORMAL LOW (ref >60)
Glucose, Bld: 91 mg/dL (ref 70–99)
Potassium: 4 mmol/L (ref 3.5–5.1)
Sodium: 145 mmol/L (ref 135–145)

## 2021-06-12 LAB — HEPATIC FUNCTION PANEL
ALT: 13 U/L (ref 0–44)
AST: 19 U/L (ref 15–41)
Albumin: 2.9 g/dL — ABNORMAL LOW (ref 3.5–5.0)
Alkaline Phosphatase: 34 U/L — ABNORMAL LOW (ref 38–126)
Bilirubin, Direct: <0.1 mg/dL (ref 0.0–0.2)
Total Bilirubin: 0.7 mg/dL (ref 0.3–1.2)
Total Protein: 5.5 g/dL — ABNORMAL LOW (ref 6.5–8.1)

## 2021-06-12 LAB — RESP PANEL BY RT-PCR (FLU A&B, COVID) ARPGX2
Influenza A by PCR: NEGATIVE
Influenza B by PCR: NEGATIVE
SARS Coronavirus 2 by RT PCR: NEGATIVE

## 2021-06-12 LAB — URINALYSIS, COMPLETE (UACMP) WITH MICROSCOPIC
Bacteria, UA: NONE SEEN
Bilirubin Urine: NEGATIVE
Glucose, UA: NEGATIVE mg/dL
Hgb urine dipstick: NEGATIVE
Ketones, ur: NEGATIVE mg/dL
Leukocytes,Ua: NEGATIVE
Nitrite: NEGATIVE
Protein, ur: 100 mg/dL — AB
Specific Gravity, Urine: 1.009 (ref 1.005–1.030)
pH: 5 (ref 5.0–8.0)

## 2021-06-12 LAB — GLUCOSE, CAPILLARY
Glucose-Capillary: 120 mg/dL — ABNORMAL HIGH (ref 70–99)
Glucose-Capillary: 107 mg/dL — ABNORMAL HIGH (ref 70–99)

## 2021-06-12 LAB — CBG MONITORING, ED
Glucose-Capillary: 80 mg/dL (ref 70–99)
Glucose-Capillary: 97 mg/dL (ref 70–99)

## 2021-06-12 LAB — ECHOCARDIOGRAM COMPLETE
Area-P 1/2: 3.17 cm2
Height: 61.614 in
S' Lateral: 2.7 cm
Weight: 1376 oz

## 2021-06-12 LAB — TROPONIN I (HIGH SENSITIVITY): Troponin I (High Sensitivity): 189 ng/L (ref <18)

## 2021-06-12 MED ORDER — ONDANSETRON HCL 4 MG PO TABS: 4.0000 mg | ORAL_TABLET | Freq: Four times a day (QID) | ORAL | Status: DC | PRN

## 2021-06-12 MED ORDER — AMLODIPINE BESYLATE 10 MG PO TABS
10.0000 mg | ORAL_TABLET | Freq: Every day | ORAL | Status: DC
  Administered 2021-06-12 – 2021-06-13 (×2): 10 mg via ORAL
  Filled 2021-06-12: qty 2
  Filled 2021-06-12: qty 1

## 2021-06-12 MED ORDER — ASPIRIN 325 MG PO TABS
325.0000 mg | ORAL_TABLET | Freq: Every day | ORAL | Status: DC
  Administered 2021-06-12 – 2021-06-13 (×2): 325 mg via ORAL
  Filled 2021-06-12 (×2): qty 1

## 2021-06-12 MED ORDER — ONDANSETRON HCL 4 MG/2ML IJ SOLN: 4.0000 mg | Freq: Four times a day (QID) | INTRAMUSCULAR | Status: DC | PRN

## 2021-06-12 MED ORDER — LEVOTHYROXINE SODIUM 75 MCG PO TABS
37.5000 ug | ORAL_TABLET | Freq: Every day | ORAL | Status: DC
  Administered 2021-06-12 – 2021-06-13 (×2): 37.5 ug via ORAL
  Filled 2021-06-12 (×2): qty 1

## 2021-06-12 MED ORDER — ACETAMINOPHEN 650 MG RE SUPP: 650.0000 mg | Freq: Four times a day (QID) | RECTAL | Status: DC | PRN

## 2021-06-12 MED ORDER — ACETAMINOPHEN 325 MG PO TABS: 650.0000 mg | ORAL_TABLET | Freq: Four times a day (QID) | ORAL | Status: DC | PRN

## 2021-06-12 MED ORDER — FUROSEMIDE 10 MG/ML IJ SOLN
60.0000 mg | Freq: Two times a day (BID) | INTRAMUSCULAR | Status: DC
  Administered 2021-06-12: 60 mg via INTRAVENOUS
  Filled 2021-06-12 (×2): qty 6

## 2021-06-12 MED ORDER — DICLOFENAC SODIUM 1 % EX GEL: 2.0000 g | Freq: Four times a day (QID) | CUTANEOUS | Status: DC | PRN

## 2021-06-12 MED ORDER — ISOSORB DINITRATE-HYDRALAZINE 20-37.5 MG PO TABS
1.0000 | ORAL_TABLET | Freq: Three times a day (TID) | ORAL | Status: DC
  Administered 2021-06-12 – 2021-06-13 (×5): 1 via ORAL
  Filled 2021-06-12 (×7): qty 1

## 2021-06-12 MED ORDER — MEGESTROL ACETATE 400 MG/10ML PO SUSP
625.0000 mg | Freq: Every day | ORAL | Status: DC
  Administered 2021-06-12 (×2): 625 mg via ORAL
  Filled 2021-06-12 (×3): qty 20

## 2021-06-12 MED ORDER — MIRTAZAPINE 15 MG PO TABS
15.0000 mg | ORAL_TABLET | Freq: Every day | ORAL | Status: DC
  Administered 2021-06-12 (×2): 15 mg via ORAL
  Filled 2021-06-12 (×3): qty 1

## 2021-06-12 MED ORDER — INSULIN ASPART 100 UNIT/ML IJ SOLN
0.0000 [IU] | Freq: Three times a day (TID) | INTRAMUSCULAR | Status: DC
  Administered 2021-06-13: 1 [IU] via SUBCUTANEOUS

## 2021-06-12 MED ORDER — FUROSEMIDE 10 MG/ML IJ SOLN
40.0000 mg | Freq: Once | INTRAMUSCULAR | Status: AC
  Administered 2021-06-12: 40 mg via INTRAVENOUS
  Filled 2021-06-12: qty 4

## 2021-06-12 MED ORDER — ALBUTEROL SULFATE (2.5 MG/3ML) 0.083% IN NEBU: 2.5000 mg | INHALATION_SOLUTION | Freq: Four times a day (QID) | RESPIRATORY_TRACT | Status: DC | PRN

## 2021-06-12 MED ORDER — FUROSEMIDE 10 MG/ML IJ SOLN: 60.0000 mg | Freq: Every day | INTRAMUSCULAR | Status: DC

## 2021-06-12 MED ORDER — POTASSIUM CHLORIDE CRYS ER 20 MEQ PO TBCR
20.0000 meq | EXTENDED_RELEASE_TABLET | Freq: Every day | ORAL | Status: DC
  Administered 2021-06-12: 20 meq via ORAL
  Filled 2021-06-12: qty 1

## 2021-06-12 MED ORDER — MOMETASONE FURO-FORMOTEROL FUM 100-5 MCG/ACT IN AERO
2.0000 | INHALATION_SPRAY | Freq: Two times a day (BID) | RESPIRATORY_TRACT | Status: DC
  Administered 2021-06-12 – 2021-06-13 (×3): 2 via RESPIRATORY_TRACT
  Filled 2021-06-12 (×2): qty 8.8

## 2021-06-12 MED ORDER — ROSUVASTATIN CALCIUM 20 MG PO TABS
20.0000 mg | ORAL_TABLET | Freq: Every day | ORAL | Status: DC
  Administered 2021-06-12 – 2021-06-13 (×2): 20 mg via ORAL
  Filled 2021-06-12 (×2): qty 1

## 2021-06-12 MED ORDER — ENOXAPARIN SODIUM 300 MG/3ML IJ SOLN
20.0000 mg | INTRAMUSCULAR | Status: DC
  Administered 2021-06-12: 20 mg via SUBCUTANEOUS
  Filled 2021-06-12 (×4): qty 0.2

## 2021-06-12 NOTE — ED Notes (Signed)
Purwick moved while pt was sleeping. Bed changed. Pt helped with pericare. New purwick applied and provided with no slip socks. Toothbrush/Toothpaste at bedside

## 2021-06-12 NOTE — Progress Notes (Signed)
Heart Failure Nurse Navigator Progress Note  HF Navigation team following for HV TOC readiness. Admission for new onset CHF.  Pending ECHO.   Pricilla Holm, MSN, RN Heart Failure Nurse Navigator [phone]

## 2021-06-12 NOTE — ED Notes (Signed)
Dr. Alcario Drought at bedside conversing with pt.

## 2021-06-12 NOTE — Chronic Care Management (AMB) (Signed)
  Chronic Care Management   Inpatient Admit Review Note  06/12/2021 Name: Leslie Reeves MRN: [id number] DOB: 1941-06-20  Leslie Reeves is a 80 y.o. year old female who is a primary care patient of [name], [place]. Leslie Reeves is actively engaged with the embedded care management team in the primary care practice and is being followed by RN Case Manager and BSW  for assistance with disease management and care coordination needs related to  HTN, decreased appetite, abnormal weight loss, and increased forgetfulness .   Leslie Reeves is currently admitted to the hospital for evaluation and treatment of CHF.   Plan: CM team will collaborate with [facility] and will follow patient post discharge.    [name], BSW, CDP Social Worker, Certified Dementia Practitioner [place] [facility] [phone]

## 2021-06-12 NOTE — Progress Notes (Signed)
  Echocardiogram 2D Echocardiogram has been performed.  Leslie Reeves 06/12/2021, 11:51 AM

## 2021-06-12 NOTE — Progress Notes (Signed)
Heart Failure Nurse Navigator Progress Note  PCP: [name], FNP PCP-Cardiologist: none Admission Diagnosis: new onset CHF  Admitted from: home with adult daughter  Presentation:   Leslie Reeves presented 9/15 with SOB and increased BLE edema. Pt active with [facility]. Pt lives with daughter. Does not drive, daughter takes to appts. Daughter states pt eats minimally on a daily basis. Pt does not do much mobilizing at home per daughter. Pt takes medications as prescribed. Daughter states pt has follow up appt 9/26 with nephrology.  ECHO/ LVEF: 60-65%, G1DD. Mild MVR, mild-mod TVR  Clinical Course:  Past Medical History:  Diagnosis Date   Allergy    Diabetes mellitus without complication ([place])    Hypertension      Social History   Socioeconomic History   Marital status: Married    Spouse name: Not on file   Number of children: 2   Years of education: Not on file   Highest education level: High school graduate  Occupational History   Occupation: retired  Tobacco Use   Smoking status: Former    Packs/day: 0.50    Years: 50.00    Pack years: 25.00    Types: Cigarettes    Quit date: 2017    Years since quitting: 5.7   Smokeless tobacco: Never  Vaping Use   Vaping Use: Never used  Substance and Sexual Activity   Alcohol use: Not Currently   Drug use: Not Currently   Sexual activity: Not Currently  Other Topics Concern   Not on file  Social History Narrative   Not on file   Social Determinants of Health   Financial Resource Strain: Low Risk    Difficulty of Paying Living Expenses: Not hard at all  Food Insecurity: No Food Insecurity   Worried About Charity fundraiser in the Last Year: Never true   [place] in the Last Year: Never true  Transportation Needs: No Transportation Needs   Lack of Transportation (Medical): No   Lack of Transportation (Non-Medical): No  Physical Activity: Inactive   Days of Exercise per Week: 0 days   Minutes of Exercise per  Session: 0 min  Stress: No Stress Concern Present   Feeling of Stress : Not at all  Social Connections: Not on file    High Risk Criteria for Readmission and/or Poor Patient Outcomes: Heart failure hospital admissions (last 6 months): 1  No Show rate: 12% Difficult social situation: no Demonstrates medication adherence: yes Primary Language: English Literacy level: able to read/write and comprehend. Wears glasses.  Education Assessment and Provision:  Detailed education and instructions provided on heart failure disease management including the following:  Signs and symptoms of Heart Failure When to call the physician Importance of daily weights Low sodium diet Fluid restriction Medication management Anticipated future follow-up appointments  Patient education given on each of the above topics.  Patient acknowledges understanding via teach back method and acceptance of all instructions.  Education Materials:  "Living Better With Heart Failure" Booklet, HF zone tool, & Daily Weight Tracker Tool.  Patient has scale at home: yes Patient has pill box at home: yes   Barriers of Care:   -new Dx  Considerations/Referrals:   Referral made to Heart Failure Pharmacist Stewardship: no Referral made to Heart Failure CSW/NCM TOC: no Referral made to Heart & Vascular TOC clinic: pending, SCr climbing.   Items for Follow-up on DC/TOC: -optimize   Pricilla Holm, MSN, RN Heart Failure Nurse Navigator [phone]

## 2021-06-12 NOTE — H&P (Signed)
History and Physical    Leslie Reeves ZSW:[id number] DOB: [birth year] DOA: 06/11/2021  PCP: [name], FNP  Patient coming from: Home  I have personally briefly reviewed patient's old medical records in [place]  Chief Complaint: SOB  HPI: Leslie Reeves is a 80 y.o. female with medical history significant of DM2, HTN.  At baseline looks like she has CKD 3a, creat in July was 1.2.  Pt presents to ED with c/o DOE for about 1 month.  Symptoms are severe to the point where she has been basically bed bound.  Daughter has been trying to get mother to seek medical care but has been unable to do so until tonight.  Pt denies CP, cough.  Does note peripheral edema in BUE and BLE.  L>R but she sleeps on her L side.  Denies orthopnea or PND, but endorses severe DOE.  No abd pain, CP, cough, fever, sore throat.   ED Course: BNP 125.  Trops 181 and 189.  Creat 1.86 up from 1.22 in July.  COVID neg.  CXR = cardiomegaly without CHF.   Review of Systems: As per HPI, otherwise all review of systems negative.  Past Medical History:  Diagnosis Date   Allergy    Diabetes mellitus without complication ([place])    Hypertension     Past Surgical History:  Procedure Laterality Date   ABDOMINAL HYSTERECTOMY       reports that she quit smoking about 5 years ago. Her smoking use included cigarettes. She has a 7.50 pack-year smoking history. She has never used smokeless tobacco. She reports that she does not currently use alcohol. She reports that she does not currently use drugs.  Allergies  Allergen Reactions   Penicillin G Rash   Penicillins Rash    Family History  Problem Relation Age of Onset   Heart disease Other    Stroke Other    Hypertension Other    COPD Other    Cancer Other    Asthma Other    Breast cancer Neg Hx      Prior to Admission medications   Medication Sig Start Date End Date Taking? Authorizing Provider  Accu-Chek Softclix Lancets lancets Use as  instructed 03/04/21  Yes [name], FNP  albuterol (VENTOLIN HFA) 108 (90 Base) MCG/ACT inhaler INHALE 2 PUFFS BY MOUTH 4 TIMES A DAY Patient taking differently: Inhale 2 puffs into the lungs every 6 (six) hours as needed for shortness of breath or wheezing. 03/12/21  Yes Icard, Bradley L, DO  amLODipine (NORVASC) 10 MG tablet TAKE 1 TABLET BY MOUTH EVERY DAY Patient taking differently: Take 10 mg by mouth daily. 12/09/20  Yes [name], FNP  aspirin (BAYER ASPIRIN) 325 MG tablet Take 1 tablet (325 mg total) by mouth daily. 03/09/21 03/09/22 Yes [name], FNP  blood glucose meter kit and supplies KIT Dispense based on patient and insurance preference. Use up to four times daily as directed. (FOR ICD-9 250.00, 250.01). 11/23/18  Yes [name], FNP  diclofenac sodium (VOLTAREN) 1 % GEL Apply 2 g topically 4 (four) times daily. Patient taking differently: Apply 2 g topically 4 (four) times daily as needed (pain). 02/15/19  Yes [name], FNP  fluticasone-salmeterol (WIXELA INHUB) 100-50 MCG/ACT AEPB TAKE 1 PUFF BY MOUTH EVERY 12 HOURS IN THE MORNING AND IN THE EVENING 03/12/21  Yes Icard, Bradley L, DO  glucose blood (ACCU-CHEK AVIVA PLUS) test strip Use as instructed 11/27/18  Yes [name], FNP  levothyroxine (  SYNTHROID) 25 MCG tablet TAKE 1 AND 1/2 TABLETS BY MOUTH DAILY Patient taking differently: Take 37.5 mcg by mouth daily before breakfast. 02/27/21  Yes [name], FNP  megestrol (MEGACE ES) 625 MG/5ML suspension Take 5 mLs (625 mg total) by mouth daily. Patient taking differently: Take 625 mg by mouth at bedtime. 03/26/20  Yes [name], FNP  metFORMIN (GLUCOPHAGE) 500 MG tablet TAKE 1/2 TABLET BY MOUTH 2 TIMES DAILY Patient taking differently: Take 250 mg by mouth 2 (two) times daily with a meal. 02/27/21  Yes [name], FNP  mirtazapine (REMERON) 15 MG tablet TAKE 1 TABLET BY MOUTH EVERYDAY AT BEDTIME Patient taking differently: Take 15 mg by mouth at bedtime. 02/27/21   Yes [name], FNP  olmesartan (BENICAR) 20 MG tablet Take 1 tablet (20 mg total) by mouth daily. 02/17/21  Yes [name], FNP  rosuvastatin (CRESTOR) 20 MG tablet TAKE 1 TABLET BY MOUTH EVERY DAY Patient taking differently: Take 20 mg by mouth daily. 06/05/21  Yes [name], FNP  Vitamin D, Ergocalciferol, (DRISDOL) 1.25 MG (50000 UNIT) CAPS capsule Take 50,000 Units by mouth 2 (two) times a week. 02/11/21  Yes [provider]    Physical Exam: Vitals:   06/12/21 0045 06/12/21 0100 06/12/21 0115 06/12/21 0130  BP: (!) 167/76 (!) 179/71 (!) 165/66 (!) 159/66  Pulse: (!) 57 62 (!) 58 62  Resp: 15 (!) 21 13 (!) 27  Temp:      TempSrc:      SpO2: 95% 95% 95% 97%    Constitutional: NAD, calm, comfortable Eyes: PERRL, lids and conjunctivae normal ENMT: Mucous membranes are moist. Posterior pharynx clear of any exudate or lesions.Normal dentition.  Neck: normal, supple, no masses, no thyromegaly Respiratory: clear to auscultation bilaterally, no wheezing, no crackles. Normal respiratory effort. No accessory muscle use.  Cardiovascular: RRR, pt with anasarca, edema of all 4 extremities, L>R (pt sleeps on L side).  JVD present Abdomen: no tenderness, no masses palpated. No hepatosplenomegaly. Bowel sounds positive.  Musculoskeletal: no clubbing / cyanosis. No joint deformity upper and lower extremities. Good ROM, no contractures. Normal muscle tone.  Skin: no rashes, lesions, ulcers. No induration Neurologic: CN 2-12 grossly intact. Sensation intact, DTR normal. Strength 5/5 in all 4.  Psychiatric: Normal judgment and insight. Alert and oriented x 3. Normal mood.    Labs on Admission: I have personally reviewed following labs and imaging studies  CBC: Recent Labs  Lab 06/11/21 1921  WBC 5.4  HGB 11.1*  HCT 36.2  MCV 83.2  PLT 099   Basic Metabolic Panel: Recent Labs  Lab 06/11/21 1921  NA 144  K 4.3  CL 113*  CO2 25  GLUCOSE 93  BUN 32*  CREATININE 1.86*   CALCIUM 9.2   GFR: CrCl cannot be calculated (Unknown ideal weight.). Liver Function Tests: No results for input(s): AST, ALT, ALKPHOS, BILITOT, PROT, ALBUMIN in the last 168 hours. No results for input(s): LIPASE, AMYLASE in the last 168 hours. No results for input(s): AMMONIA in the last 168 hours. Coagulation Profile: No results for input(s): INR, PROTIME in the last 168 hours. Cardiac Enzymes: No results for input(s): CKTOTAL, CKMB, CKMBINDEX, TROPONINI in the last 168 hours. BNP (last 3 results) No results for input(s): PROBNP in the last 8760 hours. HbA1C: No results for input(s): HGBA1C in the last 72 hours. CBG: No results for input(s): GLUCAP in the last 168 hours. Lipid Profile: No results for input(s): CHOL, HDL, LDLCALC, TRIG, CHOLHDL, LDLDIRECT in  the last 72 hours. Thyroid Function Tests: No results for input(s): TSH, T4TOTAL, FREET4, T3FREE, THYROIDAB in the last 72 hours. Anemia Panel: No results for input(s): VITAMINB12, FOLATE, FERRITIN, TIBC, IRON, RETICCTPCT in the last 72 hours. Urine analysis: No results found for: COLORURINE, APPEARANCEUR, LABSPEC, [place], GLUCOSEU, HGBUR, BILIRUBINUR, KETONESUR, PROTEINUR, UROBILINOGEN, NITRITE, LEUKOCYTESUR  Radiological Exams on Admission: DG Chest 2 View  Result Date: 06/11/2021 CLINICAL DATA:  Shortness of breath. Bilateral lower extremity swelling EXAM: CHEST - 2 VIEW COMPARISON:  05/02/2017 chest radiograph.  Chest CT 12/30/2020 FINDINGS: Mild hyperinflation. Midline trachea. Patient rotated minimally left. Mild cardiomegaly. Atherosclerosis in the transverse aorta. Small left pleural effusion. No pneumothorax. No congestive failure. Left greater than right base airspace disease. IMPRESSION: Small left pleural effusion with adjacent airspace disease, atelectasis or pneumonia. Minimal right base subsegmental atelectasis. Cardiomegaly without congestive failure. Aortic Atherosclerosis (ICD10-I70.0). Electronically Signed    By: Abigail Miyamoto M.D.   On: 06/11/2021 19:54    EKG: Independently reviewed.  Assessment/Plan Principal Problem:   New onset of congestive heart failure (HCC) Active Problems:   Essential hypertension   DM2 (diabetes mellitus, type 2) (HCC)   AKI (acute kidney injury) ([place])    New onset CHF - Looks like new onset R sided CHF most likely DDx includes hepatogenic and nephrogenic edema But trop elevation and DOE is particularly suspicious for cardiogenic even in absence of pulmonary edema Patient presents with: dyspnea on exertion / increased shortness of breath Exam findings include: JUGULAR VENOUS DISTENTION and bilateral leg edema CHF pathway Trops flat at 181 and 189, no CP, doubt ACS at this point. Patient is being treated with IV diuretics: Lasix 62m IV daily for the moment Daily BMP 2d echo pending Tele monitor Probably warrants cards consult after 2d echo in setting of new onset CHF Check LFT (for albumin) Check UA (urine protein) HTN - Cont amlodipine HR on low side: dont want to start BB right now Has AKI - will hold ACEi for the moment Will put pt on BIDIL TID for now If renal function improves with diuresis, consider switching back to ACEi and/or entresto AKI on CKD 3a - ? Cardiorenal syndrome Pt clearly fluid overloaded on exam Trying lasix Hold ACEi Daily BMP with diuresis If creat worsens instead of improves with lasix, likely will need cards consult in AM. DM2 - Hold metformin Sensitive SSI AC Depending on how renal fxn trends, consider jardiance on discharge  DVT prophylaxis: Lovenox Code Status: Full Family Communication: No family in room Disposition Plan: Home after CHF work up and diuresis Consults called: None Admission status: Admit to inpatient  Severity of Illness: The appropriate patient status for this patient is INPATIENT. Inpatient status is judged to be reasonable and necessary in order to provide the required intensity of service to  ensure the patient's safety. The patient's presenting symptoms, physical exam findings, and initial radiographic and laboratory data in the context of their chronic comorbidities is felt to place them at high risk for further clinical deterioration. Furthermore, it is not anticipated that the patient will be medically stable for discharge from the hospital within 2 midnights of admission. The following factors support the patient status of inpatient.   IP status due to: 1) new onset of CHF as discussed in A/P above 2) Patient has acute kidney injury.  Patient has one of the following: Increase in Serum Creatinine >0.3 mg/dL within 48h Increase in Serum Creatinine > 1.5 times baseline known or presumed to have been  within the last 7 days Urine volume < 0.5 ml/kg/hr for 6 hours    * I certify that at the point of admission it is my clinical judgment that the patient will require inpatient hospital care spanning beyond 2 midnights from the point of admission due to high intensity of service, high risk for further deterioration and high frequency of surveillance required.*   Hunner Garcon M. DO [facility]  How to contact the [facility] Attending or Consulting provider [place] or covering provider during after hours [place], for this patient?  Check the care team in [facility] and look for a) attending/consulting [facility] provider listed and b) the [facility] team listed Log into [email or web address]  Amion Physician Scheduling and messaging for groups and whole hospitals  On call and physician scheduling software for group practices, residents, hospitalists and other medical providers for call, clinic, rotation and shift schedules. On[employer] is a hospital-wide system for scheduling doctors and paging doctors on call. EasyPlot is for scientific plotting and data analysis.  [email or web address]  and use [place] universal password to access. If you do not have the password, please contact the hospital operator.   Locate the [facility] provider you are looking for under [facility] and page to a number that you can be directly reached. If you still have difficulty reaching the provider, please page the [facility] (Director on Call) for the Hospitalists listed on amion for assistance.  06/12/2021, 1:35 AM

## 2021-06-12 NOTE — ED Notes (Signed)
Transferred pt onto hospital bed. Pt repositioned and comfortable. Call bell within reach. [place] appropriately positioned.

## 2021-06-12 NOTE — ED Notes (Signed)
Hospital bed ordered for pt.

## 2021-06-12 NOTE — Progress Notes (Signed)
PROGRESS NOTE    Leslie Reeves  UOH:[id number] DOB: September 13, 1941 DOA: 06/11/2021 PCP: [name], FNP      Brief Narrative:  Leslie Reeves is a 80 y.o. F with HTN, DM, CKD IIIa baseline 1.2 who presented with dyspnea on exertion for 1 month and swelling.    Patient's symptoms started about a month ago, progressively dyspneic on exertion until she could barely get out of bed.  Started to develop some peripheral edema.  In the ER, BNP 125, troponins 181 and flat.  Creatinine 1.8.  Chest x-ray with cardiomegaly.  Admitted and started on Lasix for CHF.        Assessment & Plan:  Acute diastolic CHF Presents with dyspnea on exertion, JVD and lower extremity edema, cardiomegaly on chest x-ray, elevated BNP.  No previous chronic CHF.  Echo here shows EF 52-08, grade 1 diastolic dysfunction, normal valves, no pericardial effusion, no pleural effusion.  -Furosemide 60 mg IV twice a day  -K supplement -Strict I/Os, daily weights, telemetry  -Daily monitoring renal function   Hypertension - Continue amlodipine, vital  Diabetes - Continue sliding scale corrections - Continue aspirin and Crestor  Hypothyroidism - Continue levothyroxine  Failure to thrive - Continue Megace, mirtazapine  COPD - Continue ICS/LABA  Anemia of chronic disease         Disposition: Status is: Inpatient  Remains inpatient appropriate because:IV treatments appropriate due to intensity of illness or inability to take PO  Dispo: The patient is from: Home              Anticipated d/c is to:  TBD              Patient currently is not medically stable to d/c.   Difficult to place patient No              MDM: This is a no charge note.  For further details, please see H&P by my partner Dr. Alcario Drought from earlier today.  The below labs and imaging reports were reviewed and summarized above.    DVT prophylaxis: Lonvenox  Code Status: FULL Family Communication:           Subjective: Feeling tired, still swollen, hasn't gotten out of bed.  No confusion.        Objective: Vitals:   06/12/21 1300 06/12/21 1339 06/12/21 1549 06/12/21 1636  BP: (!) 142/64 135/60 129/61 (!) 124/48  Pulse: 68 60 68 68  Resp: _0 Temp: 97.9 F (36.6 C) 98.4 F (36.9 C)  98 F (36.7 C)  TempSrc: Oral Oral  Oral  SpO2: 92% 95%  95%  Weight:  45 kg    Height:  5' 1" (1.549 m)      Intake/Output Summary (Last 24 hours) at 06/12/2021 1704 Last data filed at 06/12/2021 0421 Gross per 24 hour  Intake --  Output 650 ml  Net -650 ml   Filed Weights   06/12/21 0130 06/12/21 1339  Weight: 39 kg 45 kg    Examination: The patient was seen and examined.      Data Reviewed: I have personally reviewed following labs and imaging studies:  CBC: Recent Labs  Lab 06/11/21 1921  WBC 5.4  HGB 11.1*  HCT 36.2  MCV 83.2  PLT 022   Basic Metabolic Panel: Recent Labs  Lab 06/11/21 1921 06/12/21 0428  NA 144 145  K 4.3 4.0  CL 113* 107  CO2 25 29  GLUCOSE 93 91  BUN 32* 33*  CREATININE 1.86* 1.79*  CALCIUM 9.2 9.5   GFR: Estimated Creatinine Clearance: 17.8 mL/min (A) (by C-G formula based on SCr of 1.79 mg/dL (H)). Liver Function Tests: Recent Labs  Lab 06/12/21 0428  AST 19  ALT 13  ALKPHOS 34*  BILITOT 0.7  PROT 5.5*  ALBUMIN 2.9*   No results for input(s): LIPASE, AMYLASE in the last 168 hours. No results for input(s): AMMONIA in the last 168 hours. Coagulation Profile: No results for input(s): INR, PROTIME in the last 168 hours. Cardiac Enzymes: No results for input(s): CKTOTAL, CKMB, CKMBINDEX, TROPONINI in the last 168 hours. BNP (last 3 results) No results for input(s): PROBNP in the last 8760 hours. HbA1C: No results for input(s): HGBA1C in the last 72 hours. CBG: Recent Labs  Lab 06/12/21 0739 06/12/21 1211 06/12/21 1635  GLUCAP 80 97 120*   Lipid Profile: No results for input(s): CHOL, HDL, LDLCALC,  TRIG, CHOLHDL, LDLDIRECT in the last 72 hours. Thyroid Function Tests: No results for input(s): TSH, T4TOTAL, FREET4, T3FREE, THYROIDAB in the last 72 hours. Anemia Panel: No results for input(s): VITAMINB12, FOLATE, FERRITIN, TIBC, IRON, RETICCTPCT in the last 72 hours. Urine analysis:    Component Value Date/Time   COLORURINE YELLOW 06/12/2021 0422   APPEARANCEUR HAZY (A) 06/12/2021 0422   LABSPEC 1.009 06/12/2021 0422   PHURINE 5.0 06/12/2021 0422   GLUCOSEU NEGATIVE 06/12/2021 0422   HGBUR NEGATIVE 06/12/2021 0422   BILIRUBINUR NEGATIVE 06/12/2021 0422   KETONESUR NEGATIVE 06/12/2021 0422   PROTEINUR 100 (A) 06/12/2021 0422   NITRITE NEGATIVE 06/12/2021 0422   LEUKOCYTESUR NEGATIVE 06/12/2021 0422   Sepsis Labs: _0 (procalcitonin:4,lacticacidven:4)  ) Recent Results (from the past 240 hour(s))  Resp Panel by RT-PCR (Flu A&B, Covid) Nasopharyngeal Swab     Status: None   Collection Time: 06/11/21 11:28 PM   Specimen: Nasopharyngeal Swab; Nasopharyngeal(NP) swabs in vial transport medium  Result Value Ref Range Status   SARS Coronavirus 2 by RT PCR NEGATIVE NEGATIVE Final    Comment: (NOTE) SARS-CoV-2 target nucleic acids are NOT DETECTED.  The SARS-CoV-2 RNA is generally detectable in upper respiratory specimens during the acute phase of infection. The lowest concentration of SARS-CoV-2 viral copies this assay can detect is 138 copies/mL. A negative result does not preclude SARS-Cov-2 infection and should not be used as the sole basis for treatment or other patient management decisions. A negative result may occur with  improper specimen collection/handling, submission of specimen other than nasopharyngeal swab, presence of viral mutation(s) within the areas targeted by this assay, and inadequate number of viral copies(<138 copies/mL). A negative result must be combined with clinical observations, patient history, and epidemiological information. The expected  result is Negative.  Fact Sheet for Patients:  EntrepreneurPulse.com.au  Fact Sheet for Healthcare Providers:  IncredibleEmployment.be  This test is no t yet approved or cleared by the [place] FDA and  has been authorized for detection and/or diagnosis of SARS-CoV-2 by FDA under an Emergency Use Authorization (EUA). This EUA will remain  in effect (meaning this test can be used) for the duration of the COVID-19 declaration under Section 564(b)(1) of the Act, 21 U.S.C.section 360bbb-3(b)(1), unless the authorization is terminated  or revoked sooner.       Influenza A by PCR NEGATIVE NEGATIVE Final   Influenza B by PCR NEGATIVE NEGATIVE Final    Comment: (NOTE) The Xpert Xpress SARS-CoV-2/FLU/RSV plus assay is intended as an aid in the diagnosis of influenza from Nasopharyngeal swab specimens  and should not be used as a sole basis for treatment. Nasal washings and aspirates are unacceptable for Xpert Xpress SARS-CoV-2/FLU/RSV testing.  Fact Sheet for Patients: EntrepreneurPulse.com.au  Fact Sheet for Healthcare Providers: IncredibleEmployment.be  This test is not yet approved or cleared by the [place] FDA and has been authorized for detection and/or diagnosis of SARS-CoV-2 by FDA under an Emergency Use Authorization (EUA). This EUA will remain in effect (meaning this test can be used) for the duration of the COVID-19 declaration under Section 564(b)(1) of the Act, 21 U.S.C. section 360bbb-3(b)(1), unless the authorization is terminated or revoked.  Performed at [facility], [place] [address], [place], [place] [address]          Radiology Studies: DG Chest 2 View  Result Date: 06/11/2021 CLINICAL DATA:  Shortness of breath. Bilateral lower extremity swelling EXAM: CHEST - 2 VIEW COMPARISON:  05/02/2017 chest radiograph.  Chest CT 12/30/2020 FINDINGS: Mild hyperinflation. Midline  trachea. Patient rotated minimally left. Mild cardiomegaly. Atherosclerosis in the transverse aorta. Small left pleural effusion. No pneumothorax. No congestive failure. Left greater than right base airspace disease. IMPRESSION: Small left pleural effusion with adjacent airspace disease, atelectasis or pneumonia. Minimal right base subsegmental atelectasis. Cardiomegaly without congestive failure. Aortic Atherosclerosis (ICD10-I70.0). Electronically Signed   By: Abigail Miyamoto M.D.   On: 06/11/2021 19:54   ECHOCARDIOGRAM COMPLETE  Result Date: 06/12/2021    ECHOCARDIOGRAM REPORT   Patient Name:   Leslie Reeves Date of Exam: 06/12/2021 Medical Rec #:  [id number]      Height:       61.6 in Accession #:    [id number]     Weight:       86.0 lb Date of Birth:  March 21, 1941       BSA:          1.329 m Patient Age:    51 years       BP:           165/62 mmHg Patient Gender: F              HR:           66 bpm. Exam Location:  Inpatient Procedure: 2D Echo Indications:    acute diastolic chf  History:        Patient has no prior history of Echocardiogram examinations.                 Risk Factors:Diabetes and Hypertension.  Sonographer:    Johny Chess RDCS Referring Phys: [place]  1. Left ventricular ejection fraction, by estimation, is 60 to 65%. The left ventricle has normal function. The left ventricle has no regional wall motion abnormalities. Left ventricular diastolic parameters are consistent with Grade I diastolic dysfunction (impaired relaxation).  2. Right ventricular systolic function is normal. The right ventricular size is normal. There is moderately elevated pulmonary artery systolic pressure.  3. The mitral valve is normal in structure. Mild mitral valve regurgitation. No evidence of mitral stenosis.  4. Tricuspid valve regurgitation is mild to moderate.  5. The aortic valve is tricuspid. Aortic valve regurgitation is not visualized. No aortic stenosis is present.  6. The inferior  vena cava is normal in size with greater than 50% respiratory variability, suggesting right atrial pressure of 3 mmHg. Comparison(s): No prior Echocardiogram. FINDINGS  Left Ventricle: Left ventricular ejection fraction, by estimation, is 60 to 65%. The left ventricle has normal function. The left ventricle has no regional  wall motion abnormalities. The left ventricular internal cavity size was normal in size. There is  no concentric left ventricular hypertrophy save for the basal-septal segment. Left ventricular diastolic parameters are consistent with Grade I diastolic dysfunction (impaired relaxation). Right Ventricle: The right ventricular size is normal. No increase in right ventricular wall thickness. Right ventricular systolic function is normal. There is moderately elevated pulmonary artery systolic pressure. The tricuspid regurgitant velocity is 3.30 m/s, and with an assumed right atrial pressure of 3 mmHg, the estimated right ventricular systolic pressure is 58.5 mmHg. Left Atrium: Left atrial size was normal in size. Right Atrium: Right atrial size was normal in size. Pericardium: There is no evidence of pericardial effusion. Mitral Valve: The mitral valve is normal in structure. Mild mitral valve regurgitation. No evidence of mitral valve stenosis. Tricuspid Valve: The tricuspid valve is normal in structure. Tricuspid valve regurgitation is mild to moderate. No evidence of tricuspid stenosis. Aortic Valve: The aortic valve is tricuspid. Aortic valve regurgitation is not visualized. No aortic stenosis is present. Pulmonic Valve: The pulmonic valve was normal in structure. Pulmonic valve regurgitation is not visualized. No evidence of pulmonic stenosis. Aorta: The aortic root and ascending aorta are structurally normal, with no evidence of dilitation. Venous: The inferior vena cava is normal in size with greater than 50% respiratory variability, suggesting right atrial pressure of 3 mmHg. IAS/Shunts: The  atrial septum is grossly normal.  LEFT VENTRICLE PLAX 2D LVIDd:         4.60 cm  Diastology LVIDs:         2.70 cm  LV e' medial:    7.18 cm/s LV PW:         1.10 cm  LV E/e' medial:  13.5 LV IVS:        0.90 cm  LV e' lateral:   10.80 cm/s LVOT diam:     1.60 cm  LV E/e' lateral: 9.0 LV SV:         53 LV SV Index:   40 LVOT Area:     2.01 cm  RIGHT VENTRICLE             IVC RV Basal diam:  3.40 cm     IVC diam: 1.50 cm RV S prime:     12.90 cm/s TAPSE (M-mode): 1.9 cm LEFT ATRIUM           Index       RIGHT ATRIUM           Index LA diam:      3.30 cm 2.48 cm/m  RA Area:     14.50 cm LA Vol (A2C): 46.6 ml 35.06 ml/m RA Volume:   37.40 ml  28.13 ml/m LA Vol (A4C): 18.2 ml 13.69 ml/m  AORTIC VALVE LVOT Vmax:   112.00 cm/s LVOT Vmean:  73.300 cm/s LVOT VTI:    0.263 m  AORTA Ao Root diam: 3.00 cm Ao Asc diam:  2.70 cm MITRAL VALVE                TRICUSPID VALVE MV Area (PHT): 3.17 cm     TR Peak grad:   43.6 mmHg MV Decel Time: 239 msec     TR Vmax:        330.00 cm/s MV E velocity: 96.70 cm/s MV A velocity: 104.00 cm/s  SHUNTS MV E/A ratio:  0.93         Systemic VTI:  0.26 m  Systemic Diam: 1.60 cm Rudean Haskell MD Electronically signed by Rudean Haskell MD Signature Date/Time: 06/12/2021/1:00:04 PM    Final         Scheduled Meds:  amLODipine  10 mg Oral Daily   aspirin  325 mg Oral Daily   enoxaparin (LOVENOX) injection  20 mg Subcutaneous Q24H   furosemide  60 mg Intravenous BID   insulin aspart  0-9 Units Subcutaneous TID WC   isosorbide-hydrALAZINE  1 tablet Oral TID   levothyroxine  37.5 mcg Oral Q0600   megestrol  625 mg Oral QHS   mirtazapine  15 mg Oral QHS   mometasone-formoterol  2 puff Inhalation BID   rosuvastatin  20 mg Oral Daily   Continuous Infusions:   LOS: 0 days    Time spent: 20 minutes    Edwin Dada, MD [facility] 06/12/2021, 5:04 PM     Please page though [place] or Epic secure chat:  For password,  contact charge nurse

## 2021-06-13 DIAGNOSIS — I509 Heart failure, unspecified: Secondary | ICD-10-CM | POA: Diagnosis not present

## 2021-06-13 DIAGNOSIS — N1831 Chronic kidney disease, stage 3a: Secondary | ICD-10-CM

## 2021-06-13 DIAGNOSIS — N1832 Chronic kidney disease, stage 3b: Secondary | ICD-10-CM

## 2021-06-13 LAB — BASIC METABOLIC PANEL
Anion gap: 9 (ref 5–15)
BUN: 34 mg/dL — ABNORMAL HIGH (ref 8–23)
CO2: 26 mmol/L (ref 22–32)
Calcium: 8.5 mg/dL — ABNORMAL LOW (ref 8.9–10.3)
Chloride: 107 mmol/L (ref 98–111)
Creatinine, Ser: 1.97 mg/dL — ABNORMAL HIGH (ref 0.44–1.00)
GFR, Estimated: 25 mL/min — ABNORMAL LOW (ref >60)
Glucose, Bld: 80 mg/dL (ref 70–99)
Potassium: 3.9 mmol/L (ref 3.5–5.1)
Sodium: 142 mmol/L (ref 135–145)

## 2021-06-13 LAB — GLUCOSE, CAPILLARY
Glucose-Capillary: 83 mg/dL (ref 70–99)
Glucose-Capillary: 139 mg/dL — ABNORMAL HIGH (ref 70–99)
Glucose-Capillary: 63 mg/dL — ABNORMAL LOW (ref 70–99)
Glucose-Capillary: 119 mg/dL — ABNORMAL HIGH (ref 70–99)

## 2021-06-13 MED ORDER — FUROSEMIDE 20 MG PO TABS: 20.0000 mg | ORAL_TABLET | ORAL | 11 refills | Status: DC

## 2021-06-13 MED ORDER — POTASSIUM CHLORIDE CRYS ER 20 MEQ PO TBCR: 20.0000 meq | EXTENDED_RELEASE_TABLET | ORAL | 0 refills | Status: DC

## 2021-06-13 MED ORDER — ISOSORB DINITRATE-HYDRALAZINE 20-37.5 MG PO TABS: 1.0000 | ORAL_TABLET | Freq: Three times a day (TID) | ORAL | 0 refills | Status: AC

## 2021-06-13 NOTE — Discharge Summary (Signed)
Physician Discharge Summary  Leslie Reeves YBR:[id number] DOB: Jun 30, 1941 DOA: 06/11/2021  PCP: [name], FNP  Admit date: 06/11/2021 Discharge date: 06/13/2021  Admitted From: home  Disposition:  home   Recommendations for Outpatient Follow-up:  F/u on Lasix dosing, recheck Bmet in 1-2 wks  Home Health:  ordered  Discharge Condition:  stable   CODE STATUS:  full code   Diet recommendation:  heart healthy Consultations: none  Procedures/Studies: none   Discharge Diagnoses:  Principal Problem:   New onset of congestive heart failure ([place]) Active Problems:   AKI (acute kidney injury) ([place])   Chronic kidney disease (CKD) stage G3a/A1, moderately decreased glomerular filtration rate (GFR) between 45-59 mL/min/1.73 square meter and albuminuria creatinine ratio less than 30 mg/g ([facility])   Right thyroid nodule   Decreased appetite   Prediabetes   Essential hypertension   Solitary pulmonary nodule   Mild intermittent asthma   DM2 (diabetes mellitus, type 2) ([place])     Brief Summary: This is an 80 year old female with hypertension, diabetes mellitus, CKD stage IIIa who presented to the hospital with dyspnea on exertion and swelling of her lower extremities which had been steadily progressing over 1 month.  She was admitted for exacerbation of acute heart failure and started on Lasix.  Hospital Course:  Acute diastolic heart failure- Hypertensive heart disease - On admission she was noted to have an elevated JVD, lower extremity edema along with complaints of dyspnea on exertion - She was started on Lasix and today states that she is feeling much better and feels that she is at her baseline - As she has been losing weight at home and appears to have a variable oral intake, I do not want to place her on a very large dose of Lasix when discharging today (20 mg QOD) - I have recommended that she follow her weights closely and follow closely with her primary care physician to see if  her Lasix dose will need to be adjusted  CKD stage IIIa with mild AKI on admission - Creatinine appears to range from 1.2-1.4 - Creatinine on admission was 1.86, improved to 1.79 and is 1.97 today - Continue to follow closely as outpatient- dc Benicar- have started Bidil  Ongoing weight loss with failure to thrive - She is on Megace and mirtazapine as outpatient which we have continued  Prediabetes - She is on metformin as an outpatient-A1c quite controlled at 5.1 - With ongoing weight loss, poor oral intake and rising renal function, I will hold metformin for now and allow her to follow-up with her primary care physician with a repeat A1c to be planned in 1 month  Hypothyroidism - Continue levothyroxine  COPD - No flareup noted-as mentioned above, the patient states that her shortness of breath has improved significantly since being in the hospital - Continue outpatient inhalers  Anemia of chronic disease  -stable-hemoglobin ranges between 11-12    Discharge Exam: Vitals:   06/13/21 0735 06/13/21 0800  BP:  (!) 149/61  Pulse:    Resp: 18   Temp: 99.2 F (37.3 C)   SpO2:     Vitals:   06/13/21 0551 06/13/21 0733 06/13/21 0735 06/13/21 0800  BP:    (!) 149/61  Pulse:      Resp:   18   Temp:   99.2 F (37.3 C)   TempSrc:   Oral   SpO2:  91%    Weight: 44.5 kg     Height:  General: Pt is alert, awake, not in acute distress Cardiovascular: RRR, S1/S2 +, no rubs, no gallops Respiratory: CTA bilaterally, no wheezing, no rhonchi Abdominal: Soft, NT, ND, bowel sounds + Extremities: no edema, no cyanosis   Discharge Instructions  Discharge Instructions     (HEART FAILURE PATIENTS) Call MD:  Anytime you have any of the following symptoms: 1) 3 pound weight gain in 24 hours or 5 pounds in 1 week 2) shortness of breath, with or without a dry hacking cough 3) swelling in the hands, feet or stomach 4) if you have to sleep on extra pillows at night in order to  breathe.   Complete by: As directed    Diet - low sodium heart healthy   Complete by: As directed    Increase activity slowly   Complete by: As directed       Allergies as of 06/13/2021       Reactions   Penicillin G Rash   Penicillins Rash        Medication List     STOP taking these medications    metFORMIN 500 MG tablet Commonly known as: GLUCOPHAGE   olmesartan 20 MG tablet Commonly known as: BENICAR       TAKE these medications    Accu-Chek Softclix Lancets lancets Use as instructed   albuterol 108 (90 Base) MCG/ACT inhaler Commonly known as: VENTOLIN HFA INHALE 2 PUFFS BY MOUTH 4 TIMES A DAY What changed:  how much to take how to take this when to take this reasons to take this additional instructions   amLODipine 10 MG tablet Commonly known as: NORVASC TAKE 1 TABLET BY MOUTH EVERY DAY   aspirin 325 MG tablet Commonly known as: Bayer Aspirin Take 1 tablet (325 mg total) by mouth daily.   blood glucose meter kit and supplies Kit Dispense based on patient and insurance preference. Use up to four times daily as directed. (FOR ICD-9 250.00, 250.01).   diclofenac sodium 1 % Gel Commonly known as: VOLTAREN Apply 2 g topically 4 (four) times daily. What changed:  when to take this reasons to take this   fluticasone-salmeterol 100-50 MCG/ACT Aepb Commonly known as: Wixela Inhub TAKE 1 PUFF BY MOUTH EVERY 12 HOURS IN THE MORNING AND IN THE EVENING   furosemide 20 MG tablet Commonly known as: Lasix Take 1 tablet (20 mg total) by mouth every other day.   glucose blood test strip Commonly known as: Accu-Chek Aviva Plus Use as instructed   isosorbide-hydrALAZINE 20-37.5 MG tablet Commonly known as: BIDIL Take 1 tablet by mouth 3 (three) times daily.   levothyroxine 25 MCG tablet Commonly known as: SYNTHROID TAKE 1 AND 1/2 TABLETS BY MOUTH DAILY What changed: when to take this   megestrol 625 MG/5ML suspension Commonly known as: Megace  ES Take 5 mLs (625 mg total) by mouth daily. What changed: when to take this   mirtazapine 15 MG tablet Commonly known as: REMERON TAKE 1 TABLET BY MOUTH EVERYDAY AT BEDTIME What changed: See the new instructions.   potassium chloride SA 20 MEQ tablet Commonly known as: KLOR-CON Take 1 tablet (20 mEq total) by mouth every other day. Must be taken with Lasix/ Furosemide   rosuvastatin 20 MG tablet Commonly known as: CRESTOR TAKE 1 TABLET BY MOUTH EVERY DAY   Vitamin D (Ergocalciferol) 1.25 MG (50000 UNIT) Caps capsule Commonly known as: DRISDOL Take 50,000 Units by mouth 2 (two) times a week.        Allergies  Allergen Reactions   Penicillin G Rash   Penicillins Rash      DG Chest 2 View  Result Date: 06/11/2021 CLINICAL DATA:  Shortness of breath. Bilateral lower extremity swelling EXAM: CHEST - 2 VIEW COMPARISON:  05/02/2017 chest radiograph.  Chest CT 12/30/2020 FINDINGS: Mild hyperinflation. Midline trachea. Patient rotated minimally left. Mild cardiomegaly. Atherosclerosis in the transverse aorta. Small left pleural effusion. No pneumothorax. No congestive failure. Left greater than right base airspace disease. IMPRESSION: Small left pleural effusion with adjacent airspace disease, atelectasis or pneumonia. Minimal right base subsegmental atelectasis. Cardiomegaly without congestive failure. Aortic Atherosclerosis (ICD10-I70.0). Electronically Signed   By: Abigail Miyamoto M.D.   On: 06/11/2021 19:54   ECHOCARDIOGRAM COMPLETE  Result Date: 06/12/2021    ECHOCARDIOGRAM REPORT   Patient Name:   Kenika ANIRA SENEGAL Date of Exam: 06/12/2021 Medical Rec #:  [id number]      Height:       61.6 in Accession #:    [id number]     Weight:       86.0 lb Date of Birth:  10-15-1940       BSA:          1.329 m Patient Age:    22 years       BP:           165/62 mmHg Patient Gender: F              HR:           66 bpm. Exam Location:  Inpatient Procedure: 2D Echo Indications:    acute diastolic chf   History:        Patient has no prior history of Echocardiogram examinations.                 Risk Factors:Diabetes and Hypertension.  Sonographer:    Johny Chess RDCS Referring Phys: [place]  1. Left ventricular ejection fraction, by estimation, is 60 to 65%. The left ventricle has normal function. The left ventricle has no regional wall motion abnormalities. Left ventricular diastolic parameters are consistent with Grade I diastolic dysfunction (impaired relaxation).  2. Right ventricular systolic function is normal. The right ventricular size is normal. There is moderately elevated pulmonary artery systolic pressure.  3. The mitral valve is normal in structure. Mild mitral valve regurgitation. No evidence of mitral stenosis.  4. Tricuspid valve regurgitation is mild to moderate.  5. The aortic valve is tricuspid. Aortic valve regurgitation is not visualized. No aortic stenosis is present.  6. The inferior vena cava is normal in size with greater than 50% respiratory variability, suggesting right atrial pressure of 3 mmHg. Comparison(s): No prior Echocardiogram. FINDINGS  Left Ventricle: Left ventricular ejection fraction, by estimation, is 60 to 65%. The left ventricle has normal function. The left ventricle has no regional wall motion abnormalities. The left ventricular internal cavity size was normal in size. There is  no concentric left ventricular hypertrophy save for the basal-septal segment. Left ventricular diastolic parameters are consistent with Grade I diastolic dysfunction (impaired relaxation). Right Ventricle: The right ventricular size is normal. No increase in right ventricular wall thickness. Right ventricular systolic function is normal. There is moderately elevated pulmonary artery systolic pressure. The tricuspid regurgitant velocity is 3.30 m/s, and with an assumed right atrial pressure of 3 mmHg, the estimated right ventricular systolic pressure is 56.3 mmHg.  Left Atrium: Left atrial size was normal in size. Right Atrium: Right atrial size was normal in size.  Pericardium: There is no evidence of pericardial effusion. Mitral Valve: The mitral valve is normal in structure. Mild mitral valve regurgitation. No evidence of mitral valve stenosis. Tricuspid Valve: The tricuspid valve is normal in structure. Tricuspid valve regurgitation is mild to moderate. No evidence of tricuspid stenosis. Aortic Valve: The aortic valve is tricuspid. Aortic valve regurgitation is not visualized. No aortic stenosis is present. Pulmonic Valve: The pulmonic valve was normal in structure. Pulmonic valve regurgitation is not visualized. No evidence of pulmonic stenosis. Aorta: The aortic root and ascending aorta are structurally normal, with no evidence of dilitation. Venous: The inferior vena cava is normal in size with greater than 50% respiratory variability, suggesting right atrial pressure of 3 mmHg. IAS/Shunts: The atrial septum is grossly normal.  LEFT VENTRICLE PLAX 2D LVIDd:         4.60 cm  Diastology LVIDs:         2.70 cm  LV e' medial:    7.18 cm/s LV PW:         1.10 cm  LV E/e' medial:  13.5 LV IVS:        0.90 cm  LV e' lateral:   10.80 cm/s LVOT diam:     1.60 cm  LV E/e' lateral: 9.0 LV SV:         53 LV SV Index:   40 LVOT Area:     2.01 cm  RIGHT VENTRICLE             IVC RV Basal diam:  3.40 cm     IVC diam: 1.50 cm RV S prime:     12.90 cm/s TAPSE (M-mode): 1.9 cm LEFT ATRIUM           Index       RIGHT ATRIUM           Index LA diam:      3.30 cm 2.48 cm/m  RA Area:     14.50 cm LA Vol (A2C): 46.6 ml 35.06 ml/m RA Volume:   37.40 ml  28.13 ml/m LA Vol (A4C): 18.2 ml 13.69 ml/m  AORTIC VALVE LVOT Vmax:   112.00 cm/s LVOT Vmean:  73.300 cm/s LVOT VTI:    0.263 m  AORTA Ao Root diam: 3.00 cm Ao Asc diam:  2.70 cm MITRAL VALVE                TRICUSPID VALVE MV Area (PHT): 3.17 cm     TR Peak grad:   43.6 mmHg MV Decel Time: 239 msec     TR Vmax:        330.00 cm/s MV E  velocity: 96.70 cm/s MV A velocity: 104.00 cm/s  SHUNTS MV E/A ratio:  0.93         Systemic VTI:  0.26 m                             Systemic Diam: 1.60 cm Rudean Haskell MD Electronically signed by Rudean Haskell MD Signature Date/Time: 06/12/2021/1:00:04 PM    Final      The results of significant diagnostics from this hospitalization (including imaging, microbiology, ancillary and laboratory) are listed below for reference.     Microbiology: Recent Results (from the past 240 hour(s))  Resp Panel by RT-PCR (Flu A&B, Covid) Nasopharyngeal Swab     Status: None   Collection Time: 06/11/21 11:28 PM   Specimen: Nasopharyngeal Swab; Nasopharyngeal(NP) swabs in vial transport medium  Result Value Ref Range Status   SARS Coronavirus 2 by RT PCR NEGATIVE NEGATIVE Final    Comment: (NOTE) SARS-CoV-2 target nucleic acids are NOT DETECTED.  The SARS-CoV-2 RNA is generally detectable in upper respiratory specimens during the acute phase of infection. The lowest concentration of SARS-CoV-2 viral copies this assay can detect is 138 copies/mL. A negative result does not preclude SARS-Cov-2 infection and should not be used as the sole basis for treatment or other patient management decisions. A negative result may occur with  improper specimen collection/handling, submission of specimen other than nasopharyngeal swab, presence of viral mutation(s) within the areas targeted by this assay, and inadequate number of viral copies(<138 copies/mL). A negative result must be combined with clinical observations, patient history, and epidemiological information. The expected result is Negative.  Fact Sheet for Patients:  EntrepreneurPulse.com.au  Fact Sheet for Healthcare Providers:  IncredibleEmployment.be  This test is no t yet approved or cleared by the [place] FDA and  has been authorized for detection and/or diagnosis of SARS-CoV-2 by FDA under an  Emergency Use Authorization (EUA). This EUA will remain  in effect (meaning this test can be used) for the duration of the COVID-19 declaration under Section 564(b)(1) of the Act, 21 U.S.C.section 360bbb-3(b)(1), unless the authorization is terminated  or revoked sooner.       Influenza A by PCR NEGATIVE NEGATIVE Final   Influenza B by PCR NEGATIVE NEGATIVE Final    Comment: (NOTE) The Xpert Xpress SARS-CoV-2/FLU/RSV plus assay is intended as an aid in the diagnosis of influenza from Nasopharyngeal swab specimens and should not be used as a sole basis for treatment. Nasal washings and aspirates are unacceptable for Xpert Xpress SARS-CoV-2/FLU/RSV testing.  Fact Sheet for Patients: EntrepreneurPulse.com.au  Fact Sheet for Healthcare Providers: IncredibleEmployment.be  This test is not yet approved or cleared by the [place] FDA and has been authorized for detection and/or diagnosis of SARS-CoV-2 by FDA under an Emergency Use Authorization (EUA). This EUA will remain in effect (meaning this test can be used) for the duration of the COVID-19 declaration under Section 564(b)(1) of the Act, 21 U.S.C. section 360bbb-3(b)(1), unless the authorization is terminated or revoked.  Performed at [facility], [place] [address], [place], [place] [address]      Labs: BNP (last 3 results) Recent Labs    01/20/21 1134 06/11/21 1921  BNP 47.3 797.2*   Basic Metabolic Panel: Recent Labs  Lab 06/11/21 1921 06/12/21 0428 06/13/21 0428  NA 144 145 142  K 4.3 4.0 3.9  CL 113* 107 107  CO2 _0 GLUCOSE 93 91 80  BUN 32* 33* 34*  CREATININE 1.86* 1.79* 1.97*  CALCIUM 9.2 9.5 8.5*   Liver Function Tests: Recent Labs  Lab 06/12/21 0428  AST 19  ALT 13  ALKPHOS 34*  BILITOT 0.7  PROT 5.5*  ALBUMIN 2.9*   No results for input(s): LIPASE, AMYLASE in the last 168 hours. No results for input(s): AMMONIA in the last 168  hours. CBC: Recent Labs  Lab 06/11/21 1921  WBC 5.4  HGB 11.1*  HCT 36.2  MCV 83.2  PLT 286   Cardiac Enzymes: No results for input(s): CKTOTAL, CKMB, CKMBINDEX, TROPONINI in the last 168 hours. BNP: Invalid input(s): POCBNP CBG: Recent Labs  Lab 06/12/21 1635 06/12/21 2139 06/13/21 0558 06/13/21 0816 06/13/21 1149  GLUCAP 120* 107* 83 139* 63*   D-Dimer No results for input(s): DDIMER in the last 72 hours. Hgb A1c  No results for input(s): HGBA1C in the last 72 hours. Lipid Profile No results for input(s): CHOL, HDL, LDLCALC, TRIG, CHOLHDL, LDLDIRECT in the last 72 hours. Thyroid function studies No results for input(s): TSH, T4TOTAL, T3FREE, THYROIDAB in the last 72 hours.  Invalid input(s): FREET3 Anemia work up No results for input(s): VITAMINB12, FOLATE, FERRITIN, TIBC, IRON, RETICCTPCT in the last 72 hours. Urinalysis    Component Value Date/Time   COLORURINE YELLOW 06/12/2021 0422   APPEARANCEUR HAZY (A) 06/12/2021 0422   LABSPEC 1.009 06/12/2021 0422   PHURINE 5.0 06/12/2021 0422   GLUCOSEU NEGATIVE 06/12/2021 0422   HGBUR NEGATIVE 06/12/2021 0422   BILIRUBINUR NEGATIVE 06/12/2021 0422   KETONESUR NEGATIVE 06/12/2021 0422   PROTEINUR 100 (A) 06/12/2021 0422   NITRITE NEGATIVE 06/12/2021 0422   LEUKOCYTESUR NEGATIVE 06/12/2021 0422   Sepsis Labs Invalid input(s): PROCALCITONIN,  WBC,  LACTICIDVEN Microbiology Recent Results (from the past 240 hour(s))  Resp Panel by RT-PCR (Flu A&B, Covid) Nasopharyngeal Swab     Status: None   Collection Time: 06/11/21 11:28 PM   Specimen: Nasopharyngeal Swab; Nasopharyngeal(NP) swabs in vial transport medium  Result Value Ref Range Status   SARS Coronavirus 2 by RT PCR NEGATIVE NEGATIVE Final    Comment: (NOTE) SARS-CoV-2 target nucleic acids are NOT DETECTED.  The SARS-CoV-2 RNA is generally detectable in upper respiratory specimens during the acute phase of infection. The lowest concentration of SARS-CoV-2  viral copies this assay can detect is 138 copies/mL. A negative result does not preclude SARS-Cov-2 infection and should not be used as the sole basis for treatment or other patient management decisions. A negative result may occur with  improper specimen collection/handling, submission of specimen other than nasopharyngeal swab, presence of viral mutation(s) within the areas targeted by this assay, and inadequate number of viral copies(<138 copies/mL). A negative result must be combined with clinical observations, patient history, and epidemiological information. The expected result is Negative.  Fact Sheet for Patients:  EntrepreneurPulse.com.au  Fact Sheet for Healthcare Providers:  IncredibleEmployment.be  This test is no t yet approved or cleared by the [place] FDA and  has been authorized for detection and/or diagnosis of SARS-CoV-2 by FDA under an Emergency Use Authorization (EUA). This EUA will remain  in effect (meaning this test can be used) for the duration of the COVID-19 declaration under Section 564(b)(1) of the Act, 21 U.S.C.section 360bbb-3(b)(1), unless the authorization is terminated  or revoked sooner.       Influenza A by PCR NEGATIVE NEGATIVE Final   Influenza B by PCR NEGATIVE NEGATIVE Final    Comment: (NOTE) The Xpert Xpress SARS-CoV-2/FLU/RSV plus assay is intended as an aid in the diagnosis of influenza from Nasopharyngeal swab specimens and should not be used as a sole basis for treatment. Nasal washings and aspirates are unacceptable for Xpert Xpress SARS-CoV-2/FLU/RSV testing.  Fact Sheet for Patients: EntrepreneurPulse.com.au  Fact Sheet for Healthcare Providers: IncredibleEmployment.be  This test is not yet approved or cleared by the [place] FDA and has been authorized for detection and/or diagnosis of SARS-CoV-2 by FDA under an Emergency Use Authorization  (EUA). This EUA will remain in effect (meaning this test can be used) for the duration of the COVID-19 declaration under Section 564(b)(1) of the Act, 21 U.S.C. section 360bbb-3(b)(1), unless the authorization is terminated or revoked.  Performed at [facility], [place] [address] [place], [place] [address]      Time coordinating discharge in minutes: 65  SIGNED:   Eunice Blase  Wynelle Cleveland, MD  [facility] 06/13/2021, 12:01 PM

## 2021-06-13 NOTE — Progress Notes (Signed)
SATURATION QUALIFICATIONS: (This note is used to comply with regulatory documentation for home oxygen)  Patient Saturations on Room Air at Rest = 93%  Patient Saturations on Room Air while Ambulating = 88%  Patient Saturations on 2 Liters of oxygen while Ambulating = 94%  Please briefly explain why patient needs home oxygen:

## 2021-06-15 ENCOUNTER — Ambulatory Visit: Payer: Medicare Other | Admitting: Nurse Practitioner

## 2021-06-15 ENCOUNTER — Other Ambulatory Visit: Payer: Self-pay | Admitting: Nurse Practitioner

## 2021-06-15 ENCOUNTER — Telehealth: Payer: Self-pay

## 2021-06-15 NOTE — Telephone Encounter (Signed)
Transition Care Management Follow-up Telephone Call Date of discharge and from where: 06/13/2021 [facility]  How have you been since you were released from the hospital? pt is still resting, still a little weak, Any questions or concerns? No  Items Reviewed: Did the pt receive and understand the discharge instructions provided? Yes  Medications obtained and verified? Yes  Other? Yes  Any new allergies since your discharge? No  Dietary orders reviewed? Yes Do you have support at home? Yes   Home Care and Equipment/Supplies: Were home health services ordered? not applicable If so, what is the name of the agency? N/a  Has the agency set up a time to come to the patient's home? not applicable Were any new equipment or medical supplies ordered?  No What is the name of the medical supply agency? N/a Were you able to get the supplies/equipment? not applicable Do you have any questions related to the use of the equipment or supplies? No  Functional Questionnaire: (I = Independent and D = Dependent) ADLs: I/D  Bathing/Dressing- I/D  Meal Prep- I/D  Eating- I/D  Maintaining continence- I/D  Transferring/Ambulation- I/D  Managing Meds- I/D  Follow up appointments reviewed:  PCP Hospital f/u appt confirmed? Yes  Scheduled to see Raman Ghumman on 06/18/2021 @ [facility]. [facility] f/u appt confirmed? No Scheduled to see n/a on n/a @ n/a. Are transportation arrangements needed? No  If their condition worsens, is the pt aware to call PCP or go to the Emergency Dept.? Yes Was the patient provided with contact information for the PCP's office or ED? Yes Was to pt encouraged to call back with questions or concerns? Yes

## 2021-06-18 ENCOUNTER — Encounter: Payer: Self-pay | Admitting: Nurse Practitioner

## 2021-06-18 ENCOUNTER — Inpatient Hospital Stay: Payer: Medicare Other | Admitting: Nurse Practitioner

## 2021-06-18 ENCOUNTER — Other Ambulatory Visit: Payer: Self-pay

## 2021-06-18 ENCOUNTER — Ambulatory Visit (INDEPENDENT_AMBULATORY_CARE_PROVIDER_SITE_OTHER): Payer: Medicare Other | Admitting: Nurse Practitioner

## 2021-06-18 VITALS — BP 126/58 | HR 72 | Temp 98.4°F | Ht 61.0 in

## 2021-06-18 DIAGNOSIS — I5031 Acute diastolic (congestive) heart failure: Secondary | ICD-10-CM

## 2021-06-18 DIAGNOSIS — E039 Hypothyroidism, unspecified: Secondary | ICD-10-CM | POA: Diagnosis not present

## 2021-06-18 DIAGNOSIS — N1831 Chronic kidney disease, stage 3a: Secondary | ICD-10-CM | POA: Diagnosis not present

## 2021-06-18 DIAGNOSIS — R627 Adult failure to thrive: Secondary | ICD-10-CM

## 2021-06-18 DIAGNOSIS — R7303 Prediabetes: Secondary | ICD-10-CM | POA: Diagnosis not present

## 2021-06-18 DIAGNOSIS — D638 Anemia in other chronic diseases classified elsewhere: Secondary | ICD-10-CM

## 2021-06-18 NOTE — Progress Notes (Signed)
I,Katawbba Wiggins,acting as a Education administrator for [employer], NP.,have documented all relevant documentation on the behalf of [employer], NP,as directed by  [name], NP while in the presence of [name], NP.  This visit occurred during the SARS-CoV-2 public health emergency.  Safety protocols were in place, including screening questions prior to the visit, additional usage of staff PPE, and extensive cleaning of exam room while observing appropriate contact time as indicated for disinfecting solutions.  Subjective:     Patient ID: Leslie Reeves , female    DOB: [birth year] , 80 y.o.   MRN: [id number]   Chief Complaint  Patient presents with   Hospitalization Follow-up   HPI  She is here for a hospital follow up. She has a lot of shortness of breath. Gets tired really easily. She was admitted at 9/15-9/17. She was admitted for acute diastolic CHF. She continues to have shortness of breath. She does not have a cardiologist. She sees the nephrologist for the first time on 9/28. She does not have any other questions.     Past Medical History:  Diagnosis Date   Allergy    Diabetes mellitus without complication (HCC)    Hypertension      Family History  Problem Relation Age of Onset   Heart disease Other    Stroke Other    Hypertension Other    COPD Other    Cancer Other    Asthma Other    Breast cancer Neg Hx     Current Outpatient Medications:    Accu-Chek Softclix Lancets lancets, Use as instructed, Disp: 100 each, Rfl: 12   albuterol (VENTOLIN HFA) 108 (90 Base) MCG/ACT inhaler, INHALE 2 PUFFS BY MOUTH 4 TIMES A DAY (Patient taking differently: Inhale 2 puffs into the lungs every 6 (six) hours as needed for shortness of breath or wheezing.), Disp: 20.1 each, Rfl: 3   amLODipine (NORVASC) 10 MG tablet, TAKE 1 TABLET BY MOUTH EVERY DAY, Disp: 90 tablet, Rfl: 1   aspirin (BAYER ASPIRIN) 325 MG tablet, Take 1 tablet (325 mg total) by mouth daily., Disp: 100  tablet, Rfl: 3   blood glucose meter kit and supplies KIT, Dispense based on patient and insurance preference. Use up to four times daily as directed. (FOR ICD-9 250.00, 250.01)., Disp: 1 each, Rfl: 0   diclofenac sodium (VOLTAREN) 1 % GEL, Apply 2 g topically 4 (four) times daily. (Patient taking differently: Apply 2 g topically 4 (four) times daily as needed (pain).), Disp: 100 g, Rfl: 1   fluticasone-salmeterol (WIXELA INHUB) 100-50 MCG/ACT AEPB, TAKE 1 PUFF BY MOUTH EVERY 12 HOURS IN THE MORNING AND IN THE EVENING, Disp: 180 each, Rfl: 11   furosemide (LASIX) 20 MG tablet, Take 1 tablet (20 mg total) by mouth every other day., Disp: 30 tablet, Rfl: 11   glucose blood (ACCU-CHEK AVIVA PLUS) test strip, Use as instructed, Disp: 300 each, Rfl: 3   isosorbide-hydrALAZINE (BIDIL) 20-37.5 MG tablet, Take 1 tablet by mouth 3 (three) times daily., Disp: 30 tablet, Rfl: 0   levothyroxine (SYNTHROID) 25 MCG tablet, Take 1.5 tablets (37.5 mcg total) by mouth daily before breakfast., Disp: 135 tablet, Rfl: 2   megestrol (MEGACE ES) 625 MG/5ML suspension, Take 5 mLs (625 mg total) by mouth daily. (Patient taking differently: Take 625 mg by mouth at bedtime.), Disp: 150 mL, Rfl: 1   mirtazapine (REMERON) 15 MG tablet, TAKE 1 TABLET BY MOUTH EVERYDAY AT BEDTIME (Patient taking differently: Take 15 mg by mouth  at bedtime.), Disp: 90 tablet, Rfl: 1   mometasone-formoterol (DULERA) 100-5 MCG/ACT AERO, Inhale 2 puffs into the lungs 2 (two) times daily., Disp: , Rfl:    potassium chloride SA (KLOR-CON) 20 MEQ tablet, Take 1 tablet (20 mEq total) by mouth every other day. Must be taken with Lasix/ Furosemide, Disp: 30 tablet, Rfl: 0   rosuvastatin (CRESTOR) 20 MG tablet, TAKE 1 TABLET BY MOUTH EVERY DAY (Patient taking differently: Take 20 mg by mouth daily.), Disp: 90 tablet, Rfl: 0   Vitamin D, Ergocalciferol, (DRISDOL) 1.25 MG (50000 UNIT) CAPS capsule, TAKE ONE CAPSULE BY MOUTH TWICE A WEEK, Disp: 24 capsule, Rfl: 1    Allergies  Allergen Reactions   Penicillin G Rash   Penicillins Rash     Review of Systems  Constitutional:  Positive for appetite change. Negative for chills, fatigue and fever.  HENT:  Negative for congestion, rhinorrhea, sinus pressure and sinus pain.   Respiratory:  Negative for cough, shortness of breath and wheezing.   Cardiovascular:  Negative for chest pain and palpitations.  Gastrointestinal:  Negative for constipation and diarrhea.  Endocrine: Negative for polydipsia, polyphagia and polyuria.  Musculoskeletal:  Negative for arthralgias and myalgias.  Neurological:  Negative for dizziness, weakness, numbness and headaches.    Today's Vitals   06/18/21 1140  BP: (!) 126/58  Pulse: 72  Temp: 98.4 F (36.9 C)  Height: 5' 1" (1.549 m)   Body mass index is 18.54 kg/m.  Wt Readings from Last 3 Encounters:  06/13/21 98 lb 1.7 oz (44.5 kg)  04/23/21 86 lb 9.6 oz (39.3 kg)  04/01/21 87 lb 4.8 oz (39.6 kg)    BP Readings from Last 3 Encounters:  06/18/21 (!) 126/58  06/13/21 (!) 145/58  04/23/21 122/74    Objective:  Physical Exam Constitutional:      Appearance: Normal appearance.  HENT:     Head: Normocephalic and atraumatic.  Cardiovascular:     Rate and Rhythm: Normal rate and regular rhythm.     Pulses: Normal pulses.     Heart sounds: Normal heart sounds. No murmur heard. Pulmonary:     Effort: Pulmonary effort is normal. No respiratory distress.     Breath sounds: Normal breath sounds. No wheezing.  Musculoskeletal:        General: No swelling or tenderness.  Skin:    General: Skin is warm and dry.     Capillary Refill: Capillary refill takes less than 2 seconds.  Neurological:     Mental Status: She is alert and oriented to person, place, and time.        Assessment And Plan:     1. Acute diastolic congestive heart failure ([place]) -She as admitted in the hospital for lower ext. Edema, and dyspnea on exertion.  -will do another BMP today   -Currently on Lasix 20 mg  -Advise if more than 3 pd weight gain in 24 hours or 5 pds in 1 week -Advised patient to go to the ED if she develops SOB  -Advised patient to elevate her legs; sleep on recliner  -Activity as tolerated; rest when needed.  - BMP8+eGFR  2. Chronic kidney disease (CKD) stage G3a/A1, moderately decreased glomerular filtration rate (GFR) between 45-59 mL/min/1.73 square meter and albuminuria creatinine ratio less than 30 mg/g (HCC) BMP with GFR today  -Continue Bidil -Follow up with nephrologist on 9/28   3. Adult failure to thrive -Continue the Remeron.   4. Acquired hypothyroidism -Continue the levothyroxine  5. Anemia of chronic disease Stable   6. Prediabetes -Discontinue metformin  - Hemoglobin A1c  Follow up: if symptoms persist or do not get better.   The patient was encouraged to call or send a message through [place] for any questions or concerns.   Side effects and appropriate use of all the medication(s) were discussed with the patient today. Patient advised to use the medication(s) as directed by their healthcare provider. The patient was encouraged to read, review, and understand all associated package inserts and contact our office with any questions or concerns. The patient accepts the risks of the treatment plan and had an opportunity to ask questions.   Staying healthy and adopting a healthy lifestyle for your overall health is important. You should eat 7 or more servings of fruits and vegetables per day. You should drink plenty of water to keep yourself hydrated and your kidneys healthy. This includes about 65-80+ fluid ounces of water. Limit your intake of animal fats especially for elevated cholesterol. Avoid highly processed food and limit your salt intake if you have hypertension. Avoid foods high in saturated/Trans fats. Along with a healthy diet it is also very important to maintain time for yourself to maintain a healthy mental health  with low stress levels. You should get atleast 150 min of moderate intensity exercise weekly for a healthy heart. Along with eating right and exercising, aim for at least 7-9 hours of sleep daily.  Eat more whole grains which includes barley, wheat berries, oats, brown rice and whole wheat pasta. Use healthy plant oils which include olive, soy, corn, sunflower and peanut. Limit your caffeine and sugary drinks. Limit your intake of fast foods. Limit milk and dairy products to one or two daily servings.   Patient was given opportunity to ask questions. Patient verbalized understanding of the plan and was able to repeat key elements of the plan. All questions were answered to their satisfaction.  Raman , DNP   I, Raman  have reviewed all documentation for this visit. The documentation on 06/18/21 for the exam, diagnosis, procedures, and orders are all accurate and complete.    IF YOU HAVE BEEN REFERRED TO A SPECIALIST, IT MAY TAKE 1-2 WEEKS TO SCHEDULE/PROCESS THE REFERRAL. IF YOU HAVE NOT HEARD FROM US/SPECIALIST IN TWO WEEKS, PLEASE GIVE [place] A CALL AT [phone].   THE PATIENT IS ENCOURAGED TO PRACTICE SOCIAL DISTANCING DUE TO THE COVID-19 PANDEMIC.

## 2021-06-19 LAB — BMP8+EGFR
BUN/Creatinine Ratio: 24 (ref 12–28)
BUN: 55 mg/dL — ABNORMAL HIGH (ref 8–27)
CO2: 22 mmol/L (ref 20–29)
Calcium: 8.9 mg/dL (ref 8.7–10.3)
Chloride: 110 mmol/L — ABNORMAL HIGH (ref 96–106)
Creatinine, Ser: 2.28 mg/dL — ABNORMAL HIGH (ref 0.57–1.00)
Glucose: 116 mg/dL — ABNORMAL HIGH (ref 65–99)
Potassium: 4.7 mmol/L (ref 3.5–5.2)
Sodium: 145 mmol/L — ABNORMAL HIGH (ref 134–144)
eGFR: 21 mL/min/{1.73_m2} — ABNORMAL LOW (ref >59)

## 2021-06-19 LAB — HEMOGLOBIN A1C
Est. average glucose Bld gHb Est-mCnc: 120 mg/dL
Hgb A1c MFr Bld: 5.8 % — ABNORMAL HIGH (ref 4.8–5.6)

## 2021-06-22 ENCOUNTER — Other Ambulatory Visit: Payer: Self-pay | Admitting: Internal Medicine

## 2021-06-22 DIAGNOSIS — Z1231 Encounter for screening mammogram for malignant neoplasm of breast: Secondary | ICD-10-CM

## 2021-06-24 ENCOUNTER — Encounter: Payer: Self-pay | Admitting: Nurse Practitioner

## 2021-06-24 DIAGNOSIS — R809 Proteinuria, unspecified: Secondary | ICD-10-CM | POA: Diagnosis not present

## 2021-06-24 DIAGNOSIS — N1831 Chronic kidney disease, stage 3a: Secondary | ICD-10-CM | POA: Diagnosis not present

## 2021-06-24 DIAGNOSIS — N179 Acute kidney failure, unspecified: Secondary | ICD-10-CM | POA: Diagnosis not present

## 2021-06-24 DIAGNOSIS — E1122 Type 2 diabetes mellitus with diabetic chronic kidney disease: Secondary | ICD-10-CM | POA: Diagnosis not present

## 2021-06-24 DIAGNOSIS — Z23 Encounter for immunization: Secondary | ICD-10-CM | POA: Diagnosis not present

## 2021-06-24 DIAGNOSIS — I129 Hypertensive chronic kidney disease with stage 1 through stage 4 chronic kidney disease, or unspecified chronic kidney disease: Secondary | ICD-10-CM | POA: Diagnosis not present

## 2021-06-24 DIAGNOSIS — I5031 Acute diastolic (congestive) heart failure: Secondary | ICD-10-CM | POA: Diagnosis not present

## 2021-06-24 DIAGNOSIS — E673 Hypervitaminosis D: Secondary | ICD-10-CM | POA: Diagnosis not present

## 2021-06-26 ENCOUNTER — Other Ambulatory Visit: Payer: Self-pay | Admitting: Nurse Practitioner

## 2021-06-26 DIAGNOSIS — N183 Chronic kidney disease, stage 3 unspecified: Secondary | ICD-10-CM

## 2021-06-26 DIAGNOSIS — R634 Abnormal weight loss: Secondary | ICD-10-CM

## 2021-06-26 DIAGNOSIS — I129 Hypertensive chronic kidney disease with stage 1 through stage 4 chronic kidney disease, or unspecified chronic kidney disease: Secondary | ICD-10-CM

## 2021-06-26 DIAGNOSIS — R630 Anorexia: Secondary | ICD-10-CM

## 2021-07-01 ENCOUNTER — Other Ambulatory Visit: Payer: Self-pay

## 2021-07-01 MED ORDER — MOMETASONE FURO-FORMOTEROL FUM 100-5 MCG/ACT IN AERO: 2.0000 | INHALATION_SPRAY | Freq: Two times a day (BID) | RESPIRATORY_TRACT | 1 refills | Status: DC

## 2021-07-03 DIAGNOSIS — E875 Hyperkalemia: Secondary | ICD-10-CM | POA: Diagnosis not present

## 2021-07-06 ENCOUNTER — Other Ambulatory Visit: Payer: Self-pay

## 2021-07-06 ENCOUNTER — Ambulatory Visit
Admission: RE | Admit: 2021-07-06 | Discharge: 2021-07-06 | Disposition: A | Discharge status: Home / Self Care | Payer: Medicare Other | Source: Ambulatory Visit | Attending: Nurse Practitioner | Admitting: Nurse Practitioner

## 2021-07-06 DIAGNOSIS — M85851 Other specified disorders of bone density and structure, right thigh: Secondary | ICD-10-CM | POA: Diagnosis not present

## 2021-07-06 DIAGNOSIS — E2839 Other primary ovarian failure: Secondary | ICD-10-CM

## 2021-07-06 DIAGNOSIS — M810 Age-related osteoporosis without current pathological fracture: Secondary | ICD-10-CM | POA: Diagnosis not present

## 2021-07-13 ENCOUNTER — Other Ambulatory Visit: Payer: Self-pay

## 2021-07-13 ENCOUNTER — Ambulatory Visit (INDEPENDENT_AMBULATORY_CARE_PROVIDER_SITE_OTHER): Payer: Medicare Other | Admitting: Nurse Practitioner

## 2021-07-13 VITALS — BP 132/70 | HR 75 | Temp 98.1°F | Ht 61.0 in | Wt 90.6 lb

## 2021-07-13 DIAGNOSIS — R531 Weakness: Secondary | ICD-10-CM

## 2021-07-13 DIAGNOSIS — I700 Atherosclerosis of aorta: Secondary | ICD-10-CM | POA: Diagnosis not present

## 2021-07-13 DIAGNOSIS — Z23 Encounter for immunization: Secondary | ICD-10-CM

## 2021-07-13 DIAGNOSIS — R7303 Prediabetes: Secondary | ICD-10-CM

## 2021-07-13 DIAGNOSIS — I129 Hypertensive chronic kidney disease with stage 1 through stage 4 chronic kidney disease, or unspecified chronic kidney disease: Secondary | ICD-10-CM

## 2021-07-13 DIAGNOSIS — N183 Chronic kidney disease, stage 3 unspecified: Secondary | ICD-10-CM | POA: Diagnosis not present

## 2021-07-13 DIAGNOSIS — E039 Hypothyroidism, unspecified: Secondary | ICD-10-CM | POA: Diagnosis not present

## 2021-07-13 DIAGNOSIS — I5031 Acute diastolic (congestive) heart failure: Secondary | ICD-10-CM | POA: Diagnosis not present

## 2021-07-13 LAB — POCT UA - MICROALBUMIN
Albumin/Creatinine Ratio, Urine, POC: >300
Creatinine, POC: 100 mg/dL
Microalbumin Ur, POC: 150 mg/L

## 2021-07-13 LAB — POCT URINALYSIS DIPSTICK
Bilirubin, UA: NEGATIVE
Blood, UA: NEGATIVE
Glucose, UA: NEGATIVE
Ketones, UA: NEGATIVE
Leukocytes, UA: NEGATIVE
Nitrite, UA: NEGATIVE
Protein, UA: POSITIVE — AB
Spec Grav, UA: 1.02 (ref 1.010–1.025)
Urobilinogen, UA: 0.2 E.U./dL
pH, UA: 5.5 (ref 5.0–8.0)

## 2021-07-13 MED ORDER — SHINGRIX 50 MCG/0.5ML IM SUSR: 0.5000 mL | Freq: Once | INTRAMUSCULAR | 1 refills | Status: AC

## 2021-07-13 NOTE — Progress Notes (Signed)
I,Tianna Badgett,acting as a Education administrator for [employer], FNP.,have documented all relevant documentation on the behalf of [name], FNP,as directed by  [name], FNP while in the presence of [name], [place].  This visit occurred during the SARS-CoV-2 public health emergency.  Safety protocols were in place, including screening questions prior to the visit, additional usage of staff PPE, and extensive cleaning of exam room while observing appropriate contact time as indicated for disinfecting solutions.  Subjective:     Patient ID: Leslie Reeves , female    DOB: 05/20/1941 , 80 y.o.   MRN: [id number]   Chief Complaint  Patient presents with   Hypertension     HPI  Pt is here today for a HTN and prediabetes follow up. Nephrologist had her to restrict foods due to elevated potassium and did labs to see if kidney function is better. She has been in the bed most times since being back in the office. Her daughter reports she stays in the bed most days and the patient reports becoming winded when walking.   Wt Readings from Last 3 Encounters: 07/13/21 : 90 lb 9.6 oz (41.1 kg) 06/13/21 : 98 lb 1.7 oz (44.5 kg) 04/23/21 : 86 lb 9.6 oz (39.3 kg)    Hypertension This is a chronic problem. Pertinent negatives include no chest pain or headaches. She has tried calcium channel blockers for the symptoms. The treatment provided significant relief.    Past Medical History:  Diagnosis Date   Allergy    Diabetes mellitus without complication ([place])    Hypertension      Family History  Problem Relation Age of Onset   Heart disease Other    Stroke Other    Hypertension Other    COPD Other    Cancer Other    Asthma Other    Breast cancer Neg Hx     No current facility-administered medications for this visit. No current outpatient medications on file.  Facility-Administered Medications Ordered in Other Visits:    acetaminophen (TYLENOL) tablet 650 mg, 650 mg, Oral, Q4H PRN, Bhagat,  Bhavinkumar, PA, 650 mg at 07/23/21 0600   albuterol (PROVENTIL) (2.5 MG/3ML) 0.083% nebulizer solution 3 mL, 3 mL, Inhalation, Q6H PRN, Bhagat, Bhavinkumar, PA   feeding supplement (ENSURE ENLIVE / ENSURE PLUS) liquid 237 mL, 237 mL, Oral, BID BM, Irish Lack, Jayadeep S, MD, 237 mL at 07/23/21 1400   isosorbide-hydrALAZINE (BIDIL) 20-37.5 MG per tablet 1 tablet, 1 tablet, Oral, TID, Bhagat, Bhavinkumar, PA, 1 tablet at 07/23/21 2200   levothyroxine (SYNTHROID) tablet 37.5 mcg, 37.5 mcg, Oral, Q0600, Bhagat, Bhavinkumar, PA, 37.5 mcg at 07/23/21 0600   MEDLINE mouth rinse, 15 mL, Mouth Rinse, BID, Rai, Ripudeep K, MD, 15 mL at 07/23/21 2200   megestrol (MEGACE) 400 MG/10ML suspension 600 mg, 600 mg, Oral, Daily, Bhagat, Bhavinkumar, PA, 600 mg at 07/23/21 1006   mirtazapine (REMERON) tablet 15 mg, 15 mg, Oral, QHS, Bhagat, Bhavinkumar, PA, 15 mg at 07/23/21 2152   mometasone-formoterol (DULERA) 100-5 MCG/ACT inhaler 2 puff, 2 puff, Inhalation, BID, Bhagat, Bhavinkumar, PA, 2 puff at 07/23/21 1927   multivitamin with minerals tablet 1 tablet, 1 tablet, Oral, Daily, Jettie Booze, MD, 1 tablet at 07/23/21 1005   nitroGLYCERIN (NITROSTAT) SL tablet 0.4 mg, 0.4 mg, Sublingual, Q5 Min x 3 PRN, Bhagat, Bhavinkumar, PA   ondansetron (ZOFRAN) injection 4 mg, 4 mg, Intravenous, Q6H PRN, Bhagat, Bhavinkumar, PA   rosuvastatin (CRESTOR) tablet 10 mg, 10 mg, Oral, Daily, Nahser, Arnette Norris  J, MD, 10 mg at 07/23/21 1005   Allergies  Allergen Reactions   Penicillin G Rash   Penicillins Rash     Review of Systems  Constitutional: Negative.   Respiratory: Negative.  Negative for shortness of breath.   Cardiovascular: Negative.  Negative for chest pain and palpitations.  Gastrointestinal: Negative.   Neurological: Negative.  Negative for headaches.    Today's Vitals   07/13/21 0958  BP: 132/70  Pulse: 75  Temp: 98.1 F (36.7 C)  TempSrc: Oral  Weight: 90 lb 9.6 oz (41.1 kg)  Height: 5' 1" (1.549  m)   Body mass index is 17.12 kg/m.  Wt Readings from Last 3 Encounters:  07/23/21 96 lb 9 oz (43.8 kg)  07/15/21 91 lb 3.2 oz (41.4 kg)  07/13/21 90 lb 9.6 oz (41.1 kg)    Objective:  Physical Exam Vitals reviewed.  Constitutional:      General: She is not in acute distress.    Appearance: Normal appearance.  Cardiovascular:     Rate and Rhythm: Normal rate and regular rhythm.     Pulses: Normal pulses.     Heart sounds: Normal heart sounds. No murmur heard. Pulmonary:     Effort: Pulmonary effort is normal. No respiratory distress.     Breath sounds: Normal breath sounds. No wheezing.  Skin:    Comments: Thickened toenails   Neurological:     General: No focal deficit present.     Mental Status: She is alert and oriented to person, place, and time.     Cranial Nerves: No cranial nerve deficit.     Motor: No weakness.  Psychiatric:        Mood and Affect: Mood normal.        Behavior: Behavior normal.        Thought Content: Thought content normal.        Judgment: Judgment normal.        Assessment And Plan:     1. Benign hypertension with chronic kidney disease, stage III ([place]) Comments: Blood pressure is well controlled Continue current medications  2. Prediabetes Comments: STable, no current medications - POCT Urinalysis Dipstick (81002) - POCT UA - Microalbumin  3. Acquired hypothyroidism - TSH - T4 - T3, free  4. Acute diastolic congestive heart failure ([place]) Comments: Will recheck BNP due to being more winded No obvious shortness of breath during visit, reports recovers well.  - Brain natriuretic peptide  5. Generalized weakness Comments: She has a slow gait with slight unsteadiness. Will order PT again as she had a hospitalization in September which may have set her back - Ambulatory referral to Physical Therapy  6. Atherosclerosis of aorta ([place]) Comments: Tolerating statin well  7. Encounter for immunization Rx sent to pharmacy - Zoster  Vaccine Adjuvanted [facility]) injection; Inject 0.5 mLs into the muscle once for 1 dose.  Dispense: 0.5 mL; Refill: 1    I personally spent 30 minutes face-to-face and non-face-to-face in the care of this patient, which includes all pre-, intra-, and post visit time on the date of service.  Patient was given opportunity to ask questions. Patient verbalized understanding of the plan and was able to repeat key elements of the plan. All questions were answered to their satisfaction.  [name], FNP   I, [name], FNP, have reviewed all documentation for this visit. The documentation on 07/24/21 for the exam, diagnosis, procedures, and orders are all accurate and complete.   IF YOU HAVE BEEN  REFERRED TO A SPECIALIST, IT MAY TAKE 1-2 WEEKS TO SCHEDULE/PROCESS THE REFERRAL. IF YOU HAVE NOT HEARD FROM US/SPECIALIST IN TWO WEEKS, PLEASE GIVE [place] A CALL AT [phone].   THE PATIENT IS ENCOURAGED TO PRACTICE SOCIAL DISTANCING DUE TO THE COVID-19 PANDEMIC.

## 2021-07-13 NOTE — Patient Instructions (Signed)

## 2021-07-14 NOTE — Progress Notes (Signed)
Cardiology Office Note:    Date:  07/14/2021   ID:  Leslie Reeves, DOB Jul 07, 1941, MRN [id number]  PCP:  [name], FNP   [facility] Providers Cardiologist:  None     Referring MD: [name], NP   No chief complaint on file. Diastolic HF  History of Present Illness:    Leslie Reeves is a 80 y.o. female with a hx of htn, who was hospitalized 06/11/2021- 06/13/2021 with decompensated Hfpef  She presented with dyspnea and swelling in her lower extremities which was progressive. She had elevated JVD and LE edema. She was managed with lasix and discharged on lasix 20 mg daily after two days in the hospital. Her BNP was 125. She had troponin level 100s with no delta. Her dry weight is around 80 pounds. She saw here PCP yesterday. She is in bed mostly at home and gets winded with walking. She sees a nephrologist. Her crt in July 1.22 and has slowly increased and continues to increase. She is on norvasc 10 mg for hypertension and bidil.  Today her daughter was concerned that she was having increased fatigue, and fluid retention. She had no chest pain. She was having some dyspnea at the end of July/August which family thought may have been due to allergies. She then was not doing her regular activities and slept all the time. She was started on lasix and potassium. She saw her nephrologist on [address] recently and was told to increase lasix to 40 mg daily and stop the potassium. She has not been eating lately. Blood pressures typically 503U systolic. She's sob with putting on clothes.  No known coronary artery disease history. She had CT head that showed chronic microvascular dx in the deep white matter.   06/12/2021:TTE normal LV function, no RWMA, normal RV function, modeate pulmonary htn RVSP 46 mmHg  Social History: over 50 years smoking. She is in bed most of the day  Family History: Mother- heart disease. Lived to 30. Father - heart disease. Brother - heart disease. Males  deceased in there 28s  Past Medical History:  Diagnosis Date   Allergy    Diabetes mellitus without complication ([place])    Hypertension     Past Surgical History:  Procedure Laterality Date   ABDOMINAL HYSTERECTOMY      Current Medications: No outpatient medications have been marked as taking for the 07/15/21 encounter (Appointment) with [name], MD.     Allergies:   Penicillin g and Penicillins   Social History   Socioeconomic History   Marital status: Married    Spouse name: Not on file   Number of children: 2   Years of education: Not on file   Highest education level: High school graduate  Occupational History   Occupation: retired  Tobacco Use   Smoking status: Former    Packs/day: 0.50    Years: 50.00    Pack years: 25.00    Types: Cigarettes    Quit date: 2017    Years since quitting: 5.7   Smokeless tobacco: Never  Vaping Use   Vaping Use: Never used  Substance and Sexual Activity   Alcohol use: Not Currently   Drug use: Not Currently   Sexual activity: Not Currently  Other Topics Concern   Not on file  Social History Narrative   Not on file   Social Determinants of Health   Financial Resource Strain: Low Risk    Difficulty of Paying Living Expenses: Not hard  at all  Food Insecurity: No Food Insecurity   Worried About Charity fundraiser in the Last Year: Never true   Ran Out of Food in the Last Year: Never true  Transportation Needs: No Transportation Needs   Lack of Transportation (Medical): No   Lack of Transportation (Non-Medical): No  Physical Activity: Inactive   Days of Exercise per Week: 0 days   Minutes of Exercise per Session: 0 min  Stress: No Stress Concern Present   Feeling of Stress : Not at all  Social Connections: Not on file     Family History: The patient's family history includes Asthma in an other family member; COPD in an other family member; Cancer in an other family member; Heart disease in an other family  member; Hypertension in an other family member; Stroke in an other family member. There is no history of Breast cancer.  ROS:   Please see the history of present illness.     All other systems reviewed and are negative.  EKGs/Labs/Other Studies Reviewed:    The following studies were reviewed today:   EKG:  EKG is  ordered today.  The ekg ordered today demonstrates   NSR. Normal ecg  Recent Labs: 04/23/2021: TSH 2.220 06/11/2021: B Natriuretic Peptide 125.8; Hemoglobin 11.1; Platelets 286 06/12/2021: ALT 13 06/18/2021: BUN 55; Creatinine, Ser 2.28; Potassium 4.7; Sodium 145  Recent Lipid Panel    Component Value Date/Time   CHOL 158 11/21/2019 1102   TRIG 70 11/21/2019 1102   HDL 78 11/21/2019 1102   CHOLHDL 2.0 11/21/2019 1102   LDLCALC 66 11/21/2019 1102     Risk Assessment/Calculations:           Physical Exam:    VS:  There were no vitals taken for this visit.    Wt Readings from Last 3 Encounters:  07/13/21 90 lb 9.6 oz (41.1 kg)  06/13/21 98 lb 1.7 oz (44.5 kg)  04/23/21 86 lb 9.6 oz (39.3 kg)     GEN: Cachectic, dyspnea with mild movement HEENT: Normal NECK: JVD not seen at 45 degees with exaggerated carotid pulse; No carotid bruits CARDIAC: RRR, no murmurs, rubs, gallops Vasc: 2+ BL radial pulses RESPIRATORY:  increased wob use of accessory muscles at rest, decreased BS at the bases ABDOMEN: Soft, non-tender, non-distended MUSCULOSKELETAL:  No edema; No deformity  SKIN: Warm and dry NEUROLOGIC:  Alert and oriented x 3 PSYCHIATRIC:  Normal affect   ASSESSMENT:    NYHA Class IV Diastolic HF: Mrs. Panning is acutely short of breath with minimal activity. Her crt is up to 2.28 now from baseline 1.22. Dry weight per family closer to 80 pounds and weight is mildly up. She is not able to ambulate or dress without significant dyspnea which is worse from baseline. She only had a couple days in the hospital and was discharged without ambulating to ensure she  returned to baseline. Recent labs are pending. Would recommend diuresis with 40 IV lasix , monitoring renal function daily and ensuring symptoms return closer to baseline with ambulation with pulse ox.   Of note, In terms of BIDIL, the AHEFT study recommending BIDIL was in patients with systolic heart failure (Nyha cls III and mean EF 23) Also its recommended after other GDMT for systolic HF.   #HTN - continue norvasc - would consider norvasc,  and spironolactone if renal fxn permits and losartan if needed (as long as AKI resolves). Stopping bidil  -BP goal  > 100 SBP- < 130/80  mmHg  PLAN:    In order of problems listed above:  Admission to the hospital Follow up in one month      Medication Adjustments/Labs and Tests Ordered: Current medicines are reviewed at length with the patient today.  Concerns regarding medicines are outlined above.    Signed, [name], MD  07/14/2021 4:10 PM    [facility]

## 2021-07-15 ENCOUNTER — Ambulatory Visit (INDEPENDENT_AMBULATORY_CARE_PROVIDER_SITE_OTHER): Payer: Medicare Other

## 2021-07-15 ENCOUNTER — Ambulatory Visit (INDEPENDENT_AMBULATORY_CARE_PROVIDER_SITE_OTHER): Payer: Medicare Other | Admitting: Internal Medicine

## 2021-07-15 ENCOUNTER — Telehealth: Payer: Medicare Other

## 2021-07-15 ENCOUNTER — Observation Stay (HOSPITAL_COMMUNITY): Payer: Medicare Other

## 2021-07-15 ENCOUNTER — Other Ambulatory Visit: Payer: Self-pay

## 2021-07-15 ENCOUNTER — Inpatient Hospital Stay (HOSPITAL_COMMUNITY)
Admission: AD | Admit: 2021-07-15 | Discharge: 2021-07-24 | DRG: 291 | Disposition: A | Discharge status: Home Health | Payer: Medicare Other | Source: Ambulatory Visit | Attending: Student | Admitting: Student

## 2021-07-15 ENCOUNTER — Encounter: Payer: Self-pay | Admitting: Internal Medicine

## 2021-07-15 ENCOUNTER — Encounter (HOSPITAL_COMMUNITY): Payer: Self-pay | Admitting: Interventional Cardiology

## 2021-07-15 VITALS — BP 132/58 | HR 83 | Ht 61.0 in | Wt 91.2 lb

## 2021-07-15 DIAGNOSIS — T502X5A Adverse effect of carbonic-anhydrase inhibitors, benzothiadiazides and other diuretics, initial encounter: Secondary | ICD-10-CM | POA: Diagnosis present

## 2021-07-15 DIAGNOSIS — W1830XA Fall on same level, unspecified, initial encounter: Secondary | ICD-10-CM | POA: Diagnosis not present

## 2021-07-15 DIAGNOSIS — E869 Volume depletion, unspecified: Secondary | ICD-10-CM | POA: Diagnosis present

## 2021-07-15 DIAGNOSIS — Z20822 Contact with and (suspected) exposure to covid-19: Secondary | ICD-10-CM | POA: Diagnosis not present

## 2021-07-15 DIAGNOSIS — E1169 Type 2 diabetes mellitus with other specified complication: Secondary | ICD-10-CM

## 2021-07-15 DIAGNOSIS — J449 Chronic obstructive pulmonary disease, unspecified: Secondary | ICD-10-CM

## 2021-07-15 DIAGNOSIS — Z825 Family history of asthma and other chronic lower respiratory diseases: Secondary | ICD-10-CM

## 2021-07-15 DIAGNOSIS — D500 Iron deficiency anemia secondary to blood loss (chronic): Secondary | ICD-10-CM | POA: Diagnosis not present

## 2021-07-15 DIAGNOSIS — E039 Hypothyroidism, unspecified: Secondary | ICD-10-CM | POA: Diagnosis not present

## 2021-07-15 DIAGNOSIS — S300XXA Contusion of lower back and pelvis, initial encounter: Secondary | ICD-10-CM | POA: Diagnosis not present

## 2021-07-15 DIAGNOSIS — Z7982 Long term (current) use of aspirin: Secondary | ICD-10-CM

## 2021-07-15 DIAGNOSIS — J9811 Atelectasis: Secondary | ICD-10-CM | POA: Diagnosis not present

## 2021-07-15 DIAGNOSIS — I2723 Pulmonary hypertension due to lung diseases and hypoxia: Secondary | ICD-10-CM | POA: Diagnosis present

## 2021-07-15 DIAGNOSIS — E785 Hyperlipidemia, unspecified: Secondary | ICD-10-CM | POA: Diagnosis not present

## 2021-07-15 DIAGNOSIS — Z90710 Acquired absence of both cervix and uterus: Secondary | ICD-10-CM

## 2021-07-15 DIAGNOSIS — J42 Unspecified chronic bronchitis: Secondary | ICD-10-CM

## 2021-07-15 DIAGNOSIS — R809 Proteinuria, unspecified: Secondary | ICD-10-CM | POA: Diagnosis not present

## 2021-07-15 DIAGNOSIS — D62 Acute posthemorrhagic anemia: Secondary | ICD-10-CM | POA: Diagnosis not present

## 2021-07-15 DIAGNOSIS — J432 Centrilobular emphysema: Secondary | ICD-10-CM | POA: Diagnosis not present

## 2021-07-15 DIAGNOSIS — N179 Acute kidney failure, unspecified: Secondary | ICD-10-CM | POA: Diagnosis present

## 2021-07-15 DIAGNOSIS — R222 Localized swelling, mass and lump, trunk: Secondary | ICD-10-CM

## 2021-07-15 DIAGNOSIS — E875 Hyperkalemia: Secondary | ICD-10-CM | POA: Diagnosis not present

## 2021-07-15 DIAGNOSIS — Z8673 Personal history of transient ischemic attack (TIA), and cerebral infarction without residual deficits: Secondary | ICD-10-CM

## 2021-07-15 DIAGNOSIS — R0602 Shortness of breath: Secondary | ICD-10-CM | POA: Diagnosis not present

## 2021-07-15 DIAGNOSIS — R5381 Other malaise: Secondary | ICD-10-CM | POA: Diagnosis not present

## 2021-07-15 DIAGNOSIS — D631 Anemia in chronic kidney disease: Secondary | ICD-10-CM | POA: Diagnosis not present

## 2021-07-15 DIAGNOSIS — Z880 Allergy status to penicillin: Secondary | ICD-10-CM

## 2021-07-15 DIAGNOSIS — I5033 Acute on chronic diastolic (congestive) heart failure: Secondary | ICD-10-CM | POA: Diagnosis not present

## 2021-07-15 DIAGNOSIS — I10 Essential (primary) hypertension: Secondary | ICD-10-CM

## 2021-07-15 DIAGNOSIS — Z681 Body mass index (BMI) 19 or less, adult: Secondary | ICD-10-CM

## 2021-07-15 DIAGNOSIS — J9601 Acute respiratory failure with hypoxia: Secondary | ICD-10-CM | POA: Diagnosis not present

## 2021-07-15 DIAGNOSIS — E43 Unspecified severe protein-calorie malnutrition: Secondary | ICD-10-CM | POA: Diagnosis not present

## 2021-07-15 DIAGNOSIS — W19XXXA Unspecified fall, initial encounter: Secondary | ICD-10-CM

## 2021-07-15 DIAGNOSIS — I5031 Acute diastolic (congestive) heart failure: Secondary | ICD-10-CM

## 2021-07-15 DIAGNOSIS — E1122 Type 2 diabetes mellitus with diabetic chronic kidney disease: Secondary | ICD-10-CM | POA: Diagnosis not present

## 2021-07-15 DIAGNOSIS — Z79890 Hormone replacement therapy (postmenopausal): Secondary | ICD-10-CM

## 2021-07-15 DIAGNOSIS — I2781 Cor pulmonale (chronic): Secondary | ICD-10-CM | POA: Diagnosis present

## 2021-07-15 DIAGNOSIS — D649 Anemia, unspecified: Secondary | ICD-10-CM | POA: Diagnosis present

## 2021-07-15 DIAGNOSIS — I5032 Chronic diastolic (congestive) heart failure: Secondary | ICD-10-CM | POA: Diagnosis present

## 2021-07-15 DIAGNOSIS — R0600 Dyspnea, unspecified: Secondary | ICD-10-CM

## 2021-07-15 DIAGNOSIS — E038 Other specified hypothyroidism: Secondary | ICD-10-CM | POA: Diagnosis not present

## 2021-07-15 DIAGNOSIS — R7303 Prediabetes: Secondary | ICD-10-CM | POA: Diagnosis not present

## 2021-07-15 DIAGNOSIS — R627 Adult failure to thrive: Secondary | ICD-10-CM | POA: Diagnosis present

## 2021-07-15 DIAGNOSIS — Z823 Family history of stroke: Secondary | ICD-10-CM

## 2021-07-15 DIAGNOSIS — I130 Hypertensive heart and chronic kidney disease with heart failure and stage 1 through stage 4 chronic kidney disease, or unspecified chronic kidney disease: Principal | ICD-10-CM | POA: Diagnosis present

## 2021-07-15 DIAGNOSIS — Z8249 Family history of ischemic heart disease and other diseases of the circulatory system: Secondary | ICD-10-CM

## 2021-07-15 DIAGNOSIS — I50812 Chronic right heart failure: Secondary | ICD-10-CM | POA: Diagnosis not present

## 2021-07-15 DIAGNOSIS — I509 Heart failure, unspecified: Secondary | ICD-10-CM | POA: Diagnosis not present

## 2021-07-15 DIAGNOSIS — R54 Age-related physical debility: Secondary | ICD-10-CM | POA: Diagnosis present

## 2021-07-15 DIAGNOSIS — J4520 Mild intermittent asthma, uncomplicated: Secondary | ICD-10-CM | POA: Diagnosis not present

## 2021-07-15 DIAGNOSIS — Y92230 Patient room in hospital as the place of occurrence of the external cause: Secondary | ICD-10-CM | POA: Diagnosis not present

## 2021-07-15 DIAGNOSIS — R0609 Other forms of dyspnea: Secondary | ICD-10-CM | POA: Diagnosis not present

## 2021-07-15 DIAGNOSIS — D509 Iron deficiency anemia, unspecified: Secondary | ICD-10-CM | POA: Diagnosis not present

## 2021-07-15 DIAGNOSIS — J410 Simple chronic bronchitis: Secondary | ICD-10-CM | POA: Diagnosis not present

## 2021-07-15 DIAGNOSIS — E538 Deficiency of other specified B group vitamins: Secondary | ICD-10-CM

## 2021-07-15 DIAGNOSIS — Z79899 Other long term (current) drug therapy: Secondary | ICD-10-CM

## 2021-07-15 DIAGNOSIS — R634 Abnormal weight loss: Secondary | ICD-10-CM

## 2021-07-15 DIAGNOSIS — R609 Edema, unspecified: Secondary | ICD-10-CM

## 2021-07-15 DIAGNOSIS — N1832 Chronic kidney disease, stage 3b: Secondary | ICD-10-CM

## 2021-07-15 DIAGNOSIS — Z87891 Personal history of nicotine dependence: Secondary | ICD-10-CM

## 2021-07-15 DIAGNOSIS — N184 Chronic kidney disease, stage 4 (severe): Secondary | ICD-10-CM | POA: Diagnosis present

## 2021-07-15 LAB — CBC
HCT: 25.5 % — ABNORMAL LOW (ref 36.0–46.0)
Hemoglobin: 8 g/dL — ABNORMAL LOW (ref 12.0–15.0)
MCH: 26.5 pg (ref 26.0–34.0)
MCHC: 31.4 g/dL (ref 30.0–36.0)
MCV: 84.4 fL (ref 80.0–100.0)
Platelets: 272 10*3/uL (ref 150–400)
RBC: 3.02 MIL/uL — ABNORMAL LOW (ref 3.87–5.11)
RDW: 24.9 % — ABNORMAL HIGH (ref 11.5–15.5)
WBC: 5 10*3/uL (ref 4.0–10.5)
nRBC: 0 % (ref 0.0–0.2)

## 2021-07-15 LAB — COMPREHENSIVE METABOLIC PANEL
ALT: 29 U/L (ref 0–44)
AST: 23 U/L (ref 15–41)
Albumin: 2.9 g/dL — ABNORMAL LOW (ref 3.5–5.0)
Alkaline Phosphatase: 38 U/L (ref 38–126)
Anion gap: 10 (ref 5–15)
BUN: 85 mg/dL — ABNORMAL HIGH (ref 8–23)
CO2: 24 mmol/L (ref 22–32)
Calcium: 8.8 mg/dL — ABNORMAL LOW (ref 8.9–10.3)
Chloride: 107 mmol/L (ref 98–111)
Creatinine, Ser: 2.47 mg/dL — ABNORMAL HIGH (ref 0.44–1.00)
GFR, Estimated: 19 mL/min — ABNORMAL LOW (ref >60)
Glucose, Bld: 96 mg/dL (ref 70–99)
Potassium: 3.7 mmol/L (ref 3.5–5.1)
Sodium: 141 mmol/L (ref 135–145)
Total Bilirubin: 0.4 mg/dL (ref 0.3–1.2)
Total Protein: 5.8 g/dL — ABNORMAL LOW (ref 6.5–8.1)

## 2021-07-15 LAB — TSH: TSH: 1.69 u[IU]/mL (ref 0.350–4.500)

## 2021-07-15 LAB — RESP PANEL BY RT-PCR (FLU A&B, COVID) ARPGX2
Influenza A by PCR: NEGATIVE
Influenza B by PCR: NEGATIVE
SARS Coronavirus 2 by RT PCR: NEGATIVE

## 2021-07-15 MED ORDER — MOMETASONE FURO-FORMOTEROL FUM 100-5 MCG/ACT IN AERO
2.0000 | INHALATION_SPRAY | Freq: Two times a day (BID) | RESPIRATORY_TRACT | Status: DC
  Administered 2021-07-16 – 2021-07-24 (×17): 2 via RESPIRATORY_TRACT
  Filled 2021-07-15: qty 8.8

## 2021-07-15 MED ORDER — ONDANSETRON HCL 4 MG/2ML IJ SOLN: 4.0000 mg | Freq: Four times a day (QID) | INTRAMUSCULAR | Status: DC | PRN

## 2021-07-15 MED ORDER — ISOSORB DINITRATE-HYDRALAZINE 20-37.5 MG PO TABS
1.0000 | ORAL_TABLET | Freq: Three times a day (TID) | ORAL | Status: DC
  Administered 2021-07-16 – 2021-07-24 (×24): 1 via ORAL
  Filled 2021-07-15 (×24): qty 1

## 2021-07-15 MED ORDER — ROSUVASTATIN CALCIUM 20 MG PO TABS
20.0000 mg | ORAL_TABLET | Freq: Every day | ORAL | Status: DC
  Filled 2021-07-15: qty 1

## 2021-07-15 MED ORDER — MEGESTROL ACETATE 400 MG/10ML PO SUSP
600.0000 mg | Freq: Every day | ORAL | Status: DC
  Administered 2021-07-16 – 2021-07-24 (×9): 600 mg via ORAL
  Filled 2021-07-15 (×8): qty 20
  Filled 2021-07-15: qty 15
  Filled 2021-07-15: qty 20

## 2021-07-15 MED ORDER — LEVOTHYROXINE SODIUM 75 MCG PO TABS
37.5000 ug | ORAL_TABLET | Freq: Every day | ORAL | Status: DC
  Administered 2021-07-16 – 2021-07-24 (×9): 37.5 ug via ORAL
  Filled 2021-07-15 (×9): qty 1

## 2021-07-15 MED ORDER — MIRTAZAPINE 15 MG PO TABS
15.0000 mg | ORAL_TABLET | Freq: Every day | ORAL | Status: DC
  Administered 2021-07-15 – 2021-07-23 (×9): 15 mg via ORAL
  Filled 2021-07-15 (×9): qty 1

## 2021-07-15 MED ORDER — HEPARIN SODIUM (PORCINE) 5000 UNIT/ML IJ SOLN
5000.0000 [IU] | Freq: Three times a day (TID) | INTRAMUSCULAR | Status: DC
Start: 2021-07-15 — End: 2021-07-21
  Administered 2021-07-15 – 2021-07-21 (×17): 5000 [IU] via SUBCUTANEOUS
  Filled 2021-07-15 (×17): qty 1

## 2021-07-15 MED ORDER — SODIUM CHLORIDE 0.9 % IV SOLN: INTRAVENOUS | Status: AC

## 2021-07-15 MED ORDER — ALBUTEROL SULFATE (2.5 MG/3ML) 0.083% IN NEBU: 3.0000 mL | INHALATION_SOLUTION | Freq: Four times a day (QID) | RESPIRATORY_TRACT | Status: DC | PRN

## 2021-07-15 MED ORDER — MOMETASONE FURO-FORMOTEROL FUM 100-5 MCG/ACT IN AERO: 2.0000 | INHALATION_SPRAY | Freq: Two times a day (BID) | RESPIRATORY_TRACT | Status: DC

## 2021-07-15 MED ORDER — NITROGLYCERIN 0.4 MG SL SUBL: 0.4000 mg | SUBLINGUAL_TABLET | SUBLINGUAL | Status: DC | PRN

## 2021-07-15 MED ORDER — ACETAMINOPHEN 325 MG PO TABS
650.0000 mg | ORAL_TABLET | ORAL | Status: DC | PRN
  Administered 2021-07-20 – 2021-07-23 (×6): 650 mg via ORAL
  Filled 2021-07-15 (×6): qty 2

## 2021-07-15 MED ORDER — AMLODIPINE BESYLATE 10 MG PO TABS
10.0000 mg | ORAL_TABLET | Freq: Every day | ORAL | Status: DC
  Administered 2021-07-16 – 2021-07-22 (×7): 10 mg via ORAL
  Filled 2021-07-15 (×7): qty 1

## 2021-07-15 NOTE — Chronic Care Management (AMB) (Signed)
Chronic Care Management   CCM RN Visit Note  07/15/2021 Name: Leslie Reeves MRN: [id number] DOB: [birth year]  Subjective: Leslie Reeves is a 80 y.o. year old female who is a primary care patient of Leslie Reeves, Leslie Reeves. The care management team was consulted for assistance with disease management and care coordination needs.    Engaged with patient by telephone for follow up visit in response to provider referral for case management and/or care coordination services.   Consent to Services:  The patient was given information about Chronic Care Management services, agreed to services, and gave verbal consent prior to initiation of services.  Please see initial visit note for detailed documentation.   Patient agreed to services and verbal consent obtained.   Assessment: Review of patient past medical history, allergies, medications, health status, including review of consultants reports, laboratory and other test data, was performed as part of comprehensive evaluation and provision of chronic care management services.   SDOH (Social Determinants of Health) assessments and interventions performed:    CCM Care Plan  Allergies  Allergen Reactions   Penicillin G Rash   Penicillins Rash    Outpatient Encounter Medications as of 07/15/2021  Medication Sig   Accu-Chek Softclix Lancets lancets Use as instructed   albuterol (VENTOLIN HFA) 108 (90 Base) MCG/ACT inhaler INHALE 2 PUFFS BY MOUTH 4 TIMES A DAY (Patient taking differently: Inhale 2 puffs into the lungs every 6 (six) hours as needed for shortness of breath or wheezing.)   amLODipine (NORVASC) 10 MG tablet TAKE 1 TABLET BY MOUTH EVERY DAY   aspirin (BAYER ASPIRIN) 325 MG tablet Take 1 tablet (325 mg total) by mouth daily.   blood glucose meter kit and supplies KIT Dispense based on patient and insurance preference. Use up to four times daily as directed. (FOR ICD-9 250.00, 250.01).   diclofenac sodium (VOLTAREN) 1 % GEL Apply 2 g  topically 4 (four) times daily. (Patient taking differently: Apply 2 g topically 4 (four) times daily as needed (pain).)   fluticasone-salmeterol (WIXELA INHUB) 100-50 MCG/ACT AEPB TAKE 1 PUFF BY MOUTH EVERY 12 HOURS IN THE MORNING AND IN THE EVENING   furosemide (LASIX) 40 MG tablet Take 40 mg by mouth.   glucose blood (ACCU-CHEK AVIVA PLUS) test strip Use as instructed   isosorbide-hydrALAZINE (BIDIL) 20-37.5 MG tablet Take 1 tablet by mouth 3 (three) times daily.   levothyroxine (SYNTHROID) 25 MCG tablet Take 1.5 tablets (37.5 mcg total) by mouth daily before breakfast.   megestrol (MEGACE ES) 625 MG/5ML suspension TAKE 5 MLS (625 MG TOTAL) BY MOUTH DAILY.   megestrol (MEGACE) 40 MG/ML suspension SMARTSIG:9.1 Milliliter(s) By Mouth Daily   mirtazapine (REMERON) 15 MG tablet TAKE 1 TABLET BY MOUTH EVERYDAY AT BEDTIME (Patient taking differently: Take 15 mg by mouth at bedtime.)   mometasone-formoterol (DULERA) 100-5 MCG/ACT AERO Inhale 2 puffs into the lungs 2 (two) times daily.   potassium chloride SA (KLOR-CON) 20 MEQ tablet Take 1 tablet (20 mEq total) by mouth every other day. Must be taken with Lasix/ Furosemide   rosuvastatin (CRESTOR) 20 MG tablet TAKE 1 TABLET BY MOUTH EVERY DAY (Patient taking differently: Take 20 mg by mouth daily.)   No facility-administered encounter medications on file as of 07/15/2021.    Patient Active Problem List   Diagnosis Date Noted   Chronic kidney disease (CKD) stage G3a/A1, moderately decreased glomerular filtration rate (GFR) between 45-59 mL/min/1.73 square meter and albuminuria creatinine ratio less than 30 mg/g ([place]) 06/13/2021  DM2 (diabetes mellitus, type 2) ([place]) 06/12/2021   AKI (acute kidney injury) ([place]) 06/12/2021   New onset of congestive heart failure ([place]) 06/12/2021   Solitary pulmonary nodule 05/05/2020   Mild intermittent asthma 05/05/2020   Decreased appetite 02/05/2019   Prediabetes 02/05/2019   Essential hypertension 02/05/2019    Right thyroid nodule 10/09/2018    Conditions to be addressed/monitored: Essential hypertension, Decreased appetite, Abnormal weight loss, Malnutrition, B12 Deficiency, Stage 3b chronic kidney disease, Acute Diastolic Congestive Heart Failure   Care Plan : Heart Failure (Adult)  Updates made by Lynne Logan, RN since 07/15/2021 12:00 AM     Problem: Symptom Exacerbation (Heart Failure)   Priority: High     Goal: Symptom Exacerbation Prevented or Minimized   Start Date: 07/15/2021  Expected End Date: 10/15/2021  This Visit's Progress: On track  Priority: High  Note:   Current Barriers:  Knowledge deficit related to basic heart failure pathophysiology and self care management Knowledge Deficits related to heart failure medications Unable to perform IADLs independently Case Manager Clinical Goal(s):  patient will weigh self daily and record patient will verbalize understanding of Heart Failure Action Plan and when to call doctor patient will take all Heart Failure mediations as prescribed patient will weigh daily and record (notifying MD of 3 lb weight gain over night or 5 lb in a week) Interventions:  07/15/21 completed successful outbound call with patients daughter Leslie Reeves Collaboration with Leslie Reeves, [place] regarding development and update of comprehensive plan of care as evidenced by provider attestation and co-signature Inter-disciplinary care team collaboration (see longitudinal plan of care) Basic overview and discussion of pathophysiology of Heart Failure reviewed  Determined patient is currently at the Cardiologist office for evaluation of increased shortness of breath and edema Reviewed and discussed MD's recommendation for hospital admission for CHF exacerbation  Discussed patient will be admitted to [facility] Discussed that daughter Leslie Reeves will contact this RN Care Manager once [name] has been discharged home  Discussed plans with patient  for ongoing care management follow up and provided patient with direct contact information for care management team Self-Care Activities: Daughter verbalizes understanding of plan to contact embedded RN Care Manager upon patient's discharge home from [facility] Patient Goals: Pending inpatient admission   Follow Up Plan: The care management team will reach out to the patient/daughter upon her discharge home from inpatient admission to [facility]     Follow Up Plan: The care management team will reach out to the patient/daughter upon her discharge home from inpatient admission to [place], RN, BSN, [place] [facility]  Direct Phone: [phone]

## 2021-07-15 NOTE — Patient Instructions (Signed)
Medication Instructions:  Your Physician recommend you continue on your current medication as directed.    *If you need a refill on your cardiac medications before your next appointment, please call your pharmacy*   Lab Work: None ordered today   Testing/Procedures: None ordered today   Follow-Up: At [facility], you and your health needs are our priority.  As part of our continuing mission to provide you with exceptional heart care, we have created designated Provider Care Teams.  These Care Teams include your primary Cardiologist (physician) and Advanced Practice Providers (APPs -  Physician Assistants and Nurse Practitioners) who all work together to provide you with the care you need, when you need it.  We recommend signing up for the patient portal called "MyChart".  Sign up information is provided on this After Visit Summary.  MyChart is used to connect with patients for Virtual Visits (Telemedicine).  Patients are able to view lab/test results, encounter notes, upcoming appointments, etc.  Non-urgent messages can be sent to your provider as well.   To learn more about what you can do with MyChart, go to [email or web address].    Your next appointment:   1 month(s)  The format for your next appointment:   In Person  Provider:   Phineas Inches, MD  Dr. Harl Bowie is recommending you be admitted to the hospital for further evaluations. Someone will contact you once your bed is available.

## 2021-07-15 NOTE — Patient Instructions (Signed)
Visit Information  PATIENT GOALS:  Goals Addressed      Heart Failure Symptom Exacerbation prevented or minimized   Not on track    Timeframe:  Short-Term Goal Priority:  High Start Date:  07/15/21                           Expected End Date:  10/15/21  Follow Up Plan: The care management team will reach out to the patient/daughter upon her discharge home from inpatient admission to [facility]               Patient Goals: Pending inpatient admission                 The patient verbalized understanding of instructions, educational materials, and care plan provided today and declined offer to receive copy of patient instructions, educational materials, and care plan.   Follow Up Plan: The care management team will reach out to the patient/daughter upon her discharge home from inpatient admission to [facility], RN, BSN, [place] [facility]  Direct Phone: [phone]

## 2021-07-15 NOTE — Consult Note (Addendum)
[place] WHQ:[id number] DOB: 06-06-1941 DOA: 07/15/2021 PCP: [name], FNP   Requesting physician: Dr. Cleatrice Burke Date of consultation: 19/19/2022 Reason for consultation: COPD management  Impression/Recommendations  Dyspnea/Acute on chronic anemia -pt initially thought to be in acute diastolic HF but she appears dry on exam, with worsening renal function and weight decease from last admission (44.5kg down to 41kg). Suspect over-diuresis.  -Hgb of 8.8 down from baseline of around 11.  Suspect dyspnea from symptomatic anemia. -she denies melena. Check iron panel, vitamin K59 and folic. Recheck CBC in the morning with threshold for transfusion of Hgb <7.   AKI -creatinine of 2.47 from a prior of 2.28 -Hold Lasix for now and monitor creatinine closely -Gentle IV fluid hydration per cardiology  COPD -no in acute exacerbation.  -Continue PRN albuterol and home bronchodilator   Hypertension Continue amlodipine, BiDil  Hyperlipidemia Continue rosuvastatin  Pre-diabetes -Controlled with last hemoglobin A1c in September of 5.8  Hypothyroidism -Continue levothyroxine.  TSH is normal.  Ongoing weight loss with failure to thrive - Continue Megace and mirtazapine  I will followup again tomorrow. Please contact me if I can be of assistance in the meanwhile. Thank you for this consultation.  Chief Complaint: shortness of breath   HPI:  Leslie Reeves is an 80 year old female with COPD/mild intermittent asthma, history of CVA, chronic diastolic heart failure, prediabetes, CKD stage IIIa, and hypothyroidism who presents as a direct admission from cardiology for shortness of breath suspected to be CHF exacerbation.  Patient reports that she presented to cardiology for the first time today with symptoms of 1 week of increasing shortness of breath.  This is worse with exertion and better at rest.  She normally sleeps on a recliner so she  is unsure if she would have shortness of breath if laying flat.  She reports having lower extremity edema about a week ago but improved when she elevated her feet at night.  Reports compliance with her Lasix.  Denies any cough or runny nose.  No fever.  No wheezing.  Denies current tobacco use.  Patient is not on oxygen at home.  She reports using her albuterol about once this week for shortness of breath with some relief.  Denies any recent surgeries. Endorses good appetite but reports that she has lost at least 26 pounds she thinks over several months.  Of note, patient was recently hospitalized in September for acute diastolic heart failure and was started on low-dose 20 mg every other day Lasix.  She also had acute on chronic CKD.    Review of Systems:  Constitutional: No Weight Change, No Fever ENT/Mouth: No sore throat, No Rhinorrhea Eyes: No Eye Pain, No Vision Changes Cardiovascular: No Chest Pain, no SOB, No PND, No Dyspnea on Exertion, No Orthopnea, No Claudication, No Edema, No Palpitations Respiratory: No Cough, No Sputum, No Wheezing, no Dyspnea  Gastrointestinal: No Nausea, No Vomiting, No Diarrhea, No Constipation, No Pain Genitourinary: no Urinary Incontinence, No Urgency, No Flank Pain Musculoskeletal: No Arthralgias, No Myalgias Skin: No Skin Lesions, No Pruritus, Neuro: no Weakness, No Numbness,  No Loss of Consciousness, No Syncope Psych: No Anxiety/Panic, No Depression, no decrease appetite Heme/Lymph: No Bruising, No Bleeding  Social Hx Patient lives at home with her daughter.  She has history of 5 years half a pack per day tobacco use but quit 6 years ago.  Denies alcohol or illicit drug use.  Past Medical History:  Diagnosis Date   Allergy  Diabetes mellitus without complication ([place])    Hypertension    Past Surgical History:  Procedure Laterality Date   ABDOMINAL HYSTERECTOMY      Allergies  Allergen Reactions   Penicillin G Rash   Penicillins Rash    Family History  Problem Relation Age of Onset   Heart disease Other    Stroke Other    Hypertension Other    COPD Other    Cancer Other    Asthma Other    Breast cancer Neg Hx     Prior to Admission medications   Medication Sig Start Date End Date Taking? Authorizing Provider  Accu-Chek Softclix Lancets lancets Use as instructed 03/04/21   [name], FNP  albuterol (VENTOLIN HFA) 108 (90 Base) MCG/ACT inhaler INHALE 2 PUFFS BY MOUTH 4 TIMES A DAY Patient taking differently: Inhale 2 puffs into the lungs every 6 (six) hours as needed for shortness of breath or wheezing. 03/12/21   Icard, Leory Plowman L, DO  amLODipine (NORVASC) 10 MG tablet TAKE 1 TABLET BY MOUTH EVERY DAY 06/17/21   [name], FNP  aspirin (BAYER ASPIRIN) 325 MG tablet Take 1 tablet (325 mg total) by mouth daily. 03/09/21 03/09/22  [name], FNP  blood glucose meter kit and supplies KIT Dispense based on patient and insurance preference. Use up to four times daily as directed. (FOR ICD-9 250.00, 250.01). 11/23/18   [name], FNP  diclofenac sodium (VOLTAREN) 1 % GEL Apply 2 g topically 4 (four) times daily. Patient taking differently: Apply 2 g topically 4 (four) times daily as needed (pain). 02/15/19   [name], FNP  fluticasone-salmeterol (WIXELA INHUB) 100-50 MCG/ACT AEPB TAKE 1 PUFF BY MOUTH EVERY 12 HOURS IN THE MORNING AND IN THE EVENING 03/12/21   Icard, Bradley L, DO  furosemide (LASIX) 40 MG tablet Take 40 mg by mouth.    [provider]  glucose blood (ACCU-CHEK AVIVA PLUS) test strip Use as instructed 11/27/18   [name], FNP  isosorbide-hydrALAZINE (BIDIL) 20-37.5 MG tablet Take 1 tablet by mouth 3 (three) times daily. 06/13/21   Debbe Odea, MD  levothyroxine (SYNTHROID) 25 MCG tablet Take 1.5 tablets (37.5 mcg total) by mouth daily before breakfast. 06/17/21   [name], FNP  megestrol (MEGACE ES) 625 MG/5ML suspension TAKE 5 MLS (625 MG TOTAL) BY MOUTH DAILY. 06/26/21   [name], FNP  megestrol (MEGACE) 40 MG/ML suspension SMARTSIG:9.1 Milliliter(s) By Mouth Daily 06/26/21   [provider]  mirtazapine (REMERON) 15 MG tablet TAKE 1 TABLET BY MOUTH EVERYDAY AT BEDTIME Patient taking differently: Take 15 mg by mouth at bedtime. 02/27/21   [name], FNP  mometasone-formoterol (DULERA) 100-5 MCG/ACT AERO Inhale 2 puffs into the lungs 2 (two) times daily. 07/01/21   [name], FNP  potassium chloride SA (KLOR-CON) 20 MEQ tablet Take 1 tablet (20 mEq total) by mouth every other day. Must be taken with Lasix/ Furosemide 06/13/21   Debbe Odea, MD  rosuvastatin (CRESTOR) 20 MG tablet TAKE 1 TABLET BY MOUTH EVERY DAY Patient taking differently: Take 20 mg by mouth daily. 06/05/21   [name], FNP   Physical Exam: Blood pressure (!) 151/58, pulse 74, temperature 98.5 F (36.9 C), temperature source Oral, resp. rate 17, height _0  (1.549 m), weight 40.2 kg, SpO2 94 %. Vitals:   07/15/21 1801 07/15/21 1836  BP: (!) 151/58   Pulse: 74   Resp:  17  Temp: 98.5 F (36.9 C)   SpO2: 94%  General: Calm comfortable elderly female appearing younger than stated age laying at approximately 30 degree incline in bed Eyes: ids and conjunctivae normal ENMT: Mucous membranes are moist.  Neck: normal, supple Respiratory: clear to auscultation bilaterally, no wheezing, no crackles. Normal respiratory effort. No accessory muscle use.  Cardiovascular: Regular rate and rhythm, no murmurs / rubs / gallops. No extremity edema.   Abdomen: Soft, no tenderness, no masses palpated. Bowel sounds positive.  Musculoskeletal: no clubbing / cyanosis. No joint deformity upper and lower extremities. Normal muscle tone.  Skin: no rashes, lesions, ulcers. No induration Neurologic: CN 2-12 grossly intact.  Strength 5 out of 5 of all 4 extremities  psychiatric: . Alert and oriented x 4 . Normal mood.   Labs on Admission:  Basic Metabolic Panel: No results for input(s): NA,  K, CL, CO2, GLUCOSE, BUN, CREATININE, CALCIUM, MG, PHOS in the last 168 hours. Liver Function Tests: No results for input(s): AST, ALT, ALKPHOS, BILITOT, PROT, ALBUMIN in the last 168 hours. No results for input(s): LIPASE, AMYLASE in the last 168 hours. No results for input(s): AMMONIA in the last 168 hours. CBC: No results for input(s): WBC, NEUTROABS, HGB, HCT, MCV, PLT in the last 168 hours. Cardiac Enzymes: No results for input(s): CKTOTAL, CKMB, CKMBINDEX, TROPONINI in the last 168 hours. BNP: Invalid input(s): POCBNP CBG: No results for input(s): GLUCAP in the last 168 hours.  Radiological Exams on Admission: No results found.    Time spent: Greater than 45 minutes evaluating patient at bedside, reviewing imaging, documentation, lab work and coordination of care.  Orene Desanctis [facility]   If 7PM-7AM, please contact night-coverage [email or web address]  07/15/2021, 7:39 PM

## 2021-07-15 NOTE — H&P (Signed)
Cardiology Admission History and Physical:   Patient ID: Leslie Reeves MRN: [id number]; DOB: 12/12/1940   Admission date: 07/15/2021  PCP:  [name], [facility] Providers Cardiologist:  Phineas Inches, MD    Chief Complaint:  worsening dyspnea, failure to thrive   Patient Profile:   Leslie Reeves is a 80 y.o. female with severe COPD, chronic diastolic congestive heart failure who is being seen 07/15/2021 for the evaluation of progressive shortness of breath, weakness and failure to thrive.[name]  History of Present Illness:   [name] is an 80 year old female with severe COPD, pulmonary hypertension, chronic diastolic congestive heart failure.   She was recently admitted to the hospital in late July with volume overload, JVD and leg edema.  She was discharged with Lasix.  Over the past week or so the Lasix has not been resulting in any significant diuresis.  She has become weaker and weaker.  She spends most of the day in bed.  Her discharge weight on September 17 was 44.5 kg which is 97.9 pounds.  Her weight today is 40.2 kg.  Her daughter has been preparing her meals for the past several weeks.  She has been using a low-salt diet.  Noheli's appetite has been fairly poor but has improved with the use of Megace which was prescribed by her primary medical doctor.    Past Medical History:  Diagnosis Date   Allergy    Diabetes mellitus without complication ([place])    Hypertension     Past Surgical History:  Procedure Laterality Date   ABDOMINAL HYSTERECTOMY       Medications Prior to Admission: Prior to Admission medications   Medication Sig Start Date End Date Taking? Authorizing Provider  Accu-Chek Softclix Lancets lancets Use as instructed 03/04/21   [name], FNP  albuterol (VENTOLIN HFA) 108 (90 Base) MCG/ACT inhaler INHALE 2 PUFFS BY MOUTH 4 TIMES A DAY Patient taking differently: Inhale 2 puffs into the lungs every 6 (six) hours as needed for shortness  of breath or wheezing. 03/12/21   Icard, Leory Plowman L, DO  amLODipine (NORVASC) 10 MG tablet TAKE 1 TABLET BY MOUTH EVERY DAY 06/17/21   [name], FNP  aspirin (BAYER ASPIRIN) 325 MG tablet Take 1 tablet (325 mg total) by mouth daily. 03/09/21 03/09/22  [name], FNP  blood glucose meter kit and supplies KIT Dispense based on patient and insurance preference. Use up to four times daily as directed. (FOR ICD-9 250.00, 250.01). 11/23/18   [name], FNP  diclofenac sodium (VOLTAREN) 1 % GEL Apply 2 g topically 4 (four) times daily. Patient taking differently: Apply 2 g topically 4 (four) times daily as needed (pain). 02/15/19   [name], FNP  fluticasone-salmeterol (WIXELA INHUB) 100-50 MCG/ACT AEPB TAKE 1 PUFF BY MOUTH EVERY 12 HOURS IN THE MORNING AND IN THE EVENING 03/12/21   Icard, Bradley L, DO  furosemide (LASIX) 40 MG tablet Take 40 mg by mouth.    [provider]  glucose blood (ACCU-CHEK AVIVA PLUS) test strip Use as instructed 11/27/18   [name], FNP  isosorbide-hydrALAZINE (BIDIL) 20-37.5 MG tablet Take 1 tablet by mouth 3 (three) times daily. 06/13/21   Debbe Odea, MD  levothyroxine (SYNTHROID) 25 MCG tablet Take 1.5 tablets (37.5 mcg total) by mouth daily before breakfast. 06/17/21   [name], FNP  megestrol (MEGACE ES) 625 MG/5ML suspension TAKE 5 MLS (625 MG TOTAL) BY MOUTH DAILY. 06/26/21   [name], FNP  megestrol (MEGACE)  40 MG/ML suspension SMARTSIG:9.1 Milliliter(s) By Mouth Daily 06/26/21   [provider]  mirtazapine (REMERON) 15 MG tablet TAKE 1 TABLET BY MOUTH EVERYDAY AT BEDTIME Patient taking differently: Take 15 mg by mouth at bedtime. 02/27/21   [name], FNP  mometasone-formoterol (DULERA) 100-5 MCG/ACT AERO Inhale 2 puffs into the lungs 2 (two) times daily. 07/01/21   [name], FNP  potassium chloride SA (KLOR-CON) 20 MEQ tablet Take 1 tablet (20 mEq total) by mouth every other day. Must be taken with Lasix/ Furosemide  06/13/21   Debbe Odea, MD  rosuvastatin (CRESTOR) 20 MG tablet TAKE 1 TABLET BY MOUTH EVERY DAY Patient taking differently: Take 20 mg by mouth daily. 06/05/21   [name], FNP     Allergies:    Allergies  Allergen Reactions   Penicillin G Rash   Penicillins Rash    Social History:   Social History   Socioeconomic History   Marital status: Married    Spouse name: Not on file   Number of children: 2   Years of education: Not on file   Highest education level: High school graduate  Occupational History   Occupation: retired  Tobacco Use   Smoking status: Former    Packs/day: 0.50    Years: 50.00    Pack years: 25.00    Types: Cigarettes    Quit date: 2017    Years since quitting: 5.8   Smokeless tobacco: Never  Vaping Use   Vaping Use: Never used  Substance and Sexual Activity   Alcohol use: Not Currently   Drug use: Not Currently   Sexual activity: Not Currently  Other Topics Concern   Not on file  Social History Narrative   Not on file   Social Determinants of Health   Financial Resource Strain: Low Risk    Difficulty of Paying Living Expenses: Not hard at all  Food Insecurity: No Food Insecurity   Worried About Charity fundraiser in the Last Year: Never true   [place] in the Last Year: Never true  Transportation Needs: No Transportation Needs   Lack of Transportation (Medical): No   Lack of Transportation (Non-Medical): No  Physical Activity: Inactive   Days of Exercise per Week: 0 days   Minutes of Exercise per Session: 0 min  Stress: No Stress Concern Present   Feeling of Stress : Not at all  Social Connections: Not on file  Intimate Partner Violence: Not on file    Family History:   The patient's family history includes Asthma in an other family member; COPD in an other family member; Cancer in an other family member; Heart disease in an other family member; Hypertension in an other family member; Stroke in an other family member. There  is no history of Breast cancer.    ROS:  Please see the history of present illness.  All other ROS reviewed and negative.     Physical Exam/Data:   Vitals:   07/15/21 1801 07/15/21 1836  BP: (!) 151/58   Pulse: 74   Resp:  17  Temp: 98.5 F (36.9 C)   TempSrc: Oral   SpO2: 94%   Weight:  40.2 kg  Height:  _0  (1.549 m)   No intake or output data in the 24 hours ending 07/15/21 1840 Last 3 Weights 07/15/2021 07/15/2021 07/13/2021  Weight (lbs) 88 lb 10 oz 91 lb 3.2 oz 90 lb 9.6 oz  Weight (kg) 40.2 kg 41.368 kg 41.096  kg     Body mass index is 16.75 kg/m.  General: Chronically ill-appearing, cachectic, elderly female, no acute distress.  She appears to be very weak. HEENT: normal Neck: no JVD Vascular: No carotid bruits; Distal pulses 2+ bilaterally   Cardiac:  normal S1, S2; RRR;   Lungs:   Greatly diminished breath sounds. Abd: soft, nontender, no hepatomegaly  Ext: Her legs are extremely thin.  There is no edema. Musculoskeletal:  No deformities, she was able to sit up in bed.  Otherwise she is fairly weak. Skin: warm and dry  Neuro:  CNs 2-12 intact, no focal abnormalities noted Psych:  Normal affect    EKG: Normal sinus rhythm with no ST or T wave changes.  Relevant CV Studies:   Laboratory Data:  High Sensitivity Troponin:  No results for input(s): TROPONINIHS in the last 720 hours.    ChemistryNo results for input(s): NA, K, CL, CO2, GLUCOSE, BUN, CREATININE, CALCIUM, MG, GFRNONAA, GFRAA, ANIONGAP in the last 168 hours.  No results for input(s): PROT, ALBUMIN, AST, ALT, ALKPHOS, BILITOT in the last 168 hours. Lipids No results for input(s): CHOL, TRIG, HDL, LABVLDL, LDLCALC, CHOLHDL in the last 168 hours. HematologyNo results for input(s): WBC, RBC, HGB, HCT, MCV, MCH, MCHC, RDW, PLT in the last 168 hours. Thyroid No results for input(s): TSH, FREET4 in the last 168 hours. BNPNo results for input(s): BNP, PROBNP in the last 168 hours.  DDimer No results  for input(s): DDIMER in the last 168 hours.   Radiology/Studies:  No results found.   Assessment and Plan:   Profound weakness/failure to thrive: Fara has a history of chronic diastolic congestive heart failure but I suspect that she is volume depleted and that this is the reason for her profound weakness, shortness of breath and failure to thrive.  She does not appear to be volume overloaded to me at all.  Her weight is down 4 kg from her dry weight from several weeks ago at the time of her discharge. Her daughter has been making sure that she takes her medicine every day and she is also been eating a low-salt diet. We will hold her Lasix. Check tsh  I think she may benefit from gentle hydration.  2.  COPD: She has severe COPD.  She has evidence of wasting.  She has pursed lips breathing at baseline.  She has pulmonary hypertension.  I will ask our [facility] hospitalist colleagues to help [place] manage this.  3.  Failure to thrive: Mahlia has been losing weight for quite some time.  Her daughter has been able to put on a little bit of weight recently with the help of Megace but floor his overall condition has been on a decline for the past many months.  I suspect this is due to her severe COPD and perhaps other issues.   Risk Assessment/Risk Scores:     Severity of Illness: The appropriate patient status for this patient is INPATIENT. Inpatient status is judged to be reasonable and necessary in order to provide the required intensity of service to ensure the patient's safety. The patient's presenting symptoms, physical exam findings, and initial radiographic and laboratory data in the context of their chronic comorbidities is felt to place them at high risk for further clinical deterioration. Furthermore, it is not anticipated that the patient will be medically stable for discharge from the hospital within 2 midnights of admission.   * I certify that at the point of admission it is my clinical  judgment that the patient will require inpatient hospital care spanning beyond 2 midnights from the point of admission due to high intensity of service, high risk for further deterioration and high frequency of surveillance required.*   For questions or updates, please contact [place] Please consult [email or web address] for contact info under     Signed, Mertie Moores, MD  07/15/2021 6:40 PM

## 2021-07-16 ENCOUNTER — Observation Stay (HOSPITAL_COMMUNITY): Payer: Medicare Other

## 2021-07-16 DIAGNOSIS — R0600 Dyspnea, unspecified: Secondary | ICD-10-CM

## 2021-07-16 DIAGNOSIS — R54 Age-related physical debility: Secondary | ICD-10-CM | POA: Diagnosis not present

## 2021-07-16 DIAGNOSIS — E038 Other specified hypothyroidism: Secondary | ICD-10-CM

## 2021-07-16 DIAGNOSIS — R627 Adult failure to thrive: Secondary | ICD-10-CM | POA: Diagnosis present

## 2021-07-16 DIAGNOSIS — J449 Chronic obstructive pulmonary disease, unspecified: Secondary | ICD-10-CM

## 2021-07-16 DIAGNOSIS — E1122 Type 2 diabetes mellitus with diabetic chronic kidney disease: Secondary | ICD-10-CM | POA: Diagnosis not present

## 2021-07-16 DIAGNOSIS — J410 Simple chronic bronchitis: Secondary | ICD-10-CM

## 2021-07-16 DIAGNOSIS — I5032 Chronic diastolic (congestive) heart failure: Secondary | ICD-10-CM | POA: Diagnosis not present

## 2021-07-16 DIAGNOSIS — I2723 Pulmonary hypertension due to lung diseases and hypoxia: Secondary | ICD-10-CM | POA: Diagnosis not present

## 2021-07-16 DIAGNOSIS — D631 Anemia in chronic kidney disease: Secondary | ICD-10-CM | POA: Diagnosis not present

## 2021-07-16 DIAGNOSIS — E869 Volume depletion, unspecified: Secondary | ICD-10-CM | POA: Diagnosis not present

## 2021-07-16 DIAGNOSIS — E43 Unspecified severe protein-calorie malnutrition: Secondary | ICD-10-CM | POA: Diagnosis not present

## 2021-07-16 DIAGNOSIS — J42 Unspecified chronic bronchitis: Secondary | ICD-10-CM | POA: Diagnosis not present

## 2021-07-16 DIAGNOSIS — E039 Hypothyroidism, unspecified: Secondary | ICD-10-CM

## 2021-07-16 DIAGNOSIS — D62 Acute posthemorrhagic anemia: Secondary | ICD-10-CM | POA: Diagnosis not present

## 2021-07-16 DIAGNOSIS — R0609 Other forms of dyspnea: Secondary | ICD-10-CM | POA: Diagnosis not present

## 2021-07-16 DIAGNOSIS — E1169 Type 2 diabetes mellitus with other specified complication: Secondary | ICD-10-CM

## 2021-07-16 DIAGNOSIS — E785 Hyperlipidemia, unspecified: Secondary | ICD-10-CM

## 2021-07-16 DIAGNOSIS — I130 Hypertensive heart and chronic kidney disease with heart failure and stage 1 through stage 4 chronic kidney disease, or unspecified chronic kidney disease: Secondary | ICD-10-CM | POA: Diagnosis not present

## 2021-07-16 DIAGNOSIS — I2781 Cor pulmonale (chronic): Secondary | ICD-10-CM | POA: Diagnosis not present

## 2021-07-16 DIAGNOSIS — J9601 Acute respiratory failure with hypoxia: Secondary | ICD-10-CM | POA: Diagnosis not present

## 2021-07-16 DIAGNOSIS — I10 Essential (primary) hypertension: Secondary | ICD-10-CM | POA: Diagnosis not present

## 2021-07-16 DIAGNOSIS — N179 Acute kidney failure, unspecified: Secondary | ICD-10-CM | POA: Diagnosis not present

## 2021-07-16 DIAGNOSIS — Z20822 Contact with and (suspected) exposure to covid-19: Secondary | ICD-10-CM | POA: Diagnosis not present

## 2021-07-16 LAB — BASIC METABOLIC PANEL
Anion gap: 9 (ref 5–15)
BUN: 82 mg/dL — ABNORMAL HIGH (ref 8–23)
CO2: 24 mmol/L (ref 22–32)
Calcium: 8.8 mg/dL — ABNORMAL LOW (ref 8.9–10.3)
Chloride: 111 mmol/L (ref 98–111)
Creatinine, Ser: 2.33 mg/dL — ABNORMAL HIGH (ref 0.44–1.00)
GFR, Estimated: 21 mL/min — ABNORMAL LOW (ref >60)
Glucose, Bld: 81 mg/dL (ref 70–99)
Potassium: 3.6 mmol/L (ref 3.5–5.1)
Sodium: 144 mmol/L (ref 135–145)

## 2021-07-16 LAB — IRON AND TIBC
Iron: 32 ug/dL (ref 28–170)
Saturation Ratios: 11 % (ref 10.4–31.8)
TIBC: 288 ug/dL (ref 250–450)
UIBC: 256 ug/dL

## 2021-07-16 LAB — CBC
HCT: 24.7 % — ABNORMAL LOW (ref 36.0–46.0)
Hemoglobin: 7.7 g/dL — ABNORMAL LOW (ref 12.0–15.0)
MCH: 26.4 pg (ref 26.0–34.0)
MCHC: 31.2 g/dL (ref 30.0–36.0)
MCV: 84.6 fL (ref 80.0–100.0)
Platelets: 274 10*3/uL (ref 150–400)
RBC: 2.92 MIL/uL — ABNORMAL LOW (ref 3.87–5.11)
RDW: 24.6 % — ABNORMAL HIGH (ref 11.5–15.5)
WBC: 4.4 10*3/uL (ref 4.0–10.5)
nRBC: 0 % (ref 0.0–0.2)

## 2021-07-16 LAB — BRAIN NATRIURETIC PEPTIDE: BNP: 50.9 pg/mL (ref 0.0–100.0)

## 2021-07-16 LAB — FOLATE: Folate: 36 ng/mL (ref >5.9)

## 2021-07-16 LAB — ECHOCARDIOGRAM LIMITED
Area-P 1/2: 3.37 cm2
Height: 61 in
S' Lateral: 2.4 cm
Weight: 1446.39 oz

## 2021-07-16 LAB — FERRITIN: Ferritin: 17 ng/mL (ref 11–307)

## 2021-07-16 LAB — VITAMIN B12: Vitamin B-12: 324 pg/mL (ref 180–914)

## 2021-07-16 LAB — T4: T4, Total: 7.7 ug/dL (ref 4.5–12.0)

## 2021-07-16 LAB — T3, FREE: T3, Free: 1.1 pg/mL — ABNORMAL LOW (ref 2.0–4.4)

## 2021-07-16 LAB — TSH: TSH: 1.66 u[IU]/mL (ref 0.450–4.500)

## 2021-07-16 MED ORDER — ADULT MULTIVITAMIN W/MINERALS CH
1.0000 | ORAL_TABLET | Freq: Every day | ORAL | Status: DC
  Administered 2021-07-16 – 2021-07-24 (×9): 1 via ORAL
  Filled 2021-07-16 (×9): qty 1

## 2021-07-16 MED ORDER — SODIUM CHLORIDE 0.9 % IV SOLN: INTRAVENOUS | Status: AC

## 2021-07-16 MED ORDER — ENSURE ENLIVE PO LIQD
237.0000 mL | Freq: Two times a day (BID) | ORAL | Status: DC
  Administered 2021-07-16 – 2021-07-23 (×14): 237 mL via ORAL

## 2021-07-16 MED ORDER — ROSUVASTATIN CALCIUM 5 MG PO TABS
10.0000 mg | ORAL_TABLET | Freq: Every day | ORAL | Status: DC
  Administered 2021-07-16 – 2021-07-24 (×9): 10 mg via ORAL
  Filled 2021-07-16 (×9): qty 2

## 2021-07-16 NOTE — Progress Notes (Signed)
Initial Nutrition Assessment  DOCUMENTATION CODES:   Underweight, Severe malnutrition in context of chronic illness  INTERVENTION:   -Ensure Enlive po BID, each supplement provides 350 kcal and 20 grams of protein  -MVI with minerals daily  NUTRITION DIAGNOSIS:   Severe Malnutrition related to chronic illness (COPD, CHF) as evidenced by severe fat depletion, severe muscle depletion.  GOAL:   Patient will meet greater than or equal to 90% of their needs  MONITOR:   PO intake, Supplement acceptance, Labs, Weight trends, Skin, I & O's  REASON FOR ASSESSMENT:   Consult Assessment of nutrition requirement/status  ASSESSMENT:   Leslie Reeves is a 80 y.o. female with severe COPD, chronic diastolic congestive heart failure who is being seen 07/15/2021 for the evaluation of progressive shortness of breath, weakness and failure to thrive..  Pt admitted with COPD exacerbation, CHF, and FTT.   Reviewed I/O's: +220 ml x 24 hours  UOP: 200 ml x 24 hours  Spoke with pt and daughter at bedside. Pt reports she is tired and deferred most of the history to her daughter. Daughter shares that pt has experienced a general decline in health over the past 1-2 years, which included weight loss and poor oral intake. Daughter shares that this worsened about 3-4 months ago and pt became very weak and withdrawn. She describes pt as a very social and active person at baseline, who enjoyed playing card games, shopping, and visiting her hometown. Now pt mainly stays in bed and is more weak. Pt often gets up without asking for help and this has resulted in some falls, especially at night.   Daughter shares that she has been more vigilant in pt's care and now cooks her 3 meals per day (Breakfast: eggs, bacon, and grits or toast OR cereal and applesauce; Lunch: chicken salad with bread or crackers and fruit; Dinner: meat, starch, and vegetable). Pt was prescribed megace about a year ago, but pt was reluctant  to take it secondary to taste. Pt has been taking it consistently over the past month, and daughter has noticed a dramatic improvement in her appetite. She also started drinking 1-2 Ensure supplements about one week ago.   Pt reports that she has gotten down to about 80#. Daughter shares that she has gotten up in the 90 pound range, but attributes this to edema. Reviewed wt hx; wt has been stable over the past 4 months.   Discussed importance of good meal and supplement intake to promote healing. Pt and daughter amenable to Ensure supplements and encouraged pt to continue use at home.   Medications reviewed and include megace, remeron, and 0.9% sodium chloride infusion @ 50 ml/hr.   Labs reviewed.  Diet Order:   Diet Order             Diet Heart Room service appropriate? Yes; Fluid consistency: Thin  Diet effective now                   EDUCATION NEEDS:   Education needs have been addressed  Skin:  Skin Assessment: Reviewed RN Assessment  Last BM:  Unknown  Height:   Ht Readings from Last 1 Encounters:  07/15/21 _0  (1.549 m)    Weight:   Wt Readings from Last 1 Encounters:  07/16/21 41 kg    Ideal Body Weight:  47.7 kg  BMI:  Body mass index is 17.08 kg/m.  Estimated Nutritional Needs:   Kcal:  1440-1640  Protein:  65-80 grams  Fluid:  > 1.4 L    Loistine Chance, RD, LDN, [place] Registered Dietitian II Certified Diabetes Care and Education Specialist Please refer to [facility] for RD and/or RD on-call/weekend/after hours pager

## 2021-07-16 NOTE — Progress Notes (Signed)
[facility] consult note                                                                              Patient Demographics  Leslie Reeves, is a 80 y.o. female, DOB - 1940/11/01, ZDG:[id number]  Admit date - 07/15/2021   Admitting Physician Jettie Booze, MD  Outpatient Primary MD for the patient is [name], [place]  Outpatient specialists:   LOS - 1  days   Medical records reviewed and are as summarized below:    No chief complaint on file.      Brief summary   Patient is a 80 year old female with COPD, CVA, chronic diastolic CHF, prediabetes, CKD , hypothyroidism presented as a direct admission from cardiology office for shortness of breath suspected to be CHF exacerbation.  Patient had reported symptoms of 1 week of increasing shortness of breath, worse with exertion and better at rest.  Normally sleeps on a recliner so was unsure if she had shortness of breath if laying flat.  Also reported lower extremity edema about a week ago but improved when she elevated her feet at night.  Reported compliance with Lasix, no wheezing, coughing or fevers.  Not on home O2, denies any current tobacco use.  Reported good appetite but has lost at least 26 pounds over several months.   Patient was recently hospitalized in September for acute diastolic CHF and was started on low-dose Lasix 20 mg every other day.  Assessment & Plan   Dyspnea with exertion, acute on chronic anemia, Symptomatic anemia -Initially thought to be from acute diastolic CHF however appeared to be somewhat volume depleted.  Also has history of COPD, acute kidney injury on CKD stage IIIb/IV, acute on chronic normocytic anemia, likely symptoms from symptomatic anemia -Lasix held, renal function improving -Baseline hemoglobin  ~11, on admission hemoglobin 8.0, this a.m. 7.7, no obvious bleeding -Anemia panel showed normal B12, folate, ferritin 17, %sat 11, Fe 32, TIBC 288 -Likely anemia of chronic  disease due to CKD stage4, may benefit from ESA outpatient, nephrology referral however will rule out any occult GI bleed -Transfuse for hemoglobin < 7 -Ordered FOBT. No obvious bleeding per the patient, if positive, will discuss with GI   Active Problems: Chronic diastolic CHF -Currently stable, not in any volume overload -Lasix held, renal function slightly better today -2D echo 9/22 showed EF of 60 to 65%, G1 DD, mild to moderate TR.  Pending echo today  COPD -Patient has underlying severe COPD, currently no active wheezing, does not use O2 at home -Continue Dulera, albuterol nebs as needed  Failure to thrive -Follow PT OT, nutrition consult, -Continue Megace, Remeron -Patient reports she lives with her family, otherwise is functional with her ADLs, alert and oriented x3  Hypothyroidism -Continue Synthroid, TSH 1.6  Acute on CKD stage IV -Baseline creatinine 1.7-1.9, creatinine was 2.28 in 05/2021 -Presented with creatinine of 2.4, Lasix held -Creatinine mildly improving today    Moderate to severe protein calorie malnutrition, underweight Estimated body mass index is 17.08 kg/m as calculated from the following:   Height as of this encounter: _0  (1.549 m).   Weight  as of this encounter: 41 kg. -Nutrition consult  Code Status: Full CODE STATUS DVT Prophylaxis:  heparin injection 5,000 Units Start: 07/15/21 2200   Level of Care: Level of care: Telemetry Cardiac Family Communication: Discussed all imaging results, lab results, explained to the patient    Disposition Plan:     Status is: Observation, per primary team  Time Spent in minutes   35 minutes  Procedures:  2D echo   Antimicrobials:   Anti-infectives (From admission, onward)    None          Medications  Scheduled Meds:  amLODipine  10 mg Oral Daily   heparin  5,000 Units Subcutaneous Q8H   isosorbide-hydrALAZINE  1 tablet Oral TID   levothyroxine  37.5 mcg Oral Q0600   megestrol  600 mg  Oral Daily   mirtazapine  15 mg Oral QHS   mometasone-formoterol  2 puff Inhalation BID   rosuvastatin  10 mg Oral Daily   Continuous Infusions: PRN Meds:.acetaminophen, albuterol, nitroGLYCERIN, ondansetron (ZOFRAN) IV      Subjective:   Leslie Reeves was seen and examined today.  No complaints per patient, states she is feeling better today.  No acute wheezing, coughing, shortness of breath.  No nausea, vomiting, hematochezia or melena.  No acute events overnight.  Objective:   Vitals:   07/16/21 0500 07/16/21 0527 07/16/21 0752 07/16/21 0933  BP:  (!) 155/69 (!) 147/57   Pulse:  71 65   Resp:  18 18   Temp:  98.7 F (37.1 C) 98.9 F (37.2 C)   TempSrc:  Oral Oral   SpO2:  99% 91% 92%  Weight: 41 kg     Height:        Intake/Output Summary (Last 24 hours) at 07/16/2021 1137 Last data filed at 07/16/2021 0808 Gross per 24 hour  Intake --  Output 200 ml  Net -200 ml     Wt Readings from Last 3 Encounters:  07/16/21 41 kg  07/15/21 41.4 kg  07/13/21 41.1 kg     Exam General: Alert and oriented x 3, NAD, frail Cardiovascular: S1 S2 auscultated, RRR Respiratory: Clear to auscultation bilaterally, no wheezing Gastrointestinal: Soft, nontender, nondistended, + bowel sounds Ext: no pedal edema bilaterally Neuro: no new deficits Psych: Normal affect and demeanor, alert and oriented x3    Data Reviewed:  I have personally reviewed following labs and imaging studies  Micro Results Recent Results (from the past 240 hour(s))  Resp Panel by RT-PCR (Flu A&B, Covid) Nasopharyngeal Swab     Status: None   Collection Time: 07/15/21  6:23 PM   Specimen: Nasopharyngeal Swab; Nasopharyngeal(NP) swabs in vial transport medium  Result Value Ref Range Status   SARS Coronavirus 2 by RT PCR NEGATIVE NEGATIVE Final    Comment: (NOTE) SARS-CoV-2 target nucleic acids are NOT DETECTED.  The SARS-CoV-2 RNA is generally detectable in upper respiratory specimens during the  acute phase of infection. The lowest concentration of SARS-CoV-2 viral copies this assay can detect is 138 copies/mL. A negative result does not preclude SARS-Cov-2 infection and should not be used as the sole basis for treatment or other patient management decisions. A negative result may occur with  improper specimen collection/handling, submission of specimen other than nasopharyngeal swab, presence of viral mutation(s) within the areas targeted by this assay, and inadequate number of viral copies(<138 copies/mL). A negative result must be combined with clinical observations, patient history, and epidemiological information. The expected result is Negative.  Fact Sheet  for Patients:  EntrepreneurPulse.com.au  Fact Sheet for Healthcare Providers:  IncredibleEmployment.be  This test is no t yet approved or cleared by the [place] FDA and  has been authorized for detection and/or diagnosis of SARS-CoV-2 by FDA under an Emergency Use Authorization (EUA). This EUA will remain  in effect (meaning this test can be used) for the duration of the COVID-19 declaration under Section 564(b)(1) of the Act, 21 U.S.C.section 360bbb-3(b)(1), unless the authorization is terminated  or revoked sooner.       Influenza A by PCR NEGATIVE NEGATIVE Final   Influenza B by PCR NEGATIVE NEGATIVE Final    Comment: (NOTE) The Xpert Xpress SARS-CoV-2/FLU/RSV plus assay is intended as an aid in the diagnosis of influenza from Nasopharyngeal swab specimens and should not be used as a sole basis for treatment. Nasal washings and aspirates are unacceptable for Xpert Xpress SARS-CoV-2/FLU/RSV testing.  Fact Sheet for Patients: EntrepreneurPulse.com.au  Fact Sheet for Healthcare Providers: IncredibleEmployment.be  This test is not yet approved or cleared by the [place] FDA and has been authorized for detection and/or  diagnosis of SARS-CoV-2 by FDA under an Emergency Use Authorization (EUA). This EUA will remain in effect (meaning this test can be used) for the duration of the COVID-19 declaration under Section 564(b)(1) of the Act, 21 U.S.C. section 360bbb-3(b)(1), unless the authorization is terminated or revoked.  Performed at [facility], [place] [address] [place], [place] [address]     Radiology Reports DG Bone Density  Result Date: 07/07/2021 EXAM: DUAL X-RAY ABSORPTIOMETRY (DXA) FOR BONE MINERAL DENSITY IMPRESSION: Referring Physician:  [name] Your patient completed a bone mineral density test using GE Lunar iDXA system (analysis version: 16). Technologist: [place] PATIENT: Name: Shyonna, Carlin Patient ID:  [id number] Birth Date: [birth year] Height:     61.0 in. Sex:         Female Measured:   07/06/2021 Weight:     98.1 lbs. Indications: Advanced Age, Bilateral Ovariectomy (65.51), Estrogen Deficient, Hysterectomy, Postmenopausal Fractures: None Treatments: None ASSESSMENT: The BMD measured at Forearm Radius 33% is 0.590 g/cm2 with a T-score of -3.4. This patient is considered OSTEOPOROTIC according to [facility]) criteria. The quality of the exam is good. The lumbar spine was excluded due to degenerative changes. Site Region Measured Date Measured Age YA BMD Significant CHANGE T-score Left Forearm Radius 33% 07/06/2021 80.4 -3.4 0.590 g/cm2 DualFemur Neck Right 07/06/2021 80.4 -2.3 0.722 g/cm2 * DualFemur Neck Right 07/03/2018 77.4 -1.4 0.845 g/cm2 DualFemur Total Mean 07/06/2021 80.4 -1.5 0.823 g/cm2 * DualFemur Total Mean 07/03/2018 77.4 -0.8 0.906 g/cm2 World Health Organization [facility]) criteria for post-menopausal, Caucasian Women: Normal       T-score at or above -1 SD Osteopenia   T-score between -1 and -2.5 SD Osteoporosis T-score at or below -2.5 SD RECOMMENDATION: 1. All patients should optimize calcium and vitamin D intake. 2. Consider FDA-approved medical therapies in  postmenopausal women and men aged 39 years and older, based on the following: a. A hip or vertebral (clinical or morphometric) fracture. b. T-score = -2.5 at the femoral neck or spine after appropriate evaluation to exclude secondary causes. c. Low bone mass (T-score between -1.0 and -2.5 at the femoral neck or spine) and a 10-year probability of a hip fracture = 3% or a 10-year probability of a major osteoporosis-related fracture = 20% based on the [place]-adapted WHO algorithm. d. Clinician judgment and/or patient preferences may indicate treatment for people with 10-year fracture probabilities above  or below these levels. FOLLOW-UP: Patients with diagnosis of osteoporosis or at high risk for fracture should have regular bone mineral density tests.? Patients eligible for Medicare are allowed routine testing every 2 years.? The testing frequency can be increased to one year for patients who have rapidly progressing disease, are receiving or discontinuing medical therapy to restore bone mass, or have additional risk factors. I have reviewed this study and agree with the findings. Mark A. Thornton Papas, M.D. [facility] Electronically Signed   By: Lavonia Dana M.D.   On: 07/07/2021 08:23   DG CHEST PORT 1 VIEW  Result Date: 07/15/2021 CLINICAL DATA:  Shortness of breath EXAM: PORTABLE CHEST 1 VIEW COMPARISON:  06/11/2021 FINDINGS: There is hyperinflation of the lungs compatible with COPD. Heart is upper limits normal in size. Aortic atherosclerosis. No confluent airspace opacities or effusions. No acute bony abnormality. IMPRESSION: COPD.  No active disease. Electronically Signed   By: Rolm Baptise M.D.   On: 07/15/2021 20:06    Lab Data:  CBC: Recent Labs  Lab 07/15/21 2012 07/16/21 0345  WBC 5.0 4.4  HGB 8.0* 7.7*  HCT 25.5* 24.7*  MCV 84.4 84.6  PLT 272 810   Basic Metabolic Panel: Recent Labs  Lab 07/15/21 2012 07/16/21 0345  NA 141 144  K 3.7 3.6  CL 107 111  CO2 24 24  GLUCOSE 96  81  BUN 85* 82*  CREATININE 2.47* 2.33*  CALCIUM 8.8* 8.8*   GFR: Estimated Creatinine Clearance: 12.5 mL/min (A) (by C-G formula based on SCr of 2.33 mg/dL (H)). Liver Function Tests: Recent Labs  Lab 07/15/21 2012  AST 23  ALT 29  ALKPHOS 38  BILITOT 0.4  PROT 5.8*  ALBUMIN 2.9*   No results for input(s): LIPASE, AMYLASE in the last 168 hours. No results for input(s): AMMONIA in the last 168 hours. Coagulation Profile: No results for input(s): INR, PROTIME in the last 168 hours. Cardiac Enzymes: No results for input(s): CKTOTAL, CKMB, CKMBINDEX, TROPONINI in the last 168 hours. BNP (last 3 results) No results for input(s): PROBNP in the last 8760 hours. HbA1C: No results for input(s): HGBA1C in the last 72 hours. CBG: No results for input(s): GLUCAP in the last 168 hours. Lipid Profile: No results for input(s): CHOL, HDL, LDLCALC, TRIG, CHOLHDL, LDLDIRECT in the last 72 hours. Thyroid Function Tests: Recent Labs    07/15/21 2012  TSH 1.690   Anemia Panel: Recent Labs    07/16/21 0345  VITAMINB12 324  FOLATE 36.0  FERRITIN 17  TIBC 288  IRON 32   Urine analysis:    Component Value Date/Time   COLORURINE YELLOW 06/12/2021 0422   APPEARANCEUR HAZY (A) 06/12/2021 0422   LABSPEC 1.009 06/12/2021 0422   PHURINE 5.0 06/12/2021 0422   GLUCOSEU NEGATIVE 06/12/2021 0422   HGBUR NEGATIVE 06/12/2021 0422   BILIRUBINUR Negative 07/13/2021 1654   KETONESUR NEGATIVE 06/12/2021 0422   PROTEINUR Positive (A) 07/13/2021 1654   PROTEINUR 100 (A) 06/12/2021 0422   UROBILINOGEN 0.2 07/13/2021 1654   NITRITE Negative 07/13/2021 1654   NITRITE NEGATIVE 06/12/2021 0422   LEUKOCYTESUR Negative 07/13/2021 1654   LEUKOCYTESUR NEGATIVE 06/12/2021 0422     Kingsten Enfield M.D. [facility] 07/16/2021, 11:37 AM  Available via Epic secure chat 7am-7pm After 7 pm, please refer to night coverage provider listed on amion.

## 2021-07-16 NOTE — Consult Note (Signed)
[facility] Inpatient Consult   07/16/2021  ESMA KILTS 1941-08-24 [id number]  [place] Organization [ACO] Patient: [place] Medicare  [place] Care Provider:  [name], FNP, an Embedded provider at [facility]   Patient was screened for observation/admission. Patient is active in the  Embedded practice with chronic care management Embedded Care Management team.  Patient was discussed in morning progression meeting.  Plan: To make inpatient [facility] team aware of active with Embedded Care Management team and following for post hospital transitional needs.  Please contact for further questions,  Natividad Brood, RN BSN [facility] Liaison  [phone] business mobile phone Toll free office [phone]  Fax number: [phone] [email or web address]

## 2021-07-16 NOTE — Progress Notes (Signed)
  Echocardiogram 2D Echocardiogram has been performed.  Leslie Reeves 07/16/2021, 10:54 AM

## 2021-07-16 NOTE — Progress Notes (Addendum)
Progress Note  Patient Name: Leslie Reeves Date of Encounter: 07/16/2021  [facility] Cardiologist: None   Subjective   Feels a little better this morning. Denies any SOB or CP.   Inpatient Medications    Scheduled Meds:  amLODipine  10 mg Oral Daily   heparin  5,000 Units Subcutaneous Q8H   isosorbide-hydrALAZINE  1 tablet Oral TID   levothyroxine  37.5 mcg Oral Q0600   megestrol  600 mg Oral Daily   mirtazapine  15 mg Oral QHS   mometasone-formoterol  2 puff Inhalation BID   rosuvastatin  10 mg Oral Daily   Continuous Infusions:  PRN Meds: acetaminophen, albuterol, nitroGLYCERIN, ondansetron (ZOFRAN) IV   Vital Signs    Vitals:   07/16/21 0527 07/16/21 0752 07/16/21 0933 07/16/21 1213  BP: (!) 155/69 (!) 147/57  (!) 155/59  Pulse: 71 65  68  Resp: _0 Temp: 98.7 F (37.1 C) 98.9 F (37.2 C)  98.5 F (36.9 C)  TempSrc: Oral Oral  Oral  SpO2: 99% 91% 92% 93%  Weight:      Height:        Intake/Output Summary (Last 24 hours) at 07/16/2021 1318 Last data filed at 07/16/2021 0808 Gross per 24 hour  Intake --  Output 200 ml  Net -200 ml   Last 3 Weights 07/16/2021 07/16/2021 07/15/2021  Weight (lbs) 90 lb 6.4 oz 90 lb 6.4 oz 88 lb 10 oz  Weight (kg) 41.005 kg 41.005 kg 40.2 kg      Telemetry    NSR without significant ventricular ectopy - Personally Reviewed  ECG    NSR without significant ST-T wave changes - Personally Reviewed  Physical Exam   GEN: No acute distress. Cachectic, frail Neck: No JVD Cardiac: RRR, no murmurs, rubs, or gallops.  Respiratory: Clear to auscultation bilaterally. GI: Soft, nontender, non-distended  MS: No edema; No deformity. Neuro:  Nonfocal  Psych: Normal affect   Labs    High Sensitivity Troponin:  No results for input(s): TROPONINIHS in the last 720 hours.   Chemistry Recent Labs  Lab 07/15/21 2012 07/16/21 0345  NA 141 144  K 3.7 3.6  CL 107 111  CO2 24 24  GLUCOSE 96 81  BUN 85* 82*   CREATININE 2.47* 2.33*  CALCIUM 8.8* 8.8*  PROT 5.8*  --   ALBUMIN 2.9*  --   AST 23  --   ALT 29  --   ALKPHOS 38  --   BILITOT 0.4  --   GFRNONAA 19* 21*  ANIONGAP 10 9    Lipids No results for input(s): CHOL, TRIG, HDL, LABVLDL, LDLCALC, CHOLHDL in the last 168 hours.  Hematology Recent Labs  Lab 07/15/21 2012 07/16/21 0345  WBC 5.0 4.4  RBC 3.02* 2.92*  HGB 8.0* 7.7*  HCT 25.5* 24.7*  MCV 84.4 84.6  MCH 26.5 26.4  MCHC 31.4 31.2  RDW 24.9* 24.6*  PLT 272 274   Thyroid  Recent Labs  Lab 07/15/21 2012  TSH 1.690    BNPNo results for input(s): BNP, PROBNP in the last 168 hours.  DDimer No results for input(s): DDIMER in the last 168 hours.   Radiology    DG CHEST PORT 1 VIEW  Result Date: 07/15/2021 CLINICAL DATA:  Shortness of breath EXAM: PORTABLE CHEST 1 VIEW COMPARISON:  06/11/2021 FINDINGS: There is hyperinflation of the lungs compatible with COPD. Heart is upper limits normal in size. Aortic atherosclerosis. No confluent airspace opacities  or effusions. No acute bony abnormality. IMPRESSION: COPD.  No active disease. Electronically Signed   By: Rolm Baptise M.D.   On: 07/15/2021 20:06    Cardiac Studies   Echo 06/12/2021 IMPRESSIONS     1. Left ventricular ejection fraction, by estimation, is 60 to 65%. The  left ventricle has normal function. The left ventricle has no regional  wall motion abnormalities. Left ventricular diastolic parameters are  consistent with Grade I diastolic  dysfunction (impaired relaxation).   2. Right ventricular systolic function is normal. The right ventricular  size is normal. There is moderately elevated pulmonary artery systolic  pressure.   3. The mitral valve is normal in structure. Mild mitral valve  regurgitation. No evidence of mitral stenosis.   4. Tricuspid valve regurgitation is mild to moderate.   5. The aortic valve is tricuspid. Aortic valve regurgitation is not  visualized. No aortic stenosis is present.    6. The inferior vena cava is normal in size with greater than 50%  respiratory variability, suggesting right atrial pressure of 3 mmHg.   Comparison(s): No prior Echocardiogram.   Patient Profile     80 y.o. female with PMH of severe COPD, chronic diastolic heart failure, pulmonary hypertension presented with failure to thrive and worsening dyspnea. Patient was previously admitted in Sept with volume overload and was discharged on lasix. She reported no significant urine output with lasix in the past week. She was seen in the office and directly admitted to the hospital. On arrival, she actually weighs 4 kg lighter than her discharge weight in September. Cr 2.47 which is higher than her previous baseline. She also has quite significant anemia with hgb 8.0, internal medicine service consulted.   Assessment & Plan    Profound weakness   - Echo 06/12/2021 EF 60-65%, grade 1 DD, moderately elevated PASP, mild MR, mild to moderate TR  - admitted with CHF in Sept and was discharged on lasix 9m every other day.  - admission weight is 4 kg lower than previous dry weight in September - lasix was held, soft IV hydration, given 736mhr of IVF, IVF stopped around 2AM this morning. Will discuss with MD, since renal function is improving with IV hydration, would like to continue her on 5063mr IVF (given her small size) and see how she does  COPD: no acute exacerbation  AKI: baseline Cr 1.22 in July, went up to 1.97 in Sept, now arrived with Cr 2.47 with profound weakness. Soft IV hydration.   Anemia: per hospitalist service. Previous hemoglobin was around 12 in 2021 and Apr 2022, and 11.1 in Sept 2022, now arrived with hgb of 8.0, down to 7.7 this morning. Likely also contributing to her weakness.   Failure to thrive: malnutrition. nutrition consult  Hypothyroidism  For questions or updates, please contact [facility] consult [email or web address] for contact info under        Signed, HaoAlmyra DeforestA Jagual0/20/2022, 1:18 PM    Attending Note:   The patient was seen and examined.  Agree with assessment and plan as noted above.  Changes made to the above note as needed.  Patient seen and independently examined with HaoAlmyra DeforestA .   We discussed all aspects of the encounter. I agree with the assessment and plan as stated above.   Generalized weakness, failure to thrive: The patient presented with worsening shortness of breath, generalized fatigue and weakness.  It looks like she was very volume depleted.  She has  improved with some IV fluids.  Her renal function has improved slightly.  She also was found to be fairly anemic.  We will continue supportive care.  We have held her diuretics and other medications that would impair her renal function.  2.  Chronic diastolic congestive heart failure: She is not volume overloaded at this point.  In fact as noted above she is volume depleted.  2.  COPD: She has severe COPD.  She is not actively wheezing.  She has pulmonary hypertension.  4.  Failure to thrive: Nutrition consult has seen her today.  Continue other medications.   I have spent a total of 40 minutes with patient reviewing hospital  notes , telemetry, EKGs, labs and examining patient as well as establishing an assessment and plan that was discussed with the patient.  > 50% of time was spent in direct patient care.    Thayer Headings, Brooke Bonito., MD, [facility] 07/16/2021, 2:36 PM [address]  [place] Pager [phone]

## 2021-07-17 DIAGNOSIS — M79604 Pain in right leg: Secondary | ICD-10-CM | POA: Diagnosis not present

## 2021-07-17 DIAGNOSIS — D62 Acute posthemorrhagic anemia: Secondary | ICD-10-CM | POA: Diagnosis not present

## 2021-07-17 DIAGNOSIS — Z681 Body mass index (BMI) 19 or less, adult: Secondary | ICD-10-CM | POA: Diagnosis not present

## 2021-07-17 DIAGNOSIS — I5032 Chronic diastolic (congestive) heart failure: Secondary | ICD-10-CM | POA: Diagnosis not present

## 2021-07-17 DIAGNOSIS — E43 Unspecified severe protein-calorie malnutrition: Secondary | ICD-10-CM | POA: Diagnosis not present

## 2021-07-17 DIAGNOSIS — D649 Anemia, unspecified: Secondary | ICD-10-CM | POA: Diagnosis present

## 2021-07-17 DIAGNOSIS — I5033 Acute on chronic diastolic (congestive) heart failure: Secondary | ICD-10-CM | POA: Diagnosis not present

## 2021-07-17 DIAGNOSIS — J9601 Acute respiratory failure with hypoxia: Secondary | ICD-10-CM | POA: Diagnosis not present

## 2021-07-17 DIAGNOSIS — I50812 Chronic right heart failure: Secondary | ICD-10-CM | POA: Diagnosis not present

## 2021-07-17 DIAGNOSIS — I2781 Cor pulmonale (chronic): Secondary | ICD-10-CM | POA: Diagnosis not present

## 2021-07-17 DIAGNOSIS — R222 Localized swelling, mass and lump, trunk: Secondary | ICD-10-CM | POA: Diagnosis not present

## 2021-07-17 DIAGNOSIS — R5381 Other malaise: Secondary | ICD-10-CM | POA: Diagnosis not present

## 2021-07-17 DIAGNOSIS — D500 Iron deficiency anemia secondary to blood loss (chronic): Secondary | ICD-10-CM | POA: Diagnosis not present

## 2021-07-17 DIAGNOSIS — S300XXA Contusion of lower back and pelvis, initial encounter: Secondary | ICD-10-CM | POA: Diagnosis not present

## 2021-07-17 DIAGNOSIS — E1122 Type 2 diabetes mellitus with diabetic chronic kidney disease: Secondary | ICD-10-CM | POA: Diagnosis not present

## 2021-07-17 DIAGNOSIS — M25551 Pain in right hip: Secondary | ICD-10-CM | POA: Diagnosis not present

## 2021-07-17 DIAGNOSIS — W1830XA Fall on same level, unspecified, initial encounter: Secondary | ICD-10-CM | POA: Diagnosis not present

## 2021-07-17 DIAGNOSIS — M7989 Other specified soft tissue disorders: Secondary | ICD-10-CM | POA: Diagnosis not present

## 2021-07-17 DIAGNOSIS — R0600 Dyspnea, unspecified: Secondary | ICD-10-CM | POA: Diagnosis not present

## 2021-07-17 DIAGNOSIS — D631 Anemia in chronic kidney disease: Secondary | ICD-10-CM | POA: Diagnosis not present

## 2021-07-17 DIAGNOSIS — N179 Acute kidney failure, unspecified: Secondary | ICD-10-CM | POA: Diagnosis not present

## 2021-07-17 DIAGNOSIS — I10 Essential (primary) hypertension: Secondary | ICD-10-CM | POA: Diagnosis not present

## 2021-07-17 DIAGNOSIS — E875 Hyperkalemia: Secondary | ICD-10-CM | POA: Diagnosis not present

## 2021-07-17 DIAGNOSIS — R7303 Prediabetes: Secondary | ICD-10-CM | POA: Diagnosis not present

## 2021-07-17 DIAGNOSIS — Z20822 Contact with and (suspected) exposure to covid-19: Secondary | ICD-10-CM | POA: Diagnosis not present

## 2021-07-17 DIAGNOSIS — J42 Unspecified chronic bronchitis: Secondary | ICD-10-CM | POA: Diagnosis not present

## 2021-07-17 DIAGNOSIS — J410 Simple chronic bronchitis: Secondary | ICD-10-CM | POA: Diagnosis not present

## 2021-07-17 DIAGNOSIS — R627 Adult failure to thrive: Secondary | ICD-10-CM | POA: Diagnosis not present

## 2021-07-17 DIAGNOSIS — E785 Hyperlipidemia, unspecified: Secondary | ICD-10-CM | POA: Diagnosis present

## 2021-07-17 DIAGNOSIS — I130 Hypertensive heart and chronic kidney disease with heart failure and stage 1 through stage 4 chronic kidney disease, or unspecified chronic kidney disease: Secondary | ICD-10-CM | POA: Diagnosis not present

## 2021-07-17 DIAGNOSIS — N184 Chronic kidney disease, stage 4 (severe): Secondary | ICD-10-CM | POA: Diagnosis present

## 2021-07-17 DIAGNOSIS — E869 Volume depletion, unspecified: Secondary | ICD-10-CM | POA: Diagnosis not present

## 2021-07-17 DIAGNOSIS — J432 Centrilobular emphysema: Secondary | ICD-10-CM | POA: Diagnosis not present

## 2021-07-17 DIAGNOSIS — R54 Age-related physical debility: Secondary | ICD-10-CM | POA: Diagnosis not present

## 2021-07-17 DIAGNOSIS — E039 Hypothyroidism, unspecified: Secondary | ICD-10-CM | POA: Diagnosis present

## 2021-07-17 DIAGNOSIS — M1611 Unilateral primary osteoarthritis, right hip: Secondary | ICD-10-CM | POA: Diagnosis not present

## 2021-07-17 DIAGNOSIS — D509 Iron deficiency anemia, unspecified: Secondary | ICD-10-CM | POA: Diagnosis not present

## 2021-07-17 DIAGNOSIS — J9811 Atelectasis: Secondary | ICD-10-CM | POA: Diagnosis not present

## 2021-07-17 DIAGNOSIS — J449 Chronic obstructive pulmonary disease, unspecified: Secondary | ICD-10-CM | POA: Diagnosis present

## 2021-07-17 DIAGNOSIS — I2723 Pulmonary hypertension due to lung diseases and hypoxia: Secondary | ICD-10-CM | POA: Diagnosis not present

## 2021-07-17 DIAGNOSIS — Y92230 Patient room in hospital as the place of occurrence of the external cause: Secondary | ICD-10-CM | POA: Diagnosis not present

## 2021-07-17 DIAGNOSIS — E038 Other specified hypothyroidism: Secondary | ICD-10-CM | POA: Diagnosis not present

## 2021-07-17 DIAGNOSIS — J4520 Mild intermittent asthma, uncomplicated: Secondary | ICD-10-CM | POA: Diagnosis present

## 2021-07-17 LAB — CBC
HCT: 24.3 % — ABNORMAL LOW (ref 36.0–46.0)
Hemoglobin: 7.7 g/dL — ABNORMAL LOW (ref 12.0–15.0)
MCH: 26.6 pg (ref 26.0–34.0)
MCHC: 31.7 g/dL (ref 30.0–36.0)
MCV: 83.8 fL (ref 80.0–100.0)
Platelets: 287 10*3/uL (ref 150–400)
RBC: 2.9 MIL/uL — ABNORMAL LOW (ref 3.87–5.11)
RDW: 24.7 % — ABNORMAL HIGH (ref 11.5–15.5)
WBC: 4.6 10*3/uL (ref 4.0–10.5)
nRBC: 0 % (ref 0.0–0.2)

## 2021-07-17 LAB — ABO/RH: ABO/RH(D): A POS

## 2021-07-17 LAB — BASIC METABOLIC PANEL
Anion gap: 6 (ref 5–15)
BUN: 77 mg/dL — ABNORMAL HIGH (ref 8–23)
CO2: 24 mmol/L (ref 22–32)
Calcium: 8.4 mg/dL — ABNORMAL LOW (ref 8.9–10.3)
Chloride: 111 mmol/L (ref 98–111)
Creatinine, Ser: 2.54 mg/dL — ABNORMAL HIGH (ref 0.44–1.00)
GFR, Estimated: 19 mL/min — ABNORMAL LOW (ref >60)
Glucose, Bld: 103 mg/dL — ABNORMAL HIGH (ref 70–99)
Potassium: 4 mmol/L (ref 3.5–5.1)
Sodium: 141 mmol/L (ref 135–145)

## 2021-07-17 LAB — HEMOGLOBIN AND HEMATOCRIT, BLOOD
HCT: 29.7 % — ABNORMAL LOW (ref 36.0–46.0)
Hemoglobin: 9.4 g/dL — ABNORMAL LOW (ref 12.0–15.0)

## 2021-07-17 LAB — PREPARE RBC (CROSSMATCH)

## 2021-07-17 MED ORDER — POLYETHYLENE GLYCOL 3350 17 G PO PACK
17.0000 g | PACK | Freq: Once | ORAL | Status: AC
  Administered 2021-07-17: 17 g via ORAL
  Filled 2021-07-17: qty 1

## 2021-07-17 MED ORDER — SODIUM CHLORIDE 0.9 % IV SOLN
250.0000 mg | Freq: Every day | INTRAVENOUS | Status: AC
  Administered 2021-07-17 – 2021-07-20 (×4): 250 mg via INTRAVENOUS
  Filled 2021-07-17 (×5): qty 20

## 2021-07-17 MED ORDER — SODIUM CHLORIDE 0.9% IV SOLUTION: Freq: Once | INTRAVENOUS | Status: AC

## 2021-07-17 NOTE — Progress Notes (Signed)
Progress Note  Patient Name: Leslie Reeves Date of Encounter: 07/17/2021  [facility] Cardiologist: [name], MD   Subjective   80 year old female with a history of COPD, CVA, chronic diastolic CHF, hypothyroidism admitted as a direct admission from our office with profound weakness and shortness of breath.  She was found not to have congestive heart failure but was volume depleted and had acute worsening of her renal function.   Feels a little better this morning. Denies any SOB or CP.   She remains anemic.    Creatinine remains elevated at 2.5   I have talked with Dr. Tana Coast. She will take over as the attending. We will remain on as consultants We will need to help decide when to restart diuretics and help guide dose of diuretics.    Inpatient Medications    Scheduled Meds:  amLODipine  10 mg Oral Daily   feeding supplement  237 mL Oral BID BM   heparin  5,000 Units Subcutaneous Q8H   isosorbide-hydrALAZINE  1 tablet Oral TID   levothyroxine  37.5 mcg Oral Q0600   megestrol  600 mg Oral Daily   mirtazapine  15 mg Oral QHS   mometasone-formoterol  2 puff Inhalation BID   multivitamin with minerals  1 tablet Oral Daily   rosuvastatin  10 mg Oral Daily   Continuous Infusions:  PRN Meds: acetaminophen, albuterol, nitroGLYCERIN, ondansetron (ZOFRAN) IV   Vital Signs    Vitals:   07/17/21 0400 07/17/21 0411 07/17/21 0806 07/17/21 0856  BP: 127/62   (!) 144/57  Pulse: 66   69  Resp: 18     Temp: 98.8 F (37.1 C)   99 F (37.2 C)  TempSrc: Oral   Oral  SpO2: 92%  92% 90%  Weight:  44.3 kg    Height:        Intake/Output Summary (Last 24 hours) at 07/17/2021 0902 Last data filed at 07/17/2021 0500 Gross per 24 hour  Intake 769.94 ml  Output 500 ml  Net 269.94 ml    Last 3 Weights 07/17/2021 07/16/2021 07/16/2021  Weight (lbs) 97 lb 10.6 oz 90 lb 6.4 oz 90 lb 6.4 oz  Weight (kg) 44.3 kg 41.005 kg 41.005 kg      Telemetry    NSR without  significant ventricular ectopy - Personally Reviewed  ECG    NSR without significant ST-T wave changes - Personally Reviewed  Physical Exam   Physical Exam: Blood pressure (!) 144/57, pulse 69, temperature 99 F (37.2 C), temperature source Oral, resp. rate 18, height _0  (1.549 m), weight 44.3 kg, SpO2 90 %.  GEN:  frail, elderly female,   actually appears stronger than previous days  HEENT: Normal NECK: No JVD; No carotid bruits LYMPHATICS: No lymphadenopathy CARDIAC: RRR   RESPIRATORY:  Clear to auscultation without rales, wheezing or rhonchi  ABDOMEN: Soft, non-tender, non-distended MUSCULOSKELETAL:  thin, No edema; No deformity  SKIN: Warm and dry NEUROLOGIC:  Alert and oriented x 3   Labs    High Sensitivity Troponin:  No results for input(s): TROPONINIHS in the last 720 hours.   Chemistry Recent Labs  Lab 07/15/21 2012 07/16/21 0345 07/17/21 0324  NA 141 144 141  K 3.7 3.6 4.0  CL 107 111 111  CO2 _1 GLUCOSE 96 81 103*  BUN 85* 82* 77*  CREATININE 2.47* 2.33* 2.54*  CALCIUM 8.8* 8.8* 8.4*  PROT 5.8*  --   --   ALBUMIN 2.9*  --   --  AST 23  --   --   ALT 29  --   --   ALKPHOS 38  --   --   BILITOT 0.4  --   --   GFRNONAA 19* 21* 19*  ANIONGAP _0 Lipids No results for input(s): CHOL, TRIG, HDL, LABVLDL, LDLCALC, CHOLHDL in the last 168 hours.  Hematology Recent Labs  Lab 07/15/21 2012 07/16/21 0345 07/17/21 0324  WBC 5.0 4.4 4.6  RBC 3.02* 2.92* 2.90*  HGB 8.0* 7.7* 7.7*  HCT 25.5* 24.7* 24.3*  MCV 84.4 84.6 83.8  MCH 26.5 26.4 26.6  MCHC 31.4 31.2 31.7  RDW 24.9* 24.6* 24.7*  PLT 272 274 287    Thyroid  Recent Labs  Lab 07/15/21 2012  TSH 1.690     BNP Recent Labs  Lab 07/13/21 1040  BNP 50.9     DDimer No results for input(s): DDIMER in the last 168 hours.   Radiology    DG CHEST PORT 1 VIEW  Result Date: 07/15/2021 CLINICAL DATA:  Shortness of breath EXAM: PORTABLE CHEST 1 VIEW COMPARISON:  06/11/2021  FINDINGS: There is hyperinflation of the lungs compatible with COPD. Heart is upper limits normal in size. Aortic atherosclerosis. No confluent airspace opacities or effusions. No acute bony abnormality. IMPRESSION: COPD.  No active disease. Electronically Signed   By: Rolm Baptise M.D.   On: 07/15/2021 20:06   ECHOCARDIOGRAM LIMITED  Result Date: 07/16/2021    ECHOCARDIOGRAM LIMITED REPORT   Patient Name:   Leslie Reeves Date of Exam: 07/16/2021 Medical Rec #:  [id number]      Height:       61.0 in Accession #:    [id number]     Weight:       90.4 lb Date of Birth:  [birth year]       BSA:          1.348 m Patient Age:    28 years       BP:           147/57 mmHg Patient Gender: F              HR:           68 bpm. Exam Location:  Inpatient Procedure: Limited Echo, Limited Color Doppler and Cardiac Doppler Indications:    dyspnea  History:        Patient has prior history of Echocardiogram examinations, most                 recent 06/12/2021. COPD; Risk Factors:Diabetes and Hypertension.  Sonographer:    Johny Chess RDCS Referring Phys: [id number] [place]  1. Left ventricular ejection fraction, by estimation, is 65 to 70%. The left ventricle has no regional wall motion abnormalities. There is mild to moderate left ventricular hypertrophy. Left ventricular diastolic parameters are consistent with Grade I diastolic dysfunction (impaired relaxation).  2. Right ventricular systolic function is normal. The right ventricular size is normal. There is moderately elevated pulmonary artery systolic pressure.  3. The mitral valve is normal in structure. Trivial mitral valve regurgitation. No evidence of mitral stenosis.  4. The aortic valve is normal in structure. Aortic valve regurgitation is not visualized. No aortic stenosis is present.  5. The inferior vena cava is normal in size with greater than 50% respiratory variability, suggesting right atrial pressure of 3 mmHg. FINDINGS  Left Ventricle:  Left ventricular ejection fraction, by estimation, is 65 to 70%. The  left ventricle has no regional wall motion abnormalities. The left ventricular internal cavity size was normal in size. There is mild left ventricular hypertrophy. Left  ventricular diastolic parameters are consistent with Grade I diastolic dysfunction (impaired relaxation). Right Ventricle: The right ventricular size is normal. No increase in right ventricular wall thickness. Right ventricular systolic function is normal. There is moderately elevated pulmonary artery systolic pressure. The tricuspid regurgitant velocity is 3.56 m/s, and with an assumed right atrial pressure of 3 mmHg, the estimated right ventricular systolic pressure is 05.3 mmHg. Left Atrium: Left atrial size was normal in size. Right Atrium: Right atrial size was normal in size. Pericardium: There is no evidence of pericardial effusion. Mitral Valve: The mitral valve is normal in structure. Trivial mitral valve regurgitation. No evidence of mitral valve stenosis. Tricuspid Valve: The tricuspid valve is normal in structure. Tricuspid valve regurgitation is trivial. No evidence of tricuspid stenosis. Aortic Valve: The aortic valve is normal in structure. Aortic valve regurgitation is not visualized. No aortic stenosis is present. Pulmonic Valve: The pulmonic valve was normal in structure. Pulmonic valve regurgitation is trivial. No evidence of pulmonic stenosis. Aorta: The aortic root is normal in size and structure. Venous: The inferior vena cava is normal in size with greater than 50% respiratory variability, suggesting right atrial pressure of 3 mmHg. IAS/Shunts: No atrial level shunt detected by color flow Doppler. LEFT VENTRICLE PLAX 2D LVIDd:         4.10 cm   Diastology LVIDs:         2.40 cm   LV e' medial:    8.59 cm/s LV PW:         0.80 cm   LV E/e' medial:  12.0 LV IVS:        0.90 cm   LV e' lateral:   11.90 cm/s LVOT diam:     1.70 cm   LV E/e' lateral: 8.7 LV SV:          67 LV SV Index:   49 LVOT Area:     2.27 cm  IVC IVC diam: 1.20 cm LEFT ATRIUM         Index LA diam:    3.00 cm 2.23 cm/m  AORTIC VALVE LVOT Vmax:   137.00 cm/s LVOT Vmean:  82.900 cm/s LVOT VTI:    0.293 m  AORTA Ao Asc diam: 3.20 cm MITRAL VALVE                TRICUSPID VALVE MV Area (PHT): 3.37 cm     TR Peak grad:   50.7 mmHg MV Decel Time: 225 msec     TR Vmax:        356.00 cm/s MV E velocity: 103.00 cm/s MV A velocity: 114.00 cm/s  SHUNTS MV E/A ratio:  0.90         Systemic VTI:  0.29 m                             Systemic Diam: 1.70 cm Glori Bickers MD Electronically signed by Glori Bickers MD Signature Date/Time: 07/16/2021/4:28:36 PM    Final     Cardiac Studies   Echo 06/12/2021 IMPRESSIONS     1. Left ventricular ejection fraction, by estimation, is 60 to 65%. The  left ventricle has normal function. The left ventricle has no regional  wall motion abnormalities. Left ventricular diastolic parameters are  consistent with Grade I diastolic  dysfunction (  impaired relaxation).   2. Right ventricular systolic function is normal. The right ventricular  size is normal. There is moderately elevated pulmonary artery systolic  pressure.   3. The mitral valve is normal in structure. Mild mitral valve  regurgitation. No evidence of mitral stenosis.   4. Tricuspid valve regurgitation is mild to moderate.   5. The aortic valve is tricuspid. Aortic valve regurgitation is not  visualized. No aortic stenosis is present.   6. The inferior vena cava is normal in size with greater than 50%  respiratory variability, suggesting right atrial pressure of 3 mmHg.   Comparison(s): No prior Echocardiogram.   Patient Profile     80 y.o. female with PMH of severe COPD, chronic diastolic heart failure, pulmonary hypertension presented with failure to thrive and worsening dyspnea. Patient was previously admitted in Sept with volume overload and was discharged on lasix. She reported no  significant urine output with lasix in the past week. She was seen in the office and directly admitted to the hospital. On arrival, she actually weighs 4 kg lighter than her discharge weight in September. Cr 2.47 which is higher than her previous baseline. She also has quite significant anemia with hgb 8.0, internal medicine service consulted.   Assessment & Plan    Profound weakness   - Echo 06/12/2021 EF 60-65%, grade 1 DD, moderately elevated PASP, mild MR, mild to moderate TR  - admitted with CHF in Sept and was discharged on lasix 9m every other day.  - admission weight is 4 kg lower than previous dry weight in September - lasix was held, soft IV hydration, given 750mhr of IVF,   She will need some diuresis ( or perhaps needs to limit her salt intake if she was previously eating lots of salty foods.  Lasix remains on hold  Will need to decide on future diuretic dosing as she improves   COPD:  stable   AKI: baseline Cr 1.22 in July, went up to 1.97 in Sept, now arrived with Cr 2.47 with profound weakness. Soft IV hydration.   Anemia: per hospitalist service.  Appears to have anemia of chronic disease  Further plans per Dr. RaTana Coast Failure to thrive: malnutrition. nutrition consult  Hypothyroidism  For questions or updates, please contact [facility] consult [email or web address] for contact info under     PhRamond Dial MD, FAIntegris Community Hospital - Council Crossing0/21/2022, 9:02 AM [id number] [place] [phone]

## 2021-07-17 NOTE — Progress Notes (Addendum)
[facility] note                                                                              Patient Demographics  Leslie Reeves, is a 80 y.o. female, DOB - 1941/08/05, TGG:[id number]  Admit date - 07/15/2021   Admitting Physician Jettie Booze, MD  Outpatient Primary MD for the patient is [name], [place]  Outpatient specialists:   LOS - 1  days   Medical records reviewed and are as summarized below:    No chief complaint on file.      Brief summary   Patient is a 80 year old female with COPD, CVA, chronic diastolic CHF, prediabetes, CKD , hypothyroidism presented as a direct admission from cardiology office for shortness of breath suspected to be CHF exacerbation.  Patient had reported symptoms of 1 week of increasing shortness of breath, worse with exertion and better at rest.  Normally sleeps on a recliner so was unsure if she had shortness of breath if laying flat.  Also reported lower extremity edema about a week ago but improved when she elevated her feet at night.  Reported compliance with Lasix, no wheezing, coughing or fevers.  Not on home O2, denies any current tobacco use.  Reported good appetite but has lost at least 26 pounds over several months.   Patient was recently hospitalized in September for acute diastolic CHF and was started on low-dose Lasix 20 mg every other day. Initially admitted to cardiology service, [facility] assumed care on 10/21  Assessment & Plan   Dyspnea with exertion, acute on chronic anemia, Symptomatic anemia -Initially thought to be from acute diastolic CHF however appeared to be somewhat volume depleted.  Also has history of COPD, acute kidney injury on CKD stage IIIb/IV, acute on chronic normocytic anemia, likely symptoms from symptomatic anemia -Lasix held, renal function improving -Baseline hemoglobin  ~11, on admission  -Anemia panel showed normal B12, folate, ferritin 17, %sat 11, Fe 32, TIBC 288,  -Follow FOBT,  repeat hemoglobin 7.7, will transfuse 1 unit packed RBCs, IV ferric gluconate -Given renal insufficiency, anemia, UA with proteinuria, will also order SPEP, UPEP, immunofixation for further work-up -If FOBT positive, will discuss with GI.  No obvious GI bleeding, per patient has brown stools but no dark melanotic stools or blood noted   Active Problems: Chronic diastolic CHF -Currently stable, not in any volume overload -Lasix was held, creatinine trended up to 2.5, received gentle IV fluid hydration -2D echo showed EF of 65 to 70%, G1 DD  -Cardiology following  COPD -Patient has underlying severe COPD, currently no active wheezing, does not use O2 at home -Continue Dulera, albuterol nebs as needed  Failure to thrive -PT OT eval pending -Continue Megace, Remeron -Patient reports she lives with her family, otherwise is functional with her ADLs, alert and oriented x3  Hypothyroidism -Continue Synthroid, TSH 1.6  Acute on CKD stage IV -Baseline creatinine 1.7-1.9, creatinine was 2.28 in 05/2021, likely worsened due to diuresis and anemia -Presented with creatinine of 2.4, Lasix held, creatinine still trending up to 2.5 -Continue to hold Lasix, gentle hydration, 1 unit packed RBC transfusion  moderate to severe protein calorie malnutrition, underweight Estimated body mass index is 18.45 kg/m as calculated from the following:   Height as of this encounter: _0  (1.549 m).   Weight as of this encounter: 44.3 kg. -Nutrition consult  Code Status: Full CODE STATUS DVT Prophylaxis:  heparin injection 5,000 Units Start: 07/15/21 2200   Level of Care: Level of care: Telemetry Cardiac Family Communication: Discussed all imaging results, lab results, explained to the patient's daughter on phone     Disposition Plan:     Status is: Inpatient,  Time Spent in minutes 35 minutes  Procedures:  2D echo   Antimicrobials:   Anti-infectives (From admission, onward)    None           Medications  Scheduled Meds:  sodium chloride   Intravenous Once   amLODipine  10 mg Oral Daily   feeding supplement  237 mL Oral BID BM   heparin  5,000 Units Subcutaneous Q8H   isosorbide-hydrALAZINE  1 tablet Oral TID   levothyroxine  37.5 mcg Oral Q0600   megestrol  600 mg Oral Daily   mirtazapine  15 mg Oral QHS   mometasone-formoterol  2 puff Inhalation BID   multivitamin with minerals  1 tablet Oral Daily   rosuvastatin  10 mg Oral Daily   Continuous Infusions:  ferric gluconate (FERRLECIT) IVPB 250 mg (07/17/21 1311)   PRN Meds:.acetaminophen, albuterol, nitroGLYCERIN, ondansetron (ZOFRAN) IV      Subjective:   Leslie Reeves was seen and examined today.  No significant complaints per patient.  No acute chest pain, shortness of breath or wheezing.  No bleeding noted  Objective:   Vitals:   07/17/21 0806 07/17/21 0856 07/17/21 1210 07/17/21 1406  BP:  (!) 144/57 (!) 148/54 (!) 143/52  Pulse:  69 74 67  Resp:      Temp:  99 F (37.2 C)  99.4 F (37.4 C)  TempSrc:  Oral  Oral  SpO2: 92% 90% 96% 93%  Weight:      Height:        Intake/Output Summary (Last 24 hours) at 07/17/2021 1426 Last data filed at 07/17/2021 1208 Gross per 24 hour  Intake 549.94 ml  Output 850 ml  Net -300.06 ml     Wt Readings from Last 3 Encounters:  07/17/21 44.3 kg  07/15/21 41.4 kg  07/13/21 41.1 kg    Physical Exam General: Alert and oriented x 3, NAD, frail Cardiovascular: S1 S2 clear, RRR. No pedal edema b/l Respiratory: CTAB, no wheezing, rales or rhonchi Gastrointestinal: Soft, nontender, nondistended, NBS Ext: no pedal edema bilaterally    Data Reviewed:  I have personally reviewed following labs and imaging studies  Micro Results Recent Results (from the past 240 hour(s))  Resp Panel by RT-PCR (Flu A&B, Covid) Nasopharyngeal Swab     Status: None   Collection Time: 07/15/21  6:23 PM   Specimen: Nasopharyngeal Swab; Nasopharyngeal(NP) swabs in  vial transport medium  Result Value Ref Range Status   SARS Coronavirus 2 by RT PCR NEGATIVE NEGATIVE Final    Comment: (NOTE) SARS-CoV-2 target nucleic acids are NOT DETECTED.  The SARS-CoV-2 RNA is generally detectable in upper respiratory specimens during the acute phase of infection. The lowest concentration of SARS-CoV-2 viral copies this assay can detect is 138 copies/mL. A negative result does not preclude SARS-Cov-2 infection and should not be used as the sole basis for treatment or other patient management decisions. A negative result may occur with  improper specimen collection/handling, submission of specimen other than nasopharyngeal swab, presence of viral mutation(s) within the areas targeted by this assay, and inadequate number of viral copies(<138 copies/mL). A negative result must be combined with clinical observations, patient history, and epidemiological information. The expected result is Negative.  Fact Sheet for Patients:  EntrepreneurPulse.com.au  Fact Sheet for Healthcare Providers:  IncredibleEmployment.be  This test is no t yet approved or cleared by the [place] FDA and  has been authorized for detection and/or diagnosis of SARS-CoV-2 by FDA under an Emergency Use Authorization (EUA). This EUA will remain  in effect (meaning this test can be used) for the duration of the COVID-19 declaration under Section 564(b)(1) of the Act, 21 U.S.C.section 360bbb-3(b)(1), unless the authorization is terminated  or revoked sooner.       Influenza A by PCR NEGATIVE NEGATIVE Final   Influenza B by PCR NEGATIVE NEGATIVE Final    Comment: (NOTE) The Xpert Xpress SARS-CoV-2/FLU/RSV plus assay is intended as an aid in the diagnosis of influenza from Nasopharyngeal swab specimens and should not be used as a sole basis for treatment. Nasal washings and aspirates are unacceptable for Xpert Xpress SARS-CoV-2/FLU/RSV testing.  Fact  Sheet for Patients: EntrepreneurPulse.com.au  Fact Sheet for Healthcare Providers: IncredibleEmployment.be  This test is not yet approved or cleared by the [place] FDA and has been authorized for detection and/or diagnosis of SARS-CoV-2 by FDA under an Emergency Use Authorization (EUA). This EUA will remain in effect (meaning this test can be used) for the duration of the COVID-19 declaration under Section 564(b)(1) of the Act, 21 U.S.C. section 360bbb-3(b)(1), unless the authorization is terminated or revoked.  Performed at [facility], [place] [address], [place],  [address]     Radiology Reports DG Bone Density  Result Date: 07/07/2021 EXAM: DUAL X-RAY ABSORPTIOMETRY (DXA) FOR BONE MINERAL DENSITY IMPRESSION: Referring Physician:  [name] Your patient completed a bone mineral density test using GE Lunar iDXA system (analysis version: 16). Technologist: [place] PATIENT: Name: Pina, Sirianni Patient ID:  [id number] Birth Date: 12-04-1940 Height:     61.0 in. Sex:         Female Measured:   07/06/2021 Weight:     98.1 lbs. Indications: Advanced Age, Bilateral Ovariectomy (65.51), Estrogen Deficient, Hysterectomy, Postmenopausal Fractures: None Treatments: None ASSESSMENT: The BMD measured at Forearm Radius 33% is 0.590 g/cm2 with a T-score of -3.4. This patient is considered OSTEOPOROTIC according to [facility]) criteria. The quality of the exam is good. The lumbar spine was excluded due to degenerative changes. Site Region Measured Date Measured Age YA BMD Significant CHANGE T-score Left Forearm Radius 33% 07/06/2021 80.4 -3.4 0.590 g/cm2 DualFemur Neck Right 07/06/2021 80.4 -2.3 0.722 g/cm2 * DualFemur Neck Right 07/03/2018 77.4 -1.4 0.845 g/cm2 DualFemur Total Mean 07/06/2021 80.4 -1.5 0.823 g/cm2 * DualFemur Total Mean 07/03/2018 77.4 -0.8 0.906 g/cm2 World Health Organization [facility]) criteria for post-menopausal,  Caucasian Women: Normal       T-score at or above -1 SD Osteopenia   T-score between -1 and -2.5 SD Osteoporosis T-score at or below -2.5 SD RECOMMENDATION: 1. All patients should optimize calcium and vitamin D intake. 2. Consider FDA-approved medical therapies in postmenopausal women and men aged 58 years and older, based on the following: a. A hip or vertebral (clinical or morphometric) fracture. b. T-score = -2.5 at the femoral neck or spine after appropriate evaluation to exclude secondary causes. c. Low bone mass (T-score between -1.0 and -  2.5 at the femoral neck or spine) and a 10-year probability of a hip fracture = 3% or a 10-year probability of a major osteoporosis-related fracture = 20% based on the [place]-adapted WHO algorithm. d. Clinician judgment and/or patient preferences may indicate treatment for people with 10-year fracture probabilities above or below these levels. FOLLOW-UP: Patients with diagnosis of osteoporosis or at high risk for fracture should have regular bone mineral density tests.? Patients eligible for Medicare are allowed routine testing every 2 years.? The testing frequency can be increased to one year for patients who have rapidly progressing disease, are receiving or discontinuing medical therapy to restore bone mass, or have additional risk factors. I have reviewed this study and agree with the findings. Mark A. Thornton Papas, M.D. [facility] Electronically Signed   By: Lavonia Dana M.D.   On: 07/07/2021 08:23   DG CHEST PORT 1 VIEW  Result Date: 07/15/2021 CLINICAL DATA:  Shortness of breath EXAM: PORTABLE CHEST 1 VIEW COMPARISON:  06/11/2021 FINDINGS: There is hyperinflation of the lungs compatible with COPD. Heart is upper limits normal in size. Aortic atherosclerosis. No confluent airspace opacities or effusions. No acute bony abnormality. IMPRESSION: COPD.  No active disease. Electronically Signed   By: Rolm Baptise M.D.   On: 07/15/2021 20:06   ECHOCARDIOGRAM  LIMITED  Result Date: 07/16/2021    ECHOCARDIOGRAM LIMITED REPORT   Patient Name:   EFRAT ZUIDEMA Date of Exam: 07/16/2021 Medical Rec #:  [id number]      Height:       61.0 in Accession #:    [id number]     Weight:       90.4 lb Date of Birth:  Jul 09, 1941       BSA:          1.348 m Patient Age:    33 years       BP:           147/57 mmHg Patient Gender: F              HR:           68 bpm. Exam Location:  Inpatient Procedure: Limited Echo, Limited Color Doppler and Cardiac Doppler Indications:    dyspnea  History:        Patient has prior history of Echocardiogram examinations, most                 recent 06/12/2021. COPD; Risk Factors:Diabetes and Hypertension.  Sonographer:    Johny Chess RDCS Referring Phys: [id number] [place]  1. Left ventricular ejection fraction, by estimation, is 65 to 70%. The left ventricle has no regional wall motion abnormalities. There is mild to moderate left ventricular hypertrophy. Left ventricular diastolic parameters are consistent with Grade I diastolic dysfunction (impaired relaxation).  2. Right ventricular systolic function is normal. The right ventricular size is normal. There is moderately elevated pulmonary artery systolic pressure.  3. The mitral valve is normal in structure. Trivial mitral valve regurgitation. No evidence of mitral stenosis.  4. The aortic valve is normal in structure. Aortic valve regurgitation is not visualized. No aortic stenosis is present.  5. The inferior vena cava is normal in size with greater than 50% respiratory variability, suggesting right atrial pressure of 3 mmHg. FINDINGS  Left Ventricle: Left ventricular ejection fraction, by estimation, is 65 to 70%. The left ventricle has no regional wall motion abnormalities. The left ventricular internal cavity size was normal in size. There is mild left ventricular hypertrophy.  Left  ventricular diastolic parameters are consistent with Grade I diastolic dysfunction (impaired  relaxation). Right Ventricle: The right ventricular size is normal. No increase in right ventricular wall thickness. Right ventricular systolic function is normal. There is moderately elevated pulmonary artery systolic pressure. The tricuspid regurgitant velocity is 3.56 m/s, and with an assumed right atrial pressure of 3 mmHg, the estimated right ventricular systolic pressure is 84.1 mmHg. Left Atrium: Left atrial size was normal in size. Right Atrium: Right atrial size was normal in size. Pericardium: There is no evidence of pericardial effusion. Mitral Valve: The mitral valve is normal in structure. Trivial mitral valve regurgitation. No evidence of mitral valve stenosis. Tricuspid Valve: The tricuspid valve is normal in structure. Tricuspid valve regurgitation is trivial. No evidence of tricuspid stenosis. Aortic Valve: The aortic valve is normal in structure. Aortic valve regurgitation is not visualized. No aortic stenosis is present. Pulmonic Valve: The pulmonic valve was normal in structure. Pulmonic valve regurgitation is trivial. No evidence of pulmonic stenosis. Aorta: The aortic root is normal in size and structure. Venous: The inferior vena cava is normal in size with greater than 50% respiratory variability, suggesting right atrial pressure of 3 mmHg. IAS/Shunts: No atrial level shunt detected by color flow Doppler. LEFT VENTRICLE PLAX 2D LVIDd:         4.10 cm   Diastology LVIDs:         2.40 cm   LV e' medial:    8.59 cm/s LV PW:         0.80 cm   LV E/e' medial:  12.0 LV IVS:        0.90 cm   LV e' lateral:   11.90 cm/s LVOT diam:     1.70 cm   LV E/e' lateral: 8.7 LV SV:         67 LV SV Index:   49 LVOT Area:     2.27 cm  IVC IVC diam: 1.20 cm LEFT ATRIUM         Index LA diam:    3.00 cm 2.23 cm/m  AORTIC VALVE LVOT Vmax:   137.00 cm/s LVOT Vmean:  82.900 cm/s LVOT VTI:    0.293 m  AORTA Ao Asc diam: 3.20 cm MITRAL VALVE                TRICUSPID VALVE MV Area (PHT): 3.37 cm     TR Peak grad:    50.7 mmHg MV Decel Time: 225 msec     TR Vmax:        356.00 cm/s MV E velocity: 103.00 cm/s MV A velocity: 114.00 cm/s  SHUNTS MV E/A ratio:  0.90         Systemic VTI:  0.29 m                             Systemic Diam: 1.70 cm Glori Bickers MD Electronically signed by Glori Bickers MD Signature Date/Time: 07/16/2021/4:28:36 PM    Final     Lab Data:  CBC: Recent Labs  Lab 07/15/21 2012 07/16/21 0345 07/17/21 0324  WBC 5.0 4.4 4.6  HGB 8.0* 7.7* 7.7*  HCT 25.5* 24.7* 24.3*  MCV 84.4 84.6 83.8  PLT 272 274 660   Basic Metabolic Panel: Recent Labs  Lab 07/15/21 2012 07/16/21 0345 07/17/21 0324  NA 141 144 141  K 3.7 3.6 4.0  CL 107 111 111  CO2 24 24 24  GLUCOSE 96 81 103*  BUN 85* 82* 77*  CREATININE 2.47* 2.33* 2.54*  CALCIUM 8.8* 8.8* 8.4*   GFR: Estimated Creatinine Clearance: 12.4 mL/min (A) (by C-G formula based on SCr of 2.54 mg/dL (H)). Liver Function Tests: Recent Labs  Lab 07/15/21 2012  AST 23  ALT 29  ALKPHOS 38  BILITOT 0.4  PROT 5.8*  ALBUMIN 2.9*   No results for input(s): LIPASE, AMYLASE in the last 168 hours. No results for input(s): AMMONIA in the last 168 hours. Coagulation Profile: No results for input(s): INR, PROTIME in the last 168 hours. Cardiac Enzymes: No results for input(s): CKTOTAL, CKMB, CKMBINDEX, TROPONINI in the last 168 hours. BNP (last 3 results) No results for input(s): PROBNP in the last 8760 hours. HbA1C: No results for input(s): HGBA1C in the last 72 hours. CBG: No results for input(s): GLUCAP in the last 168 hours. Lipid Profile: No results for input(s): CHOL, HDL, LDLCALC, TRIG, CHOLHDL, LDLDIRECT in the last 72 hours. Thyroid Function Tests: Recent Labs    07/15/21 2012  TSH 1.690   Anemia Panel: Recent Labs    07/16/21 0345  VITAMINB12 324  FOLATE 36.0  FERRITIN 17  TIBC 288  IRON 32   Urine analysis:    Component Value Date/Time   COLORURINE YELLOW 06/12/2021 0422   APPEARANCEUR HAZY (A)  06/12/2021 0422   LABSPEC 1.009 06/12/2021 0422   PHURINE 5.0 06/12/2021 0422   GLUCOSEU NEGATIVE 06/12/2021 0422   HGBUR NEGATIVE 06/12/2021 0422   BILIRUBINUR Negative 07/13/2021 1654   KETONESUR NEGATIVE 06/12/2021 0422   PROTEINUR Positive (A) 07/13/2021 1654   PROTEINUR 100 (A) 06/12/2021 0422   UROBILINOGEN 0.2 07/13/2021 1654   NITRITE Negative 07/13/2021 1654   NITRITE NEGATIVE 06/12/2021 0422   LEUKOCYTESUR Negative 07/13/2021 1654   LEUKOCYTESUR NEGATIVE 06/12/2021 0422     Lylia Karn M.D. [facility] 07/17/2021, 2:26 PM  Available via Epic secure chat 7am-7pm After 7 pm, please refer to night coverage provider listed on amion.

## 2021-07-17 NOTE — Evaluation (Signed)
Occupational Therapy Evaluation Patient Details Name: Leslie Reeves MRN: [id number] DOB: 06-Oct-1940 Today's Date: 07/17/2021   History of Present Illness 80 year old female with COPD, CVA, chronic diastolic CHF, prediabetes, CKD , hypothyroidism presented as a direct admission from cardiology office for shortness of breath suspected to be CHF exacerbation.   Clinical Impression   Patient admitted for the diagnosis above.  PTA she lives with her daughter, who is the primary CG.  She provides assist with ADL/IADL, and supervision for mobility.  Deficits impacting independence are listed below. Currently she is needing up to Min A for basic mobility at RW level, including safety cures and assist for RW management; and up to [name] for stand grooming and UB ADL seated.  Given the level of support at home, no [place] OT needs are anticipated.  The daughter is open to [facility] PT for strengthening, balance and mobility.  Per the daughter, the patient has been more fatigued lately, and spending more time in bed.  Patient receiving blood products as HGB 7.7.  Patient had no dizziness with OOB, but did become SOB with mobility from the toilet to the bed.  O2 sats 87% on RA and HR 86.  Patient rebounded to 92% on RA after one minute.       Recommendations for follow up therapy are one component of a multi-disciplinary discharge planning process, led by the attending physician.  Recommendations may be updated based on patient status, additional functional criteria and insurance authorization.   Follow Up Recommendations  No OT follow up    Equipment Recommendations  Tub/shower bench    Recommendations for Other Services       Precautions / Restrictions Precautions Precautions: Fall Precaution Comments: Watch O2 sats Restrictions Weight Bearing Restrictions: No      Mobility Bed Mobility Overal bed mobility: Needs Assistance Bed Mobility: Supine to Sit;Sit to Supine     Supine to sit: Min guard Sit to  supine: Min guard        Transfers Overall transfer level: Needs assistance Equipment used: Rolling walker (2 wheeled) Transfers: Sit to/from [facility] Sit to Stand: Min assist Stand pivot transfers: Min assist            Balance Overall balance assessment: Needs assistance Sitting-balance support: Feet supported Sitting balance-Leahy Scale: Fair     Standing balance support: Bilateral upper extremity supported Standing balance-Leahy Scale: Poor Standing balance comment: needing RW and external assist for RW safety                           ADL either performed or assessed with clinical judgement   ADL       Grooming: Wash/dry hands;Min guard;Standing   Upper Body Bathing: Min guard;Sitting       Upper Body Dressing : Minimal assistance;Sitting       Toilet Transfer: Minimal Teacher, English as a foreign language;Ambulation Toilet Transfer Details (indicate cue type and reason): decreased safety with RW, desat to 87% with toileting task.  RA Toileting- Clothing Manipulation and Hygiene: Min guard;Sitting/lateral lean               Vision Patient Visual Report: No change from baseline       Perception     Praxis      Pertinent Vitals/Pain Pain Assessment: No/denies pain     Hand Dominance Right   Extremity/Trunk Assessment Upper Extremity Assessment Upper Extremity Assessment: Overall WFL for tasks assessed   Lower Extremity  Assessment Lower Extremity Assessment: Defer to PT evaluation   Cervical / Trunk Assessment Cervical / Trunk Assessment: Kyphotic   Communication Communication Communication: No difficulties   Cognition Arousal/Alertness: Awake/alert Behavior During Therapy: WFL for tasks assessed/performed Overall Cognitive Status: History of cognitive impairments - at baseline                                 General Comments: generalized ST Memory deficit.  Decreased complex thought and problem  solving noted.  Decreased safety and awarness of deficits.  Oriented to self, place and time.  Cues for situation.   General Comments       Exercises     Shoulder Instructions      Home Living Family/patient expects to be discharged to:: Private residence Living Arrangements: Children Available Help at Discharge: Family;Available 24 hours/day Type of Home: House Home Access: Stairs to enter [employer] of Steps: 2   Home Layout: One level     Bathroom Shower/Tub: Teacher, early years/pre: Standard Bathroom Accessibility: Yes How Accessible: Accessible via walker Home Equipment: [place] - 2 wheels;Bedside commode;Wheelchair - manual          Prior Functioning/Environment Level of Independence: Needs assistance  Gait / Transfers Assistance Needed: mobility in the home with supervision from daughter and RW. ADL's / Homemaking Assistance Needed: Daughter provide assist for LB ADL, and assists with all IADL, medications, and community mobility.            OT Problem List: Decreased strength;Decreased activity tolerance;Impaired balance (sitting and/or standing);Decreased safety awareness      OT Treatment/Interventions: Self-care/ADL training;Therapeutic activities;Energy conservation;DME and/or AE instruction;Balance training;Patient/family education    OT Goals(Current goals can be found in the care plan section) Acute Rehab OT Goals Patient Stated Goal: I'm ready to get out of here OT Goal Formulation: With patient Time For Goal Achievement: 07/31/21 Potential to Achieve Goals: Good ADL Goals Pt Will Perform Grooming: with supervision;standing Pt Will Perform Upper Body Bathing: with supervision;sitting Pt Will Perform Upper Body Dressing: with supervision;sitting Pt Will Transfer to Toilet: with supervision;ambulating;regular height toilet Pt Will Perform Toileting - Clothing Manipulation and hygiene: with supervision;sit to/from stand  OT  Frequency: Min 2X/week   Barriers to D/C:    none noted       Co-evaluation              AM-PAC OT "6 Clicks" Daily Activity     Outcome Measure Help from another person eating meals?: None Help from another person taking care of personal grooming?: A Little Help from another person toileting, which includes using toliet, bedpan, or urinal?: A Little Help from another person bathing (including washing, rinsing, drying)?: A Lot Help from another person to put on and taking off regular upper body clothing?: A Little Help from another person to put on and taking off regular lower body clothing?: A Lot 6 Click Score: 17   End of Session Equipment Utilized During Treatment: Rolling walker;Gait belt Nurse Communication: Mobility status  Activity Tolerance: Patient tolerated treatment well Patient left: in bed;with call bell/phone within reach;with bed alarm set;with family/visitor present  OT Visit Diagnosis: Unsteadiness on feet (R26.81);Muscle weakness (generalized) (M62.81)                Time: 4920-1007 OT Time Calculation (min): 23 min Charges:  OT General Charges $OT Visit: 1 Visit OT Evaluation $OT Eval Moderate Complexity: 1 Mod  OT Treatments $Self Care/Home Management : 8-22 mins  07/17/2021  RP, OTR/L  Acute Rehabilitation Services  Office:  [phone]   Metta Clines 07/17/2021, 3:54 PM

## 2021-07-18 DIAGNOSIS — I10 Essential (primary) hypertension: Secondary | ICD-10-CM | POA: Diagnosis not present

## 2021-07-18 DIAGNOSIS — N179 Acute kidney failure, unspecified: Secondary | ICD-10-CM | POA: Diagnosis not present

## 2021-07-18 DIAGNOSIS — R627 Adult failure to thrive: Secondary | ICD-10-CM | POA: Diagnosis not present

## 2021-07-18 DIAGNOSIS — J410 Simple chronic bronchitis: Secondary | ICD-10-CM | POA: Diagnosis not present

## 2021-07-18 DIAGNOSIS — R0600 Dyspnea, unspecified: Secondary | ICD-10-CM | POA: Diagnosis not present

## 2021-07-18 LAB — CBC
HCT: 29.3 % — ABNORMAL LOW (ref 36.0–46.0)
Hemoglobin: 9.4 g/dL — ABNORMAL LOW (ref 12.0–15.0)
MCH: 26.9 pg (ref 26.0–34.0)
MCHC: 32.1 g/dL (ref 30.0–36.0)
MCV: 84 fL (ref 80.0–100.0)
Platelets: 284 10*3/uL (ref 150–400)
RBC: 3.49 MIL/uL — ABNORMAL LOW (ref 3.87–5.11)
RDW: 23 % — ABNORMAL HIGH (ref 11.5–15.5)
WBC: 6 10*3/uL (ref 4.0–10.5)
nRBC: 0 % (ref 0.0–0.2)

## 2021-07-18 LAB — BPAM RBC
Blood Product Expiration Date: 202211042359
ISSUE DATE / TIME: 202210211420
Unit Type and Rh: 6200

## 2021-07-18 LAB — TYPE AND SCREEN
ABO/RH(D): A POS
Antibody Screen: NEGATIVE
Unit division: 0

## 2021-07-18 LAB — BASIC METABOLIC PANEL
Anion gap: 7 (ref 5–15)
BUN: 68 mg/dL — ABNORMAL HIGH (ref 8–23)
CO2: 22 mmol/L (ref 22–32)
Calcium: 9 mg/dL (ref 8.9–10.3)
Chloride: 113 mmol/L — ABNORMAL HIGH (ref 98–111)
Creatinine, Ser: 2.21 mg/dL — ABNORMAL HIGH (ref 0.44–1.00)
GFR, Estimated: 22 mL/min — ABNORMAL LOW (ref >60)
Glucose, Bld: 103 mg/dL — ABNORMAL HIGH (ref 70–99)
Potassium: 4.3 mmol/L (ref 3.5–5.1)
Sodium: 142 mmol/L (ref 135–145)

## 2021-07-18 MED ORDER — POLYETHYLENE GLYCOL 3350 17 G PO PACK
17.0000 g | PACK | Freq: Once | ORAL | Status: AC
  Administered 2021-07-18: 17 g via ORAL
  Filled 2021-07-18: qty 1

## 2021-07-18 NOTE — Progress Notes (Signed)
No BM yet, unable to collect sample.

## 2021-07-18 NOTE — Progress Notes (Signed)
Patient voided in the morning, did not used bathroom to void in afternoon, no BM as well. Not able to collect specimens, will pass it on to the next shift.

## 2021-07-18 NOTE — Progress Notes (Signed)
Progress Note  Patient Name: Leslie Reeves Date of Encounter: 07/18/2021  [facility] Cardiologist: [name], MD   Subjective   Deconditioning noted, feeling well laying in chair sleeping easily arousable.  Inpatient Medications    Scheduled Meds:  amLODipine  10 mg Oral Daily   feeding supplement  237 mL Oral BID BM   heparin  5,000 Units Subcutaneous Q8H   isosorbide-hydrALAZINE  1 tablet Oral TID   levothyroxine  37.5 mcg Oral Q0600   megestrol  600 mg Oral Daily   mirtazapine  15 mg Oral QHS   mometasone-formoterol  2 puff Inhalation BID   multivitamin with minerals  1 tablet Oral Daily   rosuvastatin  10 mg Oral Daily   Continuous Infusions:  ferric gluconate (FERRLECIT) IVPB 250 mg (07/18/21 0841)   PRN Meds: acetaminophen, albuterol, nitroGLYCERIN, ondansetron (ZOFRAN) IV   Vital Signs    Vitals:   07/18/21 0400 07/18/21 0413 07/18/21 0744 07/18/21 0841  BP: (!) 130/50  (!) 148/59 (!) 146/63  Pulse: 65  67 67  Resp: 19     Temp: 98.9 F (37.2 C)  98.6 F (37 C)   TempSrc: Oral  Oral   SpO2: 93%  91% 90%  Weight:  42.4 kg    Height:        Intake/Output Summary (Last 24 hours) at 07/18/2021 1056 Last data filed at 07/18/2021 0843 Gross per 24 hour  Intake 983.67 ml  Output 800 ml  Net 183.67 ml   Last 3 Weights 07/18/2021 07/17/2021 07/16/2021  Weight (lbs) 93 lb 6.4 oz 97 lb 10.6 oz 90 lb 6.4 oz  Weight (kg) 42.366 kg 44.3 kg 41.005 kg       Labs    High Sensitivity Troponin:  No results for input(s): TROPONINIHS in the last 720 hours.   Chemistry Recent Labs  Lab 07/15/21 2012 07/16/21 0345 07/17/21 0324 07/18/21 0642  NA 141 144 141 142  K 3.7 3.6 4.0 4.3  CL 107 111 111 113*  CO2 _0 GLUCOSE 96 81 103* 103*  BUN 85* 82* 77* 68*  CREATININE 2.47* 2.33* 2.54* 2.21*  CALCIUM 8.8* 8.8* 8.4* 9.0  PROT 5.8*  --   --   --   ALBUMIN 2.9*  --   --   --   AST 23  --   --   --   ALT 29  --   --   --   ALKPHOS 38   --   --   --   BILITOT 0.4  --   --   --   GFRNONAA 19* 21* 19* 22*  ANIONGAP _1 Lipids No results for input(s): CHOL, TRIG, HDL, LABVLDL, LDLCALC, CHOLHDL in the last 168 hours.  Hematology Recent Labs  Lab 07/16/21 0345 07/17/21 0324 07/17/21 1841 07/18/21 0642  WBC 4.4 4.6  --  6.0  RBC 2.92* 2.90*  --  3.49*  HGB 7.7* 7.7* 9.4* 9.4*  HCT 24.7* 24.3* 29.7* 29.3*  MCV 84.6 83.8  --  84.0  MCH 26.4 26.6  --  26.9  MCHC 31.2 31.7  --  32.1  RDW 24.6* 24.7*  --  23.0*  PLT 274 287  --  284   Thyroid  Recent Labs  Lab 07/15/21 2012  TSH 1.690    BNP Recent Labs  Lab 07/13/21 1040  BNP 50.9    DDimer No results for input(s): DDIMER in  the last 168 hours.   Radiology    No results found.  Cardiac Studies   ECHO this admit   1. Left ventricular ejection fraction, by estimation, is 65 to 70%. The  left ventricle has no regional wall motion abnormalities. There is mild to  moderate left ventricular hypertrophy. Left ventricular diastolic  parameters are consistent with Grade I  diastolic dysfunction (impaired relaxation).   2. Right ventricular systolic function is normal. The right ventricular  size is normal. There is moderately elevated pulmonary artery systolic  pressure.   3. The mitral valve is normal in structure. Trivial mitral valve  regurgitation. No evidence of mitral stenosis.   4. The aortic valve is normal in structure. Aortic valve regurgitation is  not visualized. No aortic stenosis is present.   5. The inferior vena cava is normal in size with greater than 50%  respiratory variability, suggesting right atrial pressure of 3 mmHg.   Patient Profile     80 y.o. female with severe COPD chronic diastolic heart failure with pulmonary hypertension secondary failure to thrive worsening dyspnea.  Back in September she was admitted with volume overload discharged on Lasix.  Over the past week she has had no significant urine output.  From  office was directly admitted.  She actually weighs 4 kg lighter than her discharge weight in September and her creatinine was 2.47 higher than her previous baseline.  Hemoglobin 8.0 significant anemia.  Assessment & Plan    Failure to thrive/weakness/chronic diastolic heart failure - Echo this admit - 65-70 - Previously discharged on Lasix 20 mg every other day.  Currently Lasix was held secondary to profound acute kidney injury, baseline creatinine 1.22 in July up to 1.97 in December now 2.47 on arrival. -Currently getting iron infusion - Challenging situation.  Can be difficult to establish fluid balance.  Her creatinine is slowly decreasing 2.2 with gentle hydration.  She was also transfused blood with an appropriate increase from hemoglobin 7.7 up to 9.4.  Will continue to monitor.     For questions or updates, please contact [place] Please consult [email or web address] for contact info under        Signed, Candee Furbish, MD  07/18/2021, 10:56 AM

## 2021-07-18 NOTE — Evaluation (Signed)
Physical Therapy Evaluation Patient Details Name: Leslie Reeves MRN: [id number] DOB: 02-06-1941 Today's Date: 07/18/2021  History of Present Illness  80 year old female with COPD, CVA, chronic diastolic CHF, prediabetes, CKD , hypothyroidism presented as a direct admission from cardiology office for shortness of breath suspected to be CHF exacerbation but pt found to have symptomatic anemia with Hgb of 7.7  Clinical Impression  Pt presents to PT with general deconditioning and weakness due to illness and inactivity. Can return home with daughter when medically ready. Recommend HHPT at DC to continue to address strength, activity tolerance and mobility.        Recommendations for follow up therapy are one component of a multi-disciplinary discharge planning process, led by the attending physician.  Recommendations may be updated based on patient status, additional functional criteria and insurance authorization.  Follow Up Recommendations Home health PT;Supervision - Intermittent    Equipment Recommendations  None recommended by PT    Recommendations for Other Services       Precautions / Restrictions Precautions Precautions: Fall Restrictions Weight Bearing Restrictions: No      Mobility  Bed Mobility Overal bed mobility: Modified Independent Bed Mobility: Supine to Sit     Supine to sit: Modified independent (Device/Increase time);HOB elevated          Transfers Overall transfer level: Needs assistance Equipment used: Rolling walker (2 wheeled) Transfers: Sit to/from Stand   Stand pivot transfers: Supervision          Ambulation/Gait Ambulation/Gait assistance: Min guard Gait Distance (Feet): 200 Feet Assistive device: Rolling walker (2 wheeled) Gait Pattern/deviations: Step-through pattern;Decreased stride length;Drifts right/left Gait velocity: decr Gait velocity interpretation: 1.31 - 2.62 ft/sec, indicative of limited community ambulator General Gait  Details: Slight instability but no overt loss of balance.  Stairs            Wheelchair Mobility    Modified Rankin (Stroke Patients Only)       Balance Overall balance assessment: Needs assistance Sitting-balance support: No upper extremity supported;Feet supported       Standing balance support: No upper extremity supported Standing balance-Leahy Scale: Fair                               Pertinent Vitals/Pain Pain Assessment: No/denies pain    Home Living Family/patient expects to be discharged to:: Private residence Living Arrangements: Children Available Help at Discharge: Family;Available 24 hours/day Type of Home: House Home Access: Stairs to enter   [employer] of Steps: 2 Home Layout: One level Home Equipment: Walker - 2 wheels;Bedside commode;Wheelchair - manual      Prior Function Level of Independence: Needs assistance   Gait / Transfers Assistance Needed: mobility in the home with supervision from daughter and RW.  ADL's / Homemaking Assistance Needed: Daughter provide assist for LB ADL, and assists with all IADL, medications, and community mobility.        Hand Dominance   Dominant Hand: Right    Extremity/Trunk Assessment   Upper Extremity Assessment Upper Extremity Assessment: Defer to OT evaluation    Lower Extremity Assessment Lower Extremity Assessment: Generalized weakness    Cervical / Trunk Assessment Cervical / Trunk Assessment: Kyphotic  Communication   Communication: No difficulties  Cognition Arousal/Alertness: Awake/alert Behavior During Therapy: WFL for tasks assessed/performed Overall Cognitive Status: History of cognitive impairments - at baseline  General Comments General comments (skin integrity, edema, etc.): SpO2 97% after amb on RA. Dyspnea 2/4    Exercises     Assessment/Plan    PT Assessment Patient needs continued PT  services  PT Problem List Decreased strength;Decreased balance;Decreased mobility;Decreased activity tolerance       PT Treatment Interventions DME instruction;Functional mobility training;Balance training;Patient/family education;Gait training;Therapeutic activities;Therapeutic exercise    PT Goals (Current goals can be found in the Care Plan section)  Acute Rehab PT Goals Patient Stated Goal: Go home PT Goal Formulation: With patient Time For Goal Achievement: 07/25/21 Potential to Achieve Goals: Good    Frequency Min 3X/week   Barriers to discharge        Co-evaluation               AM-PAC PT "6 Clicks" Mobility  Outcome Measure Help needed turning from your back to your side while in a flat bed without using bedrails?: None Help needed moving from lying on your back to sitting on the side of a flat bed without using bedrails?: None Help needed moving to and from a bed to a chair (including a wheelchair)?: A Little Help needed standing up from a chair using your arms (e.g., wheelchair or bedside chair)?: A Little Help needed to walk in hospital room?: A Little Help needed climbing 3-5 steps with a railing? : A Little 6 Click Score: 20    End of Session   Activity Tolerance: Patient tolerated treatment well Patient left: in chair;with call bell/phone within reach;with chair alarm set   PT Visit Diagnosis: Unsteadiness on feet (R26.81);Muscle weakness (generalized) (M62.81)    Time: 3557-3220 PT Time Calculation (min) (ACUTE ONLY): 14 min   Charges:   PT Evaluation $PT Eval Low Complexity: [place] Pager [phone] Office [place] 07/18/2021, 9:32 AM

## 2021-07-18 NOTE — Progress Notes (Signed)
[facility] note                                                                              Patient Demographics  Leslie Reeves, is a 80 y.o. female, DOB - 10-27-1940, RTM:[id number]  Admit date - 07/15/2021   Admitting Physician Thayer Headings, MD  Outpatient Primary MD for the patient is [name], [place]  Outpatient specialists:   LOS - 2  days   Medical records reviewed and are as summarized below:    No chief complaint on file.      Brief summary   Patient is a 80 year old female with COPD, CVA, chronic diastolic CHF, prediabetes, CKD , hypothyroidism presented as a direct admission from cardiology office for shortness of breath suspected to be CHF exacerbation.  Patient had reported symptoms of 1 week of increasing shortness of breath, worse with exertion and better at rest.  Normally sleeps on a recliner so was unsure if she had shortness of breath if laying flat.  Also reported lower extremity edema about a week ago but improved when she elevated her feet at night.  Reported compliance with Lasix, no wheezing, coughing or fevers.  Not on home O2, denies any current tobacco use.  Reported good appetite but has lost at least 26 pounds over several months.   Patient was recently hospitalized in September for acute diastolic CHF and was started on low-dose Lasix 20 mg every other day. Initially admitted to cardiology service, [place] assumed care on 10/21  Assessment & Plan   Dyspnea with exertion, acute on chronic anemia, Symptomatic anemia -Initially thought to be from acute diastolic CHF however appeared to be somewhat volume depleted.  Also has history of COPD, acute kidney injury on CKD stage IIIb/IV, acute on chronic normocytic anemia, likely symptoms from symptomatic anemia -Lasix held, renal function improving -Baseline hemoglobin  ~11, on admission  -Anemia panel showed normal B12, folate, ferritin 17, %sat 11, Fe 32, TIBC 288,  -Status post 1 unit  packed RBC transfusion on 10/21, hemoglobin stable at 9.4.  Receiving IV ferric gluconate x 4 doses  -FOBT still pending, patient had a BM last night, unfortunately the sample not sent. -UPEP, UA to assess for proteinuria was ordered, still pending -Follow SPEP, immunofixation -If FOBT negative and H&H remained stable, will refer to outpatient hematology for further work-up -Home O2 evaluation prior to discharge.   Active Problems: Chronic diastolic CHF -Currently stable, not in any volume overload -Lasix was held, creatinine trended up to 2.5, received gentle IV fluid hydration -2D echo showed EF of 65 to 70%, G1 DD  -Cardiology following, will defer management  COPD -Patient has underlying severe COPD, does not use O2 at home -Stable, no wheezing, continue Dulera, albuterol nebs as needed  Failure to thrive -PT evaluation recommended home health PT, no OT follow-up -Continue Megace, Remeron -Patient reports she lives with her family, otherwise is functional with her ADLs, alert and oriented x3  Hypothyroidism -Continue Synthroid, TSH 1.6  Acute on CKD stage IV -Baseline creatinine 1.7-1.9, creatinine was 2.28 in 05/2021, likely worsened due to diuresis and anemia -Presented with creatinine of  2.4, Lasix held, creatinine improving, 2.2 -Lasix held  moderate to severe protein calorie malnutrition, underweight Estimated body mass index is 17.65 kg/m as calculated from the following:   Height as of this encounter: _0  (1.549 m).   Weight as of this encounter: 42.4 kg. -Nutrition consult  Code Status: Full CODE STATUS DVT Prophylaxis:  heparin injection 5,000 Units Start: 07/15/21 2200   Level of Care: Level of care: Telemetry Cardiac Family Communication: Discussed all imaging results, lab results, explained to the patient's daughter on phone on 10/21   Disposition Plan:     Status is: Inpatient,  Time Spent in minutes 25 minutes  Procedures:  2D  echo   Antimicrobials:   Anti-infectives (From admission, onward)    None          Medications  Scheduled Meds:  amLODipine  10 mg Oral Daily   feeding supplement  237 mL Oral BID BM   heparin  5,000 Units Subcutaneous Q8H   isosorbide-hydrALAZINE  1 tablet Oral TID   levothyroxine  37.5 mcg Oral Q0600   megestrol  600 mg Oral Daily   mirtazapine  15 mg Oral QHS   mometasone-formoterol  2 puff Inhalation BID   multivitamin with minerals  1 tablet Oral Daily   polyethylene glycol  17 g Oral Once   rosuvastatin  10 mg Oral Daily   Continuous Infusions:  ferric gluconate (FERRLECIT) IVPB 250 mg (07/18/21 0841)   PRN Meds:.acetaminophen, albuterol, nitroGLYCERIN, ondansetron (ZOFRAN) IV      Subjective:   Ivoree Felmlee was seen and examined today.  Sitting up in the chair, denies any specific complaints.  Feels better today.  No acute chest pain or shortness of breath.  No wheezing.  Had a large BM last night, per patient no blood noted however no sample was sent .  Objective:   Vitals:   07/18/21 0413 07/18/21 0744 07/18/21 0841 07/18/21 1149  BP:  (!) 148/59 (!) 146/63 (!) 157/59  Pulse:  67 67 78  Resp:      Temp:  98.6 F (37 C)    TempSrc:  Oral    SpO2:  91% 90% 97%  Weight: 42.4 kg     Height:        Intake/Output Summary (Last 24 hours) at 07/18/2021 1251 Last data filed at 07/18/2021 0843 Gross per 24 hour  Intake 983.67 ml  Output 450 ml  Net 533.67 ml     Wt Readings from Last 3 Encounters:  07/18/21 42.4 kg  07/15/21 41.4 kg  07/13/21 41.1 kg   Physical Exam General: Alert and oriented x 3, NAD Cardiovascular: S1 S2 clear, RRR. No pedal edema b/l Respiratory: CTAB, no wheezing, rales or rhonchi Gastrointestinal: Soft, nontender, nondistended, NBS Ext: no pedal edema bilaterally Neuro: no new deficits Psych: Normal affect and demeanor, alert and oriented x3     Data Reviewed:  I have personally reviewed following labs and  imaging studies  Micro Results Recent Results (from the past 240 hour(s))  Resp Panel by RT-PCR (Flu A&B, Covid) Nasopharyngeal Swab     Status: None   Collection Time: 07/15/21  6:23 PM   Specimen: Nasopharyngeal Swab; Nasopharyngeal(NP) swabs in vial transport medium  Result Value Ref Range Status   SARS Coronavirus 2 by RT PCR NEGATIVE NEGATIVE Final    Comment: (NOTE) SARS-CoV-2 target nucleic acids are NOT DETECTED.  The SARS-CoV-2 RNA is generally detectable in upper respiratory specimens during the acute phase of infection. The  lowest concentration of SARS-CoV-2 viral copies this assay can detect is 138 copies/mL. A negative result does not preclude SARS-Cov-2 infection and should not be used as the sole basis for treatment or other patient management decisions. A negative result may occur with  improper specimen collection/handling, submission of specimen other than nasopharyngeal swab, presence of viral mutation(s) within the areas targeted by this assay, and inadequate number of viral copies(<138 copies/mL). A negative result must be combined with clinical observations, patient history, and epidemiological information. The expected result is Negative.  Fact Sheet for Patients:  EntrepreneurPulse.com.au  Fact Sheet for Healthcare Providers:  IncredibleEmployment.be  This test is no t yet approved or cleared by the [place] FDA and  has been authorized for detection and/or diagnosis of SARS-CoV-2 by FDA under an Emergency Use Authorization (EUA). This EUA will remain  in effect (meaning this test can be used) for the duration of the COVID-19 declaration under Section 564(b)(1) of the Act, 21 U.S.C.section 360bbb-3(b)(1), unless the authorization is terminated  or revoked sooner.       Influenza A by PCR NEGATIVE NEGATIVE Final   Influenza B by PCR NEGATIVE NEGATIVE Final    Comment: (NOTE) The Xpert Xpress SARS-CoV-2/FLU/RSV  plus assay is intended as an aid in the diagnosis of influenza from Nasopharyngeal swab specimens and should not be used as a sole basis for treatment. Nasal washings and aspirates are unacceptable for Xpert Xpress SARS-CoV-2/FLU/RSV testing.  Fact Sheet for Patients: EntrepreneurPulse.com.au  Fact Sheet for Healthcare Providers: IncredibleEmployment.be  This test is not yet approved or cleared by the [place] FDA and has been authorized for detection and/or diagnosis of SARS-CoV-2 by FDA under an Emergency Use Authorization (EUA). This EUA will remain in effect (meaning this test can be used) for the duration of the COVID-19 declaration under Section 564(b)(1) of the Act, 21 U.S.C. section 360bbb-3(b)(1), unless the authorization is terminated or revoked.  Performed at [facility], [place] [address], [place], [place] [address]     Radiology Reports DG Bone Density  Result Date: 07/07/2021 EXAM: DUAL X-RAY ABSORPTIOMETRY (DXA) FOR BONE MINERAL DENSITY IMPRESSION: Referring Physician:  [name] Your patient completed a bone mineral density test using GE Lunar iDXA system (analysis version: 16). Technologist: [place] PATIENT: Name: Janiqua, Friscia Patient ID:  [id number] Birth Date: 29-May-1941 Height:     61.0 in. Sex:         Female Measured:   07/06/2021 Weight:     98.1 lbs. Indications: Advanced Age, Bilateral Ovariectomy (65.51), Estrogen Deficient, Hysterectomy, Postmenopausal Fractures: None Treatments: None ASSESSMENT: The BMD measured at Forearm Radius 33% is 0.590 g/cm2 with a T-score of -3.4. This patient is considered OSTEOPOROTIC according to [facility]) criteria. The quality of the exam is good. The lumbar spine was excluded due to degenerative changes. Site Region Measured Date Measured Age YA BMD Significant CHANGE T-score Left Forearm Radius 33% 07/06/2021 80.4 -3.4 0.590 g/cm2 DualFemur Neck Right 07/06/2021  80.4 -2.3 0.722 g/cm2 * DualFemur Neck Right 07/03/2018 77.4 -1.4 0.845 g/cm2 DualFemur Total Mean 07/06/2021 80.4 -1.5 0.823 g/cm2 * DualFemur Total Mean 07/03/2018 77.4 -0.8 0.906 g/cm2 World Health Organization [facility]) criteria for post-menopausal, Caucasian Women: Normal       T-score at or above -1 SD Osteopenia   T-score between -1 and -2.5 SD Osteoporosis T-score at or below -2.5 SD RECOMMENDATION: 1. All patients should optimize calcium and vitamin D intake. 2. Consider FDA-approved medical therapies in postmenopausal women and  men aged 27 years and older, based on the following: a. A hip or vertebral (clinical or morphometric) fracture. b. T-score = -2.5 at the femoral neck or spine after appropriate evaluation to exclude secondary causes. c. Low bone mass (T-score between -1.0 and -2.5 at the femoral neck or spine) and a 10-year probability of a hip fracture = 3% or a 10-year probability of a major osteoporosis-related fracture = 20% based on the [place]-adapted WHO algorithm. d. Clinician judgment and/or patient preferences may indicate treatment for people with 10-year fracture probabilities above or below these levels. FOLLOW-UP: Patients with diagnosis of osteoporosis or at high risk for fracture should have regular bone mineral density tests.? Patients eligible for Medicare are allowed routine testing every 2 years.? The testing frequency can be increased to one year for patients who have rapidly progressing disease, are receiving or discontinuing medical therapy to restore bone mass, or have additional risk factors. I have reviewed this study and agree with the findings. Mark A. Thornton Papas, M.D. [facility] Electronically Signed   By: Lavonia Dana M.D.   On: 07/07/2021 08:23   DG CHEST PORT 1 VIEW  Result Date: 07/15/2021 CLINICAL DATA:  Shortness of breath EXAM: PORTABLE CHEST 1 VIEW COMPARISON:  06/11/2021 FINDINGS: There is hyperinflation of the lungs compatible with COPD. Heart is upper  limits normal in size. Aortic atherosclerosis. No confluent airspace opacities or effusions. No acute bony abnormality. IMPRESSION: COPD.  No active disease. Electronically Signed   By: Rolm Baptise M.D.   On: 07/15/2021 20:06   ECHOCARDIOGRAM LIMITED  Result Date: 07/16/2021    ECHOCARDIOGRAM LIMITED REPORT   Patient Name:   DAAIYAH BAUMERT Date of Exam: 07/16/2021 Medical Rec #:  [id number]      Height:       61.0 in Accession #:    [id number]     Weight:       90.4 lb Date of Birth:  02-May-1941       BSA:          1.348 m Patient Age:    47 years       BP:           147/57 mmHg Patient Gender: F              HR:           68 bpm. Exam Location:  Inpatient Procedure: Limited Echo, Limited Color Doppler and Cardiac Doppler Indications:    dyspnea  History:        Patient has prior history of Echocardiogram examinations, most                 recent 06/12/2021. COPD; Risk Factors:Diabetes and Hypertension.  Sonographer:    Johny Chess RDCS Referring Phys: [id number] [place]  1. Left ventricular ejection fraction, by estimation, is 65 to 70%. The left ventricle has no regional wall motion abnormalities. There is mild to moderate left ventricular hypertrophy. Left ventricular diastolic parameters are consistent with Grade I diastolic dysfunction (impaired relaxation).  2. Right ventricular systolic function is normal. The right ventricular size is normal. There is moderately elevated pulmonary artery systolic pressure.  3. The mitral valve is normal in structure. Trivial mitral valve regurgitation. No evidence of mitral stenosis.  4. The aortic valve is normal in structure. Aortic valve regurgitation is not visualized. No aortic stenosis is present.  5. The inferior vena cava is normal in size with greater than 50% respiratory variability, suggesting right  atrial pressure of 3 mmHg. FINDINGS  Left Ventricle: Left ventricular ejection fraction, by estimation, is 65 to 70%. The left ventricle  has no regional wall motion abnormalities. The left ventricular internal cavity size was normal in size. There is mild left ventricular hypertrophy. Left  ventricular diastolic parameters are consistent with Grade I diastolic dysfunction (impaired relaxation). Right Ventricle: The right ventricular size is normal. No increase in right ventricular wall thickness. Right ventricular systolic function is normal. There is moderately elevated pulmonary artery systolic pressure. The tricuspid regurgitant velocity is 3.56 m/s, and with an assumed right atrial pressure of 3 mmHg, the estimated right ventricular systolic pressure is 22.2 mmHg. Left Atrium: Left atrial size was normal in size. Right Atrium: Right atrial size was normal in size. Pericardium: There is no evidence of pericardial effusion. Mitral Valve: The mitral valve is normal in structure. Trivial mitral valve regurgitation. No evidence of mitral valve stenosis. Tricuspid Valve: The tricuspid valve is normal in structure. Tricuspid valve regurgitation is trivial. No evidence of tricuspid stenosis. Aortic Valve: The aortic valve is normal in structure. Aortic valve regurgitation is not visualized. No aortic stenosis is present. Pulmonic Valve: The pulmonic valve was normal in structure. Pulmonic valve regurgitation is trivial. No evidence of pulmonic stenosis. Aorta: The aortic root is normal in size and structure. Venous: The inferior vena cava is normal in size with greater than 50% respiratory variability, suggesting right atrial pressure of 3 mmHg. IAS/Shunts: No atrial level shunt detected by color flow Doppler. LEFT VENTRICLE PLAX 2D LVIDd:         4.10 cm   Diastology LVIDs:         2.40 cm   LV e' medial:    8.59 cm/s LV PW:         0.80 cm   LV E/e' medial:  12.0 LV IVS:        0.90 cm   LV e' lateral:   11.90 cm/s LVOT diam:     1.70 cm   LV E/e' lateral: 8.7 LV SV:         67 LV SV Index:   49 LVOT Area:     2.27 cm  IVC IVC diam: 1.20 cm LEFT  ATRIUM         Index LA diam:    3.00 cm 2.23 cm/m  AORTIC VALVE LVOT Vmax:   137.00 cm/s LVOT Vmean:  82.900 cm/s LVOT VTI:    0.293 m  AORTA Ao Asc diam: 3.20 cm MITRAL VALVE                TRICUSPID VALVE MV Area (PHT): 3.37 cm     TR Peak grad:   50.7 mmHg MV Decel Time: 225 msec     TR Vmax:        356.00 cm/s MV E velocity: 103.00 cm/s MV A velocity: 114.00 cm/s  SHUNTS MV E/A ratio:  0.90         Systemic VTI:  0.29 m                             Systemic Diam: 1.70 cm Glori Bickers MD Electronically signed by Glori Bickers MD Signature Date/Time: 07/16/2021/4:28:36 PM    Final     Lab Data:  CBC: Recent Labs  Lab 07/15/21 2012 07/16/21 0345 07/17/21 0324 07/17/21 1841 07/18/21 0642  WBC 5.0 4.4 4.6  --  6.0  HGB 8.0* 7.7*  7.7* 9.4* 9.4*  HCT 25.5* 24.7* 24.3* 29.7* 29.3*  MCV 84.4 84.6 83.8  --  84.0  PLT 272 274 287  --  324   Basic Metabolic Panel: Recent Labs  Lab 07/15/21 2012 07/16/21 0345 07/17/21 0324 07/18/21 0642  NA 141 144 141 142  K 3.7 3.6 4.0 4.3  CL 107 111 111 113*  CO2 _0 GLUCOSE 96 81 103* 103*  BUN 85* 82* 77* 68*  CREATININE 2.47* 2.33* 2.54* 2.21*  CALCIUM 8.8* 8.8* 8.4* 9.0   GFR: Estimated Creatinine Clearance: 13.6 mL/min (A) (by C-G formula based on SCr of 2.21 mg/dL (H)). Liver Function Tests: Recent Labs  Lab 07/15/21 2012  AST 23  ALT 29  ALKPHOS 38  BILITOT 0.4  PROT 5.8*  ALBUMIN 2.9*   No results for input(s): LIPASE, AMYLASE in the last 168 hours. No results for input(s): AMMONIA in the last 168 hours. Coagulation Profile: No results for input(s): INR, PROTIME in the last 168 hours. Cardiac Enzymes: No results for input(s): CKTOTAL, CKMB, CKMBINDEX, TROPONINI in the last 168 hours. BNP (last 3 results) No results for input(s): PROBNP in the last 8760 hours. HbA1C: No results for input(s): HGBA1C in the last 72 hours. CBG: No results for input(s): GLUCAP in the last 168 hours. Lipid Profile: No results  for input(s): CHOL, HDL, LDLCALC, TRIG, CHOLHDL, LDLDIRECT in the last 72 hours. Thyroid Function Tests: Recent Labs    07/15/21 2012  TSH 1.690   Anemia Panel: Recent Labs    07/16/21 0345  VITAMINB12 324  FOLATE 36.0  FERRITIN 17  TIBC 288  IRON 32   Urine analysis:    Component Value Date/Time   COLORURINE YELLOW 06/12/2021 0422   APPEARANCEUR HAZY (A) 06/12/2021 0422   LABSPEC 1.009 06/12/2021 0422   PHURINE 5.0 06/12/2021 0422   GLUCOSEU NEGATIVE 06/12/2021 0422   HGBUR NEGATIVE 06/12/2021 0422   BILIRUBINUR Negative 07/13/2021 1654   KETONESUR NEGATIVE 06/12/2021 0422   PROTEINUR Positive (A) 07/13/2021 1654   PROTEINUR 100 (A) 06/12/2021 0422   UROBILINOGEN 0.2 07/13/2021 1654   NITRITE Negative 07/13/2021 1654   NITRITE NEGATIVE 06/12/2021 0422   LEUKOCYTESUR Negative 07/13/2021 1654   LEUKOCYTESUR NEGATIVE 06/12/2021 0422     Taeler Winning M.D. [facility] 07/18/2021, 12:51 PM  Available via Epic secure chat 7am-7pm After 7 pm, please refer to night coverage provider listed on amion.

## 2021-07-19 DIAGNOSIS — N179 Acute kidney failure, unspecified: Secondary | ICD-10-CM | POA: Diagnosis not present

## 2021-07-19 DIAGNOSIS — R627 Adult failure to thrive: Secondary | ICD-10-CM | POA: Diagnosis not present

## 2021-07-19 DIAGNOSIS — I10 Essential (primary) hypertension: Secondary | ICD-10-CM | POA: Diagnosis not present

## 2021-07-19 DIAGNOSIS — I5033 Acute on chronic diastolic (congestive) heart failure: Secondary | ICD-10-CM

## 2021-07-19 DIAGNOSIS — J410 Simple chronic bronchitis: Secondary | ICD-10-CM | POA: Diagnosis not present

## 2021-07-19 LAB — URINALYSIS, ROUTINE W REFLEX MICROSCOPIC
Bacteria, UA: NONE SEEN
Bilirubin Urine: NEGATIVE
Glucose, UA: NEGATIVE mg/dL
Hgb urine dipstick: NEGATIVE
Ketones, ur: NEGATIVE mg/dL
Leukocytes,Ua: NEGATIVE
Nitrite: NEGATIVE
Protein, ur: 100 mg/dL — AB
Specific Gravity, Urine: 1.014 (ref 1.005–1.030)
pH: 5 (ref 5.0–8.0)

## 2021-07-19 LAB — OCCULT BLOOD X 1 CARD TO LAB, STOOL: Fecal Occult Bld: NEGATIVE

## 2021-07-19 MED ORDER — POLYETHYLENE GLYCOL 3350 17 G PO PACK
17.0000 g | PACK | Freq: Once | ORAL | Status: AC
  Administered 2021-07-19: 17 g via ORAL
  Filled 2021-07-19: qty 1

## 2021-07-19 MED ORDER — BISACODYL 10 MG RE SUPP
10.0000 mg | Freq: Once | RECTAL | Status: AC
  Administered 2021-07-19: 10 mg via RECTAL
  Filled 2021-07-19: qty 1

## 2021-07-19 NOTE — Assessment & Plan Note (Signed)
Echocardiogram this admission shows EF of 70%.  She was previously discharged on Lasix 20 mg every other day.  Lasix is currently held because of acute kidney injury.  Baseline creatinine was 1.22 in July but quickly went up to 1.97 in December and is now 2.5 on arrival.  At last check slightly decreased to 2.2 with gentle hydration/blood.  Challenging situation.  Will be difficult to establish proper fluid balance.  Continue with supportive care.  No diuretic at this point.

## 2021-07-19 NOTE — Progress Notes (Signed)
Progress Note  Patient Name: Leslie Reeves Date of Encounter: 07/19/2021  [facility] Cardiologist: [name], MD   Subjective   Deconditioning noted, feeling well laying in chair sleeping easily arousable.  Inpatient Medications    Scheduled Meds:  amLODipine  10 mg Oral Daily   feeding supplement  237 mL Oral BID BM   heparin  5,000 Units Subcutaneous Q8H   isosorbide-hydrALAZINE  1 tablet Oral TID   levothyroxine  37.5 mcg Oral Q0600   megestrol  600 mg Oral Daily   mirtazapine  15 mg Oral QHS   mometasone-formoterol  2 puff Inhalation BID   multivitamin with minerals  1 tablet Oral Daily   rosuvastatin  10 mg Oral Daily   Continuous Infusions:  ferric gluconate (FERRLECIT) IVPB 250 mg (07/18/21 0841)   PRN Meds: acetaminophen, albuterol, nitroGLYCERIN, ondansetron (ZOFRAN) IV   Vital Signs    Vitals:   07/18/21 2050 07/19/21 0534 07/19/21 0824 07/19/21 0859  BP: (!) 127/58 136/62  (!) 137/59  Pulse: 73 65  72  Resp: _0 Temp: 98.9 F (37.2 C) 98.4 F (36.9 C)  98.8 F (37.1 C)  TempSrc: Oral Oral  Oral  SpO2: 94% 95% 90% 100%  Weight:  43.6 kg    Height:        Intake/Output Summary (Last 24 hours) at 07/19/2021 1038 Last data filed at 07/19/2021 0914 Gross per 24 hour  Intake 1230 ml  Output 350 ml  Net 880 ml   Last 3 Weights 07/19/2021 07/18/2021 07/17/2021  Weight (lbs) 96 lb 1.6 oz 93 lb 6.4 oz 97 lb 10.6 oz  Weight (kg) 43.591 kg 42.366 kg 44.3 kg       Labs    High Sensitivity Troponin:  No results for input(s): TROPONINIHS in the last 720 hours.   Chemistry Recent Labs  Lab 07/15/21 2012 07/16/21 0345 07/17/21 0324 07/18/21 0642  NA 141 144 141 142  K 3.7 3.6 4.0 4.3  CL 107 111 111 113*  CO2 _1 GLUCOSE 96 81 103* 103*  BUN 85* 82* 77* 68*  CREATININE 2.47* 2.33* 2.54* 2.21*  CALCIUM 8.8* 8.8* 8.4* 9.0  PROT 5.8*  --   --   --   ALBUMIN 2.9*  --   --   --   AST 23  --   --   --   ALT 29  --    --   --   ALKPHOS 38  --   --   --   BILITOT 0.4  --   --   --   GFRNONAA 19* 21* 19* 22*  ANIONGAP _2 Lipids No results for input(s): CHOL, TRIG, HDL, LABVLDL, LDLCALC, CHOLHDL in the last 168 hours.  Hematology Recent Labs  Lab 07/16/21 0345 07/17/21 0324 07/17/21 1841 07/18/21 0642  WBC 4.4 4.6  --  6.0  RBC 2.92* 2.90*  --  3.49*  HGB 7.7* 7.7* 9.4* 9.4*  HCT 24.7* 24.3* 29.7* 29.3*  MCV 84.6 83.8  --  84.0  MCH 26.4 26.6  --  26.9  MCHC 31.2 31.7  --  32.1  RDW 24.6* 24.7*  --  23.0*  PLT 274 287  --  284   Thyroid  Recent Labs  Lab 07/15/21 2012  TSH 1.690    BNP Recent Labs  Lab 07/13/21 1040  BNP 50.9    DDimer No results for input(s):  DDIMER in the last 168 hours.   Radiology    No results found.  Cardiac Studies   ECHO this admit   1. Left ventricular ejection fraction, by estimation, is 65 to 70%. The  left ventricle has no regional wall motion abnormalities. There is mild to  moderate left ventricular hypertrophy. Left ventricular diastolic  parameters are consistent with Grade I  diastolic dysfunction (impaired relaxation).   2. Right ventricular systolic function is normal. The right ventricular  size is normal. There is moderately elevated pulmonary artery systolic  pressure.   3. The mitral valve is normal in structure. Trivial mitral valve  regurgitation. No evidence of mitral stenosis.   4. The aortic valve is normal in structure. Aortic valve regurgitation is  not visualized. No aortic stenosis is present.   5. The inferior vena cava is normal in size with greater than 50%  respiratory variability, suggesting right atrial pressure of 3 mmHg.   Patient Profile     80 y.o. female with severe COPD chronic diastolic heart failure with pulmonary hypertension secondary failure to thrive worsening dyspnea.  Back in September she was admitted with volume overload discharged on Lasix.  Over the past week she has had no significant  urine output.  From office was directly admitted.  She actually weighs 4 kg lighter than her discharge weight in September and her creatinine was 2.47 higher than her previous baseline.  Hemoglobin 8.0 significant anemia.  Assessment & Plan    Failure to thrive in adult Echocardiogram this admission shows EF of 70%.  She was previously discharged on Lasix 20 mg every other day.  Lasix is currently held because of acute kidney injury.  Baseline creatinine was 1.22 in July but quickly went up to 1.97 in December and is now 2.5 on arrival.  At last check slightly decreased to 2.2 with gentle hydration/blood.  Challenging situation.  Will be difficult to establish proper fluid balance.  Continue with supportive care.  No diuretic at this point.  Severe anemia Posttransfusion.  Received iron infusion yesterday as well.  Acute on chronic diastolic CHF (congestive heart failure), NYHA class 3 (HCC) Multifactorial shortness of breath, COPD as well.  With acute kidney injury, holding off further diuretic at this time.   Will continue to monitor.     For questions or updates, please contact [place] Please consult [email or web address] for contact info under        Signed, Candee Furbish, MD  07/19/2021, 10:38 AM

## 2021-07-19 NOTE — Assessment & Plan Note (Signed)
Posttransfusion.  Received iron infusion yesterday as well.

## 2021-07-19 NOTE — Progress Notes (Signed)
Around 1900 patient fell, VSS, no injury observe , DR. Opyd  and daughter notified.Fall precautions initiated. Report given to night RN. Will continue to monitor the patient.

## 2021-07-19 NOTE — Progress Notes (Signed)
[facility] note                                                                              Patient Demographics  Leslie Reeves, is a 80 y.o. female, DOB - [birth year], BZX:[id number]  Admit date - 07/15/2021   Admitting Physician Thayer Headings, MD  Outpatient Primary MD for the patient is [name], [place]  Outpatient specialists:   LOS - 3  days   Medical records reviewed and are as summarized below:    No chief complaint on file.      Brief summary   Patient is a 80 year old female with COPD, CVA, chronic diastolic CHF, prediabetes, CKD , hypothyroidism presented as a direct admission from cardiology office for shortness of breath suspected to be CHF exacerbation.  Patient had reported symptoms of 1 week of increasing shortness of breath, worse with exertion and better at rest.  Normally sleeps on a recliner so was unsure if she had shortness of breath if laying flat.  Also reported lower extremity edema about a week ago but improved when she elevated her feet at night.  Reported compliance with Lasix, no wheezing, coughing or fevers.  Not on home O2, denies any current tobacco use.  Reported good appetite but has lost at least 26 pounds over several months.   Patient was recently hospitalized in September for acute diastolic CHF and was started on low-dose Lasix 20 mg every other day. Initially admitted to cardiology service, [facility] assumed care on 10/21  Assessment & Plan   Dyspnea with exertion, acute on chronic anemia, Symptomatic anemia -Initially thought to be from acute diastolic CHF however appeared to be somewhat volume depleted.  Also has history of COPD, acute kidney injury on CKD stage IIIb/IV, acute on chronic normocytic anemia, likely symptoms from symptomatic anemia -Lasix held, renal function improving -Baseline hemoglobin  ~11, on admission  -Anemia panel showed normal B12, folate, ferritin 17, %sat 11, Fe 32, TIBC 288,  -Status post 1 unit  packed RBC transfusion on 10/21, Hb has remained stable 9.4  -receiving IV ferric gluconate x 4 doses  -FOBT still pending, sample not sent -SPEP, immunofixation in process, urinalysis, UPEP still not sent  -Home O2 evaluation prior to discharge. -No report of obvious bleeding, if H&H remained stable, will defer further work-up of anemia to PCP.  Active Problems: Chronic diastolic CHF -Currently stable, not in any volume overload -Lasix was held, creatinine trended up to 2.5, creatinine now improved to 2.2 (baseline 1.7-1.9) -2D echo showed EF of 65 to 70%, G1 DD  -Cardiology following, will defer management  COPD -Patient has underlying severe COPD, does not use O2 at home -Currently stable, no wheezing  Failure to thrive -PT evaluation recommended home health PT, no OT follow-up -Continue Megace, Remeron -Patient reports she lives with her family, otherwise is functional with her ADLs, alert and oriented x3  Hypothyroidism -Continue Synthroid, TSH 1.6  Acute on CKD stage IV -Baseline creatinine 1.7-1.9, creatinine was 2.28 in 05/2021, likely worsened due to diuresis and anemia -Lasix currently held, creatinine improving to 2.2, will recheck in a.m.  moderate to severe protein  calorie malnutrition, underweight Estimated body mass index is 18.16 kg/m as calculated from the following:   Height as of this encounter: 5' 1" (1.549 m).   Weight as of this encounter: 43.6 kg. -Nutrition consult  Code Status: Full CODE STATUS DVT Prophylaxis:  heparin injection 5,000 Units Start: 07/15/21 2200   Level of Care: Level of care: Telemetry Cardiac Family Communication: Discussed all imaging results, lab results, explained to the patient's daughter on phone on 10/21   Disposition Plan:     Status is: Inpatient,  Time Spent in minutes 25 minutes  Procedures:  2D echo   Antimicrobials:   Anti-infectives (From admission, onward)    None           Medications  Scheduled Meds:  amLODipine  10 mg Oral Daily   feeding supplement  237 mL Oral BID BM   heparin  5,000 Units Subcutaneous Q8H   isosorbide-hydrALAZINE  1 tablet Oral TID   levothyroxine  37.5 mcg Oral Q0600   megestrol  600 mg Oral Daily   mirtazapine  15 mg Oral QHS   mometasone-formoterol  2 puff Inhalation BID   multivitamin with minerals  1 tablet Oral Daily   rosuvastatin  10 mg Oral Daily   Continuous Infusions:  ferric gluconate (FERRLECIT) IVPB 250 mg (07/18/21 0841)   PRN Meds:.acetaminophen, albuterol, nitroGLYCERIN, ondansetron (ZOFRAN) IV      Subjective:   Leslie Reeves was seen and examined today.  Resting up in bed, no complaints.  Hoping to go home soon.  No acute chest pain or shortness of breath.  No BM today.   Objective:   Vitals:   07/19/21 0534 07/19/21 0824 07/19/21 0859 07/19/21 1201  BP: 136/62  (!) 137/59 (!) 137/55  Pulse: 65  72 78  Resp: _0 Temp: 98.4 F (36.9 C)  98.8 F (37.1 C) 98.5 F (36.9 C)  TempSrc: Oral  Oral Oral  SpO2: 95% 90% 100% 95%  Weight: 43.6 kg     Height:        Intake/Output Summary (Last 24 hours) at 07/19/2021 1409 Last data filed at 07/19/2021 0914 Gross per 24 hour  Intake 480 ml  Output 350 ml  Net 130 ml     Wt Readings from Last 3 Encounters:  07/19/21 43.6 kg  07/15/21 41.4 kg  07/13/21 41.1 kg   Physical Exam General: Sleepy but easily arousable, NAD Cardiovascular: S1 S2 clear, RRR. No pedal edema b/l Respiratory: CTAB, no wheezing, rales Gastrointestinal: Soft, nontender, nondistended, NBS Ext: no pedal edema bilaterally    Data Reviewed:  I have personally reviewed following labs and imaging studies  Micro Results Recent Results (from the past 240 hour(s))  Resp Panel by RT-PCR (Flu A&B, Covid) Nasopharyngeal Swab     Status: None   Collection Time: 07/15/21  6:23 PM   Specimen: Nasopharyngeal Swab; Nasopharyngeal(NP) swabs in vial transport medium   Result Value Ref Range Status   SARS Coronavirus 2 by RT PCR NEGATIVE NEGATIVE Final    Comment: (NOTE) SARS-CoV-2 target nucleic acids are NOT DETECTED.  The SARS-CoV-2 RNA is generally detectable in upper respiratory specimens during the acute phase of infection. The lowest concentration of SARS-CoV-2 viral copies this assay can detect is 138 copies/mL. A negative result does not preclude SARS-Cov-2 infection and should not be used as the sole basis for treatment or other patient management decisions. A negative result may occur with  improper specimen collection/handling, submission of specimen other  than nasopharyngeal swab, presence of viral mutation(s) within the areas targeted by this assay, and inadequate number of viral copies(<138 copies/mL). A negative result must be combined with clinical observations, patient history, and epidemiological information. The expected result is Negative.  Fact Sheet for Patients:  EntrepreneurPulse.com.au  Fact Sheet for Healthcare Providers:  IncredibleEmployment.be  This test is no t yet approved or cleared by the [place] FDA and  has been authorized for detection and/or diagnosis of SARS-CoV-2 by FDA under an Emergency Use Authorization (EUA). This EUA will remain  in effect (meaning this test can be used) for the duration of the COVID-19 declaration under Section 564(b)(1) of the Act, 21 U.S.C.section 360bbb-3(b)(1), unless the authorization is terminated  or revoked sooner.       Influenza A by PCR NEGATIVE NEGATIVE Final   Influenza B by PCR NEGATIVE NEGATIVE Final    Comment: (NOTE) The Xpert Xpress SARS-CoV-2/FLU/RSV plus assay is intended as an aid in the diagnosis of influenza from Nasopharyngeal swab specimens and should not be used as a sole basis for treatment. Nasal washings and aspirates are unacceptable for Xpert Xpress SARS-CoV-2/FLU/RSV testing.  Fact Sheet for  Patients: EntrepreneurPulse.com.au  Fact Sheet for Healthcare Providers: IncredibleEmployment.be  This test is not yet approved or cleared by the [place] FDA and has been authorized for detection and/or diagnosis of SARS-CoV-2 by FDA under an Emergency Use Authorization (EUA). This EUA will remain in effect (meaning this test can be used) for the duration of the COVID-19 declaration under Section 564(b)(1) of the Act, 21 U.S.C. section 360bbb-3(b)(1), unless the authorization is terminated or revoked.  Performed at [facility], [place] [address], [place], [place] [address]     Radiology Reports DG Bone Density  Result Date: 07/07/2021 EXAM: DUAL X-RAY ABSORPTIOMETRY (DXA) FOR BONE MINERAL DENSITY IMPRESSION: Referring Physician:  [name] Your patient completed a bone mineral density test using GE Lunar iDXA system (analysis version: 16). Technologist: [place] PATIENT: Name: Teddi, Badalamenti Patient ID:  [id number] Birth Date: 1940/12/09 Height:     61.0 in. Sex:         Female Measured:   07/06/2021 Weight:     98.1 lbs. Indications: Advanced Age, Bilateral Ovariectomy (65.51), Estrogen Deficient, Hysterectomy, Postmenopausal Fractures: None Treatments: None ASSESSMENT: The BMD measured at Forearm Radius 33% is 0.590 g/cm2 with a T-score of -3.4. This patient is considered OSTEOPOROTIC according to [place] [facility]) criteria. The quality of the exam is good. The lumbar spine was excluded due to degenerative changes. Site Region Measured Date Measured Age YA BMD Significant CHANGE T-score Left Forearm Radius 33% 07/06/2021 80.4 -3.4 0.590 g/cm2 DualFemur Neck Right 07/06/2021 80.4 -2.3 0.722 g/cm2 * DualFemur Neck Right 07/03/2018 77.4 -1.4 0.845 g/cm2 DualFemur Total Mean 07/06/2021 80.4 -1.5 0.823 g/cm2 * DualFemur Total Mean 07/03/2018 77.4 -0.8 0.906 g/cm2 World Health Organization [facility]) criteria for post-menopausal, Caucasian Women:  Normal       T-score at or above -1 SD Osteopenia   T-score between -1 and -2.5 SD Osteoporosis T-score at or below -2.5 SD RECOMMENDATION: 1. All patients should optimize calcium and vitamin D intake. 2. Consider FDA-approved medical therapies in postmenopausal women and men aged 43 years and older, based on the following: a. A hip or vertebral (clinical or morphometric) fracture. b. T-score = -2.5 at the femoral neck or spine after appropriate evaluation to exclude secondary causes. c. Low bone mass (T-score between -1.0 and -2.5 at the femoral neck or spine)  and a 10-year probability of a hip fracture = 3% or a 10-year probability of a major osteoporosis-related fracture = 20% based on the [place]-adapted WHO algorithm. d. Clinician judgment and/or patient preferences may indicate treatment for people with 10-year fracture probabilities above or below these levels. FOLLOW-UP: Patients with diagnosis of osteoporosis or at high risk for fracture should have regular bone mineral density tests.? Patients eligible for Medicare are allowed routine testing every 2 years.? The testing frequency can be increased to one year for patients who have rapidly progressing disease, are receiving or discontinuing medical therapy to restore bone mass, or have additional risk factors. I have reviewed this study and agree with the findings. Mark A. Thornton Papas, M.D. [facility] Electronically Signed   By: Lavonia Dana M.D.   On: 07/07/2021 08:23   DG CHEST PORT 1 VIEW  Result Date: 07/15/2021 CLINICAL DATA:  Shortness of breath EXAM: PORTABLE CHEST 1 VIEW COMPARISON:  06/11/2021 FINDINGS: There is hyperinflation of the lungs compatible with COPD. Heart is upper limits normal in size. Aortic atherosclerosis. No confluent airspace opacities or effusions. No acute bony abnormality. IMPRESSION: COPD.  No active disease. Electronically Signed   By: Rolm Baptise M.D.   On: 07/15/2021 20:06   ECHOCARDIOGRAM LIMITED  Result Date:  07/16/2021    ECHOCARDIOGRAM LIMITED REPORT   Patient Name:   ALYSSE RATHE Date of Exam: 07/16/2021 Medical Rec #:  [id number]      Height:       61.0 in Accession #:    [id number]     Weight:       90.4 lb Date of Birth:  01-18-1941       BSA:          1.348 m Patient Age:    26 years       BP:           147/57 mmHg Patient Gender: F              HR:           68 bpm. Exam Location:  Inpatient Procedure: Limited Echo, Limited Color Doppler and Cardiac Doppler Indications:    dyspnea  History:        Patient has prior history of Echocardiogram examinations, most                 recent 06/12/2021. COPD; Risk Factors:Diabetes and Hypertension.  Sonographer:    Johny Chess RDCS Referring Phys: [id number] [place]  1. Left ventricular ejection fraction, by estimation, is 65 to 70%. The left ventricle has no regional wall motion abnormalities. There is mild to moderate left ventricular hypertrophy. Left ventricular diastolic parameters are consistent with Grade I diastolic dysfunction (impaired relaxation).  2. Right ventricular systolic function is normal. The right ventricular size is normal. There is moderately elevated pulmonary artery systolic pressure.  3. The mitral valve is normal in structure. Trivial mitral valve regurgitation. No evidence of mitral stenosis.  4. The aortic valve is normal in structure. Aortic valve regurgitation is not visualized. No aortic stenosis is present.  5. The inferior vena cava is normal in size with greater than 50% respiratory variability, suggesting right atrial pressure of 3 mmHg. FINDINGS  Left Ventricle: Left ventricular ejection fraction, by estimation, is 65 to 70%. The left ventricle has no regional wall motion abnormalities. The left ventricular internal cavity size was normal in size. There is mild left ventricular hypertrophy. Left  ventricular diastolic parameters are consistent  with Grade I diastolic dysfunction (impaired relaxation). Right  Ventricle: The right ventricular size is normal. No increase in right ventricular wall thickness. Right ventricular systolic function is normal. There is moderately elevated pulmonary artery systolic pressure. The tricuspid regurgitant velocity is 3.56 m/s, and with an assumed right atrial pressure of 3 mmHg, the estimated right ventricular systolic pressure is 56.3 mmHg. Left Atrium: Left atrial size was normal in size. Right Atrium: Right atrial size was normal in size. Pericardium: There is no evidence of pericardial effusion. Mitral Valve: The mitral valve is normal in structure. Trivial mitral valve regurgitation. No evidence of mitral valve stenosis. Tricuspid Valve: The tricuspid valve is normal in structure. Tricuspid valve regurgitation is trivial. No evidence of tricuspid stenosis. Aortic Valve: The aortic valve is normal in structure. Aortic valve regurgitation is not visualized. No aortic stenosis is present. Pulmonic Valve: The pulmonic valve was normal in structure. Pulmonic valve regurgitation is trivial. No evidence of pulmonic stenosis. Aorta: The aortic root is normal in size and structure. Venous: The inferior vena cava is normal in size with greater than 50% respiratory variability, suggesting right atrial pressure of 3 mmHg. IAS/Shunts: No atrial level shunt detected by color flow Doppler. LEFT VENTRICLE PLAX 2D LVIDd:         4.10 cm   Diastology LVIDs:         2.40 cm   LV e' medial:    8.59 cm/s LV PW:         0.80 cm   LV E/e' medial:  12.0 LV IVS:        0.90 cm   LV e' lateral:   11.90 cm/s LVOT diam:     1.70 cm   LV E/e' lateral: 8.7 LV SV:         67 LV SV Index:   49 LVOT Area:     2.27 cm  IVC IVC diam: 1.20 cm LEFT ATRIUM         Index LA diam:    3.00 cm 2.23 cm/m  AORTIC VALVE LVOT Vmax:   137.00 cm/s LVOT Vmean:  82.900 cm/s LVOT VTI:    0.293 m  AORTA Ao Asc diam: 3.20 cm MITRAL VALVE                TRICUSPID VALVE MV Area (PHT): 3.37 cm     TR Peak grad:   50.7 mmHg MV Decel  Time: 225 msec     TR Vmax:        356.00 cm/s MV E velocity: 103.00 cm/s MV A velocity: 114.00 cm/s  SHUNTS MV E/A ratio:  0.90         Systemic VTI:  0.29 m                             Systemic Diam: 1.70 cm Glori Bickers MD Electronically signed by Glori Bickers MD Signature Date/Time: 07/16/2021/4:28:36 PM    Final     Lab Data:  CBC: Recent Labs  Lab 07/15/21 2012 07/16/21 0345 07/17/21 0324 07/17/21 1841 07/18/21 0642  WBC 5.0 4.4 4.6  --  6.0  HGB 8.0* 7.7* 7.7* 9.4* 9.4*  HCT 25.5* 24.7* 24.3* 29.7* 29.3*  MCV 84.4 84.6 83.8  --  84.0  PLT 272 274 287  --  893   Basic Metabolic Panel: Recent Labs  Lab 07/15/21 2012 07/16/21 0345 07/17/21 0324 07/18/21 0642  NA 141 144 141 142  K 3.7 3.6 4.0 4.3  CL 107 111 111 113*  CO2 _0 GLUCOSE 96 81 103* 103*  BUN 85* 82* 77* 68*  CREATININE 2.47* 2.33* 2.54* 2.21*  CALCIUM 8.8* 8.8* 8.4* 9.0   GFR: Estimated Creatinine Clearance: 14 mL/min (A) (by C-G formula based on SCr of 2.21 mg/dL (H)). Liver Function Tests: Recent Labs  Lab 07/15/21 2012  AST 23  ALT 29  ALKPHOS 38  BILITOT 0.4  PROT 5.8*  ALBUMIN 2.9*   No results for input(s): LIPASE, AMYLASE in the last 168 hours. No results for input(s): AMMONIA in the last 168 hours. Coagulation Profile: No results for input(s): INR, PROTIME in the last 168 hours. Cardiac Enzymes: No results for input(s): CKTOTAL, CKMB, CKMBINDEX, TROPONINI in the last 168 hours. BNP (last 3 results) No results for input(s): PROBNP in the last 8760 hours. HbA1C: No results for input(s): HGBA1C in the last 72 hours. CBG: No results for input(s): GLUCAP in the last 168 hours. Lipid Profile: No results for input(s): CHOL, HDL, LDLCALC, TRIG, CHOLHDL, LDLDIRECT in the last 72 hours. Thyroid Function Tests: No results for input(s): TSH, T4TOTAL, FREET4, T3FREE, THYROIDAB in the last 72 hours.  Anemia Panel: No results for input(s): VITAMINB12, FOLATE, FERRITIN, TIBC,  IRON, RETICCTPCT in the last 72 hours.  Urine analysis:    Component Value Date/Time   COLORURINE YELLOW 06/12/2021 0422   APPEARANCEUR HAZY (A) 06/12/2021 0422   LABSPEC 1.009 06/12/2021 0422   PHURINE 5.0 06/12/2021 0422   GLUCOSEU NEGATIVE 06/12/2021 0422   HGBUR NEGATIVE 06/12/2021 0422   BILIRUBINUR Negative 07/13/2021 1654   KETONESUR NEGATIVE 06/12/2021 0422   PROTEINUR Positive (A) 07/13/2021 1654   PROTEINUR 100 (A) 06/12/2021 0422   UROBILINOGEN 0.2 07/13/2021 1654   NITRITE Negative 07/13/2021 1654   NITRITE NEGATIVE 06/12/2021 0422   LEUKOCYTESUR Negative 07/13/2021 1654   LEUKOCYTESUR NEGATIVE 06/12/2021 0422     Tamirra Sienkiewicz M.D. [facility] 07/19/2021, 2:09 PM  Available via Epic secure chat 7am-7pm After 7 pm, please refer to night coverage provider listed on amion.

## 2021-07-19 NOTE — Assessment & Plan Note (Signed)
Multifactorial shortness of breath, COPD as well.  With acute kidney injury, holding off further diuretic at this time.

## 2021-07-20 ENCOUNTER — Inpatient Hospital Stay (HOSPITAL_COMMUNITY): Payer: Medicare Other

## 2021-07-20 DIAGNOSIS — I50812 Chronic right heart failure: Secondary | ICD-10-CM | POA: Diagnosis not present

## 2021-07-20 DIAGNOSIS — J432 Centrilobular emphysema: Secondary | ICD-10-CM | POA: Diagnosis not present

## 2021-07-20 DIAGNOSIS — N179 Acute kidney failure, unspecified: Secondary | ICD-10-CM | POA: Diagnosis not present

## 2021-07-20 DIAGNOSIS — J410 Simple chronic bronchitis: Secondary | ICD-10-CM | POA: Diagnosis not present

## 2021-07-20 DIAGNOSIS — I5033 Acute on chronic diastolic (congestive) heart failure: Secondary | ICD-10-CM | POA: Diagnosis not present

## 2021-07-20 DIAGNOSIS — I10 Essential (primary) hypertension: Secondary | ICD-10-CM | POA: Diagnosis not present

## 2021-07-20 DIAGNOSIS — R0600 Dyspnea, unspecified: Secondary | ICD-10-CM | POA: Diagnosis not present

## 2021-07-20 LAB — BASIC METABOLIC PANEL
Anion gap: 8 (ref 5–15)
BUN: 70 mg/dL — ABNORMAL HIGH (ref 8–23)
CO2: 24 mmol/L (ref 22–32)
Calcium: 9.1 mg/dL (ref 8.9–10.3)
Chloride: 112 mmol/L — ABNORMAL HIGH (ref 98–111)
Creatinine, Ser: 2.39 mg/dL — ABNORMAL HIGH (ref 0.44–1.00)
GFR, Estimated: 20 mL/min — ABNORMAL LOW (ref >60)
Glucose, Bld: 110 mg/dL — ABNORMAL HIGH (ref 70–99)
Potassium: 5 mmol/L (ref 3.5–5.1)
Sodium: 144 mmol/L (ref 135–145)

## 2021-07-20 LAB — CBC
HCT: 26.5 % — ABNORMAL LOW (ref 36.0–46.0)
Hemoglobin: 8.4 g/dL — ABNORMAL LOW (ref 12.0–15.0)
MCH: 27.5 pg (ref 26.0–34.0)
MCHC: 31.7 g/dL (ref 30.0–36.0)
MCV: 86.9 fL (ref 80.0–100.0)
Platelets: 288 10*3/uL (ref 150–400)
RBC: 3.05 MIL/uL — ABNORMAL LOW (ref 3.87–5.11)
RDW: 24.5 % — ABNORMAL HIGH (ref 11.5–15.5)
WBC: 7 10*3/uL (ref 4.0–10.5)
nRBC: 0.7 % — ABNORMAL HIGH (ref 0.0–0.2)

## 2021-07-20 LAB — PROTEIN ELECTROPHORESIS, SERUM
A/G Ratio: 1.2 (ref 0.7–1.7)
Albumin ELP: 3.3 g/dL (ref 2.9–4.4)
Alpha-1-Globulin: 0.2 g/dL (ref 0.0–0.4)
Alpha-2-Globulin: 0.7 g/dL (ref 0.4–1.0)
Beta Globulin: 0.9 g/dL (ref 0.7–1.3)
Gamma Globulin: 0.9 g/dL (ref 0.4–1.8)
Globulin, Total: 2.7 g/dL (ref 2.2–3.9)
Total Protein ELP: 6 g/dL (ref 6.0–8.5)

## 2021-07-20 NOTE — Progress Notes (Signed)
[facility] note                                                                              Patient Demographics  Leslie Reeves, is a 80 y.o. female, DOB - [birth year], GTX:[id number]  Admit date - 07/15/2021   Admitting Physician Thayer Headings, MD  Outpatient Primary MD for the patient is [name], [place]  Outpatient specialists:   LOS - 3  days   Medical records reviewed and are as summarized below:    No chief complaint on file.      Brief summary   Patient is a 80 year old female with COPD, CVA, chronic diastolic CHF, prediabetes, CKD , hypothyroidism presented as a direct admission from cardiology office for shortness of breath suspected to be CHF exacerbation.  Patient had reported symptoms of 1 week of increasing shortness of breath, worse with exertion and better at rest.  Normally sleeps on a recliner so was unsure if she had shortness of breath if laying flat.  Also reported lower extremity edema about a week ago but improved when she elevated her feet at night.  Reported compliance with Lasix, no wheezing, coughing or fevers.  Not on home O2, denies any current tobacco use.  Reported good appetite but has lost at least 26 pounds over several months.   Patient was recently hospitalized in September for acute diastolic CHF and was started on low-dose Lasix 20 mg every other day. Initially admitted to cardiology service, [place] assumed care on 10/21  Assessment & Plan   Dyspnea with exertion, acute on chronic anemia, Symptomatic anemia -Initially thought to be from acute diastolic CHF however appeared to be somewhat volume depleted.  Also has history of COPD, acute kidney injury on CKD stage IIIb/IV, acute on chronic normocytic anemia, likely symptoms from symptomatic anemia -Lasix held, renal function improving -Baseline hemoglobin  ~11, on admission  -Anemia panel showed normal B12, folate, ferritin 17, %sat 11, Fe 32, TIBC 288,  -Status post 1 unit  packed RBC transfusion on 10/21, IV ferric gluconate  x 4 doses  -FOBT negative -SPEP, immunofixation, UPEP in process -Home O2 evaluation prior to discharge. -If hemoglobin remained stable, no acute issues, will defer further work-up of anemia to PCP, will send  ambulatory referral to hematology.  Patient also follows nephrology outpatient, may benefit from ESA  Active Problems: chronic CHF, likely right heart failure -Currently stable, not in any volume overload -Lasix was held, creatinine trended up to 2.5, (baseline 1.7-1.9) -2D echo showed EF of 65 to 70%, G1 DD  -Appreciate cardiology recommendations, per Dr Sallyanne Kuster, echo showed no evidence of LVH, left atrial enlargement or any true LV diastolic dysfunction, minimally elevated BNP when she presented with volume overload during the previous admission.  Recommended to keep Lasix as needed if she develops lower extremity edema due to right heart failure.  Acute on CKD stage IV -Baseline creatinine 1.7-1.9, creatinine was 2.28 in 05/2021, likely worsened due to diuresis and anemia -Lasix has been discontinued, creatinine currently 2.3 -[facility], Dr. Royce Macadamia.  Discussed with patient's daughter, recommended labs in 1 week  Right hip pain -  Patient had mechanical fall last night while trying to go from bed to the chair -This morning complaining of right hip pain, right hip x-ray with pelvis done shows no dislocation or fracture -Will obtain PT OT evaluation, likely will need home health PT  COPD -Patient has underlying severe COPD, does not use O2 at home -Currently stable, no wheezing  Failure to thrive -PT evaluation recommended home health PT, no OT follow-up -Continue Megace, Remeron -Patient reports she lives with her family, otherwise is functional with her ADLs, alert and oriented x3  Hypothyroidism -Continue Synthroid, TSH 1.6  Moderate to severe protein calorie malnutrition, underweight Estimated  body mass index is 18.12 kg/m as calculated from the following:   Height as of this encounter: _0  (1.549 m).   Weight as of this encounter: 43.5 kg. -Nutrition consult  Code Status: Full CODE STATUS DVT Prophylaxis:  heparin injection 5,000 Units Start: 07/15/21 2200   Level of Care: Level of care: Telemetry Cardiac Family Communication: Discussed all imaging results, lab results, explained to the patient's daughter on phone in detail.  Plan for DC home with home health PT OT tomorrow if no acute issues and patient is able to ambulate.   Disposition Plan:     Status is: Inpatient,  Time Spent in minutes 25 minutes  Procedures:  2D echo   Antimicrobials:   Anti-infectives (From admission, onward)    None          Medications  Scheduled Meds:  amLODipine  10 mg Oral Daily   feeding supplement  237 mL Oral BID BM   heparin  5,000 Units Subcutaneous Q8H   isosorbide-hydrALAZINE  1 tablet Oral TID   levothyroxine  37.5 mcg Oral Q0600   megestrol  600 mg Oral Daily   mirtazapine  15 mg Oral QHS   mometasone-formoterol  2 puff Inhalation BID   multivitamin with minerals  1 tablet Oral Daily   rosuvastatin  10 mg Oral Daily   Continuous Infusions:  ferric gluconate (FERRLECIT) IVPB 250 mg (07/19/21 1604)   PRN Meds:.acetaminophen, albuterol, nitroGLYCERIN, ondansetron (ZOFRAN) IV      Subjective:   Leslie Reeves was seen and examined today.  Overnight had a mechanical fall, complaining of right hip pain today.  No chest pain or shortness of breath, no lower leg swelling.    Objective:   Vitals:   07/19/21 2129 07/20/21 0324 07/20/21 0925 07/20/21 1155  BP:  (!) 147/54 (!) 126/54 (!) 125/53  Pulse:  69 71 70  Resp:  _1 Temp:  98.6 F (37 C) 99.4 F (37.4 C) 98.5 F (36.9 C)  TempSrc:  Oral Oral Oral  SpO2: 98% 91% 95% 95%  Weight:  43.5 kg    Height:        Intake/Output Summary (Last 24 hours) at 07/20/2021 1452 Last data filed at  07/20/2021 1248 Gross per 24 hour  Intake 360 ml  Output 300 ml  Net 60 ml     Wt Readings from Last 3 Encounters:  07/20/21 43.5 kg  07/15/21 41.4 kg  07/13/21 41.1 kg   Physical Exam General: Alert and oriented x 3, NAD Cardiovascular: S1 S2 clear, RRR. No pedal edema b/l Respiratory: CTAB, no wheezing, rales or rhonchi Gastrointestinal: Soft, nontender, nondistended, NBS Ext: no pedal edema bilaterally Musculoskeletal: Able to bend knee on the right leg, pain on lifting up.  Ice pack to the right hip.  Range of movement normal in the left lower  leg   Data Reviewed:  I have personally reviewed following labs and imaging studies  Micro Results Recent Results (from the past 240 hour(s))  Resp Panel by RT-PCR (Flu A&B, Covid) Nasopharyngeal Swab     Status: None   Collection Time: 07/15/21  6:23 PM   Specimen: Nasopharyngeal Swab; Nasopharyngeal(NP) swabs in vial transport medium  Result Value Ref Range Status   SARS Coronavirus 2 by RT PCR NEGATIVE NEGATIVE Final    Comment: (NOTE) SARS-CoV-2 target nucleic acids are NOT DETECTED.  The SARS-CoV-2 RNA is generally detectable in upper respiratory specimens during the acute phase of infection. The lowest concentration of SARS-CoV-2 viral copies this assay can detect is 138 copies/mL. A negative result does not preclude SARS-Cov-2 infection and should not be used as the sole basis for treatment or other patient management decisions. A negative result may occur with  improper specimen collection/handling, submission of specimen other than nasopharyngeal swab, presence of viral mutation(s) within the areas targeted by this assay, and inadequate number of viral copies(<138 copies/mL). A negative result must be combined with clinical observations, patient history, and epidemiological information. The expected result is Negative.  Fact Sheet for Patients:  EntrepreneurPulse.com.au  Fact Sheet for Healthcare  Providers:  IncredibleEmployment.be  This test is no t yet approved or cleared by the [place] FDA and  has been authorized for detection and/or diagnosis of SARS-CoV-2 by FDA under an Emergency Use Authorization (EUA). This EUA will remain  in effect (meaning this test can be used) for the duration of the COVID-19 declaration under Section 564(b)(1) of the Act, 21 U.S.C.section 360bbb-3(b)(1), unless the authorization is terminated  or revoked sooner.       Influenza A by PCR NEGATIVE NEGATIVE Final   Influenza B by PCR NEGATIVE NEGATIVE Final    Comment: (NOTE) The Xpert Xpress SARS-CoV-2/FLU/RSV plus assay is intended as an aid in the diagnosis of influenza from Nasopharyngeal swab specimens and should not be used as a sole basis for treatment. Nasal washings and aspirates are unacceptable for Xpert Xpress SARS-CoV-2/FLU/RSV testing.  Fact Sheet for Patients: EntrepreneurPulse.com.au  Fact Sheet for Healthcare Providers: IncredibleEmployment.be  This test is not yet approved or cleared by the [place] FDA and has been authorized for detection and/or diagnosis of SARS-CoV-2 by FDA under an Emergency Use Authorization (EUA). This EUA will remain in effect (meaning this test can be used) for the duration of the COVID-19 declaration under Section 564(b)(1) of the Act, 21 U.S.C. section 360bbb-3(b)(1), unless the authorization is terminated or revoked.  Performed at [facility], [place] [address], [place], [place] [address]     Radiology Reports DG Bone Density  Result Date: 07/07/2021 EXAM: DUAL X-RAY ABSORPTIOMETRY (DXA) FOR BONE MINERAL DENSITY IMPRESSION: Referring Physician:  [name] Your patient completed a bone mineral density test using GE Lunar iDXA system (analysis version: 16). Technologist: Blanket PATIENT: Name: Latica, Hohmann Patient ID:  [id number] Birth Date: [birth year] Height:     61.0 in.  Sex:         Female Measured:   07/06/2021 Weight:     98.1 lbs. Indications: Advanced Age, Bilateral Ovariectomy (65.51), Estrogen Deficient, Hysterectomy, Postmenopausal Fractures: None Treatments: None ASSESSMENT: The BMD measured at Forearm Radius 33% is 0.590 g/cm2 with a T-score of -3.4. This patient is considered OSTEOPOROTIC according to [facility]) criteria. The quality of the exam is good. The lumbar spine was excluded due to degenerative changes. Site Region Measured Date Measured  Age YA BMD Significant CHANGE T-score Left Forearm Radius 33% 07/06/2021 80.4 -3.4 0.590 g/cm2 DualFemur Neck Right 07/06/2021 80.4 -2.3 0.722 g/cm2 * DualFemur Neck Right 07/03/2018 77.4 -1.4 0.845 g/cm2 DualFemur Total Mean 07/06/2021 80.4 -1.5 0.823 g/cm2 * DualFemur Total Mean 07/03/2018 77.4 -0.8 0.906 g/cm2 World Health Organization [facility]) criteria for post-menopausal, Caucasian Women: Normal       T-score at or above -1 SD Osteopenia   T-score between -1 and -2.5 SD Osteoporosis T-score at or below -2.5 SD RECOMMENDATION: 1. All patients should optimize calcium and vitamin D intake. 2. Consider FDA-approved medical therapies in postmenopausal women and men aged 77 years and older, based on the following: a. A hip or vertebral (clinical or morphometric) fracture. b. T-score = -2.5 at the femoral neck or spine after appropriate evaluation to exclude secondary causes. c. Low bone mass (T-score between -1.0 and -2.5 at the femoral neck or spine) and a 10-year probability of a hip fracture = 3% or a 10-year probability of a major osteoporosis-related fracture = 20% based on the [place]-adapted WHO algorithm. d. Clinician judgment and/or patient preferences may indicate treatment for people with 10-year fracture probabilities above or below these levels. FOLLOW-UP: Patients with diagnosis of osteoporosis or at high risk for fracture should have regular bone mineral density tests.? Patients eligible for Medicare  are allowed routine testing every 2 years.? The testing frequency can be increased to one year for patients who have rapidly progressing disease, are receiving or discontinuing medical therapy to restore bone mass, or have additional risk factors. I have reviewed this study and agree with the findings. Mark A. Thornton Papas, M.D. [facility] Electronically Signed   By: Lavonia Dana M.D.   On: 07/07/2021 08:23   DG CHEST PORT 1 VIEW  Result Date: 07/15/2021 CLINICAL DATA:  Shortness of breath EXAM: PORTABLE CHEST 1 VIEW COMPARISON:  06/11/2021 FINDINGS: There is hyperinflation of the lungs compatible with COPD. Heart is upper limits normal in size. Aortic atherosclerosis. No confluent airspace opacities or effusions. No acute bony abnormality. IMPRESSION: COPD.  No active disease. Electronically Signed   By: Rolm Baptise M.D.   On: 07/15/2021 20:06   DG HIP UNILAT WITH PELVIS 2-3 VIEWS RIGHT  Result Date: 07/20/2021 CLINICAL DATA:  Right hip pain after fall yesterday. EXAM: DG HIP (WITH OR WITHOUT PELVIS) 2-3V RIGHT COMPARISON:  None. FINDINGS: There is no evidence of hip fracture or dislocation. There is no evidence of arthropathy or other focal bone abnormality. IMPRESSION: Negative. Electronically Signed   By: Marijo Conception M.D.   On: 07/20/2021 10:33   ECHOCARDIOGRAM LIMITED  Result Date: 07/16/2021    ECHOCARDIOGRAM LIMITED REPORT   Patient Name:   DEMRI POULTON Date of Exam: 07/16/2021 Medical Rec #:  [id number]      Height:       61.0 in Accession #:    [id number]     Weight:       90.4 lb Date of Birth:  [birth year]       BSA:          1.348 m Patient Age:    89 years       BP:           147/57 mmHg Patient Gender: F              HR:           68 bpm. Exam Location:  Inpatient Procedure: Limited Echo, Limited Color Doppler and Cardiac  Doppler Indications:    dyspnea  History:        Patient has prior history of Echocardiogram examinations, most                 recent 06/12/2021. COPD; Risk  Factors:Diabetes and Hypertension.  Sonographer:    Johny Chess RDCS Referring Phys: [id number] [place]  1. Left ventricular ejection fraction, by estimation, is 65 to 70%. The left ventricle has no regional wall motion abnormalities. There is mild to moderate left ventricular hypertrophy. Left ventricular diastolic parameters are consistent with Grade I diastolic dysfunction (impaired relaxation).  2. Right ventricular systolic function is normal. The right ventricular size is normal. There is moderately elevated pulmonary artery systolic pressure.  3. The mitral valve is normal in structure. Trivial mitral valve regurgitation. No evidence of mitral stenosis.  4. The aortic valve is normal in structure. Aortic valve regurgitation is not visualized. No aortic stenosis is present.  5. The inferior vena cava is normal in size with greater than 50% respiratory variability, suggesting right atrial pressure of 3 mmHg. FINDINGS  Left Ventricle: Left ventricular ejection fraction, by estimation, is 65 to 70%. The left ventricle has no regional wall motion abnormalities. The left ventricular internal cavity size was normal in size. There is mild left ventricular hypertrophy. Left  ventricular diastolic parameters are consistent with Grade I diastolic dysfunction (impaired relaxation). Right Ventricle: The right ventricular size is normal. No increase in right ventricular wall thickness. Right ventricular systolic function is normal. There is moderately elevated pulmonary artery systolic pressure. The tricuspid regurgitant velocity is 3.56 m/s, and with an assumed right atrial pressure of 3 mmHg, the estimated right ventricular systolic pressure is 45.4 mmHg. Left Atrium: Left atrial size was normal in size. Right Atrium: Right atrial size was normal in size. Pericardium: There is no evidence of pericardial effusion. Mitral Valve: The mitral valve is normal in structure. Trivial mitral valve  regurgitation. No evidence of mitral valve stenosis. Tricuspid Valve: The tricuspid valve is normal in structure. Tricuspid valve regurgitation is trivial. No evidence of tricuspid stenosis. Aortic Valve: The aortic valve is normal in structure. Aortic valve regurgitation is not visualized. No aortic stenosis is present. Pulmonic Valve: The pulmonic valve was normal in structure. Pulmonic valve regurgitation is trivial. No evidence of pulmonic stenosis. Aorta: The aortic root is normal in size and structure. Venous: The inferior vena cava is normal in size with greater than 50% respiratory variability, suggesting right atrial pressure of 3 mmHg. IAS/Shunts: No atrial level shunt detected by color flow Doppler. LEFT VENTRICLE PLAX 2D LVIDd:         4.10 cm   Diastology LVIDs:         2.40 cm   LV e' medial:    8.59 cm/s LV PW:         0.80 cm   LV E/e' medial:  12.0 LV IVS:        0.90 cm   LV e' lateral:   11.90 cm/s LVOT diam:     1.70 cm   LV E/e' lateral: 8.7 LV SV:         67 LV SV Index:   49 LVOT Area:     2.27 cm  IVC IVC diam: 1.20 cm LEFT ATRIUM         Index LA diam:    3.00 cm 2.23 cm/m  AORTIC VALVE LVOT Vmax:   137.00 cm/s LVOT Vmean:  82.900 cm/s LVOT VTI:  0.293 m  AORTA Ao Asc diam: 3.20 cm MITRAL VALVE                TRICUSPID VALVE MV Area (PHT): 3.37 cm     TR Peak grad:   50.7 mmHg MV Decel Time: 225 msec     TR Vmax:        356.00 cm/s MV E velocity: 103.00 cm/s MV A velocity: 114.00 cm/s  SHUNTS MV E/A ratio:  0.90         Systemic VTI:  0.29 m                             Systemic Diam: 1.70 cm Glori Bickers MD Electronically signed by Glori Bickers MD Signature Date/Time: 07/16/2021/4:28:36 PM    Final     Lab Data:  CBC: Recent Labs  Lab 07/15/21 2012 07/16/21 0345 07/17/21 0324 07/17/21 1841 07/18/21 0642 07/20/21 0436  WBC 5.0 4.4 4.6  --  6.0 7.0  HGB 8.0* 7.7* 7.7* 9.4* 9.4* 8.4*  HCT 25.5* 24.7* 24.3* 29.7* 29.3* 26.5*  MCV 84.4 84.6 83.8  --  84.0 86.9  PLT  272 274 287  --  284 179   Basic Metabolic Panel: Recent Labs  Lab 07/15/21 2012 07/16/21 0345 07/17/21 0324 07/18/21 0642 07/20/21 0436  NA 141 144 141 142 144  K 3.7 3.6 4.0 4.3 5.0  CL 107 111 111 113* 112*  CO2 _0 GLUCOSE 96 81 103* 103* 110*  BUN 85* 82* 77* 68* 70*  CREATININE 2.47* 2.33* 2.54* 2.21* 2.39*  CALCIUM 8.8* 8.8* 8.4* 9.0 9.1   GFR: Estimated Creatinine Clearance: 12.9 mL/min (A) (by C-G formula based on SCr of 2.39 mg/dL (H)). Liver Function Tests: Recent Labs  Lab 07/15/21 2012  AST 23  ALT 29  ALKPHOS 38  BILITOT 0.4  PROT 5.8*  ALBUMIN 2.9*   No results for input(s): LIPASE, AMYLASE in the last 168 hours. No results for input(s): AMMONIA in the last 168 hours. Coagulation Profile: No results for input(s): INR, PROTIME in the last 168 hours. Cardiac Enzymes: No results for input(s): CKTOTAL, CKMB, CKMBINDEX, TROPONINI in the last 168 hours. BNP (last 3 results) No results for input(s): PROBNP in the last 8760 hours. HbA1C: No results for input(s): HGBA1C in the last 72 hours. CBG: No results for input(s): GLUCAP in the last 168 hours. Lipid Profile: No results for input(s): CHOL, HDL, LDLCALC, TRIG, CHOLHDL, LDLDIRECT in the last 72 hours. Thyroid Function Tests: No results for input(s): TSH, T4TOTAL, FREET4, T3FREE, THYROIDAB in the last 72 hours.  Anemia Panel: No results for input(s): VITAMINB12, FOLATE, FERRITIN, TIBC, IRON, RETICCTPCT in the last 72 hours.  Urine analysis:    Component Value Date/Time   COLORURINE YELLOW 07/17/2021 [place] 07/17/2021 1434   LABSPEC 1.014 07/17/2021 1434   PHURINE 5.0 07/17/2021 1434   GLUCOSEU NEGATIVE 07/17/2021 1434   [place] 07/17/2021 [place] 07/17/2021 1434   BILIRUBINUR Negative 07/13/2021 [place] 07/17/2021 1434   PROTEINUR 100 (A) 07/17/2021 1434   UROBILINOGEN 0.2 07/13/2021 1654   NITRITE NEGATIVE 07/17/2021  1434   LEUKOCYTESUR NEGATIVE 07/17/2021 1434     Kristy Catoe M.D. [facility] 07/20/2021, 2:52 PM  Available via Epic secure chat 7am-7pm After 7 pm, please refer to night coverage provider listed on amion.

## 2021-07-20 NOTE — Progress Notes (Signed)
Progress Note  Patient Name: Leslie Reeves Date of Encounter: 07/20/2021  [facility], [place] [facility] Cardiologist: [name], MD   Subjective   Looks very comfortable at rest.  Spent some time in chair.  Did not walk yet.  Denies dyspnea.  Inpatient Medications    Scheduled Meds:  amLODipine  10 mg Oral Daily   feeding supplement  237 mL Oral BID BM   heparin  5,000 Units Subcutaneous Q8H   isosorbide-hydrALAZINE  1 tablet Oral TID   levothyroxine  37.5 mcg Oral Q0600   megestrol  600 mg Oral Daily   mirtazapine  15 mg Oral QHS   mometasone-formoterol  2 puff Inhalation BID   multivitamin with minerals  1 tablet Oral Daily   rosuvastatin  10 mg Oral Daily   Continuous Infusions:  ferric gluconate (FERRLECIT) IVPB 250 mg (07/19/21 1604)   PRN Meds: acetaminophen, albuterol, nitroGLYCERIN, ondansetron (ZOFRAN) IV   Vital Signs    Vitals:   07/19/21 1903 07/19/21 1929 07/19/21 2129 07/20/21 0324  BP: (!) 154/73 (!) 141/63  (!) 147/54  Pulse: 79 74  69  Resp: _0 Temp: 97.8 F (36.6 C) 98.3 F (36.8 C)  98.6 F (37 C)  TempSrc: Oral Oral  Oral  SpO2: 99% 100% 98% 91%  Weight:    43.5 kg  Height:        Intake/Output Summary (Last 24 hours) at 07/20/2021 0919 Last data filed at 07/20/2021 0329 Gross per 24 hour  Intake 240 ml  Output 700 ml  Net -460 ml   Last 3 Weights 07/20/2021 07/19/2021 07/18/2021  Weight (lbs) 95 lb 14.4 oz 96 lb 1.6 oz 93 lb 6.4 oz  Weight (kg) 43.5 kg 43.591 kg 42.366 kg      Telemetry    Sinus rhythm- Personally Reviewed  ECG    Normal sinus rhythm, normal tracing- Personally Reviewed  Physical Exam  Cachectic GEN: No acute distress.   Neck: 6-7 cm JVD with prominent respiratory variation Cardiac: RRR, no murmurs, rubs, or gallops.  Respiratory: Bilateral rhonchi, rare scattered wheezes, no signs of consolidation. GI: Soft, nontender, non-distended  MS: No edema; No deformity. Neuro:  Nonfocal  Psych: Normal affect    Labs    High Sensitivity Troponin:  No results for input(s): TROPONINIHS in the last 720 hours.   Chemistry Recent Labs  Lab 07/15/21 2012 07/16/21 0345 07/17/21 0324 07/18/21 0642 07/20/21 0436  NA 141   < > 141 142 144  K 3.7   < > 4.0 4.3 5.0  CL 107   < > 111 113* 112*  CO2 24   < > _1 GLUCOSE 96   < > 103* 103* 110*  BUN 85*   < > 77* 68* 70*  CREATININE 2.47*   < > 2.54* 2.21* 2.39*  CALCIUM 8.8*   < > 8.4* 9.0 9.1  PROT 5.8*  --   --   --   --   ALBUMIN 2.9*  --   --   --   --   AST 23  --   --   --   --   ALT 29  --   --   --   --   ALKPHOS 38  --   --   --   --   BILITOT 0.4  --   --   --   --   GFRNONAA 19*   < > 19* 22* 20*  ANIONGAP 10   < > _0 < > = values in this interval not displayed.    Lipids No results for input(s): CHOL, TRIG, HDL, LABVLDL, LDLCALC, CHOLHDL in the last 168 hours.  Hematology Recent Labs  Lab 07/17/21 0324 07/17/21 1841 07/18/21 0642 07/20/21 0436  WBC 4.6  --  6.0 7.0  RBC 2.90*  --  3.49* 3.05*  HGB 7.7* 9.4* 9.4* 8.4*  HCT 24.3* 29.7* 29.3* 26.5*  MCV 83.8  --  84.0 86.9  MCH 26.6  --  26.9 27.5  MCHC 31.7  --  32.1 31.7  RDW 24.7*  --  23.0* 24.5*  PLT 287  --  284 288   Thyroid  Recent Labs  Lab 07/15/21 2012  TSH 1.690    BNP Recent Labs  Lab 07/13/21 1040  BNP 50.9    DDimer No results for input(s): DDIMER in the last 168 hours.   Radiology    No results found.  Cardiac Studies   I have reviewed both her echocardiograms from 06/12/2021 and 07/16/2021.  They both showed normal left ventricular systolic function and normal valves.  I disagree with the diagnosis of diastolic dysfunction.  The mitral annulus diastolic velocities are completely normal for an 80 year old.  She does not have left atrial enlargement or left ventricular hypertrophy (despite what the last report said).  The inferior vena cava was not dilated.  She does indeed have mild Neri artery hypertension, which can be attributed  to her chronic lung disease.  Patient Profile     80 y.o. female with COPD, pulmonary artery hypertension and cor pulmonale.   Assessment & Plan    CHF: I do not think that Mrs. Miranda has diastolic left heart failure. She appears to have exclusively right heart failure. The echocardiograms show no evidence of left ventricular hypertrophy, left atrial enlargement or any true left ventricular diastolic dysfunction.  For an 80 year old, her mitral annulus medial and lateral e' velocities are completely within normal range, as are all the parameters on the mitral inflow.  There has never been any convincing evidence for elevated left atrial pressure (I.e. left heart failure).  Her BNP was minimally elevated when she presented with volume overload in September (BNP 126 could actually be normal for an octogenarian).  Her BNP in April and in October of this year are completely normal, especially when considering her age.  She has developed acute kidney injury with diuretic therapy. In the future, it's possible that she will develop lower extremity edema due to right heart failure, due to chronic lung disease and cor pulmonale. Diuretics should be used only as needed,   cautiously for symptom relief, when she has discomfort from excessive lower extremity edema.  She weighs 43.5 kg (96 pounds) today and has no overt evidence of heart failure or peripheral edema.  I doubt the accuracy of the "dry weight" of 80 pounds that is reported in the chart.  At time of discharge during her last hospitalization in September she weighed 44.5 kg (98 pounds) I suspect this is a better approximation of her "dry weight".  2. Coronary/aortic atherosclerosis: She does have evidence of aortic and coronary atherosclerotic calcification on chest CT, but does not have angina pectoris or symptoms of PAD.  Her ECG is normal as is her left ventricular regional wall motion.  The focus should be on risk factor modification (smoking  cessation, keeping LDL less than 70, etc.).  3. HTN: Well-controlled on current  regimen   4. COPD: Clearly her most serious problem and likely the reason for her "failure to thrive".  It is also the cause for her PAH and cor pulmonale.   [facility] will sign off.   Medication Recommendations: Continue to hold diuretics.  Would not use diuretics unless her renal function has returned to baseline and she weighs over 100 pounds, with significant discomfort from lower extremity edema. Other recommendations (labs, testing, etc): Follow renal function parameters periodically. Follow up as an outpatient: as previously scheduled.  For questions or updates, please contact [place] Please consult [email or web address] for contact info under        Signed, Sanda Klein, MD  07/20/2021, 9:19 AM

## 2021-07-20 NOTE — Progress Notes (Signed)
Physical Therapy Treatment Patient Details Name: Leslie Reeves MRN: [id number] DOB: 18-May-1941 Today's Date: 07/20/2021   History of Present Illness 80 year old female with COPD, CVA, chronic diastolic CHF, prediabetes, CKD , hypothyroidism presented as a direct admission from cardiology office for shortness of breath suspected to be CHF exacerbation but pt found to have symptomatic anemia with Hgb of 7.7. Pt with fall in hospital 10/23 and c/o rt hip pain on 10/24. Imaging of rt hip negative.    PT Comments    Pt with a decrease in mobility compared to eval on 10/22 due to rt hip pain after fall. Pt is able to ambulate with walker and assist household distances. Daughter present for part of treatment and confirms she will be at home and able to assist pt.    Recommendations for follow up therapy are one component of a multi-disciplinary discharge planning process, led by the attending physician.  Recommendations may be updated based on patient status, additional functional criteria and insurance authorization.  Follow Up Recommendations  Home health PT     Assistance Recommended at Discharge Frequent or constant Supervision/Assistance  Equipment Recommendations  None recommended by PT    Recommendations for Other Services       Precautions / Restrictions Precautions Precautions: Fall Precaution Comments: monitor O2, fall during this admission     Mobility  Bed Mobility Overal bed mobility: Needs Assistance Bed Mobility: Supine to Sit     Supine to sit: Supervision;HOB elevated     General bed mobility comments: Inct time and effort and supervision for safety    Transfers   Equipment used: Rolling walker (2 wheels) Transfers: Sit to/from Stand Sit to Stand: Min assist           General transfer comment: Assist to bring hips up. Incr time and effort due to rt hip pain    Ambulation/Gait Ambulation/Gait assistance: Min assist Gait Distance (Feet): 15 Feet (x  2) Assistive device: Rolling walker (2 wheels) Gait Pattern/deviations: Step-to pattern;Decreased step length - left;Decreased stance time - right;Antalgic Gait velocity: decr Gait velocity interpretation: <1.31 ft/sec, indicative of household ambulator General Gait Details: Assist for support and balance. Pt with heavy reliance on walker to decr weight on RLE due to pain. Pt incontinent of urine which limited ambulation distance as well as hip pain   Stairs             Wheelchair Mobility    Modified Rankin (Stroke Patients Only)       Balance Overall balance assessment: Needs assistance;History of Falls Sitting-balance support: Feet supported;No upper extremity supported Sitting balance-Leahy Scale: Fair     Standing balance support: Bilateral upper extremity supported Standing balance-Leahy Scale: Poor Standing balance comment: walker and min guard for static standing                            Cognition Arousal/Alertness: Awake/alert Behavior During Therapy: WFL for tasks assessed/performed Overall Cognitive Status: History of cognitive impairments - at baseline                                 General Comments: generalized ST Memory deficit.  Decreased complex thought and problem solving noted. able to recall fall overnight        Exercises      General Comments General comments (skin integrity, edema, etc.): SpO2 >96% on RA at rest and  with activity. Dyspnea 2/4 with activity      Pertinent Vitals/Pain Pain Assessment: Faces Faces Pain Scale: Hurts even more Pain Location: R hip/LE in standing Pain Descriptors / Indicators: Grimacing;Guarding;Sore Pain Intervention(s): Limited activity within patient's tolerance;Monitored during session;Repositioned    Home Living                          Prior Function            PT Goals (current goals can now be found in the care plan section) Acute Rehab PT Goals Patient  Stated Goal: Go home Progress towards PT goals: Not progressing toward goals - comment (due to new hip pain after recent fall)    Frequency    Min 3X/week      PT Plan Current plan remains appropriate    Co-evaluation              AM-PAC PT "6 Clicks" Mobility   Outcome Measure  Help needed turning from your back to your side while in a flat bed without using bedrails?: None Help needed moving from lying on your back to sitting on the side of a flat bed without using bedrails?: A Little Help needed moving to and from a bed to a chair (including a wheelchair)?: A Little Help needed standing up from a chair using your arms (e.g., wheelchair or bedside chair)?: A Little Help needed to walk in hospital room?: A Little Help needed climbing 3-5 steps with a railing? : A Lot 6 Click Score: 18    End of Session Equipment Utilized During Treatment: Gait belt Activity Tolerance: Patient limited by pain Patient left: in chair;with call bell/phone within reach;with chair alarm set Nurse Communication: Mobility status PT Visit Diagnosis: Unsteadiness on feet (R26.81);Muscle weakness (generalized) (M62.81)     Time: 3507-5732 PT Time Calculation (min) (ACUTE ONLY): 31 min  Charges:  $Gait Training: 23-37 mins                     [place] Pager [phone] Office [place] 07/20/2021, 5:32 PM

## 2021-07-20 NOTE — Care Management Important Message (Signed)
Important Message  Patient Details  Name: Leslie Reeves MRN: [id number] Date of Birth: 09/23/1941   Medicare Important Message Given:  Yes     Shelda Altes 07/20/2021, 10:22 AM

## 2021-07-20 NOTE — Progress Notes (Signed)
Occupational Therapy Treatment Patient Details Name: Leslie Reeves MRN: [id number] DOB: 15-Feb-1941 Today's Date: 07/20/2021   History of present illness 80 year old female with COPD, CVA, chronic diastolic CHF, prediabetes, CKD , hypothyroidism presented as a direct admission from cardiology office for shortness of breath suspected to be CHF exacerbation but pt found to have symptomatic anemia with Hgb of 7.7   OT comments  Pt noted with fall yesterday evening and increased discomfort with RLE during activity. Pt requesting toileting assist, so opted for [facility] transfer due to difficulty with standing. Pt able to complete transfer and toileting task with min guard. Assisted pt back to bed and ice applied to R hip. Collab with RN who reports pt denied pain after fall yesterday, but now reporting pain this AM. Per RN, plan to consult MD for imaging. Based on difficulty with tasks today, updated DC recs to include HHOT at this time. Will continue to follow acutely.   SpO2 93-95% on 2 L O2, HR 70s    Recommendations for follow up therapy are one component of a multi-disciplinary discharge planning process, led by the attending physician.  Recommendations may be updated based on patient status, additional functional criteria and insurance authorization.    Follow Up Recommendations  Home health OT    Assistance Recommended at Discharge Intermittent Supervision/Assistance  Equipment Recommendations  Tub/shower bench    Recommendations for Other Services      Precautions / Restrictions Precautions Precautions: Fall Precaution Comments: monitor O2, fall during this admission Restrictions Weight Bearing Restrictions: No       Mobility Bed Mobility Overal bed mobility: Needs Assistance Bed Mobility: Supine to Sit;Sit to Supine     Supine to sit: Modified independent (Device/Increase time);HOB elevated Sit to supine: Min assist   General bed mobility comments: to get R LE back into bed     Transfers Overall transfer level: Needs assistance Equipment used: 1 person hand held assist Transfers: Sit to/from Stand Sit to Stand: Min guard Stand pivot transfers: Min guard         General transfer comment: min guard for sit to stand transfer with handheld assist (wanted to trial with RW as pt reports not using at home). Min guard for pivot to [facility] with attempt to offload sore R LE     Balance Overall balance assessment: Needs assistance;History of Falls Sitting-balance support: Feet supported Sitting balance-Leahy Scale: Fair     Standing balance support: Single extremity supported;During functional activity Standing balance-Leahy Scale: Poor Standing balance comment: reliant on UE support in standing due to RLE discomfort                           ADL either performed or assessed with clinical judgement   ADL Overall ADL's : Needs assistance/impaired                         Toilet Transfer: Min Statistician Details (indicate cue type and reason): min guard for safety, noted limping due to R LE pain. Use of BSC due to inability to mobilize and noted urine collection/measuring Toileting- Clothing Manipulation and Hygiene: Min guard;Sitting/lateral lean Toileting - Clothing Manipulation Details (indicate cue type and reason): able to perform peri care and manage clothing, min guard for safety       General ADL Comments: Pt with fall overnight and R hip/LE soreness with inability to mobilize as she was previously on OT eval.  Vision   Vision Assessment?: No apparent visual deficits   Perception     Praxis      Cognition Arousal/Alertness: Awake/alert Behavior During Therapy: WFL for tasks assessed/performed Overall Cognitive Status: History of cognitive impairments - at baseline                                 General Comments: generalized ST Memory deficit.  Decreased complex thought and problem  solving noted. able to recall fall overnight          Exercises     Shoulder Instructions       General Comments collab with pt/RN on events surrounding fall (pt reports attempting to get up to bathroom). Per RN, pt without pain after fall and no imaging completed. Pt with reports of pain this AM, RN messaged MD to inquire about imaging. Limited activity during session, applied ice at end of session    Pertinent Vitals/ Pain       Pain Assessment: 0-10 Pain Score: 7  Pain Location: R hip/LE in standing Pain Descriptors / Indicators: Grimacing;Guarding;Sore Pain Intervention(s): Monitored during session;Limited activity within patient's tolerance;Ice applied;Other (comment) (consulted RN)  Home Living                                          Prior Functioning/Environment              Frequency  Min 2X/week        Progress Toward Goals  OT Goals(current goals can now be found in the care plan section)  Progress towards OT goals: Progressing toward goals  Acute Rehab OT Goals OT Goal Formulation: With patient Time For Goal Achievement: 07/31/21 Potential to Achieve Goals: Good ADL Goals Pt Will Perform Grooming: with supervision;standing Pt Will Perform Upper Body Bathing: with supervision;sitting Pt Will Perform Upper Body Dressing: with supervision;sitting Pt Will Transfer to Toilet: with supervision;ambulating;regular height toilet Pt Will Perform Toileting - Clothing Manipulation and hygiene: with supervision;sit to/from stand  Plan Discharge plan needs to be updated    Co-evaluation                 AM-PAC OT "6 Clicks" Daily Activity     Outcome Measure   Help from another person eating meals?: None Help from another person taking care of personal grooming?: A Little Help from another person toileting, which includes using toliet, bedpan, or urinal?: A Little Help from another person bathing (including washing, rinsing,  drying)?: A Lot Help from another person to put on and taking off regular upper body clothing?: A Little Help from another person to put on and taking off regular lower body clothing?: A Lot 6 Click Score: 17    End of Session Equipment Utilized During Treatment: Gait belt  OT Visit Diagnosis: Unsteadiness on feet (R26.81);Muscle weakness (generalized) (M62.81)   Activity Tolerance Patient limited by pain   Patient Left in bed;with call bell/phone within reach;with bed alarm set   Nurse Communication Mobility status;Other (comment) (fall yesterday, reports of new pain)        Time: 8119-1478 OT Time Calculation (min): 16 min  Charges: OT General Charges $OT Visit: 1 Visit OT Treatments $Self Care/Home Management : 8-22 mins  Leslie Reeves, OTR/L Acute Rehab Services Office: [phone]   Leslie Reeves 07/20/2021, 8:22 AM

## 2021-07-21 ENCOUNTER — Inpatient Hospital Stay (HOSPITAL_COMMUNITY): Payer: Medicare Other

## 2021-07-21 DIAGNOSIS — I10 Essential (primary) hypertension: Secondary | ICD-10-CM | POA: Diagnosis not present

## 2021-07-21 DIAGNOSIS — N179 Acute kidney failure, unspecified: Secondary | ICD-10-CM | POA: Diagnosis not present

## 2021-07-21 DIAGNOSIS — I5033 Acute on chronic diastolic (congestive) heart failure: Secondary | ICD-10-CM | POA: Diagnosis not present

## 2021-07-21 LAB — IMMUNOFIXATION ELECTROPHORESIS
IgA: 219 mg/dL (ref 64–422)
IgG (Immunoglobin G), Serum: 1119 mg/dL (ref 586–1602)
IgM (Immunoglobulin M), Srm: 37 mg/dL (ref 26–217)
Total Protein ELP: 5.9 g/dL — ABNORMAL LOW (ref 6.0–8.5)

## 2021-07-21 MED ORDER — ORAL CARE MOUTH RINSE
15.0000 mL | Freq: Two times a day (BID) | OROMUCOSAL | Status: DC
  Administered 2021-07-21 – 2021-07-24 (×7): 15 mL via OROMUCOSAL

## 2021-07-21 MED ORDER — OXYCODONE HCL 5 MG PO TABS
2.5000 mg | ORAL_TABLET | Freq: Once | ORAL | Status: AC | PRN
  Administered 2021-07-21: 5 mg via ORAL
  Filled 2021-07-21: qty 1

## 2021-07-21 NOTE — Progress Notes (Signed)
Patient requests stronger pain meds for right hip after tylenol, repositioning, and heat hasn't helped. Provider notified.

## 2021-07-21 NOTE — Progress Notes (Signed)
[facility] note                                                                              Patient Demographics  Leslie Reeves, is a 80 y.o. female, DOB - 04/11/1941, GDJ:[id number]  Admit date - 07/15/2021   Admitting Physician Thayer Headings, MD  Outpatient Primary MD for the patient is [name], [place]  Outpatient specialists:   LOS - 4  days   Medical records reviewed and are as summarized below:    No chief complaint on file.      Brief summary   Patient is a 80 year old female with COPD, CVA, chronic diastolic CHF, prediabetes, CKD , hypothyroidism presented as a direct admission from cardiology office for shortness of breath suspected to be CHF exacerbation.  Patient had reported symptoms of 1 week of increasing shortness of breath, worse with exertion and better at rest.  Normally sleeps on a recliner so was unsure if she had shortness of breath if laying flat.  Also reported lower extremity edema about a week ago but improved when she elevated her feet at night.  Reported compliance with Lasix, no wheezing, coughing or fevers.  Not on home O2, denies any current tobacco use.  Reported good appetite but has lost at least 26 pounds over several months.   Patient was recently hospitalized in September for acute diastolic CHF and was started on low-dose Lasix 20 mg every other day. Initially admitted to cardiology service, [place] assumed care on 10/21  Assessment & Plan   Dyspnea with exertion, acute on chronic anemia, Symptomatic anemia -Initially thought to be from acute diastolic CHF however appeared to be somewhat volume depleted.  Also has history of COPD, acute kidney injury on CKD stage IIIb/IV, acute on chronic normocytic anemia, likely symptoms from symptomatic anemia -Lasix held, renal function improving -Baseline hemoglobin  ~11, on admission 8.0 -Anemia panel showed normal B12, folate, ferritin 17, %sat 11, Fe 32, TIBC 288,  -Status post 1  unit packed RBC transfusion on 10/21, IV ferric gluconate  x 4 doses.  H&H has remained stable after 1 unit of transfusion. -FOBT negative -SPEP negative, no M spike. UPEP in process -Home O2 evaluation prior to discharge. -If hemoglobin remained stable, no acute issues, will defer further work-up of anemia to PCP, will send  ambulatory referral to hematology.  Patient also follows nephrology outpatient, may benefit from ESA  Active Problems: Chronic CHF, likely right heart failure -Currently stable, not in any volume overload -Lasix was held, creatinine trended up to 2.5, (baseline 1.7-1.9) -2D echo showed EF of 65 to 70%, G1 DD  -Appreciate cardiology recommendations, per Dr Sallyanne Kuster, echo showed no evidence of LVH, left atrial enlargement or any true LV diastolic dysfunction, minimally elevated BNP when she presented with volume overload during the previous admission.  Recommended to keep Lasix as needed if she develops lower extremity edema due to right heart failure.  Right hip pain likely due to hematoma -Patient had mechanical fall on 10/23 evening while trying to go from bed to the chair -X-rays negative for any fracture or dislocation.   -Continues to complain  of right hip tenderness, on examination appears to have significant swelling and likely hematoma confirmed by CT femur -CT showed large hematoma in the gluteal compartment above and below the level of hip joint approximately 13.7 cm  Acute on CKD stage IV -Baseline creatinine 1.7-1.9, creatinine was 2.28 in 05/2021, likely worsened due to diuresis and anemia -Lasix has been discontinued, creatinine currently 2.3 -[facility], Dr. Royce Macadamia.  Discussed with patient's daughter, recommended labs in 1 week  COPD -Patient has underlying severe COPD, does not use O2 at home, home O2 evaluation prior to discharge. Stable, no wheezing  Failure to thrive -PT evaluation recommended home health PT, no OT  follow-up -Continue Megace, Remeron -Patient reports she lives with her family, otherwise is functional with her ADLs, alert and oriented x3  Hypothyroidism -Continue Synthroid, TSH 1.6  Moderate to severe protein calorie malnutrition, underweight Estimated body mass index is 19.37 kg/m as calculated from the following:   Height as of this encounter: _0  (1.549 m).   Weight as of this encounter: 46.5 kg. -Nutrition consult  Code Status: Full CODE STATUS DVT Prophylaxis:  Place and maintain sequential compression device Start: 07/21/21 0953   Level of Care: Level of care: Telemetry Cardiac Family Communication: Discussed all imaging results, lab results, explained to the patient's daughter on phone in detail on 10/24.  Unable to reach her today 10/25.   Plan for DC home with home health PT OT  if no acute issues and patient is able to ambulate.   Disposition Plan:     Status is: Inpatient,  Time Spent in minutes 25 minutes  Procedures:  2D echo CT of the femur  Antimicrobials:   Anti-infectives (From admission, onward)    None          Medications  Scheduled Meds:  amLODipine  10 mg Oral Daily   feeding supplement  237 mL Oral BID BM   isosorbide-hydrALAZINE  1 tablet Oral TID   levothyroxine  37.5 mcg Oral Q0600   mouth rinse  15 mL Mouth Rinse BID   megestrol  600 mg Oral Daily   mirtazapine  15 mg Oral QHS   mometasone-formoterol  2 puff Inhalation BID   multivitamin with minerals  1 tablet Oral Daily   rosuvastatin  10 mg Oral Daily   Continuous Infusions:   PRN Meds:.acetaminophen, albuterol, nitroGLYCERIN, ondansetron (ZOFRAN) IV      Subjective:   Leslie Reeves was seen and examined today.  Complaining of right hip and thigh tenderness.  On examination, has induration and tenderness compared to the left thigh.  No chest pain or shortness of breath, no lower leg swelling.    Objective:   Vitals:   07/21/21 0835 07/21/21 0853 07/21/21 1137  07/21/21 1543  BP: (!) 138/59  (!) 134/48 (!) 150/51  Pulse:   98   Resp:   18   Temp:   98.3 F (36.8 C)   TempSrc:   Oral   SpO2:  96% 97%   Weight:      Height:        Intake/Output Summary (Last 24 hours) at 07/21/2021 1643 Last data filed at 07/21/2021 0524 Gross per 24 hour  Intake 912.48 ml  Output 400 ml  Net 512.48 ml     Wt Readings from Last 3 Encounters:  07/21/21 46.5 kg  07/15/21 41.4 kg  07/13/21 41.1 kg   Physical Exam General: Alert and oriented x 3, NAD Cardiovascular: S1 S2 clear,  RRR.  Respiratory: CTAB, no wheezing, rales or rhonchi Gastrointestinal: Soft, nontender, nondistended, NBS Ext: no pedal edema bilaterally Musculoskeletal: Able to lift up the right leg and bend the knee.  Tenderness and induration noted on the right gluteal and thigh area.  Data Reviewed:  I have personally reviewed following labs and imaging studies  Micro Results Recent Results (from the past 240 hour(s))  Resp Panel by RT-PCR (Flu A&B, Covid) Nasopharyngeal Swab     Status: None   Collection Time: 07/15/21  6:23 PM   Specimen: Nasopharyngeal Swab; Nasopharyngeal(NP) swabs in vial transport medium  Result Value Ref Range Status   SARS Coronavirus 2 by RT PCR NEGATIVE NEGATIVE Final    Comment: (NOTE) SARS-CoV-2 target nucleic acids are NOT DETECTED.  The SARS-CoV-2 RNA is generally detectable in upper respiratory specimens during the acute phase of infection. The lowest concentration of SARS-CoV-2 viral copies this assay can detect is 138 copies/mL. A negative result does not preclude SARS-Cov-2 infection and should not be used as the sole basis for treatment or other patient management decisions. A negative result may occur with  improper specimen collection/handling, submission of specimen other than nasopharyngeal swab, presence of viral mutation(s) within the areas targeted by this assay, and inadequate number of viral copies(<138 copies/mL). A negative  result must be combined with clinical observations, patient history, and epidemiological information. The expected result is Negative.  Fact Sheet for Patients:  EntrepreneurPulse.com.au  Fact Sheet for Healthcare Providers:  IncredibleEmployment.be  This test is no t yet approved or cleared by the [place] FDA and  has been authorized for detection and/or diagnosis of SARS-CoV-2 by FDA under an Emergency Use Authorization (EUA). This EUA will remain  in effect (meaning this test can be used) for the duration of the COVID-19 declaration under Section 564(b)(1) of the Act, 21 U.S.C.section 360bbb-3(b)(1), unless the authorization is terminated  or revoked sooner.       Influenza A by PCR NEGATIVE NEGATIVE Final   Influenza B by PCR NEGATIVE NEGATIVE Final    Comment: (NOTE) The Xpert Xpress SARS-CoV-2/FLU/RSV plus assay is intended as an aid in the diagnosis of influenza from Nasopharyngeal swab specimens and should not be used as a sole basis for treatment. Nasal washings and aspirates are unacceptable for Xpert Xpress SARS-CoV-2/FLU/RSV testing.  Fact Sheet for Patients: EntrepreneurPulse.com.au  Fact Sheet for Healthcare Providers: IncredibleEmployment.be  This test is not yet approved or cleared by the [place] FDA and has been authorized for detection and/or diagnosis of SARS-CoV-2 by FDA under an Emergency Use Authorization (EUA). This EUA will remain in effect (meaning this test can be used) for the duration of the COVID-19 declaration under Section 564(b)(1) of the Act, 21 U.S.C. section 360bbb-3(b)(1), unless the authorization is terminated or revoked.  Performed at [facility], [place] [address] [place], [place] [address]     Radiology Reports CT FEMUR RIGHT WO CONTRAST  Result Date: 07/21/2021 CLINICAL DATA:  Upper leg pain, stress fracture suspected, neg xray had a fall  on 10/23, xrays negative for fracture or dislocation, intractable pain, r/o hematoma or occult fracture EXAM: CT OF THE LOWER RIGHT EXTREMITY WITHOUT CONTRAST TECHNIQUE: Multidetector CT imaging of the right lower extremity was performed according to the standard protocol. COMPARISON:  Hip radiograph 07/20/2021. FINDINGS: Bones/Joint/Cartilage There is no evidence of acute fracture or dislocation. There is mild right hip osteoarthritis. Lower lumbar spine degenerative changes. Extensive motion artifact at the level of the knee. Muscles/Tendons and Soft  Tissues There is a large heterogeneous collection of mixed high and low density, measuring up to 13.9 x 4.6 cm in the axial dimension just above the hip joint within the gluteal compartment extending along the piriformis towards the pelvis, tracks lateral and posterior to the greater trochanter measuring 9.0 x 3.2 cm axially, and measures 7.7 x 7.2 cm below the hip joint axially (series 5, images 70, 96, and 116). Overall craniocaudal extent spans approximately 13.7 cm (coronal image 123. Extensive additional generalized soft tissue swelling along the right thigh. IMPRESSION: Large heterogeneous collection within the gluteal compartment above and below the level of the hip joint, most likely a hematoma, axial measurements above. Overall craniocaudal extent spans approximately 13.7 cm. No evidence of acute fracture. Electronically Signed   By: Maurine Simmering M.D.   On: 07/21/2021 11:33   DG Bone Density  Result Date: 07/07/2021 EXAM: DUAL X-RAY ABSORPTIOMETRY (DXA) FOR BONE MINERAL DENSITY IMPRESSION: Referring Physician:  [name] Your patient completed a bone mineral density test using GE Lunar iDXA system (analysis version: 16). Technologist: [place] PATIENT: Name: Nima, Bamburg Patient ID:  [id number] Birth Date: 1940-11-29 Height:     61.0 in. Sex:         Female Measured:   07/06/2021 Weight:     98.1 lbs. Indications: Advanced Age, Bilateral Ovariectomy  (65.51), Estrogen Deficient, Hysterectomy, Postmenopausal Fractures: None Treatments: None ASSESSMENT: The BMD measured at Forearm Radius 33% is 0.590 g/cm2 with a T-score of -3.4. This patient is considered OSTEOPOROTIC according to [facility]) criteria. The quality of the exam is good. The lumbar spine was excluded due to degenerative changes. Site Region Measured Date Measured Age YA BMD Significant CHANGE T-score Left Forearm Radius 33% 07/06/2021 80.4 -3.4 0.590 g/cm2 DualFemur Neck Right 07/06/2021 80.4 -2.3 0.722 g/cm2 * DualFemur Neck Right 07/03/2018 77.4 -1.4 0.845 g/cm2 DualFemur Total Mean 07/06/2021 80.4 -1.5 0.823 g/cm2 * DualFemur Total Mean 07/03/2018 77.4 -0.8 0.906 g/cm2 World Health Organization [facility]) criteria for post-menopausal, Caucasian Women: Normal       T-score at or above -1 SD Osteopenia   T-score between -1 and -2.5 SD Osteoporosis T-score at or below -2.5 SD RECOMMENDATION: 1. All patients should optimize calcium and vitamin D intake. 2. Consider FDA-approved medical therapies in postmenopausal women and men aged 38 years and older, based on the following: a. A hip or vertebral (clinical or morphometric) fracture. b. T-score = -2.5 at the femoral neck or spine after appropriate evaluation to exclude secondary causes. c. Low bone mass (T-score between -1.0 and -2.5 at the femoral neck or spine) and a 10-year probability of a hip fracture = 3% or a 10-year probability of a major osteoporosis-related fracture = 20% based on the [place]-adapted WHO algorithm. d. Clinician judgment and/or patient preferences may indicate treatment for people with 10-year fracture probabilities above or below these levels. FOLLOW-UP: Patients with diagnosis of osteoporosis or at high risk for fracture should have regular bone mineral density tests.? Patients eligible for Medicare are allowed routine testing every 2 years.? The testing frequency can be increased to one year for patients who have  rapidly progressing disease, are receiving or discontinuing medical therapy to restore bone mass, or have additional risk factors. I have reviewed this study and agree with the findings. Mark A. Thornton Papas, M.D. [facility] Electronically Signed   By: Lavonia Dana M.D.   On: 07/07/2021 08:23   DG CHEST PORT 1 VIEW  Result Date: 07/15/2021 CLINICAL DATA:  Shortness of breath EXAM: PORTABLE CHEST 1 VIEW COMPARISON:  06/11/2021 FINDINGS: There is hyperinflation of the lungs compatible with COPD. Heart is upper limits normal in size. Aortic atherosclerosis. No confluent airspace opacities or effusions. No acute bony abnormality. IMPRESSION: COPD.  No active disease. Electronically Signed   By: Rolm Baptise M.D.   On: 07/15/2021 20:06   DG HIP UNILAT WITH PELVIS 2-3 VIEWS RIGHT  Result Date: 07/20/2021 CLINICAL DATA:  Right hip pain after fall yesterday. EXAM: DG HIP (WITH OR WITHOUT PELVIS) 2-3V RIGHT COMPARISON:  None. FINDINGS: There is no evidence of hip fracture or dislocation. There is no evidence of arthropathy or other focal bone abnormality. IMPRESSION: Negative. Electronically Signed   By: Marijo Conception M.D.   On: 07/20/2021 10:33   ECHOCARDIOGRAM LIMITED  Result Date: 07/16/2021    ECHOCARDIOGRAM LIMITED REPORT   Patient Name:   KELITA WALLIS Date of Exam: 07/16/2021 Medical Rec #:  [id number]      Height:       61.0 in Accession #:    [id number]     Weight:       90.4 lb Date of Birth:  1940/11/27       BSA:          1.348 m Patient Age:    49 years       BP:           147/57 mmHg Patient Gender: F              HR:           68 bpm. Exam Location:  Inpatient Procedure: Limited Echo, Limited Color Doppler and Cardiac Doppler Indications:    dyspnea  History:        Patient has prior history of Echocardiogram examinations, most                 recent 06/12/2021. COPD; Risk Factors:Diabetes and Hypertension.  Sonographer:    Johny Chess RDCS Referring Phys: [id number] [place]  1. Left ventricular ejection fraction, by estimation, is 65 to 70%. The left ventricle has no regional wall motion abnormalities. There is mild to moderate left ventricular hypertrophy. Left ventricular diastolic parameters are consistent with Grade I diastolic dysfunction (impaired relaxation).  2. Right ventricular systolic function is normal. The right ventricular size is normal. There is moderately elevated pulmonary artery systolic pressure.  3. The mitral valve is normal in structure. Trivial mitral valve regurgitation. No evidence of mitral stenosis.  4. The aortic valve is normal in structure. Aortic valve regurgitation is not visualized. No aortic stenosis is present.  5. The inferior vena cava is normal in size with greater than 50% respiratory variability, suggesting right atrial pressure of 3 mmHg. FINDINGS  Left Ventricle: Left ventricular ejection fraction, by estimation, is 65 to 70%. The left ventricle has no regional wall motion abnormalities. The left ventricular internal cavity size was normal in size. There is mild left ventricular hypertrophy. Left  ventricular diastolic parameters are consistent with Grade I diastolic dysfunction (impaired relaxation). Right Ventricle: The right ventricular size is normal. No increase in right ventricular wall thickness. Right ventricular systolic function is normal. There is moderately elevated pulmonary artery systolic pressure. The tricuspid regurgitant velocity is 3.56 m/s, and with an assumed right atrial pressure of 3 mmHg, the estimated right ventricular systolic pressure is 76.2 mmHg. Left Atrium: Left atrial size was normal in size. Right Atrium: Right atrial size was normal in size.  Pericardium: There is no evidence of pericardial effusion. Mitral Valve: The mitral valve is normal in structure. Trivial mitral valve regurgitation. No evidence of mitral valve stenosis. Tricuspid Valve: The tricuspid valve is normal in structure.  Tricuspid valve regurgitation is trivial. No evidence of tricuspid stenosis. Aortic Valve: The aortic valve is normal in structure. Aortic valve regurgitation is not visualized. No aortic stenosis is present. Pulmonic Valve: The pulmonic valve was normal in structure. Pulmonic valve regurgitation is trivial. No evidence of pulmonic stenosis. Aorta: The aortic root is normal in size and structure. Venous: The inferior vena cava is normal in size with greater than 50% respiratory variability, suggesting right atrial pressure of 3 mmHg. IAS/Shunts: No atrial level shunt detected by color flow Doppler. LEFT VENTRICLE PLAX 2D LVIDd:         4.10 cm   Diastology LVIDs:         2.40 cm   LV e' medial:    8.59 cm/s LV PW:         0.80 cm   LV E/e' medial:  12.0 LV IVS:        0.90 cm   LV e' lateral:   11.90 cm/s LVOT diam:     1.70 cm   LV E/e' lateral: 8.7 LV SV:         67 LV SV Index:   49 LVOT Area:     2.27 cm  IVC IVC diam: 1.20 cm LEFT ATRIUM         Index LA diam:    3.00 cm 2.23 cm/m  AORTIC VALVE LVOT Vmax:   137.00 cm/s LVOT Vmean:  82.900 cm/s LVOT VTI:    0.293 m  AORTA Ao Asc diam: 3.20 cm MITRAL VALVE                TRICUSPID VALVE MV Area (PHT): 3.37 cm     TR Peak grad:   50.7 mmHg MV Decel Time: 225 msec     TR Vmax:        356.00 cm/s MV E velocity: 103.00 cm/s MV A velocity: 114.00 cm/s  SHUNTS MV E/A ratio:  0.90         Systemic VTI:  0.29 m                             Systemic Diam: 1.70 cm Glori Bickers MD Electronically signed by Glori Bickers MD Signature Date/Time: 07/16/2021/4:28:36 PM    Final     Lab Data:  CBC: Recent Labs  Lab 07/15/21 2012 07/16/21 0345 07/17/21 0324 07/17/21 1841 07/18/21 0642 07/20/21 0436  WBC 5.0 4.4 4.6  --  6.0 7.0  HGB 8.0* 7.7* 7.7* 9.4* 9.4* 8.4*  HCT 25.5* 24.7* 24.3* 29.7* 29.3* 26.5*  MCV 84.4 84.6 83.8  --  84.0 86.9  PLT 272 274 287  --  284 093   Basic Metabolic Panel: Recent Labs  Lab 07/15/21 2012 07/16/21 0345 07/17/21 0324  07/18/21 0642 07/20/21 0436  NA 141 144 141 142 144  K 3.7 3.6 4.0 4.3 5.0  CL 107 111 111 113* 112*  CO2 _0 GLUCOSE 96 81 103* 103* 110*  BUN 85* 82* 77* 68* 70*  CREATININE 2.47* 2.33* 2.54* 2.21* 2.39*  CALCIUM 8.8* 8.8* 8.4* 9.0 9.1   GFR: Estimated Creatinine Clearance: 13.8 mL/min (A) (by C-G formula based on SCr of 2.39 mg/dL (H)). Liver Function  Tests: Recent Labs  Lab 07/15/21 2012  AST 23  ALT 29  ALKPHOS 38  BILITOT 0.4  PROT 5.8*  ALBUMIN 2.9*   No results for input(s): LIPASE, AMYLASE in the last 168 hours. No results for input(s): AMMONIA in the last 168 hours. Coagulation Profile: No results for input(s): INR, PROTIME in the last 168 hours. Cardiac Enzymes: No results for input(s): CKTOTAL, CKMB, CKMBINDEX, TROPONINI in the last 168 hours. BNP (last 3 results) No results for input(s): PROBNP in the last 8760 hours. HbA1C: No results for input(s): HGBA1C in the last 72 hours. CBG: No results for input(s): GLUCAP in the last 168 hours. Lipid Profile: No results for input(s): CHOL, HDL, LDLCALC, TRIG, CHOLHDL, LDLDIRECT in the last 72 hours. Thyroid Function Tests: No results for input(s): TSH, T4TOTAL, FREET4, T3FREE, THYROIDAB in the last 72 hours.  Anemia Panel: No results for input(s): VITAMINB12, FOLATE, FERRITIN, TIBC, IRON, RETICCTPCT in the last 72 hours.  Urine analysis:    Component Value Date/Time   COLORURINE YELLOW 07/17/2021 [place] 07/17/2021 1434   LABSPEC 1.014 07/17/2021 1434   PHURINE 5.0 07/17/2021 1434   GLUCOSEU NEGATIVE 07/17/2021 1434   [place] 07/17/2021 [place] 07/17/2021 1434   BILIRUBINUR Negative 07/13/2021 [place] 07/17/2021 1434   PROTEINUR 100 (A) 07/17/2021 1434   UROBILINOGEN 0.2 07/13/2021 1654   NITRITE NEGATIVE 07/17/2021 1434   LEUKOCYTESUR NEGATIVE 07/17/2021 1434     Jun Osment M.D. [facility] 07/21/2021, 4:43  PM  Available via Epic secure chat 7am-7pm After 7 pm, please refer to night coverage provider listed on amion.

## 2021-07-21 NOTE — Plan of Care (Signed)
  Problem: Clinical Measurements: Goal: Respiratory complications will improve Outcome: Progressing   Problem: Activity: Goal: Risk for activity intolerance will decrease Outcome: Progressing

## 2021-07-21 NOTE — Progress Notes (Signed)
Patients right hip still hurting, has a limp when she walks. Gave prn medication, but it hurts when she stands/walks. Will continue to monitor.

## 2021-07-21 NOTE — Progress Notes (Signed)
Patient O2 sats 88% on room air, place O2 via nasal cannula at 2L, O2 sats went up to 98%.

## 2021-07-21 NOTE — Plan of Care (Signed)
  Problem: Activity: Goal: Risk for activity intolerance will decrease Outcome: Progressing   Problem: Nutrition: Goal: Adequate nutrition will be maintained Outcome: Progressing   Problem: Pain Managment: Goal: General experience of comfort will improve Outcome: Progressing   Problem: Safety: Goal: Ability to remain free from injury will improve Outcome: Progressing   Problem: Skin Integrity: Goal: Risk for impaired skin integrity will decrease Outcome: Progressing

## 2021-07-21 NOTE — TOC Progression Note (Signed)
Transition of Care [facility]) - Progression Note    Patient Details  Name: Leslie Reeves MRN: [id number] Date of Birth: 1941-07-12  [facility] CM/SW Contact  Zenon Mayo, RN Phone Number: 07/21/2021, 10:09 AM  Clinical Narrative:    NCM spoke with patient, offered choice for HHPT/HHOT, she states to call her daughter.  NCM spoke with daughter , offered choice, she does not have a preference.  She states yes patient will need a tub shower bench.  Also, MD will be ordering a ct scan today to check for hematoma.          Expected Discharge Plan and Services                                                 Social Determinants of Health (SDOH) Interventions    Readmission Risk Interventions No flowsheet data found.

## 2021-07-22 ENCOUNTER — Ambulatory Visit: Payer: Medicare Other | Admitting: Neurology

## 2021-07-22 DIAGNOSIS — D509 Iron deficiency anemia, unspecified: Secondary | ICD-10-CM | POA: Diagnosis not present

## 2021-07-22 DIAGNOSIS — D62 Acute posthemorrhagic anemia: Secondary | ICD-10-CM | POA: Diagnosis not present

## 2021-07-22 DIAGNOSIS — E875 Hyperkalemia: Secondary | ICD-10-CM

## 2021-07-22 DIAGNOSIS — I5032 Chronic diastolic (congestive) heart failure: Secondary | ICD-10-CM | POA: Diagnosis not present

## 2021-07-22 DIAGNOSIS — E43 Unspecified severe protein-calorie malnutrition: Secondary | ICD-10-CM

## 2021-07-22 DIAGNOSIS — D649 Anemia, unspecified: Secondary | ICD-10-CM | POA: Diagnosis not present

## 2021-07-22 LAB — RENAL FUNCTION PANEL
Albumin: 2.4 g/dL — ABNORMAL LOW (ref 3.5–5.0)
Anion gap: 3 — ABNORMAL LOW (ref 5–15)
BUN: 77 mg/dL — ABNORMAL HIGH (ref 8–23)
CO2: 26 mmol/L (ref 22–32)
Calcium: 9.3 mg/dL (ref 8.9–10.3)
Chloride: 113 mmol/L — ABNORMAL HIGH (ref 98–111)
Creatinine, Ser: 2.33 mg/dL — ABNORMAL HIGH (ref 0.44–1.00)
GFR, Estimated: 21 mL/min — ABNORMAL LOW (ref >60)
Glucose, Bld: 100 mg/dL — ABNORMAL HIGH (ref 70–99)
Phosphorus: 2.5 mg/dL (ref 2.5–4.6)
Potassium: 5.6 mmol/L — ABNORMAL HIGH (ref 3.5–5.1)
Sodium: 142 mmol/L (ref 135–145)

## 2021-07-22 LAB — UPEP/UIFE/LIGHT CHAINS/TP, 24-HR UR
% BETA, Urine: 9.1 %
ALPHA 1 URINE: 1.7 %
Albumin, U: 72.9 %
Alpha 2, Urine: 5.5 %
Free Kappa Lt Chains,Ur: 124.76 mg/L — ABNORMAL HIGH (ref 1.17–86.46)
Free Kappa/Lambda Ratio: 5.3 (ref 1.83–14.26)
Free Lambda Lt Chains,Ur: 23.53 mg/L — ABNORMAL HIGH (ref 0.27–15.21)
GAMMA GLOBULIN URINE: 10.8 %
Total Protein, Urine-Ur/day: 470 mg/24 hr — ABNORMAL HIGH (ref 30–150)
Total Protein, Urine: 117.5 mg/dL
Total Volume: 400

## 2021-07-22 LAB — CBC
HCT: 17.9 % — ABNORMAL LOW (ref 36.0–46.0)
Hemoglobin: 5.6 g/dL — CL (ref 12.0–15.0)
MCH: 28.3 pg (ref 26.0–34.0)
MCHC: 31.3 g/dL (ref 30.0–36.0)
MCV: 90.4 fL (ref 80.0–100.0)
Platelets: 277 10*3/uL (ref 150–400)
RBC: 1.98 MIL/uL — ABNORMAL LOW (ref 3.87–5.11)
RDW: 26.5 % — ABNORMAL HIGH (ref 11.5–15.5)
WBC: 9.7 10*3/uL (ref 4.0–10.5)
nRBC: 0.4 % — ABNORMAL HIGH (ref 0.0–0.2)

## 2021-07-22 LAB — CK: Total CK: 130 U/L (ref 38–234)

## 2021-07-22 LAB — PREPARE RBC (CROSSMATCH)

## 2021-07-22 LAB — MAGNESIUM: Magnesium: 2.3 mg/dL (ref 1.7–2.4)

## 2021-07-22 MED ORDER — SODIUM ZIRCONIUM CYCLOSILICATE 10 G PO PACK
10.0000 g | PACK | Freq: Once | ORAL | Status: AC
  Administered 2021-07-22: 10 g via ORAL
  Filled 2021-07-22: qty 1

## 2021-07-22 MED ORDER — SODIUM CHLORIDE 0.9% IV SOLUTION: Freq: Once | INTRAVENOUS | Status: AC

## 2021-07-22 NOTE — Plan of Care (Signed)
  Problem: Clinical Measurements: Goal: Ability to maintain clinical measurements within normal limits will improve Outcome: Progressing   Problem: Clinical Measurements: Goal: Respiratory complications will improve Outcome: Progressing   Problem: Clinical Measurements: Goal: Cardiovascular complication will be avoided Outcome: Progressing   Problem: Activity: Goal: Risk for activity intolerance will decrease Outcome: Progressing   Problem: Pain Managment: Goal: General experience of comfort will improve Outcome: Progressing

## 2021-07-22 NOTE — Progress Notes (Signed)
PROGRESS NOTE  Leslie Reeves JJH:[id number] DOB: [birth year]   PCP: [name], FNP  Patient is from: Home.  Lives with family.  Independent with ADLs at baseline.  DOA: 07/15/2021 LOS: 5  Chief complaints:  No chief complaint on file.    Brief Narrative / Interim history: 80 year old F with PMH of COPD, diastolic CHF, CVA, CKD-4 and prediabetes admitted from cardiology office with concern CHF exacerbation after she presented with progressive shortness of breath and DOE for 1 week.  Initially admitted by cardiology and started on IV Lasix, and developed AKI.  Patient's symptoms felt to be due to symptomatic iron deficiency anemia versus CHF exacerbation.  Hemoccult, SPEP and M spike negative.  She was transferred to [facility] service on 07/17/2021.  She had blood transfusion with appropriate response.  She also had iron infusion.  However, patient had right hip pain after accidental fall on 10/23.  CT showed large hematoma measuring 13.7 cm in the largest dimension in the gluteal compartment.  Pharmacologic VTE prophylaxis discontinued.  Repeat Hgb on 10/26 down to 5.6.   Subjective: Seen and examined earlier this morning.  No major events overnight of this morning.  No complaints.  She denies chest pain, dyspnea, palpitation, GI or UTI symptoms.  Objective: Vitals:   07/22/21 0838 07/22/21 0857 07/22/21 1432 07/22/21 1501  BP:  (!) 117/56 (!) 130/56 (!) 128/47  Pulse: 85 86 72 70  Resp: 20  16   Temp:  99 F (37.2 C) 98.2 F (36.8 C) 98.6 F (37 C)  TempSrc:  Oral Oral Oral  SpO2: 92% 95% 94% 97%  Weight:      Height:        Intake/Output Summary (Last 24 hours) at 07/22/2021 1643 Last data filed at 07/21/2021 2141 Gross per 24 hour  Intake 460 ml  Output 600 ml  Net -140 ml   Filed Weights   07/20/21 0324 07/21/21 0233 07/22/21 0037  Weight: 43.5 kg 46.5 kg 44.7 kg    Examination:  GENERAL: Frail looking elderly female. HEENT: MMM.  Vision and hearing grossly intact.   NECK: Supple.  No apparent JVD.  RESP:  No IWOB.  Fair aeration bilaterally. CVS:  RRR. Heart sounds normal.  ABD/GI/GU: BS+. Abd soft, NTND.  MSK/EXT:  Moves extremities.  Significant muscle mass and subcu fat loss. SKIN: Lipoma over left upper back.  No bruising or ecchymosis over right hip or back. NEURO: Awake, alert and oriented appropriately.  No apparent focal neuro deficit. PSYCH: Calm. Normal affect.   Procedures:  None  Microbiology summarized: [id number] and influenza PCR nonreactive.  Assessment & Plan: Acute blood loss anemia superimposed on symptomatic iron deficiency anemia-patient has ABLA due to right gluteal hematoma from fall in the hospital on 10/23.  Hemoccult and SPEP negative.  No M spike.  Initially transfused 1 unit on 10/21 with appropriate response. Recent Labs    01/20/21 1134 06/11/21 1921 07/15/21 2012 07/16/21 0345 07/17/21 0324 07/17/21 1841 07/18/21 0642 07/20/21 0436 07/22/21 0841  HGB 11.9 11.1* 8.0* 7.7* 7.7* 9.4* 9.4* 8.4* 5.6*  -Transfuse 2 units -Status post 4 doses of ferric gluconate -Serial H&H -Will discuss CTA if further drop in H&H after transfusion -SCD for VTE prophylaxis. -Follow UPEP  Chronic diastolic CHF: TTE with LVEF of 65 to 70%, G1-DD.  Appears euvolemic on exam.  Patient's symptoms likely symptomatic anemia versus CHF exacerbation. -Diuretics on hold due to AKI -Continue Lasix as needed on discharge -Monitor fluid status, renal functions and  electrolytes.  Right hip/gluteal hematoma due to accidental fall in the hospital: CTA with large hematoma measuring 13.5 cm but no report of extravasation. -May need repeat CTA if H&H drops again -Fall precaution -SCD for VTE prophylaxis   Acute on CKD stage IV/azotemia: Cr seems to be plateauing. CK normal. Followed by Dr. Royce Macadamia with [facility].  Recent Labs    06/11/21 1921 06/12/21 0428 06/13/21 0428 06/18/21 1231 07/15/21 2012 07/16/21 0345 07/17/21 0324 07/18/21 0642  07/20/21 0436 07/22/21 0841  BUN 32* 33* 34* 55* 85* 82* 77* 68* 70* 77*  CREATININE 1.86* 1.79* 1.97* 2.28* 2.47* 2.33* 2.54* 2.21* 2.39* 2.33*  -Diuretics discontinued -Recheck in the morning -Renal ultrasound if no improvement.  Mild hyperkalemia: Likely due to AKI. -Lokelma 10 g x 1   Chronic COPD: Stable -Continue inhalers.   Generalized weakness/physical deconditioning -PT/OT   Hypothyroidism: TSH 1.6. -Continue Synthroid  Severe malnutrition/failure to thrive in adult-significant muscle mass and subcu fat loss. Body mass index is 18.62 kg/m. Nutrition Problem: Severe Malnutrition Etiology: chronic illness (COPD, CHF) Signs/Symptoms: severe fat depletion, severe muscle depletion Interventions: Ensure Enlive (each supplement provides 350kcal and 20 grams of protein), MVI   DVT prophylaxis:  Place and maintain sequential compression device Start: 07/21/21 0953  Code Status: Full code Family Communication: Patient and/or RN. Available if any question.  Level of care: Telemetry Cardiac Status is: Inpatient  Remains inpatient appropriate because: Acute blood loss anemia requiring blood transfusions  Consultants:  Cardiology   Sch Meds:  Scheduled Meds:  amLODipine  10 mg Oral Daily   feeding supplement  237 mL Oral BID BM   isosorbide-hydrALAZINE  1 tablet Oral TID   levothyroxine  37.5 mcg Oral Q0600   mouth rinse  15 mL Mouth Rinse BID   megestrol  600 mg Oral Daily   mirtazapine  15 mg Oral QHS   mometasone-formoterol  2 puff Inhalation BID   multivitamin with minerals  1 tablet Oral Daily   rosuvastatin  10 mg Oral Daily   Continuous Infusions: PRN Meds:.acetaminophen, albuterol, nitroGLYCERIN, ondansetron (ZOFRAN) IV  Antimicrobials: Anti-infectives (From admission, onward)    None        I have personally reviewed the following labs and images: CBC: Recent Labs  Lab 07/16/21 0345 07/17/21 0324 07/17/21 1841 07/18/21 0642 07/20/21 0436  07/22/21 0841  WBC 4.4 4.6  --  6.0 7.0 9.7  HGB 7.7* 7.7* 9.4* 9.4* 8.4* 5.6*  HCT 24.7* 24.3* 29.7* 29.3* 26.5* 17.9*  MCV 84.6 83.8  --  84.0 86.9 90.4  PLT 274 287  --  284 288 277   BMP &GFR Recent Labs  Lab 07/16/21 0345 07/17/21 0324 07/18/21 0642 07/20/21 0436 07/22/21 0841  NA 144 141 142 144 142  K 3.6 4.0 4.3 5.0 5.6*  CL 111 111 113* 112* 113*  CO2 _0 GLUCOSE 81 103* 103* 110* 100*  BUN 82* 77* 68* 70* 77*  CREATININE 2.33* 2.54* 2.21* 2.39* 2.33*  CALCIUM 8.8* 8.4* 9.0 9.1 9.3  MG  --   --   --   --  2.3  PHOS  --   --   --   --  2.5   Estimated Creatinine Clearance: 13.6 mL/min (A) (by C-G formula based on SCr of 2.33 mg/dL (H)). Liver & Pancreas: Recent Labs  Lab 07/15/21 2012 07/22/21 0841  AST 23  --   ALT 29  --   ALKPHOS 38  --   BILITOT  0.4  --   PROT 5.8*  --   ALBUMIN 2.9* 2.4*   No results for input(s): LIPASE, AMYLASE in the last 168 hours. No results for input(s): AMMONIA in the last 168 hours. Diabetic: No results for input(s): HGBA1C in the last 72 hours. No results for input(s): GLUCAP in the last 168 hours. Cardiac Enzymes: Recent Labs  Lab 07/22/21 0841  CKTOTAL 130   No results for input(s): PROBNP in the last 8760 hours. Coagulation Profile: No results for input(s): INR, PROTIME in the last 168 hours. Thyroid Function Tests: No results for input(s): TSH, T4TOTAL, FREET4, T3FREE, THYROIDAB in the last 72 hours. Lipid Profile: No results for input(s): CHOL, HDL, LDLCALC, TRIG, CHOLHDL, LDLDIRECT in the last 72 hours. Anemia Panel: No results for input(s): VITAMINB12, FOLATE, FERRITIN, TIBC, IRON, RETICCTPCT in the last 72 hours. Urine analysis:    Component Value Date/Time   COLORURINE YELLOW 07/17/2021 [place] 07/17/2021 1434   LABSPEC 1.014 07/17/2021 1434   PHURINE 5.0 07/17/2021 1434   GLUCOSEU NEGATIVE 07/17/2021 1434   [place] 07/17/2021 [place] 07/17/2021  1434   BILIRUBINUR Negative 07/13/2021 [place] 07/17/2021 1434   PROTEINUR 100 (A) 07/17/2021 1434   UROBILINOGEN 0.2 07/13/2021 1654   NITRITE NEGATIVE 07/17/2021 1434   LEUKOCYTESUR NEGATIVE 07/17/2021 1434   Sepsis Labs: Invalid input(s): PROCALCITONIN, [place]  Microbiology: Recent Results (from the past 240 hour(s))  Resp Panel by RT-PCR (Flu A&B, Covid) Nasopharyngeal Swab     Status: None   Collection Time: 07/15/21  6:23 PM   Specimen: Nasopharyngeal Swab; Nasopharyngeal(NP) swabs in vial transport medium  Result Value Ref Range Status   SARS Coronavirus 2 by RT PCR NEGATIVE NEGATIVE Final    Comment: (NOTE) SARS-CoV-2 target nucleic acids are NOT DETECTED.  The SARS-CoV-2 RNA is generally detectable in upper respiratory specimens during the acute phase of infection. The lowest concentration of SARS-CoV-2 viral copies this assay can detect is 138 copies/mL. A negative result does not preclude SARS-Cov-2 infection and should not be used as the sole basis for treatment or other patient management decisions. A negative result may occur with  improper specimen collection/handling, submission of specimen other than nasopharyngeal swab, presence of viral mutation(s) within the areas targeted by this assay, and inadequate number of viral copies(<138 copies/mL). A negative result must be combined with clinical observations, patient history, and epidemiological information. The expected result is Negative.  Fact Sheet for Patients:  EntrepreneurPulse.com.au  Fact Sheet for Healthcare Providers:  IncredibleEmployment.be  This test is no t yet approved or cleared by the [place] FDA and  has been authorized for detection and/or diagnosis of SARS-CoV-2 by FDA under an Emergency Use Authorization (EUA). This EUA will remain  in effect (meaning this test can be used) for the duration of the COVID-19 declaration under  Section 564(b)(1) of the Act, 21 U.S.C.section 360bbb-3(b)(1), unless the authorization is terminated  or revoked sooner.       Influenza A by PCR NEGATIVE NEGATIVE Final   Influenza B by PCR NEGATIVE NEGATIVE Final    Comment: (NOTE) The Xpert Xpress SARS-CoV-2/FLU/RSV plus assay is intended as an aid in the diagnosis of influenza from Nasopharyngeal swab specimens and should not be used as a sole basis for treatment. Nasal washings and aspirates are unacceptable for Xpert Xpress SARS-CoV-2/FLU/RSV testing.  Fact Sheet for Patients: EntrepreneurPulse.com.au  Fact Sheet for Healthcare Providers: IncredibleEmployment.be  This test is not yet approved  or cleared by the [place] and has been authorized for detection and/or diagnosis of SARS-CoV-2 by FDA under an Emergency Use Authorization (EUA). This EUA will remain in effect (meaning this test can be used) for the duration of the COVID-19 declaration under Section 564(b)(1) of the Act, 21 U.S.C. section 360bbb-3(b)(1), unless the authorization is terminated or revoked.  Performed at [facility], [place] [address] [place], [place] [address]     Radiology Studies: [place] CHEST SOFT TISSUE  Result Date: 07/21/2021 CLINICAL DATA:  Left upper back mass EXAM: ULTRASOUND OF left back SOFT TISSUES TECHNIQUE: Ultrasound examination was performed in the area of clinical concern. COMPARISON:  None. FINDINGS: Targeted ultrasound of the left upper back performed in the region palpable mass. In the region of palpable concern, there is a circumscribed echogenic solid mass with some internal flow, this measures 3.9 x 1.8 x 4.5 cm. IMPRESSION: 4.5 cm solid echogenic slightly vascular mass within the left upper back corresponding to palpable mass. Findings could represent fatty based lesion, this would be better characterized with MRI. Electronically Signed   By: Donavan Foil M.D.   On: 07/21/2021  23:00      Justin Meisenheimer T. [place]  If 7PM-7AM, please contact night-coverage [email or web address] 07/22/2021, 4:43 PM

## 2021-07-22 NOTE — Progress Notes (Signed)
RT came by to given pt MDI and found pts sats to be 83% on RA. Pt was placed on 2L [place] and sats increased to 92%. RN made aware.

## 2021-07-22 NOTE — Progress Notes (Signed)
07/22/21 1500  Mobility  Activity Refused mobility (Pt was asleep on arrival; declined stating she was tired.)

## 2021-07-22 NOTE — Progress Notes (Signed)
Physical Therapy Treatment Patient Details Name: Leslie Reeves MRN: [id number] DOB: [birth year] Today's Date: 07/22/2021   History of Present Illness 80 year old female with COPD, CVA, chronic diastolic CHF, prediabetes, CKD , hypothyroidism presented as a direct admission from cardiology office on 10/19 for shortness of breath suspected to be CHF exacerbation but pt found to have symptomatic anemia with Hgb of 7.7. Pt with fall in hospital 10/23 and c/o rt hip pain on 10/24. Imaging of rt hip negative.    PT Comments    Pt was assisted with exercises on the bed today, after attempting mult times to see for gait.  Pt was unable due to waiting for transfusion, and reports fatigue and inability to tolerate moving.  Focused on recovery of her tolerance to move R hip and control of proximal stability to help her with gait when transfusion is completed.  Focus on goals of acute PT, encourage OOB to chair and to do self ROM to R hip between therapy sessions.   Recommendations for follow up therapy are one component of a multi-disciplinary discharge planning process, led by the attending physician.  Recommendations may be updated based on patient status, additional functional criteria and insurance authorization.  Follow Up Recommendations  Home health PT     Assistance Recommended at Discharge Frequent or constant Supervision/Assistance  Equipment Recommendations  None recommended by PT    Recommendations for Other Services       Precautions / Restrictions Precautions Precautions: Fall Precaution Comments: monitor O2, fall during this admission Restrictions Weight Bearing Restrictions: No Other Position/Activity Restrictions: monitor vitals     Mobility  Bed Mobility Overal bed mobility: Needs Assistance             General bed mobility comments: repositioned on bed and did not get OOB due to waiting for transfusion    Transfers Overall transfer level: Needs assistance                  General transfer comment: deferred    Ambulation/Gait                 Stairs             Wheelchair Mobility    Modified Rankin (Stroke Patients Only)       Balance                                            Cognition Arousal/Alertness: Awake/alert Behavior During Therapy: WFL for tasks assessed/performed Overall Cognitive Status: History of cognitive impairments - at baseline                                 General Comments: pt can follow directions but slow with ST memory tasks        Exercises General Exercises - Lower Extremity Ankle Circles/Pumps: AAROM;5 reps Quad Sets: AROM;10 reps Heel Slides: AAROM;10 reps Hip ABduction/ADduction: AAROM;10 reps Straight Leg Raises: AAROM;10 reps Hip Flexion/Marching: AAROM;10 reps    General Comments General comments (skin integrity, edema, etc.): hgb is 5.6 and pt is quite tired, not interested in being very mobile      Pertinent Vitals/Pain Pain Assessment: Faces Faces Pain Scale: Hurts little more Pain Location: R hip with ROM Pain Descriptors / Indicators: Guarding Pain Intervention(s): Limited activity within patient's tolerance;Repositioned;Monitored during session  Home Living                          Prior Function            PT Goals (current goals can now be found in the care plan section) Acute Rehab PT Goals Patient Stated Goal: to get home Progress towards PT goals: Not progressing toward goals - comment    Frequency    Min 3X/week      PT Plan Current plan remains appropriate    Co-evaluation              AM-PAC PT "6 Clicks" Mobility   Outcome Measure  Help needed turning from your back to your side while in a flat bed without using bedrails?: None Help needed moving from lying on your back to sitting on the side of a flat bed without using bedrails?: A Little Help needed moving to and from a bed to a  chair (including a wheelchair)?: A Little Help needed standing up from a chair using your arms (e.g., wheelchair or bedside chair)?: A Little Help needed to walk in hospital room?: A Little Help needed climbing 3-5 steps with a railing? : A Lot 6 Click Score: 18    End of Session   Activity Tolerance: Patient limited by fatigue Patient left: in bed;with call bell/phone within reach;with bed alarm set Nurse Communication: Mobility status PT Visit Diagnosis: Unsteadiness on feet (R26.81);Muscle weakness (generalized) (M62.81);Pain Pain - Right/Left: Right Pain - part of body: Hip     Time: 7793-9030 PT Time Calculation (min) (ACUTE ONLY): 26 min  Charges:  $Therapeutic Exercise: 8-22 mins $Therapeutic Activity: 8-22 mins         Ramond Dial 07/22/2021, 4:57 PM  [name], PT PhD Acute Rehab Dept. Number: [place]

## 2021-07-22 NOTE — Progress Notes (Addendum)
OT Cancellation Note  Patient Details Name: Leslie Reeves MRN: [id number] DOB: 12/14/1940   Cancelled Treatment:    Reason Eval/Treat Not Completed: Medical issues which prohibited therapy Hgb 5.6 this AM and awaiting blood transfusion. Will hold OT session until after pt receives blood.   Checked back around 1PM for OT session. Per RN, blood transfusion has not yet been started.   Layla Maw 07/22/2021, 10:52 AM

## 2021-07-22 NOTE — Progress Notes (Signed)
PT Cancellation Note  Patient Details Name: Leslie Reeves MRN: [id number] DOB: 1940-12-12   Cancelled Treatment:    Reason Eval/Treat Not Completed: Medical issues which prohibited therapy.  Pt will be seen when transfusion is done, retry as time and pt allow.   Ramond Dial 07/22/2021, 10:28 AM  [name], PT PhD Acute Rehab Dept. Number: [place]

## 2021-07-23 DIAGNOSIS — D62 Acute posthemorrhagic anemia: Secondary | ICD-10-CM | POA: Diagnosis not present

## 2021-07-23 DIAGNOSIS — D649 Anemia, unspecified: Secondary | ICD-10-CM | POA: Diagnosis not present

## 2021-07-23 DIAGNOSIS — D509 Iron deficiency anemia, unspecified: Secondary | ICD-10-CM | POA: Diagnosis not present

## 2021-07-23 DIAGNOSIS — I5032 Chronic diastolic (congestive) heart failure: Secondary | ICD-10-CM | POA: Diagnosis not present

## 2021-07-23 LAB — RENAL FUNCTION PANEL
Albumin: 2.3 g/dL — ABNORMAL LOW (ref 3.5–5.0)
Albumin: 2.5 g/dL — ABNORMAL LOW (ref 3.5–5.0)
Anion gap: 6 (ref 5–15)
Anion gap: 6 (ref 5–15)
BUN: 80 mg/dL — ABNORMAL HIGH (ref 8–23)
BUN: 79 mg/dL — ABNORMAL HIGH (ref 8–23)
CO2: 25 mmol/L (ref 22–32)
CO2: 24 mmol/L (ref 22–32)
Calcium: 9.3 mg/dL (ref 8.9–10.3)
Calcium: 9.3 mg/dL (ref 8.9–10.3)
Chloride: 109 mmol/L (ref 98–111)
Chloride: 107 mmol/L (ref 98–111)
Creatinine, Ser: 2.45 mg/dL — ABNORMAL HIGH (ref 0.44–1.00)
Creatinine, Ser: 2.32 mg/dL — ABNORMAL HIGH (ref 0.44–1.00)
GFR, Estimated: 19 mL/min — ABNORMAL LOW (ref >60)
GFR, Estimated: 21 mL/min — ABNORMAL LOW (ref >60)
Glucose, Bld: 104 mg/dL — ABNORMAL HIGH (ref 70–99)
Glucose, Bld: 90 mg/dL (ref 70–99)
Phosphorus: 2.7 mg/dL (ref 2.5–4.6)
Phosphorus: 2.8 mg/dL (ref 2.5–4.6)
Potassium: 5.9 mmol/L — ABNORMAL HIGH (ref 3.5–5.1)
Potassium: 5.5 mmol/L — ABNORMAL HIGH (ref 3.5–5.1)
Sodium: 140 mmol/L (ref 135–145)
Sodium: 137 mmol/L (ref 135–145)

## 2021-07-23 LAB — TYPE AND SCREEN
ABO/RH(D): A POS
Antibody Screen: NEGATIVE
Unit division: 0
Unit division: 0

## 2021-07-23 LAB — CBC
HCT: 24.3 % — ABNORMAL LOW (ref 36.0–46.0)
HCT: 26.1 % — ABNORMAL LOW (ref 36.0–46.0)
Hemoglobin: 8.2 g/dL — ABNORMAL LOW (ref 12.0–15.0)
Hemoglobin: 8.5 g/dL — ABNORMAL LOW (ref 12.0–15.0)
MCH: 29.3 pg (ref 26.0–34.0)
MCH: 29 pg (ref 26.0–34.0)
MCHC: 33.7 g/dL (ref 30.0–36.0)
MCHC: 32.6 g/dL (ref 30.0–36.0)
MCV: 86.8 fL (ref 80.0–100.0)
MCV: 89.1 fL (ref 80.0–100.0)
Platelets: 231 10*3/uL (ref 150–400)
Platelets: 260 10*3/uL (ref 150–400)
RBC: 2.8 MIL/uL — ABNORMAL LOW (ref 3.87–5.11)
RBC: 2.93 MIL/uL — ABNORMAL LOW (ref 3.87–5.11)
RDW: 20.6 % — ABNORMAL HIGH (ref 11.5–15.5)
RDW: 21.4 % — ABNORMAL HIGH (ref 11.5–15.5)
WBC: 10.2 10*3/uL (ref 4.0–10.5)
WBC: 10.1 10*3/uL (ref 4.0–10.5)
nRBC: 0.9 % — ABNORMAL HIGH (ref 0.0–0.2)
nRBC: 0.8 % — ABNORMAL HIGH (ref 0.0–0.2)

## 2021-07-23 LAB — BPAM RBC
Blood Product Expiration Date: 202211072359
Blood Product Expiration Date: 202211072359
ISSUE DATE / TIME: 202210261439
ISSUE DATE / TIME: 202210262000
Unit Type and Rh: 6200
Unit Type and Rh: 6200

## 2021-07-23 LAB — APTT: aPTT: 33 seconds (ref 24–36)

## 2021-07-23 LAB — MAGNESIUM: Magnesium: 2.4 mg/dL (ref 1.7–2.4)

## 2021-07-23 LAB — PROTIME-INR
INR: 1.1 (ref 0.8–1.2)
Prothrombin Time: 14 seconds (ref 11.4–15.2)

## 2021-07-23 LAB — POTASSIUM
Potassium: 5.7 mmol/L — ABNORMAL HIGH (ref 3.5–5.1)
Potassium: 5 mmol/L (ref 3.5–5.1)

## 2021-07-23 LAB — BRAIN NATRIURETIC PEPTIDE: B Natriuretic Peptide: 129.8 pg/mL — ABNORMAL HIGH (ref 0.0–100.0)

## 2021-07-23 MED ORDER — SODIUM ZIRCONIUM CYCLOSILICATE 10 G PO PACK
10.0000 g | PACK | Freq: Once | ORAL | Status: AC
  Administered 2021-07-23: 10 g via ORAL
  Filled 2021-07-23: qty 1

## 2021-07-23 MED ORDER — SODIUM ZIRCONIUM CYCLOSILICATE 10 G PO PACK: 10.0000 g | PACK | Freq: Once | ORAL | Status: DC

## 2021-07-23 NOTE — Progress Notes (Signed)
PROGRESS NOTE  Leslie Reeves RDE:[id number] DOB: Dec 18, 1940   PCP: [name], FNP  Patient is from: Home.  Lives with family.  Independent with ADLs at baseline.  DOA: 07/15/2021 LOS: 6  Chief complaints:  No chief complaint on file.    Brief Narrative / Interim history: 80 year old F with PMH of COPD, diastolic CHF, CVA, CKD-4 and prediabetes admitted from cardiology office with concern CHF exacerbation after she presented with progressive shortness of breath and DOE for 1 week.  Initially admitted by cardiology and started on IV Lasix, and developed AKI.  Patient's symptoms felt to be due to symptomatic iron deficiency anemia versus CHF exacerbation.  Hemoccult, SPEP and M spike negative.  She was transferred to [facility] ([place] service on 07/17/2021.  She had blood transfusion with appropriate response.  She also had iron infusion.  However, patient had right hip pain after accidental fall on 10/23.  CT showed large hematoma measuring 13.7 cm in the largest dimension in the gluteal compartment.  Pharmacologic VTE prophylaxis discontinued.  Repeat Hgb on 10/26 down to 5.6.  Transfused 2 more units with appropriate response.  H&H seems to be stable.  Subjective: Seen and examined earlier this morning.  No major events overnight of this morning.  No complaints.  She denies pain, shortness of breath, GI or UTI symptoms.  She had hyperkalemia to 5.9.  She had 10 g Lokelma earlier this morning.  Objective: Vitals:   07/23/21 0603 07/23/21 0700 07/23/21 0940 07/23/21 1136  BP: (!) 118/54 (!) 129/51  (!) 128/59  Pulse: 77 67  76  Resp: 20   19  Temp: 98.7 F (37.1 C) 99.1 F (37.3 C)  98.6 F (37 C)  TempSrc: Oral Oral  Oral  SpO2: 96% 94% 92% 94%  Weight:      Height:        Intake/Output Summary (Last 24 hours) at 07/23/2021 1412 Last data filed at 07/23/2021 1317 Gross per 24 hour  Intake 1047 ml  Output 600 ml  Net 447 ml   Filed Weights   07/21/21 0233 07/22/21 0037 07/23/21 0449   Weight: 46.5 kg 44.7 kg 43.8 kg    Examination:  GENERAL: Frail looking elderly female.  No apparent distress. HEENT: MMM.  Vision and hearing grossly intact.  NECK: Supple.  No apparent JVD.  RESP:  No IWOB.  Fair aeration bilaterally. CVS:  RRR. Heart sounds normal.  ABD/GI/GU: BS+. Abd soft, NTND.  MSK/EXT:  Moves extremities.  Slight tenderness and swelling over right backside.  Significant muscle mass and subcu fat loss. SKIN: Lipoma over left upper back.  No bruising or ecchymosis NEURO: Sleepy but wakes to voice easily.  Fairly oriented.  No apparent focal neuro deficit. PSYCH: Calm. Normal affect.   Procedures:  None  Microbiology summarized: [id number] and influenza PCR nonreactive.  Assessment & Plan: Acute blood loss anemia superimposed on symptomatic iron deficiency anemia-patient has ABLA due to right gluteal hematoma from fall in the hospital on 10/23.  Hemoccult and SPEP negative.  No M spike.  Initially transfused 1 unit on 10/21 and 2 units on 10/26.  H&H seems to be stable. Recent Labs    06/11/21 1921 07/15/21 2012 07/16/21 0345 07/17/21 0324 07/17/21 1841 07/18/21 2637 07/20/21 0436 07/22/21 0841 07/23/21 0133 07/23/21 0621  HGB 11.1* 8.0* 7.7* 7.7* 9.4* 9.4* 8.4* 5.6* 8.2* 8.5*  -Status post 4 doses of ferric gluconate -SCD for VTE prophylaxis. -Follow UPEP -Recheck H&H in the morning.  Chronic diastolic CHF:  TTE with LVEF of 65 to 70%, G1-DD.  Appears euvolemic on exam.  Patient's symptoms likely symptomatic anemia versus CHF exacerbation. -Diuretics on hold due to AKI -Continue Lasix as needed on discharge -Monitor fluid status, renal functions and electrolytes.  Right hip/gluteal hematoma due to accidental fall in the hospital: CTA with large hematoma measuring 13.5 cm but no report of extravasation. -May need repeat CTA if H&H drops again -Fall precaution -SCD for VTE prophylaxis   Acute on CKD stage IV/azotemia: Cr seems to be plateauing.  CK normal. Followed by Dr. Royce Macadamia with [facility].  Recent Labs    06/13/21 0428 06/18/21 1231 07/15/21 2012 07/16/21 0345 07/17/21 0324 07/18/21 8676 07/20/21 0436 07/22/21 0841 07/23/21 0133 07/23/21 0621  BUN 34* 55* 85* 82* 77* 68* 70* 77* 80* 79*  CREATININE 1.97* 2.28* 2.47* 2.33* 2.54* 2.21* 2.39* 2.33* 2.45* 2.32*  -Diuretics discontinued -Recheck in the morning -Renal ultrasound if no improvement.  Hyperkalemia: K5.9.  Received Lokelma earlier this morning but too early to see the effect -Recheck K this afternoon.   Chronic COPD: Stable -Continue inhalers.   Generalized weakness/physical deconditioning -Order home health PT/OT as recommended by therapy.   Hypothyroidism: TSH 1.6. -Continue Synthroid  Severe malnutrition/failure to thrive in adult-significant muscle mass and subcu fat loss. Body mass index is 18.25 kg/m. Nutrition Problem: Severe Malnutrition Etiology: chronic illness (COPD, CHF) Signs/Symptoms: severe fat depletion, severe muscle depletion Interventions: Ensure Enlive (each supplement provides 350kcal and 20 grams of protein), MVI   DVT prophylaxis:  Place and maintain sequential compression device Start: 07/21/21 0953  Code Status: Full code Family Communication: Updated patient's daughter over the phone. Level of care: Telemetry Cardiac Status is: Inpatient  Remains inpatient appropriate because: Electrolyte abnormality  Consultants:  Cardiology   Sch Meds:  Scheduled Meds:  feeding supplement  237 mL Oral BID BM   isosorbide-hydrALAZINE  1 tablet Oral TID   levothyroxine  37.5 mcg Oral Q0600   mouth rinse  15 mL Mouth Rinse BID   megestrol  600 mg Oral Daily   mirtazapine  15 mg Oral QHS   mometasone-formoterol  2 puff Inhalation BID   multivitamin with minerals  1 tablet Oral Daily   rosuvastatin  10 mg Oral Daily   Continuous Infusions: PRN Meds:.acetaminophen, albuterol, nitroGLYCERIN, ondansetron (ZOFRAN)  IV  Antimicrobials: Anti-infectives (From admission, onward)    None        I have personally reviewed the following labs and images: CBC: Recent Labs  Lab 07/18/21 0642 07/20/21 0436 07/22/21 0841 07/23/21 0133 07/23/21 0621  WBC 6.0 7.0 9.7 10.2 10.1  HGB 9.4* 8.4* 5.6* 8.2* 8.5*  HCT 29.3* 26.5* 17.9* 24.3* 26.1*  MCV 84.0 86.9 90.4 86.8 89.1  PLT 284 288 277 231 260   BMP &GFR Recent Labs  Lab 07/18/21 0642 07/20/21 0436 07/22/21 0841 07/23/21 0133 07/23/21 0621  NA 142 144 142 140 137  K 4.3 5.0 5.6* 5.9* 5.5*  5.7*  CL 113* 112* 113* 109 107  CO2 _0 GLUCOSE 103* 110* 100* 104* 90  BUN 68* 70* 77* 80* 79*  CREATININE 2.21* 2.39* 2.33* 2.45* 2.32*  CALCIUM 9.0 9.1 9.3 9.3 9.3  MG  --   --  2.3 2.4  --   PHOS  --   --  2.5 2.7 2.8   Estimated Creatinine Clearance: 13.4 mL/min (A) (by C-G formula based on SCr of 2.32 mg/dL (H)). Liver & Pancreas: Recent Labs  Lab 07/22/21  8315 07/23/21 0133 07/23/21 0621  ALBUMIN 2.4* 2.3* 2.5*   No results for input(s): LIPASE, AMYLASE in the last 168 hours. No results for input(s): AMMONIA in the last 168 hours. Diabetic: No results for input(s): HGBA1C in the last 72 hours. No results for input(s): GLUCAP in the last 168 hours. Cardiac Enzymes: Recent Labs  Lab 07/22/21 0841  CKTOTAL 130   No results for input(s): PROBNP in the last 8760 hours. Coagulation Profile: Recent Labs  Lab 07/23/21 0133  INR 1.1   Thyroid Function Tests: No results for input(s): TSH, T4TOTAL, FREET4, T3FREE, THYROIDAB in the last 72 hours. Lipid Profile: No results for input(s): CHOL, HDL, LDLCALC, TRIG, CHOLHDL, LDLDIRECT in the last 72 hours. Anemia Panel: No results for input(s): VITAMINB12, FOLATE, FERRITIN, TIBC, IRON, RETICCTPCT in the last 72 hours. Urine analysis:    Component Value Date/Time   COLORURINE YELLOW 07/17/2021 [place] 07/17/2021 1434   LABSPEC 1.014 07/17/2021 1434    PHURINE 5.0 07/17/2021 1434   GLUCOSEU NEGATIVE 07/17/2021 1434   [place] 07/17/2021 [place] 07/17/2021 1434   BILIRUBINUR Negative 07/13/2021 [place] 07/17/2021 1434   PROTEINUR 100 (A) 07/17/2021 1434   UROBILINOGEN 0.2 07/13/2021 1654   NITRITE NEGATIVE 07/17/2021 1434   LEUKOCYTESUR NEGATIVE 07/17/2021 1434   Sepsis Labs: Invalid input(s): PROCALCITONIN, [place]  Microbiology: Recent Results (from the past 240 hour(s))  Resp Panel by RT-PCR (Flu A&B, Covid) Nasopharyngeal Swab     Status: None   Collection Time: 07/15/21  6:23 PM   Specimen: Nasopharyngeal Swab; Nasopharyngeal(NP) swabs in vial transport medium  Result Value Ref Range Status   SARS Coronavirus 2 by RT PCR NEGATIVE NEGATIVE Final    Comment: (NOTE) SARS-CoV-2 target nucleic acids are NOT DETECTED.  The SARS-CoV-2 RNA is generally detectable in upper respiratory specimens during the acute phase of infection. The lowest concentration of SARS-CoV-2 viral copies this assay can detect is 138 copies/mL. A negative result does not preclude SARS-Cov-2 infection and should not be used as the sole basis for treatment or other patient management decisions. A negative result may occur with  improper specimen collection/handling, submission of specimen other than nasopharyngeal swab, presence of viral mutation(s) within the areas targeted by this assay, and inadequate number of viral copies(<138 copies/mL). A negative result must be combined with clinical observations, patient history, and epidemiological information. The expected result is Negative.  Fact Sheet for Patients:  EntrepreneurPulse.com.au  Fact Sheet for Healthcare Providers:  IncredibleEmployment.be  This test is no t yet approved or cleared by the [place] FDA and  has been authorized for detection and/or diagnosis of SARS-CoV-2 by FDA under an Emergency Use  Authorization (EUA). This EUA will remain  in effect (meaning this test can be used) for the duration of the COVID-19 declaration under Section 564(b)(1) of the Act, 21 U.S.C.section 360bbb-3(b)(1), unless the authorization is terminated  or revoked sooner.       Influenza A by PCR NEGATIVE NEGATIVE Final   Influenza B by PCR NEGATIVE NEGATIVE Final    Comment: (NOTE) The Xpert Xpress SARS-CoV-2/FLU/RSV plus assay is intended as an aid in the diagnosis of influenza from Nasopharyngeal swab specimens and should not be used as a sole basis for treatment. Nasal washings and aspirates are unacceptable for Xpert Xpress SARS-CoV-2/FLU/RSV testing.  Fact Sheet for Patients: EntrepreneurPulse.com.au  Fact Sheet for Healthcare Providers: IncredibleEmployment.be  This test is not yet approved or cleared by the  [place] Architectural technologist and has been authorized for detection and/or diagnosis of SARS-CoV-2 by FDA under an Print production planner (EUA). This EUA will remain in effect (meaning this test can be used) for the duration of the COVID-19 declaration under Section 564(b)(1) of the Act, 21 U.S.C. section 360bbb-3(b)(1), unless the authorization is terminated or revoked.  Performed at [facility], [place] [address] [place], [place] [address]     Radiology Studies: No results found.    Vivion Romano T. [place]  If 7PM-7AM, please contact night-coverage [email or web address] 07/23/2021, 2:12 PM

## 2021-07-23 NOTE — Progress Notes (Signed)
Ordering Lokelma and K lab Q4H

## 2021-07-23 NOTE — Progress Notes (Signed)
EKG done, 1st set of q1hr VS done- O2 sats 85% on RA, pt placed on 1L via nasal cannula- sats 92%. Lokelma given.

## 2021-07-23 NOTE — Progress Notes (Signed)
Nutrition Follow-up  DOCUMENTATION CODES:  Underweight, Severe malnutrition in context of chronic illness  INTERVENTION:  Continue Ensure TID.  Continue MVI with minerals daily.  Continue to encourage PO intake.  NUTRITION DIAGNOSIS:  Severe Malnutrition related to chronic illness (COPD, CHF) as evidenced by severe fat depletion, severe muscle depletion. - ongoing  GOAL:  Patient will meet greater than or equal to 90% of their needs - progressing  MONITOR:  PO intake, Supplement acceptance, Labs, Weight trends, Skin, I & O's  REASON FOR ASSESSMENT:  Consult Assessment of nutrition requirement/status  ASSESSMENT:  Leslie Reeves is a 80 y.o. female with severe COPD, chronic diastolic congestive heart failure who is being seen 07/15/2021 for the evaluation of progressive shortness of breath, weakness and failure to thrive..  Pt with acute blood loss anemia requiring blood transfusions.  Per Epic, pt has eaten an average of 76% over the last 8 meals (25-100%).  Admit wt: 40.2 kg Current wt: 43.8 kg  Spoke with pt at bedside briefly. She reports feeling better and having more of an appetite, eating 100% of the last 3 meals documented.  She reports liking the supplements she has been receiving and is looking forward to going home soon.  Continue current nutrition plan.   Supplements: Ensure BID  Medications: reviewed; Synthroid, Megace, Remeron, MVI with minerals  Labs: reviewed; K 5.5 (H - Lokelma ordered today), BUN 79 (H), Crt 2.32 (H)  Diet Order:   Diet Order             Diet Heart Room service appropriate? Yes; Fluid consistency: Thin  Diet effective now                  EDUCATION NEEDS:  Education needs have been addressed  Skin:  Skin Assessment: Reviewed RN Assessment  Last BM:  07/20/21  Height:  Ht Readings from Last 1 Encounters:  07/15/21 _0  (1.549 m)   Weight:  Wt Readings from Last 1 Encounters:  07/23/21 43.8 kg   BMI:  Body mass  index is 18.25 kg/m.  Estimated Nutritional Needs:  Kcal:  1440-1640 Protein:  65-80 grams Fluid:  > 1.4 L  Derrel Nip, RD, LDN (she/her/hers) Registered Dietitian I Pager #: [phone] After-Hours/Weekend Pager # in [place]

## 2021-07-23 NOTE — Progress Notes (Signed)
Potassium 5.6, provider notified.

## 2021-07-23 NOTE — Progress Notes (Signed)
Occupational Therapy Treatment Patient Details Name: Leslie Reeves MRN: [id number] DOB: 08-May-1941 Today's Date: 07/23/2021   History of present illness 80 year old female with COPD, CVA, chronic diastolic CHF, prediabetes, CKD , hypothyroidism presented as a direct admission from cardiology office on 10/19 for shortness of breath suspected to be CHF exacerbation but pt found to have symptomatic anemia with Hgb of 7.7. Pt with fall in hospital 10/23 and c/o rt hip pain on 10/24. Imaging of rt hip negative.   OT comments  Pt with slow progress towards OT goals with progressive balance and cardiopulmonary tolerance deficits. Pt requires Min A for steadying in static standing (reliant on BUE support currently though pt did not use AD at baseline). Pt requirs Min A for safe completion of LB ADLs in standing due to frequent posterior LOB and unable to safely progress mobility today. Pt reports her daughter will be able to provide physical assist at home. Will continue to follow acutely and monitor progress with therapy.  Pt received on 1 L O2, SpO2 89% at rest, 86% with activity and 2/4 DOE with activity. Increased to 2 L O2 to sustain at 90%. HR 80s.    Recommendations for follow up therapy are one component of a multi-disciplinary discharge planning process, led by the attending physician.  Recommendations may be updated based on patient status, additional functional criteria and insurance authorization.    Follow Up Recommendations  Home health OT    Assistance Recommended at Discharge Intermittent Supervision/Assistance  Equipment Recommendations  Tub/shower bench    Recommendations for Other Services      Precautions / Restrictions Precautions Precautions: Fall Precaution Comments: monitor O2, fall during this admission Restrictions Weight Bearing Restrictions: No       Mobility Bed Mobility Overal bed mobility: Needs Assistance Bed Mobility: Supine to Sit;Sit to Supine      Supine to sit: Min assist;HOB elevated Sit to supine: Min assist;HOB elevated   General bed mobility comments: Light Min A to assist R LE to EOB and back into bed.    Transfers Overall transfer level: Needs assistance Equipment used: Rolling walker (2 wheels) Transfers: Sit to/from Stand Sit to Stand: Min assist           General transfer comment: Pt required MIn A to power up from bedside x 2-3 for vitals assessment and LB ADLs. Assist to steady needed with posterior LOB     Balance Overall balance assessment: Needs assistance;History of Falls Sitting-balance support: Feet supported;No upper extremity supported Sitting balance-Leahy Scale: Fair   Postural control: Posterior lean Standing balance support: Bilateral upper extremity supported Standing balance-Leahy Scale: Poor Standing balance comment: reliant on either BUE support with static standing or external support and one UE support in standing, posterior LOB without                           ADL either performed or assessed with clinical judgement   ADL Overall ADL's : Needs assistance/impaired Eating/Feeding: Independent;Bed level           Lower Body Bathing: Minimal assistance;Sit to/from stand Lower Body Bathing Details (indicate cue type and reason): Min A to bathe peri region in standing, unable to maintain standing balance without external support or B UE support Upper Body Dressing : Set up;Sitting Upper Body Dressing Details (indicate cue type and reason): to don clean gown (assist with lines)  General ADL Comments: Pt noted with cardiopulmonary deficits and increased standing balance deficits reliant on BUE support in standing or external assist. Pt typically does not use AD at baseline. Unable to mobilize from bedside due to inability to safely correct balance     Vision   Vision Assessment?: No apparent visual deficits   Perception     Praxis       Cognition Arousal/Alertness: Awake/alert Behavior During Therapy: WFL for tasks assessed/performed Overall Cognitive Status: History of cognitive impairments - at baseline                                 General Comments: Pt pleasant, hx of memory deficits. able to follow directions consistently and show inight into physical deficits          Exercises     Shoulder Instructions       General Comments SpO2 86-89% on 1 L O2, increased to 2 L O2 to maintain at low 90s    Pertinent Vitals/ Pain       Pain Assessment: Faces Faces Pain Scale: Hurts little more Pain Location: R hip with ROM Pain Descriptors / Indicators: Guarding Pain Intervention(s): Monitored during session;Limited activity within patient's tolerance;Repositioned  Home Living                                          Prior Functioning/Environment              Frequency  Min 2X/week        Progress Toward Goals  OT Goals(current goals can now be found in the care plan section)  Progress towards OT goals: OT to reassess next treatment  Acute Rehab OT Goals OT Goal Formulation: With patient Time For Goal Achievement: 07/31/21 Potential to Achieve Goals: Good ADL Goals Pt Will Perform Grooming: with supervision;standing Pt Will Perform Upper Body Bathing: with supervision;sitting Pt Will Perform Upper Body Dressing: with supervision;sitting Pt Will Transfer to Toilet: with supervision;ambulating;regular height toilet Pt Will Perform Toileting - Clothing Manipulation and hygiene: with supervision;sit to/from stand  Plan Discharge plan remains appropriate    Co-evaluation                 AM-PAC OT "6 Clicks" Daily Activity     Outcome Measure   Help from another person eating meals?: None Help from another person taking care of personal grooming?: A Little Help from another person toileting, which includes using toliet, bedpan, or urinal?: A Little Help  from another person bathing (including washing, rinsing, drying)?: A Lot Help from another person to put on and taking off regular upper body clothing?: A Little Help from another person to put on and taking off regular lower body clothing?: A Lot 6 Click Score: 17    End of Session Equipment Utilized During Treatment: Gait belt;Rolling walker (2 wheels)  OT Visit Diagnosis: Unsteadiness on feet (R26.81);Muscle weakness (generalized) (M62.81)   Activity Tolerance Patient limited by fatigue   Patient Left in bed;with call bell/phone within reach;with bed alarm set   Nurse Communication Mobility status;Other (comment) (O2, balance deficits)        Time: 2694-8546 OT Time Calculation (min): 21 min  Charges: OT General Charges $OT Visit: 1 Visit OT Treatments $Self Care/Home Management : 8-22 mins  Malachy Chamber, OTR/L Acute Rehab Services Office:  [place] 07/23/2021, 11:50 AM

## 2021-07-24 ENCOUNTER — Ambulatory Visit: Payer: Medicare Other

## 2021-07-24 ENCOUNTER — Encounter: Payer: Self-pay | Admitting: Nurse Practitioner

## 2021-07-24 DIAGNOSIS — R5381 Other malaise: Secondary | ICD-10-CM

## 2021-07-24 DIAGNOSIS — D62 Acute posthemorrhagic anemia: Secondary | ICD-10-CM | POA: Diagnosis not present

## 2021-07-24 DIAGNOSIS — E43 Unspecified severe protein-calorie malnutrition: Secondary | ICD-10-CM | POA: Diagnosis not present

## 2021-07-24 DIAGNOSIS — J9601 Acute respiratory failure with hypoxia: Secondary | ICD-10-CM

## 2021-07-24 DIAGNOSIS — I5032 Chronic diastolic (congestive) heart failure: Secondary | ICD-10-CM | POA: Diagnosis not present

## 2021-07-24 DIAGNOSIS — D649 Anemia, unspecified: Secondary | ICD-10-CM | POA: Diagnosis not present

## 2021-07-24 DIAGNOSIS — D500 Iron deficiency anemia secondary to blood loss (chronic): Secondary | ICD-10-CM

## 2021-07-24 LAB — RENAL FUNCTION PANEL
Albumin: 2.4 g/dL — ABNORMAL LOW (ref 3.5–5.0)
Anion gap: 5 (ref 5–15)
BUN: 79 mg/dL — ABNORMAL HIGH (ref 8–23)
CO2: 25 mmol/L (ref 22–32)
Calcium: 9.4 mg/dL (ref 8.9–10.3)
Chloride: 110 mmol/L (ref 98–111)
Creatinine, Ser: 2.43 mg/dL — ABNORMAL HIGH (ref 0.44–1.00)
GFR, Estimated: 20 mL/min — ABNORMAL LOW (ref >60)
Glucose, Bld: 124 mg/dL — ABNORMAL HIGH (ref 70–99)
Phosphorus: 2.9 mg/dL (ref 2.5–4.6)
Potassium: 4.8 mmol/L (ref 3.5–5.1)
Sodium: 140 mmol/L (ref 135–145)

## 2021-07-24 LAB — MAGNESIUM: Magnesium: 2.3 mg/dL (ref 1.7–2.4)

## 2021-07-24 LAB — HEMOGLOBIN AND HEMATOCRIT, BLOOD
HCT: 25.3 % — ABNORMAL LOW (ref 36.0–46.0)
Hemoglobin: 8.2 g/dL — ABNORMAL LOW (ref 12.0–15.0)

## 2021-07-24 MED ORDER — ACETAMINOPHEN 325 MG PO TABS: 650.0000 mg | ORAL_TABLET | Freq: Four times a day (QID) | ORAL | Status: AC | PRN

## 2021-07-24 MED ORDER — ADULT MULTIVITAMIN W/MINERALS CH: 1.0000 | ORAL_TABLET | Freq: Every day | ORAL | Status: DC

## 2021-07-24 NOTE — Progress Notes (Signed)
   07/24/21 1200  Mobility  Activity Refused mobility (Just received lunch declined mobility stating she was going home.)

## 2021-07-24 NOTE — Discharge Summary (Signed)
Physician Discharge Summary  Leslie Reeves JIR:[id number] DOB: [birth year] DOA: 07/15/2021  PCP: [name], FNP  Admit date: 07/15/2021 Discharge date: 07/24/2021 Admitted From: Home Disposition: Home Recommendations for Outpatient Follow-up:  Follow ups as below. Please obtain CBC/BMP/Mag at follow up Please follow up on the following pending results: None Home Health: PT/OT Equipment/Devices: Shower seat and home oxygen Discharge Condition: Stable but guarded prognosis CODE STATUS: Full code   Follow-up Information     [name], [place]. Go on 07/29/2021.   Specialty: General Practice Why: _0 :[place] information: [address] [place] [address] Iselin, Mary E, MD .   Specialty: Cardiology Contact information: [address] [place] [address] [phone]         [name], MD. Schedule an appointment as soon as possible for a visit in 2 week(s).   Specialty: Nephrology Contact information: [place] [address] [phone]         [facility] Follow up.   Specialty: Home Health Services Why: They will call you with apt times. Contact information: [address] [place] [address] [phone]         [facility] Follow up.   Why: tub bench, home oxygen Contact information: [address] [place] [address] [phone]                Hospital Course: 80 year old F with PMH of COPD, diastolic CHF, CVA, CKD-4 and prediabetes admitted from cardiology office with concern CHF exacerbation after she presented with progressive shortness of breath and DOE for 1 week.  Initially admitted by cardiology and started on IV Lasix, and developed AKI.  Patient's symptoms felt to be due to symptomatic iron deficiency anemia versus CHF exacerbation.  Patient is on full dose aspirin.  Hemoccult, SPEP, UPEP and M spike negative.  She was  transferred to [facility] service on 07/17/2021.  She had blood transfusion with appropriate response.  She also had iron infusion x4.  However, patient had right hip pain after accidental fall on 07/19/21.  CT showed large hematoma measuring 13.7 cm in the largest dimension in the gluteal compartment but no acute osseous abnormalities.  Pharmacologic VTE prophylaxis discontinued.  Repeat Hgb on 10/26 down to 5.6.  Transfused 2 more units with appropriate response.  H&H remained stable after that.  Aspirin discontinued on discharge.  Patient was ambulated on room air and desaturated requiring 3 L to maintain saturation in mid 90s.  Home health PT/OT and DME ordered as recommended by therapy.   See individual problem list below for more on hospital course.  Discharge Diagnoses:  Acute blood loss anemia superimposed on symptomatic iron deficiency anemia-patient has ABLA due to right gluteal hematoma from fall in the hospital on 10/23.  Hemoccult, SPEP, UPEP and M spike negative.  Initially transfused 1 unit on 10/21 and 2 units on 10/26.  H&H stable. Recent Labs    07/15/21 2012 07/16/21 0345 07/17/21 0324 07/17/21 1841 07/18/21 8099 07/20/21 0436 07/22/21 0841 07/23/21 0133 07/23/21 0621 07/24/21 0118  HGB 8.0* 7.7* 7.7* 9.4* 9.4* 8.4* 5.6* 8.2* 8.5* 8.2*  -Discontinued aspirin on discharge -Recheck CBC at follow-up    Chronic diastolic CHF: TTE with LVEF of 65 to 70%, G1-DD.  Appears euvolemic on exam.  Patient's symptoms likely symptomatic anemia versus CHF exacerbation.  Remained stable off p.o. diuretics. -Continue holding diuretics -Reassess fluid  status and renal function at follow-up.   Acute respiratory failure with hypoxia: Desaturated with ambulation on room air requiring 3 L.  She appears euvolemic.  Most likely atelectasis from lying in bed continuously. -Discharged on 3 L  Right hip/gluteal hematoma due to accidental fall in the hospital: CTA with large hematoma measuring 13.5 cm  but no report of extravasation.  H&H stable.   Acute on CKD stage IV/azotemia: Cr seems to be plateauing. CK normal. Followed by Dr. Royce Macadamia with [facility].  Recent Labs    06/18/21 1231 07/15/21 2012 07/16/21 0345 07/17/21 0324 07/18/21 7846 07/20/21 0436 07/22/21 0841 07/23/21 0133 07/23/21 0621 07/24/21 0118  BUN 55* 85* 82* 77* 68* 70* 77* 80* 79* 79*  CREATININE 2.28* 2.47* 2.33* 2.54* 2.21* 2.39* 2.33* 2.45* 2.32* 2.43*  -Discontinue Lasix   Hyperkalemia: Resolved.   Chronic COPD: Stable -Continue inhalers.   Generalized weakness/physical deconditioning -HH PT/OT ordered.   Hypothyroidism: TSH 1.6. -Continue Synthroid   Severe malnutrition/failure to thrive in adult-significant muscle mass and subcu fat loss. Body mass index is 18.25 kg/m. Nutrition Problem: Severe Malnutrition Etiology: chronic illness (COPD, CHF) Signs/Symptoms: severe fat depletion, severe muscle depletion Interventions: Ensure Enlive (each supplement provides 350kcal and 20 grams of protein), MVI     Discharge Exam: Vitals:   07/24/21 0452 07/24/21 0500 07/24/21 0810 07/24/21 1057  BP: 118/64     Pulse: 80     Temp: 99.1 F (37.3 C)     Resp: 18     Height:      Weight:  43.8 kg    SpO2: 91%  94% 90%  TempSrc: Oral     BMI (Calculated):  18.25       GENERAL: Frail and chronically ill-appearing. HEENT: MMM.  Vision and hearing grossly intact.  NECK: Supple.  No apparent JVD.  RESP: 94% on RA at rest.  No IWOB.  Fair aeration bilaterally. CVS:  RRR. Heart sounds normal.  ABD/GI/GU: Bowel sounds present. Soft. Non tender.  MSK/EXT:  Moves extremities.  Significant muscle mass and subcu fat loss. SKIN: Lipoma over left upper back.  NEURO: Awake and alert.  Oriented appropriately.  No apparent focal neuro deficit. PSYCH: Calm. Normal affect.   Discharge Instructions  Discharge Instructions     (HEART FAILURE PATIENTS) Call MD:  Anytime you have any of the following symptoms: 1) 3  pound weight gain in 24 hours or 5 pounds in 1 week 2) shortness of breath, with or without a dry hacking cough 3) swelling in the hands, feet or stomach 4) if you have to sleep on extra pillows at night in order to breathe.   Complete by: As directed    Call MD for:  difficulty breathing, headache or visual disturbances   Complete by: As directed    Call MD for:  extreme fatigue   Complete by: As directed    Call MD for:  persistant dizziness or light-headedness   Complete by: As directed    Call MD for:  severe uncontrolled pain   Complete by: As directed    Diet - low sodium heart healthy   Complete by: As directed    Discharge instructions   Complete by: As directed    It has been a pleasure taking care of you!  You were hospitalized with shortness of breath mainly due to anemia.  You have been treated with blood transfusion and iron infusion.  We have stopped your aspirin.  We have also stopped the Lasix (  furosemide) until you have your kidney numbers rechecked.  Please review your new medication list and the directions on your medications before you take them.  Follow-up with your primary care doctor, nephrologist and cardiologist in 1 to 2 weeks.  In addition to taking your medications as prescribed, we also recommend you avoid alcohol or over-the-counter pain medication other than plain Tylenol, limit the amount of water/fluid you drink to less than 6 cups (1500 cc) a day,  limit your sodium (salt) intake to less than 2 g (2000 mg) a day and weigh yourself daily at the same time and keeping your weight log.     Take care,   Increase activity slowly   Complete by: As directed       Allergies as of 07/24/2021       Reactions   Penicillin G Rash   Penicillins Rash        Medication List     STOP taking these medications    amLODipine 10 MG tablet Commonly known as: NORVASC   aspirin 325 MG tablet Commonly known as: Bayer Aspirin   furosemide 40 MG tablet Commonly  known as: LASIX   mometasone-formoterol 100-5 MCG/ACT Aero Commonly known as: DULERA   potassium chloride SA 20 MEQ tablet Commonly known as: KLOR-CON   VITAMIN B COMPLEX PO       TAKE these medications    Accu-Chek Softclix Lancets lancets Use as instructed   acetaminophen 325 MG tablet Commonly known as: TYLENOL Take 2 tablets (650 mg total) by mouth every 6 (six) hours as needed for headache or mild pain.   albuterol 108 (90 Base) MCG/ACT inhaler Commonly known as: VENTOLIN HFA INHALE 2 PUFFS BY MOUTH 4 TIMES A DAY What changed:  how much to take how to take this when to take this reasons to take this additional instructions   blood glucose meter kit and supplies Kit Dispense based on patient and insurance preference. Use up to four times daily as directed. (FOR ICD-9 250.00, 250.01).   diclofenac sodium 1 % Gel Commonly known as: VOLTAREN Apply 2 g topically 4 (four) times daily.   fluticasone-salmeterol 100-50 MCG/ACT Aepb Commonly known as: Wixela Inhub TAKE 1 PUFF BY MOUTH EVERY 12 HOURS IN THE MORNING AND IN THE EVENING What changed:  how much to take how to take this when to take this additional instructions   glucose blood test strip Commonly known as: Accu-Chek Aviva Plus Use as instructed   isosorbide-hydrALAZINE 20-37.5 MG tablet Commonly known as: BIDIL Take 1 tablet by mouth 3 (three) times daily.   levothyroxine 25 MCG tablet Commonly known as: SYNTHROID Take 1.5 tablets (37.5 mcg total) by mouth daily before breakfast.   megestrol 625 MG/5ML suspension Commonly known as: MEGACE ES TAKE 5 MLS (625 MG TOTAL) BY MOUTH DAILY.   mirtazapine 15 MG tablet Commonly known as: REMERON TAKE 1 TABLET BY MOUTH EVERYDAY AT BEDTIME What changed: See the new instructions.   multivitamin with minerals Tabs tablet Take 1 tablet by mouth daily.   rosuvastatin 20 MG tablet Commonly known as: CRESTOR TAKE 1 TABLET BY MOUTH EVERY DAY                Durable Medical Equipment  (From admission, onward)           Start     Ordered   07/24/21 1214  For home use only DME oxygen  Once       Question Answer Comment  Length  of Need 6 Months   Mode or (Route) Nasal cannula   Liters per Minute 3   Frequency Continuous (stationary and portable oxygen unit needed)   Oxygen delivery system Gas      07/24/21 1213   07/21/21 1000  For home use only DME Tub bench  Once        07/21/21 1000            Consultations: None  Procedures/Studies: None   CT FEMUR RIGHT WO CONTRAST  Result Date: 07/21/2021 CLINICAL DATA:  Upper leg pain, stress fracture suspected, neg xray had a fall on 10/23, xrays negative for fracture or dislocation, intractable pain, r/o hematoma or occult fracture EXAM: CT OF THE LOWER RIGHT EXTREMITY WITHOUT CONTRAST TECHNIQUE: Multidetector CT imaging of the right lower extremity was performed according to the standard protocol. COMPARISON:  Hip radiograph 07/20/2021. FINDINGS: Bones/Joint/Cartilage There is no evidence of acute fracture or dislocation. There is mild right hip osteoarthritis. Lower lumbar spine degenerative changes. Extensive motion artifact at the level of the knee. Muscles/Tendons and Soft Tissues There is a large heterogeneous collection of mixed high and low density, measuring up to 13.9 x 4.6 cm in the axial dimension just above the hip joint within the gluteal compartment extending along the piriformis towards the pelvis, tracks lateral and posterior to the greater trochanter measuring 9.0 x 3.2 cm axially, and measures 7.7 x 7.2 cm below the hip joint axially (series 5, images 70, 96, and 116). Overall craniocaudal extent spans approximately 13.7 cm (coronal image 123. Extensive additional generalized soft tissue swelling along the right thigh. IMPRESSION: Large heterogeneous collection within the gluteal compartment above and below the level of the hip joint, most likely a hematoma, axial  measurements above. Overall craniocaudal extent spans approximately 13.7 cm. No evidence of acute fracture. Electronically Signed   By: Maurine Simmering M.D.   On: 07/21/2021 11:33   DG Bone Density  Result Date: 07/07/2021 EXAM: DUAL X-RAY ABSORPTIOMETRY (DXA) FOR BONE MINERAL DENSITY IMPRESSION: Referring Physician:  [name] Your patient completed a bone mineral density test using GE Lunar iDXA system (analysis version: 16). Technologist: [place] PATIENT: Name: Leslie Reeves, Leslie Reeves Patient ID:  [id number] Birth Date: [birth year] Height:     61.0 in. Sex:         Female Measured:   07/06/2021 Weight:     98.1 lbs. Indications: Advanced Age, Bilateral Ovariectomy (65.51), Estrogen Deficient, Hysterectomy, Postmenopausal Fractures: None Treatments: None ASSESSMENT: The BMD measured at Forearm Radius 33% is 0.590 g/cm2 with a T-score of -3.4. This patient is considered OSTEOPOROTIC according to [facility]) criteria. The quality of the exam is good. The lumbar spine was excluded due to degenerative changes. Site Region Measured Date Measured Age YA BMD Significant CHANGE T-score Left Forearm Radius 33% 07/06/2021 80.4 -3.4 0.590 g/cm2 DualFemur Neck Right 07/06/2021 80.4 -2.3 0.722 g/cm2 * DualFemur Neck Right 07/03/2018 77.4 -1.4 0.845 g/cm2 DualFemur Total Mean 07/06/2021 80.4 -1.5 0.823 g/cm2 * DualFemur Total Mean 07/03/2018 77.4 -0.8 0.906 g/cm2 World Health Organization [facility]) criteria for post-menopausal, Caucasian Women: Normal       T-score at or above -1 SD Osteopenia   T-score between -1 and -2.5 SD Osteoporosis T-score at or below -2.5 SD RECOMMENDATION: 1. All patients should optimize calcium and vitamin D intake. 2. Consider FDA-approved medical therapies in postmenopausal women and men aged 61 years and older, based on the following: a. A hip or vertebral (clinical or morphometric) fracture. b. T-score = -  2.5 at the femoral neck or spine after appropriate evaluation to exclude secondary  causes. c. Low bone mass (T-score between -1.0 and -2.5 at the femoral neck or spine) and a 10-year probability of a hip fracture = 3% or a 10-year probability of a major osteoporosis-related fracture = 20% based on the [place]-adapted WHO algorithm. d. Clinician judgment and/or patient preferences may indicate treatment for people with 10-year fracture probabilities above or below these levels. FOLLOW-UP: Patients with diagnosis of osteoporosis or at high risk for fracture should have regular bone mineral density tests.? Patients eligible for Medicare are allowed routine testing every 2 years.? The testing frequency can be increased to one year for patients who have rapidly progressing disease, are receiving or discontinuing medical therapy to restore bone mass, or have additional risk factors. I have reviewed this study and agree with the findings. Mark A. Thornton Papas, M.D. [facility] Electronically Signed   By: Lavonia Dana M.D.   On: 07/07/2021 08:23   DG CHEST PORT 1 VIEW  Result Date: 07/15/2021 CLINICAL DATA:  Shortness of breath EXAM: PORTABLE CHEST 1 VIEW COMPARISON:  06/11/2021 FINDINGS: There is hyperinflation of the lungs compatible with COPD. Heart is upper limits normal in size. Aortic atherosclerosis. No confluent airspace opacities or effusions. No acute bony abnormality. IMPRESSION: COPD.  No active disease. Electronically Signed   By: Rolm Baptise M.D.   On: 07/15/2021 20:06   [place] CHEST SOFT TISSUE  Result Date: 07/21/2021 CLINICAL DATA:  Left upper back mass EXAM: ULTRASOUND OF left back SOFT TISSUES TECHNIQUE: Ultrasound examination was performed in the area of clinical concern. COMPARISON:  None. FINDINGS: Targeted ultrasound of the left upper back performed in the region palpable mass. In the region of palpable concern, there is a circumscribed echogenic solid mass with some internal flow, this measures 3.9 x 1.8 x 4.5 cm. IMPRESSION: 4.5 cm solid echogenic slightly vascular mass  within the left upper back corresponding to palpable mass. Findings could represent fatty based lesion, this would be better characterized with MRI. Electronically Signed   By: Donavan Foil M.D.   On: 07/21/2021 23:00   DG HIP UNILAT WITH PELVIS 2-3 VIEWS RIGHT  Result Date: 07/20/2021 CLINICAL DATA:  Right hip pain after fall yesterday. EXAM: DG HIP (WITH OR WITHOUT PELVIS) 2-3V RIGHT COMPARISON:  None. FINDINGS: There is no evidence of hip fracture or dislocation. There is no evidence of arthropathy or other focal bone abnormality. IMPRESSION: Negative. Electronically Signed   By: Marijo Conception M.D.   On: 07/20/2021 10:33   ECHOCARDIOGRAM LIMITED  Result Date: 07/16/2021    ECHOCARDIOGRAM LIMITED REPORT   Patient Name:   Leslie Reeves Date of Exam: 07/16/2021 Medical Rec #:  [id number]      Height:       61.0 in Accession #:    [id number]     Weight:       90.4 lb Date of Birth:  24-Sep-1941       BSA:          1.348 m Patient Age:    45 years       BP:           147/57 mmHg Patient Gender: F              HR:           68 bpm. Exam Location:  Inpatient Procedure: Limited Echo, Limited Color Doppler and Cardiac Doppler Indications:    dyspnea  History:        Patient has prior history of Echocardiogram examinations, most                 recent 06/12/2021. COPD; Risk Factors:Diabetes and Hypertension.  Sonographer:    Johny Chess RDCS Referring Phys: [id number] [place]  1. Left ventricular ejection fraction, by estimation, is 65 to 70%. The left ventricle has no regional wall motion abnormalities. There is mild to moderate left ventricular hypertrophy. Left ventricular diastolic parameters are consistent with Grade I diastolic dysfunction (impaired relaxation).  2. Right ventricular systolic function is normal. The right ventricular size is normal. There is moderately elevated pulmonary artery systolic pressure.  3. The mitral valve is normal in structure. Trivial mitral valve  regurgitation. No evidence of mitral stenosis.  4. The aortic valve is normal in structure. Aortic valve regurgitation is not visualized. No aortic stenosis is present.  5. The inferior vena cava is normal in size with greater than 50% respiratory variability, suggesting right atrial pressure of 3 mmHg. FINDINGS  Left Ventricle: Left ventricular ejection fraction, by estimation, is 65 to 70%. The left ventricle has no regional wall motion abnormalities. The left ventricular internal cavity size was normal in size. There is mild left ventricular hypertrophy. Left  ventricular diastolic parameters are consistent with Grade I diastolic dysfunction (impaired relaxation). Right Ventricle: The right ventricular size is normal. No increase in right ventricular wall thickness. Right ventricular systolic function is normal. There is moderately elevated pulmonary artery systolic pressure. The tricuspid regurgitant velocity is 3.56 m/s, and with an assumed right atrial pressure of 3 mmHg, the estimated right ventricular systolic pressure is 63.8 mmHg. Left Atrium: Left atrial size was normal in size. Right Atrium: Right atrial size was normal in size. Pericardium: There is no evidence of pericardial effusion. Mitral Valve: The mitral valve is normal in structure. Trivial mitral valve regurgitation. No evidence of mitral valve stenosis. Tricuspid Valve: The tricuspid valve is normal in structure. Tricuspid valve regurgitation is trivial. No evidence of tricuspid stenosis. Aortic Valve: The aortic valve is normal in structure. Aortic valve regurgitation is not visualized. No aortic stenosis is present. Pulmonic Valve: The pulmonic valve was normal in structure. Pulmonic valve regurgitation is trivial. No evidence of pulmonic stenosis. Aorta: The aortic root is normal in size and structure. Venous: The inferior vena cava is normal in size with greater than 50% respiratory variability, suggesting right atrial pressure of 3 mmHg.  IAS/Shunts: No atrial level shunt detected by color flow Doppler. LEFT VENTRICLE PLAX 2D LVIDd:         4.10 cm   Diastology LVIDs:         2.40 cm   LV e' medial:    8.59 cm/s LV PW:         0.80 cm   LV E/e' medial:  12.0 LV IVS:        0.90 cm   LV e' lateral:   11.90 cm/s LVOT diam:     1.70 cm   LV E/e' lateral: 8.7 LV SV:         67 LV SV Index:   49 LVOT Area:     2.27 cm  IVC IVC diam: 1.20 cm LEFT ATRIUM         Index LA diam:    3.00 cm 2.23 cm/m  AORTIC VALVE LVOT Vmax:   137.00 cm/s LVOT Vmean:  82.900 cm/s LVOT VTI:    0.293 m  AORTA Ao  Asc diam: 3.20 cm MITRAL VALVE                TRICUSPID VALVE MV Area (PHT): 3.37 cm     TR Peak grad:   50.7 mmHg MV Decel Time: 225 msec     TR Vmax:        356.00 cm/s MV E velocity: 103.00 cm/s MV A velocity: 114.00 cm/s  SHUNTS MV E/A ratio:  0.90         Systemic VTI:  0.29 m                             Systemic Diam: 1.70 cm Glori Bickers MD Electronically signed by Glori Bickers MD Signature Date/Time: 07/16/2021/4:28:36 PM    Final        The results of significant diagnostics from this hospitalization (including imaging, microbiology, ancillary and laboratory) are listed below for reference.     Microbiology: Recent Results (from the past 240 hour(s))  Resp Panel by RT-PCR (Flu A&B, Covid) Nasopharyngeal Swab     Status: None   Collection Time: 07/15/21  6:23 PM   Specimen: Nasopharyngeal Swab; Nasopharyngeal(NP) swabs in vial transport medium  Result Value Ref Range Status   SARS Coronavirus 2 by RT PCR NEGATIVE NEGATIVE Final    Comment: (NOTE) SARS-CoV-2 target nucleic acids are NOT DETECTED.  The SARS-CoV-2 RNA is generally detectable in upper respiratory specimens during the acute phase of infection. The lowest concentration of SARS-CoV-2 viral copies this assay can detect is 138 copies/mL. A negative result does not preclude SARS-Cov-2 infection and should not be used as the sole basis for treatment or other patient  management decisions. A negative result may occur with  improper specimen collection/handling, submission of specimen other than nasopharyngeal swab, presence of viral mutation(s) within the areas targeted by this assay, and inadequate number of viral copies(<138 copies/mL). A negative result must be combined with clinical observations, patient history, and epidemiological information. The expected result is Negative.  Fact Sheet for Patients:  EntrepreneurPulse.com.au  Fact Sheet for Healthcare Providers:  IncredibleEmployment.be  This test is no t yet approved or cleared by the [place] FDA and  has been authorized for detection and/or diagnosis of SARS-CoV-2 by FDA under an Emergency Use Authorization (EUA). This EUA will remain  in effect (meaning this test can be used) for the duration of the COVID-19 declaration under Section 564(b)(1) of the Act, 21 U.S.C.section 360bbb-3(b)(1), unless the authorization is terminated  or revoked sooner.       Influenza A by PCR NEGATIVE NEGATIVE Final   Influenza B by PCR NEGATIVE NEGATIVE Final    Comment: (NOTE) The Xpert Xpress SARS-CoV-2/FLU/RSV plus assay is intended as an aid in the diagnosis of influenza from Nasopharyngeal swab specimens and should not be used as a sole basis for treatment. Nasal washings and aspirates are unacceptable for Xpert Xpress SARS-CoV-2/FLU/RSV testing.  Fact Sheet for Patients: EntrepreneurPulse.com.au  Fact Sheet for Healthcare Providers: IncredibleEmployment.be  This test is not yet approved or cleared by the [place] FDA and has been authorized for detection and/or diagnosis of SARS-CoV-2 by FDA under an Emergency Use Authorization (EUA). This EUA will remain in effect (meaning this test can be used) for the duration of the COVID-19 declaration under Section 564(b)(1) of the Act, 21 U.S.C. section 360bbb-3(b)(1),  unless the authorization is terminated or revoked.  Performed at [facility], [place],  [place] [address]      Labs:  CBC: Recent Labs  Lab 07/18/21 0642 07/20/21 0436 07/22/21 0841 07/23/21 0133 07/23/21 0621 07/24/21 0118  WBC 6.0 7.0 9.7 10.2 10.1  --   HGB 9.4* 8.4* 5.6* 8.2* 8.5* 8.2*  HCT 29.3* 26.5* 17.9* 24.3* 26.1* 25.3*  MCV 84.0 86.9 90.4 86.8 89.1  --   PLT 284 288 277 231 260  --    BMP &GFR Recent Labs  Lab 07/20/21 0436 07/22/21 0841 07/23/21 0133 07/23/21 0621 07/23/21 1430 07/24/21 0118  NA 144 142 140 137  --  140  K 5.0 5.6* 5.9* 5.5*  5.7* 5.0 4.8  CL 112* 113* 109 107  --  110  CO2 _0 --  25  GLUCOSE 110* 100* 104* 90  --  124*  BUN 70* 77* 80* 79*  --  79*  CREATININE 2.39* 2.33* 2.45* 2.32*  --  2.43*  CALCIUM 9.1 9.3 9.3 9.3  --  9.4  MG  --  2.3 2.4  --   --  2.3  PHOS  --  2.5 2.7 2.8  --  2.9   Estimated Creatinine Clearance: 12.8 mL/min (A) (by C-G formula based on SCr of 2.43 mg/dL (H)). Liver & Pancreas: Recent Labs  Lab 07/22/21 0841 07/23/21 0133 07/23/21 0621 07/24/21 0118  ALBUMIN 2.4* 2.3* 2.5* 2.4*   No results for input(s): LIPASE, AMYLASE in the last 168 hours. No results for input(s): AMMONIA in the last 168 hours. Diabetic: No results for input(s): HGBA1C in the last 72 hours. No results for input(s): GLUCAP in the last 168 hours. Cardiac Enzymes: Recent Labs  Lab 07/22/21 0841  CKTOTAL 130   No results for input(s): PROBNP in the last 8760 hours. Coagulation Profile: Recent Labs  Lab 07/23/21 0133  INR 1.1   Thyroid Function Tests: No results for input(s): TSH, T4TOTAL, FREET4, T3FREE, THYROIDAB in the last 72 hours. Lipid Profile: No results for input(s): CHOL, HDL, LDLCALC, TRIG, CHOLHDL, LDLDIRECT in the last 72 hours. Anemia Panel: No results for input(s): VITAMINB12, FOLATE, FERRITIN, TIBC, IRON, RETICCTPCT in the last 72 hours. Urine analysis:    Component  Value Date/Time   COLORURINE YELLOW 07/17/2021 [place] 07/17/2021 1434   LABSPEC 1.014 07/17/2021 1434   PHURINE 5.0 07/17/2021 1434   GLUCOSEU NEGATIVE 07/17/2021 1434   [place] 07/17/2021 [place] 07/17/2021 1434   BILIRUBINUR Negative 07/13/2021 [place] 07/17/2021 1434   PROTEINUR 100 (A) 07/17/2021 1434   UROBILINOGEN 0.2 07/13/2021 1654   NITRITE NEGATIVE 07/17/2021 1434   LEUKOCYTESUR NEGATIVE 07/17/2021 1434   Sepsis Labs: Invalid input(s): PROCALCITONIN, LACTICIDVEN   Time coordinating discharge: 55 minutes  SIGNED:  Mercy Riding, MD  [facility] 07/24/2021, 6:11 PM

## 2021-07-24 NOTE — Care Management Important Message (Signed)
Important Message  Patient Details  Name: Leslie Reeves MRN: [id number] Date of Birth: 1941/05/17   Medicare Important Message Given:  Yes     Shelda Altes 07/24/2021, 9:02 AM

## 2021-07-24 NOTE — TOC Transition Note (Addendum)
Transition of Care [facility]) - CM/SW Discharge Note   Patient Details  Name: Leslie Reeves MRN: [id number] Date of Birth: 1941-02-13  [facility] of [facility]) CM/SW Contact:  Zenon Mayo, RN Phone Number: 07/24/2021, 9:17 AM   Clinical Narrative:    Patient is for dc today, NCM made referral to [facility] with [place] for [place], [place], she is able to take referral with soc of Wed.  NCM made rerferral to [employer] for tub bench also, this will be brought up to patient's room. NCM informed daughter that soc will be next Wed for therapies. Patient will need home oxygen, NCM spoke with daughter, she states she is ok with [employer] supplying the home oxygen.     Final next level of care: [place] Barriers to Discharge: No Barriers Identified   Patient Goals and CMS Choice Patient states their goals for this hospitalization and ongoing recovery are:: return home CMS Medicare.gov Compare Post Acute Care list provided to:: Patient Represenative (must comment) Choice offered to / list presented to : Adult Children  Discharge Placement                       Discharge Plan and Services                DME Arranged: Tub bench, Oxygen DME Agency: [employer] Date DME Agency Contacted: 07/24/21 Time DME Agency Contacted: [phone] Representative spoke with at DME Agency: Adela Lank HH Arranged: PT, OT [place] Agency: [place] Date [place]: 07/24/21 Time [place]: [phone] Representative spoke with at [place]: [place] ([place]) Interventions     Readmission Risk Interventions No flowsheet data found.

## 2021-07-24 NOTE — Progress Notes (Signed)
Patient discharged: Home with family.  Left via: Wheelchair  Discharge paperwork reviewed and given: to patient and family. Teach back completed. IV and telemetry disconnected. Belongings given to patient.

## 2021-07-24 NOTE — Progress Notes (Signed)
SATURATION QUALIFICATIONS: (This note is used to comply with regulatory documentation for home oxygen)  Patient Saturations on Room Air at Rest = 88%  Patient Saturations on Room Air while Ambulating = 74%  Patient Saturations on 3 Liters of oxygen while Ambulating = 94%  Please briefly explain why patient needs home oxygen: When patient tried to ambulate without oxygen her oxygen saturation dropped to 74% after oxygen was provided it raised to 94%.

## 2021-07-27 ENCOUNTER — Telehealth: Payer: Self-pay

## 2021-07-27 DIAGNOSIS — I5031 Acute diastolic (congestive) heart failure: Secondary | ICD-10-CM | POA: Diagnosis not present

## 2021-07-27 DIAGNOSIS — I10 Essential (primary) hypertension: Secondary | ICD-10-CM | POA: Diagnosis not present

## 2021-07-27 NOTE — Telephone Encounter (Signed)
Transition Care Management Follow-up Telephone Call Date of discharge and from where: 07/24/2021 [facility]  How have you been since you were released from the hospital? doing okay, sore as of now, experiencing SOB.  Any questions or concerns? No  Items Reviewed: Did the pt receive and understand the discharge instructions provided? Yes  Medications obtained and verified? Yes  Other? Yes  Any new allergies since your discharge? No  Dietary orders reviewed? Yes Do you have support at home? Yes   Home Care and Equipment/Supplies: Were home health services ordered? yes If so, what is the name of the agency? N/a  Has the agency set up a time to come to the patient's home? yes Were any new equipment or medical supplies ordered?  Yes: n/a What is the name of the medical supply agency? N/a Were you able to get the supplies/equipment? not applicable Do you have any questions related to the use of the equipment or supplies? No  Functional Questionnaire: (I = Independent and D = Dependent) ADLs: I/d  Bathing/Dressing- I/d  Meal Prep- I/d  Eating- i  Maintaining continence- I/d  Transferring/Ambulation- I/d  Managing Meds- I/d   Follow up appointments reviewed:  PCP Hospital f/u appt confirmed? Yes  Scheduled to see raman ghumman on 07/29/2021 @ [facility]. [facility] f/u appt confirmed? Yes  Scheduled to see neurologists on 08/24/2021 @ n/a. Are transportation arrangements needed? No  If their condition worsens, is the pt aware to call PCP or go to the Emergency Dept.? Yes Was the patient provided with contact information for the PCP's office or ED? Yes Was to pt encouraged to call back with questions or concerns? Yes

## 2021-07-28 DIAGNOSIS — N189 Chronic kidney disease, unspecified: Secondary | ICD-10-CM | POA: Diagnosis not present

## 2021-07-28 DIAGNOSIS — J449 Chronic obstructive pulmonary disease, unspecified: Secondary | ICD-10-CM | POA: Diagnosis not present

## 2021-07-28 DIAGNOSIS — I130 Hypertensive heart and chronic kidney disease with heart failure and stage 1 through stage 4 chronic kidney disease, or unspecified chronic kidney disease: Secondary | ICD-10-CM | POA: Diagnosis not present

## 2021-07-28 DIAGNOSIS — Z9181 History of falling: Secondary | ICD-10-CM | POA: Diagnosis not present

## 2021-07-28 DIAGNOSIS — D631 Anemia in chronic kidney disease: Secondary | ICD-10-CM | POA: Diagnosis not present

## 2021-07-28 DIAGNOSIS — E1122 Type 2 diabetes mellitus with diabetic chronic kidney disease: Secondary | ICD-10-CM | POA: Diagnosis not present

## 2021-07-28 DIAGNOSIS — E43 Unspecified severe protein-calorie malnutrition: Secondary | ICD-10-CM | POA: Diagnosis not present

## 2021-07-28 DIAGNOSIS — I5032 Chronic diastolic (congestive) heart failure: Secondary | ICD-10-CM | POA: Diagnosis not present

## 2021-07-28 DIAGNOSIS — Z8673 Personal history of transient ischemic attack (TIA), and cerebral infarction without residual deficits: Secondary | ICD-10-CM | POA: Diagnosis not present

## 2021-07-28 DIAGNOSIS — E039 Hypothyroidism, unspecified: Secondary | ICD-10-CM | POA: Diagnosis not present

## 2021-07-28 DIAGNOSIS — Z87891 Personal history of nicotine dependence: Secondary | ICD-10-CM | POA: Diagnosis not present

## 2021-07-28 DIAGNOSIS — I272 Pulmonary hypertension, unspecified: Secondary | ICD-10-CM | POA: Diagnosis not present

## 2021-07-28 DIAGNOSIS — R627 Adult failure to thrive: Secondary | ICD-10-CM | POA: Diagnosis not present

## 2021-07-28 DIAGNOSIS — Z9981 Dependence on supplemental oxygen: Secondary | ICD-10-CM | POA: Diagnosis not present

## 2021-07-29 ENCOUNTER — Other Ambulatory Visit: Payer: Self-pay

## 2021-07-29 ENCOUNTER — Encounter: Payer: Self-pay | Admitting: Nurse Practitioner

## 2021-07-29 ENCOUNTER — Ambulatory Visit (INDEPENDENT_AMBULATORY_CARE_PROVIDER_SITE_OTHER): Payer: Medicare Other | Admitting: Nurse Practitioner

## 2021-07-29 VITALS — BP 122/66 | HR 66 | Temp 99.0°F

## 2021-07-29 DIAGNOSIS — E039 Hypothyroidism, unspecified: Secondary | ICD-10-CM | POA: Diagnosis not present

## 2021-07-29 DIAGNOSIS — I129 Hypertensive chronic kidney disease with stage 1 through stage 4 chronic kidney disease, or unspecified chronic kidney disease: Secondary | ICD-10-CM

## 2021-07-29 DIAGNOSIS — N189 Chronic kidney disease, unspecified: Secondary | ICD-10-CM | POA: Diagnosis not present

## 2021-07-29 DIAGNOSIS — D638 Anemia in other chronic diseases classified elsewhere: Secondary | ICD-10-CM | POA: Diagnosis not present

## 2021-07-29 DIAGNOSIS — R531 Weakness: Secondary | ICD-10-CM

## 2021-07-29 DIAGNOSIS — I5033 Acute on chronic diastolic (congestive) heart failure: Secondary | ICD-10-CM

## 2021-07-29 DIAGNOSIS — Z9181 History of falling: Secondary | ICD-10-CM | POA: Diagnosis not present

## 2021-07-29 DIAGNOSIS — Z8673 Personal history of transient ischemic attack (TIA), and cerebral infarction without residual deficits: Secondary | ICD-10-CM | POA: Diagnosis not present

## 2021-07-29 DIAGNOSIS — Z9981 Dependence on supplemental oxygen: Secondary | ICD-10-CM | POA: Diagnosis not present

## 2021-07-29 DIAGNOSIS — N183 Chronic kidney disease, stage 3 unspecified: Secondary | ICD-10-CM | POA: Diagnosis not present

## 2021-07-29 DIAGNOSIS — I130 Hypertensive heart and chronic kidney disease with heart failure and stage 1 through stage 4 chronic kidney disease, or unspecified chronic kidney disease: Secondary | ICD-10-CM | POA: Diagnosis not present

## 2021-07-29 DIAGNOSIS — E43 Unspecified severe protein-calorie malnutrition: Secondary | ICD-10-CM | POA: Diagnosis not present

## 2021-07-29 DIAGNOSIS — R0609 Other forms of dyspnea: Secondary | ICD-10-CM | POA: Diagnosis not present

## 2021-07-29 DIAGNOSIS — E1169 Type 2 diabetes mellitus with other specified complication: Secondary | ICD-10-CM

## 2021-07-29 DIAGNOSIS — J449 Chronic obstructive pulmonary disease, unspecified: Secondary | ICD-10-CM

## 2021-07-29 DIAGNOSIS — I5032 Chronic diastolic (congestive) heart failure: Secondary | ICD-10-CM | POA: Diagnosis not present

## 2021-07-29 DIAGNOSIS — E785 Hyperlipidemia, unspecified: Secondary | ICD-10-CM | POA: Diagnosis not present

## 2021-07-29 DIAGNOSIS — R627 Adult failure to thrive: Secondary | ICD-10-CM | POA: Diagnosis not present

## 2021-07-29 DIAGNOSIS — D631 Anemia in chronic kidney disease: Secondary | ICD-10-CM | POA: Diagnosis not present

## 2021-07-29 DIAGNOSIS — Z87891 Personal history of nicotine dependence: Secondary | ICD-10-CM | POA: Diagnosis not present

## 2021-07-29 DIAGNOSIS — E1122 Type 2 diabetes mellitus with diabetic chronic kidney disease: Secondary | ICD-10-CM | POA: Diagnosis not present

## 2021-07-29 DIAGNOSIS — I272 Pulmonary hypertension, unspecified: Secondary | ICD-10-CM | POA: Diagnosis not present

## 2021-07-29 NOTE — Patient Instructions (Signed)
Anemia Anemia is a condition in which there is not enough red blood cells or hemoglobin in the blood. Hemoglobin is a substance in red blood cells that carries oxygen. When you do not have enough red blood cells or hemoglobin (are anemic), your body cannot get enough oxygen and your organs may not work properly. As a result, you may feel very tired or have other problems. What are the causes? Common causes of anemia include: Excessive bleeding. Anemia can be caused by excessive bleeding inside or outside the body, including bleeding from the intestines or from heavy menstrual periods in females. Poor nutrition. Long-lasting (chronic) kidney, thyroid, and liver disease. Bone marrow disorders, spleen problems, and blood disorders. Cancer and treatments for cancer. HIV (human immunodeficiency virus) and AIDS (acquired immunodeficiency syndrome). Infections, medicines, and autoimmune disorders that destroy red blood cells. What are the signs or symptoms? Symptoms of this condition include: Minor weakness. Dizziness. Headache, or difficulties concentrating and sleeping. Heartbeats that feel irregular or faster than normal (palpitations). Shortness of breath, especially with exercise. Pale skin, lips, and nails, or cold hands and feet. Indigestion and nausea. Symptoms may occur suddenly or develop slowly. If your anemia is mild, you may not have symptoms. How is this diagnosed? This condition is diagnosed based on blood tests, your medical history, and a physical exam. In some cases, a test may be needed in which cells are removed from the soft tissue inside of a bone and looked at under a microscope (bone marrow biopsy). Your health care provider may also check your stool (feces) for blood and may do additional testing to look for the cause of your bleeding. Other tests may include: Imaging tests, such as a CT scan or MRI. A procedure to see inside your esophagus and stomach (endoscopy). A  procedure to see inside your colon and rectum (colonoscopy). How is this treated? Treatment for this condition depends on the cause. If you continue to lose a lot of blood, you may need to be treated at a hospital. Treatment may include: Taking supplements of iron, vitamin B12, or folic acid. Taking a hormone medicine (erythropoietin) that can help to stimulate red blood cell growth. Having a blood transfusion. This may be needed if you lose a lot of blood. Making changes to your diet. Having surgery to remove your spleen. Follow these instructions at home: Take over-the-counter and prescription medicines only as told by your health care provider. Take supplements only as told by your health care provider. Follow any diet instructions that you were given by your health care provider. Keep all follow-up visits as told by your health care provider. This is important. Contact a health care provider if: You develop new bleeding anywhere in the body. Get help right away if: You are very weak. You are short of breath. You have pain in your abdomen or chest. You are dizzy or feel faint. You have trouble concentrating. You have bloody stools, black stools, or tarry stools. You vomit repeatedly or you vomit up blood. These symptoms may represent a serious problem that is an emergency. Do not wait to see if the symptoms will go away. Get medical help right away. Call your local emergency services (911 in the [place] Do not drive yourself to the hospital. Summary Anemia is a condition in which you do not have enough red blood cells or enough of a substance in your red blood cells that carries oxygen (hemoglobin). Symptoms may occur suddenly or develop slowly. If your anemia is   mild, you may not have symptoms. This condition is diagnosed with blood tests, a medical history, and a physical exam. Other tests may be needed. Treatment for this condition depends on the cause of the anemia. This  information is not intended to replace advice given to you by your health care provider. Make sure you discuss any questions you have with your health care provider. Document Revised: 08/21/2019 Document Reviewed: 08/21/2019 Elsevier Patient Education  2022 Elsevier Inc.  

## 2021-07-29 NOTE — Progress Notes (Signed)
I,Tianna Badgett,acting as a Education administrator for [employer], NP.,have documented all relevant documentation on the behalf of [employer], NP,as directed by  [name], NP while in the presence of [name], NP.  This visit occurred during the SARS-CoV-2 public health emergency.  Safety protocols were in place, including screening questions prior to the visit, additional usage of staff PPE, and extensive cleaning of exam room while observing appropriate contact time as indicated for disinfecting solutions.  Subjective:     Patient ID: Leslie Reeves , female    DOB: [birth year] , 80 y.o.   MRN: [id number]   Chief Complaint  Patient presents with   Hospitalization Follow-up    HPI  Patient is here for hospital follow up. Continue home oxygen at 3L  Make appt with cardiologist and nephrologist in regards to her lasix  Limit water intake and sodium intake.  Kidney and heart doctor this month. She is also seeing the neurology. The Ed stopped her on the vitamin B complex and aspirin due to her hematoma that she got from the hospital. Today she is tired and weak. Denies shortness of breath or chest pain. O2 level was 90% without oxygen.     Past Medical History:  Diagnosis Date   Allergy    Diabetes mellitus without complication (HCC)    Hypertension      Family History  Problem Relation Age of Onset   Heart disease Other    Stroke Other    Hypertension Other    COPD Other    Cancer Other    Asthma Other    Breast cancer Neg Hx      Current Outpatient Medications:    Accu-Chek Softclix Lancets lancets, Use as instructed, Disp: 100 each, Rfl: 12   acetaminophen (TYLENOL) 325 MG tablet, Take 2 tablets (650 mg total) by mouth every 6 (six) hours as needed for headache or mild pain., Disp: , Rfl:    albuterol (VENTOLIN HFA) 108 (90 Base) MCG/ACT inhaler, INHALE 2 PUFFS BY MOUTH 4 TIMES A DAY (Patient taking differently: Inhale 2 puffs into the lungs every 6 (six)  hours as needed for shortness of breath or wheezing.), Disp: 20.1 each, Rfl: 3   blood glucose meter kit and supplies KIT, Dispense based on patient and insurance preference. Use up to four times daily as directed. (FOR ICD-9 250.00, 250.01)., Disp: 1 each, Rfl: 0   diclofenac sodium (VOLTAREN) 1 % GEL, Apply 2 g topically 4 (four) times daily. (Patient not taking: Reported on 07/15/2021), Disp: 100 g, Rfl: 1   fluticasone-salmeterol (WIXELA INHUB) 100-50 MCG/ACT AEPB, TAKE 1 PUFF BY MOUTH EVERY 12 HOURS IN THE MORNING AND IN THE EVENING (Patient taking differently: Inhale 1 puff into the lungs 2 (two) times daily.), Disp: 180 each, Rfl: 11   glucose blood (ACCU-CHEK AVIVA PLUS) test strip, Use as instructed, Disp: 300 each, Rfl: 3   isosorbide-hydrALAZINE (BIDIL) 20-37.5 MG tablet, Take 1 tablet by mouth 3 (three) times daily., Disp: 30 tablet, Rfl: 0   levothyroxine (SYNTHROID) 25 MCG tablet, Take 1.5 tablets (37.5 mcg total) by mouth daily before breakfast., Disp: 135 tablet, Rfl: 2   megestrol (MEGACE ES) 625 MG/5ML suspension, TAKE 5 MLS (625 MG TOTAL) BY MOUTH DAILY., Disp: 150 mL, Rfl: 1   mirtazapine (REMERON) 15 MG tablet, TAKE 1 TABLET BY MOUTH EVERYDAY AT BEDTIME (Patient taking differently: Take 15 mg by mouth at bedtime.), Disp: 90 tablet, Rfl: 1   Multiple Vitamin (MULTIVITAMIN WITH MINERALS) TABS  tablet, Take 1 tablet by mouth daily., Disp: , Rfl:    rosuvastatin (CRESTOR) 20 MG tablet, TAKE 1 TABLET BY MOUTH EVERY DAY (Patient taking differently: Take 20 mg by mouth daily.), Disp: 90 tablet, Rfl: 0   Allergies  Allergen Reactions   Penicillin G Rash   Penicillins Rash     Review of Systems  Constitutional:  Negative for chills and fever.  HENT:  Negative for congestion.   Respiratory: Negative.  Negative for cough, shortness of breath and wheezing.   Cardiovascular: Negative.  Negative for chest pain and palpitations.  Gastrointestinal: Negative.   Neurological:  Positive for  weakness. Negative for dizziness and headaches.    Today's Vitals   07/29/21 1157  BP: 122/66  Pulse: 66  Temp: 99 F (37.2 C)  TempSrc: Oral  SpO2: 90%   There is no height or weight on file to calculate BMI.   Objective:  Physical Exam Constitutional:      Appearance: Normal appearance.     Comments: Frail   HENT:     Head: Normocephalic and atraumatic.  Cardiovascular:     Rate and Rhythm: Normal rate and regular rhythm.     Pulses: Normal pulses.     Heart sounds: Normal heart sounds. No murmur heard. Pulmonary:     Effort: Pulmonary effort is normal. No respiratory distress.     Breath sounds: No wheezing.  Skin:    General: Skin is warm and dry.  Neurological:     Mental Status: She is alert.        Assessment And Plan:     1. Anemia of chronic disease -She did receive blood transfusion in the most recent hospitalization so will check her H & H today and assess  -She does have hematoma on her right hip joint due to a fall in the hospital so will check H&H  today.  - CMP14+EGFR - CBC no Diff - Magnesium  2. Benign hypertension with chronic kidney disease, stage III (HCC) -Stable. Followed up by the cardiologist and nephro. Does have an appt with both of them this month.   3. Type 2 diabetes mellitus with hyperlipidemia (HCC) -Stable. Currently not on any meds. Due to CKD   4. Dyspnea on exertion -will check her H & H and advised to use her home oxygen  5. Acute on chronic diastolic CHF (congestive heart failure), NYHA class 3 (HCC) -Stable continue meds.  -Lasix stopped in the ED. Need to check renal function.  -Advised to follow up with cardiology  -Will check labs today  -Pt. Was advised to call or go to the ED with 3 pd weight gain in 24 hours or 5 pounds in 1 week. SOB, swelling in hands, feet or stomach. Sleep with extra pillows  -Follow low sodium diet.   6. Chronic obstructive pulmonary disease, unspecified COPD type ([place]) -Stable. Continue  inhalers   7. Generalized weakness - Magnesium -CBC  -Cmp  -PT/OT    8. Severe malnutrition ([place]) -Continue ensure enlive at home.    Patient and pt. Daughter was advised to go to the hospital if she experiences any worsening of shortness of breath, weight gain, chest pain or weakness. Patient and daughter verbalized understanding.   The patient was encouraged to call or send a message through [place] for any questions or concerns.   Follow up: if symptoms persist or do not get better.   Side effects and appropriate use of all the medication(s) were discussed with  the patient today. Patient advised to use the medication(s) as directed by their healthcare provider. The patient was encouraged to read, review, and understand all associated package inserts and contact our office with any questions or concerns. The patient accepts the risks of the treatment plan and had an opportunity to ask questions.   Patient was given opportunity to ask questions. Patient verbalized understanding of the plan and was able to repeat key elements of the plan. All questions were answered to their satisfaction.  Raman Parminder Trapani, DNP   I, Raman Briasia Flinders have reviewed all documentation for this visit. The documentation on 07/29/21 for the exam, diagnosis, procedures, and orders are all accurate and complete.    IF YOU HAVE BEEN REFERRED TO A SPECIALIST, IT MAY TAKE 1-2 WEEKS TO SCHEDULE/PROCESS THE REFERRAL. IF YOU HAVE NOT HEARD FROM US/SPECIALIST IN TWO WEEKS, PLEASE GIVE [place] A CALL AT [phone].   THE PATIENT IS ENCOURAGED TO PRACTICE SOCIAL DISTANCING DUE TO THE COVID-19 PANDEMIC.

## 2021-07-30 LAB — CBC
Hematocrit: 32 % — ABNORMAL LOW (ref 34.0–46.6)
Hemoglobin: 10.2 g/dL — ABNORMAL LOW (ref 11.1–15.9)
MCH: 28.7 pg (ref 26.6–33.0)
MCHC: 31.9 g/dL (ref 31.5–35.7)
MCV: 90 fL (ref 79–97)
Platelets: 444 10*3/uL (ref 150–450)
RBC: 3.56 x10E6/uL — ABNORMAL LOW (ref 3.77–5.28)
RDW: 18.6 % — ABNORMAL HIGH (ref 11.7–15.4)
WBC: 8.4 10*3/uL (ref 3.4–10.8)

## 2021-07-30 LAB — CMP14+EGFR
ALT: 41 IU/L — ABNORMAL HIGH (ref 0–32)
AST: 50 IU/L — ABNORMAL HIGH (ref 0–40)
Albumin/Globulin Ratio: 1 — ABNORMAL LOW (ref 1.2–2.2)
Albumin: 3.3 g/dL — ABNORMAL LOW (ref 3.7–4.7)
Alkaline Phosphatase: 51 IU/L (ref 44–121)
BUN/Creatinine Ratio: 35 — ABNORMAL HIGH (ref 12–28)
BUN: 68 mg/dL — ABNORMAL HIGH (ref 8–27)
Bilirubin Total: 0.4 mg/dL (ref 0.0–1.2)
CO2: 14 mmol/L — ABNORMAL LOW (ref 20–29)
Calcium: 9.8 mg/dL (ref 8.7–10.3)
Chloride: 114 mmol/L — ABNORMAL HIGH (ref 96–106)
Creatinine, Ser: 1.92 mg/dL — ABNORMAL HIGH (ref 0.57–1.00)
Globulin, Total: 3.2 g/dL (ref 1.5–4.5)
Glucose: 101 mg/dL — ABNORMAL HIGH (ref 70–99)
Potassium: 5.7 mmol/L — ABNORMAL HIGH (ref 3.5–5.2)
Sodium: 147 mmol/L — ABNORMAL HIGH (ref 134–144)
Total Protein: 6.5 g/dL (ref 6.0–8.5)
eGFR: 26 mL/min/{1.73_m2} — ABNORMAL LOW (ref >59)

## 2021-07-30 LAB — MAGNESIUM: Magnesium: 2.4 mg/dL — ABNORMAL HIGH (ref 1.6–2.3)

## 2021-08-03 ENCOUNTER — Other Ambulatory Visit: Payer: Self-pay

## 2021-08-03 DIAGNOSIS — Z9181 History of falling: Secondary | ICD-10-CM | POA: Diagnosis not present

## 2021-08-03 DIAGNOSIS — I272 Pulmonary hypertension, unspecified: Secondary | ICD-10-CM | POA: Diagnosis not present

## 2021-08-03 DIAGNOSIS — I130 Hypertensive heart and chronic kidney disease with heart failure and stage 1 through stage 4 chronic kidney disease, or unspecified chronic kidney disease: Secondary | ICD-10-CM | POA: Diagnosis not present

## 2021-08-03 DIAGNOSIS — R627 Adult failure to thrive: Secondary | ICD-10-CM | POA: Diagnosis not present

## 2021-08-03 DIAGNOSIS — I5032 Chronic diastolic (congestive) heart failure: Secondary | ICD-10-CM | POA: Diagnosis not present

## 2021-08-03 DIAGNOSIS — Z9981 Dependence on supplemental oxygen: Secondary | ICD-10-CM | POA: Diagnosis not present

## 2021-08-03 DIAGNOSIS — Z87891 Personal history of nicotine dependence: Secondary | ICD-10-CM | POA: Diagnosis not present

## 2021-08-03 DIAGNOSIS — E1122 Type 2 diabetes mellitus with diabetic chronic kidney disease: Secondary | ICD-10-CM | POA: Diagnosis not present

## 2021-08-03 DIAGNOSIS — E039 Hypothyroidism, unspecified: Secondary | ICD-10-CM | POA: Diagnosis not present

## 2021-08-03 DIAGNOSIS — N189 Chronic kidney disease, unspecified: Secondary | ICD-10-CM | POA: Diagnosis not present

## 2021-08-03 DIAGNOSIS — D631 Anemia in chronic kidney disease: Secondary | ICD-10-CM | POA: Diagnosis not present

## 2021-08-03 DIAGNOSIS — Z8673 Personal history of transient ischemic attack (TIA), and cerebral infarction without residual deficits: Secondary | ICD-10-CM | POA: Diagnosis not present

## 2021-08-03 DIAGNOSIS — J449 Chronic obstructive pulmonary disease, unspecified: Secondary | ICD-10-CM | POA: Diagnosis not present

## 2021-08-03 MED ORDER — LEVOTHYROXINE SODIUM 50 MCG PO TABS: 50.0000 ug | ORAL_TABLET | Freq: Every day | ORAL | 2 refills | Status: DC

## 2021-08-05 DIAGNOSIS — Z87891 Personal history of nicotine dependence: Secondary | ICD-10-CM | POA: Diagnosis not present

## 2021-08-05 DIAGNOSIS — N1831 Chronic kidney disease, stage 3a: Secondary | ICD-10-CM | POA: Diagnosis not present

## 2021-08-05 DIAGNOSIS — Z8673 Personal history of transient ischemic attack (TIA), and cerebral infarction without residual deficits: Secondary | ICD-10-CM | POA: Diagnosis not present

## 2021-08-05 DIAGNOSIS — N189 Chronic kidney disease, unspecified: Secondary | ICD-10-CM | POA: Diagnosis not present

## 2021-08-05 DIAGNOSIS — J449 Chronic obstructive pulmonary disease, unspecified: Secondary | ICD-10-CM | POA: Diagnosis not present

## 2021-08-05 DIAGNOSIS — E1122 Type 2 diabetes mellitus with diabetic chronic kidney disease: Secondary | ICD-10-CM | POA: Diagnosis not present

## 2021-08-05 DIAGNOSIS — Z9181 History of falling: Secondary | ICD-10-CM | POA: Diagnosis not present

## 2021-08-05 DIAGNOSIS — R627 Adult failure to thrive: Secondary | ICD-10-CM | POA: Diagnosis not present

## 2021-08-05 DIAGNOSIS — I130 Hypertensive heart and chronic kidney disease with heart failure and stage 1 through stage 4 chronic kidney disease, or unspecified chronic kidney disease: Secondary | ICD-10-CM | POA: Diagnosis not present

## 2021-08-05 DIAGNOSIS — Z9981 Dependence on supplemental oxygen: Secondary | ICD-10-CM | POA: Diagnosis not present

## 2021-08-05 DIAGNOSIS — D631 Anemia in chronic kidney disease: Secondary | ICD-10-CM | POA: Diagnosis not present

## 2021-08-05 DIAGNOSIS — E039 Hypothyroidism, unspecified: Secondary | ICD-10-CM | POA: Diagnosis not present

## 2021-08-05 DIAGNOSIS — I272 Pulmonary hypertension, unspecified: Secondary | ICD-10-CM | POA: Diagnosis not present

## 2021-08-05 DIAGNOSIS — I5032 Chronic diastolic (congestive) heart failure: Secondary | ICD-10-CM | POA: Diagnosis not present

## 2021-08-10 ENCOUNTER — Ambulatory Visit (INDEPENDENT_AMBULATORY_CARE_PROVIDER_SITE_OTHER): Payer: Medicare Other

## 2021-08-10 DIAGNOSIS — I5033 Acute on chronic diastolic (congestive) heart failure: Secondary | ICD-10-CM

## 2021-08-10 DIAGNOSIS — E785 Hyperlipidemia, unspecified: Secondary | ICD-10-CM

## 2021-08-10 DIAGNOSIS — E1169 Type 2 diabetes mellitus with other specified complication: Secondary | ICD-10-CM

## 2021-08-10 DIAGNOSIS — N183 Chronic kidney disease, stage 3 unspecified: Secondary | ICD-10-CM

## 2021-08-10 DIAGNOSIS — I129 Hypertensive chronic kidney disease with stage 1 through stage 4 chronic kidney disease, or unspecified chronic kidney disease: Secondary | ICD-10-CM

## 2021-08-10 DIAGNOSIS — R0609 Other forms of dyspnea: Secondary | ICD-10-CM

## 2021-08-10 NOTE — Chronic Care Management (AMB) (Signed)
Chronic Care Management    Social Work Note  08/10/2021 Name: Leslie Reeves MRN: [id number] DOB: Mar 30, 1941  Leslie Reeves is a 80 y.o. year old female who is a primary care patient of [name], [place]. The CCM team was consulted to assist the patient with chronic disease management and/or care coordination needs related to:  HTN, COPD, Acute CHF, AKI .   Engaged with patient daughter/caregiver [name] by phone  for follow up visit in response to provider referral for social work chronic care management and care coordination services.   Consent to Services:  The patient was given information about Chronic Care Management services, agreed to services, and gave verbal consent prior to initiation of services.  Please see initial visit note for detailed documentation.   Patient agreed to services and consent obtained.   Assessment: Review of patient past medical history, allergies, medications, and health status, including review of relevant consultants reports was performed today as part of a comprehensive evaluation and provision of chronic care management and care coordination services.     SDOH (Social Determinants of Health) assessments and interventions performed:    Advanced Directives Status: Not addressed in this encounter.  CCM Care Plan  Allergies  Allergen Reactions   Penicillin G Rash   Penicillins Rash    Outpatient Encounter Medications as of 08/10/2021  Medication Sig   Accu-Chek Softclix Lancets lancets Use as instructed   acetaminophen (TYLENOL) 325 MG tablet Take 2 tablets (650 mg total) by mouth every 6 (six) hours as needed for headache or mild pain.   albuterol (VENTOLIN HFA) 108 (90 Base) MCG/ACT inhaler INHALE 2 PUFFS BY MOUTH 4 TIMES A DAY (Patient taking differently: Inhale 2 puffs into the lungs every 6 (six) hours as needed for shortness of breath or wheezing.)   blood glucose meter kit and supplies KIT Dispense based on patient and insurance preference.  Use up to four times daily as directed. (FOR ICD-9 250.00, 250.01).   diclofenac sodium (VOLTAREN) 1 % GEL Apply 2 g topically 4 (four) times daily. (Patient not taking: Reported on 07/15/2021)   fluticasone-salmeterol (WIXELA INHUB) 100-50 MCG/ACT AEPB TAKE 1 PUFF BY MOUTH EVERY 12 HOURS IN THE MORNING AND IN THE EVENING (Patient taking differently: Inhale 1 puff into the lungs 2 (two) times daily.)   glucose blood (ACCU-CHEK AVIVA PLUS) test strip Use as instructed   isosorbide-hydrALAZINE (BIDIL) 20-37.5 MG tablet Take 1 tablet by mouth 3 (three) times daily.   levothyroxine (SYNTHROID) 50 MCG tablet Take 1 tablet (50 mcg total) by mouth daily.   megestrol (MEGACE ES) 625 MG/5ML suspension TAKE 5 MLS (625 MG TOTAL) BY MOUTH DAILY.   mirtazapine (REMERON) 15 MG tablet TAKE 1 TABLET BY MOUTH EVERYDAY AT BEDTIME (Patient taking differently: Take 15 mg by mouth at bedtime.)   Multiple Vitamin (MULTIVITAMIN WITH MINERALS) TABS tablet Take 1 tablet by mouth daily.   rosuvastatin (CRESTOR) 20 MG tablet TAKE 1 TABLET BY MOUTH EVERY DAY (Patient taking differently: Take 20 mg by mouth daily.)   No facility-administered encounter medications on file as of 08/10/2021.    Patient Active Problem List   Diagnosis Date Noted   Protein-calorie malnutrition, severe 07/17/2021   Severe anemia 07/17/2021   Hypothyroidism 07/16/2021   Type 2 diabetes mellitus with hyperlipidemia ([place]) 07/16/2021   Failure to thrive in adult 07/16/2021   COPD (chronic obstructive pulmonary disease) ([place]) 07/16/2021   Acute on chronic diastolic CHF (congestive heart failure), NYHA class 3 ([place]) 07/15/2021  Chronic kidney disease (CKD) stage G3a/A1, moderately decreased glomerular filtration rate (GFR) between 45-59 mL/min/1.73 square meter and albuminuria creatinine ratio less than 30 mg/g (HCC) 06/13/2021   DM2 (diabetes mellitus, type 2) ([place]) 06/12/2021   AKI (acute kidney injury) ([place]) 06/12/2021   New onset of  congestive heart failure ([place]) 06/12/2021   Solitary pulmonary nodule 05/05/2020   Mild intermittent asthma 05/05/2020   Decreased appetite 02/05/2019   Prediabetes 02/05/2019   Essential hypertension 02/05/2019   Right thyroid nodule 10/09/2018    Conditions to be addressed/monitored:  HTN, COPD, Acute CHF, AKI  Care Plan : Social Work Plan of Care  Updates made by [name] since 08/10/2021 12:00 AM     Problem: Mobility and Independence      Long-Range Goal: Mobility and Independence Optimized Post Discharge   Start Date: 08/10/2021  Priority: High  Note:   Current Barriers:  Chronic disease management support and education needs related to  HTN, COPD, Acute CHF, AKI   Recent inpatient admission due to Dyspnea Does not currently weigh self daily Social Worker Clinical Goal(s):  patient will work with SW to identify and address any acute and/or chronic care coordination needs related to the self health management of  HTN, COPD, Acute CHF, AKI   Patient will follow cardiology recommendations regarding management of CHF SW Interventions:  Inter-disciplinary care team collaboration (see longitudinal plan of care) Collaboration with [name], FNP regarding development and update of comprehensive plan of care as evidenced by provider attestation and co-signature Successful outbound call placed to patients daughter and caregiver [name] to assess for care coordination needs post discharge Discussed the patient continues to use 3L Oxygen and sleeps often Patient is actively engaged with home health PT and receives visits 2 times weekly Determined the patient is not weighing herself daily  Reviewed hospital discharge instructions and the importance of weighing daily in an effort to manage cardiac conditions Upcomming appointments include: Nephrologist 11/16, Cardiologist 11/17. Neurologist 11/28 Patients daughter questions if Neurology consult is still needed as she feels  the patients memory is back to baseline Encouraged [place] to keep the appointment to establish care in case neurology is ever needed in the future Discussed plans for SW to collaborate with RN Care Manager to provide an update on patients disposition with plans for [place] to follow up with the patient and her daughter over the next month Encouraged [place] to contact the care management team if assistance is needed prior to [place] call Collaboration with [place] regarding interventions and plan Scheduled follow up call over the next 60 days  Patient Goals/Self-Care Activities patient will: with the help of her daughter  - Patient will attend all scheduled provider appointments -Follow discharge instructions regarding weighing daily -Contact SW as needed prior to next scheduled call  Follow Up Plan:  SW will follow up with the patient over the next 60 days       Follow Up Plan: SW will follow up with patient by phone over the next 60 days.      [name], BSW, CDP Social Worker, Certified Dementia Practitioner [facility] [phone]

## 2021-08-10 NOTE — Patient Instructions (Signed)
Social Worker Visit Information  Goals we discussed today:   Goals Addressed             This Visit's Progress    Mobility and Independence Optimized Post Discharge       Timeframe:  Long-Range Goal Priority:  High Start Date:   11.14.22                           Next planned outreach: 1.10.23                     Patient Goals/Self-Care Activities patient will: with the help of her daughter  - Patient will attend all scheduled provider appointments -Follow discharge instructions regarding weighing daily -Contact SW as needed prior to next scheduled call         Materials Provided: Verbal education about the importance of weighing daily provided by phone  The patient verbalized understanding of instructions, educational materials, and care plan provided today and declined offer to receive copy of patient instructions, educational materials, and care plan.   Follow Up Plan: SW will follow up with patient by phone over the next 60 days   [name], BSW, CDP Social Worker, Certified Dementia Practitioner [place] [facility] [phone]

## 2021-08-11 NOTE — Progress Notes (Signed)
She should be off the multivitamin or any vitamin that contains potassium. Decrease intake of potassium rich foods as well such as avocados, spinach, broccoli and bananas.

## 2021-08-12 DIAGNOSIS — J9610 Chronic respiratory failure, unspecified whether with hypoxia or hypercapnia: Secondary | ICD-10-CM | POA: Diagnosis not present

## 2021-08-12 DIAGNOSIS — I5032 Chronic diastolic (congestive) heart failure: Secondary | ICD-10-CM | POA: Diagnosis not present

## 2021-08-12 DIAGNOSIS — E875 Hyperkalemia: Secondary | ICD-10-CM | POA: Diagnosis not present

## 2021-08-12 DIAGNOSIS — N184 Chronic kidney disease, stage 4 (severe): Secondary | ICD-10-CM | POA: Diagnosis not present

## 2021-08-12 DIAGNOSIS — D62 Acute posthemorrhagic anemia: Secondary | ICD-10-CM | POA: Diagnosis not present

## 2021-08-12 DIAGNOSIS — R627 Adult failure to thrive: Secondary | ICD-10-CM | POA: Diagnosis not present

## 2021-08-12 DIAGNOSIS — N179 Acute kidney failure, unspecified: Secondary | ICD-10-CM | POA: Diagnosis not present

## 2021-08-12 DIAGNOSIS — S7001XA Contusion of right hip, initial encounter: Secondary | ICD-10-CM | POA: Diagnosis not present

## 2021-08-12 DIAGNOSIS — R531 Weakness: Secondary | ICD-10-CM | POA: Diagnosis not present

## 2021-08-12 DIAGNOSIS — J449 Chronic obstructive pulmonary disease, unspecified: Secondary | ICD-10-CM | POA: Diagnosis not present

## 2021-08-12 NOTE — Progress Notes (Signed)
Cardiology Office Note:    Date:  08/12/2021   ID:  Leslie Reeves, DOB 07-14-1941, MRN [id number]  PCP:  [name], FNP   [facility] Providers Cardiologist:  [name], MD     Referring MD: [name], FNP   No chief complaint on file. Diastolic HF, FTT  History of Present Illness:    Leslie Reeves is a 80 y.o. female with a hx of htn, who was hospitalized 06/11/2021- 06/13/2021 with decompensated Hfpef  She presented with dyspnea and swelling in her lower extremities which was progressive. She had elevated JVD and LE edema. She was managed with lasix and discharged on lasix 20 mg daily after two days in the hospital. Her BNP was 125. She had troponin level 100s with no delta. Her dry weight is around 80 pounds. She saw here PCP yesterday. She is in bed mostly at home and gets winded with walking. She sees a nephrologist. Her crt in July 1.22 and has slowly increased and continues to increase. She was on norvasc 10 mg for hypertension and bidil on her initial visit.  No known coronary artery disease history. She had CT head that showed chronic microvascular dx in the deep white matter.   She was seen 07/15/2021 with failure to thrive, dyspnea, and worsening renal function. She was therefore admitted to the hospital after her clinic visit. She had brief diuresis but renal function was not improving. BNP drawn in the hospital was 50. She was noted to have iron deficiency and anemic s/p blood transfusions and  IV iron. She was found to have a large 13 cm hematoma in the gluteal region. Her H&H stabilized.  Her crt plateaued and lasix thought contributing to AKI , it was stopped. She had persistent dyspnea and was found to be hypoxic thought 2/2 atelectasis. She was discharged on 3L. She was discharged still chronically ill appearing. Her crt trend 2.47->2.3. GFR 21. She was seen 11/2 on crt 1.9.  Today they come in and she is still sleeping a lot. Her hip feels better. She is  doing some physical therapy. She is  stills on the 3L of O2. Her daughter has a pulse ox at home I discussed with her and her daughter that her health is unlikely going to make significant  improvements. Considering her weight loss and persistent sleeping and fatigue, it is reasonable to take a palliative approach. They were in agreement.   06/12/2021:TTE normal LV function, no RWMA, normal RV function, modeate pulmonary htn RVSP 46 mmHg  Social History: over 50 years smoking. She is in bed most of the day  Family History: Mother- heart disease. Lived to 1. Father - heart disease. Brother - heart disease. Males deceased in there 65s  Past Medical History:  Diagnosis Date   Allergy    Diabetes mellitus without complication ([place])    Hypertension     Past Surgical History:  Procedure Laterality Date   ABDOMINAL HYSTERECTOMY      Current Medications: No outpatient medications have been marked as taking for the 08/13/21 encounter (Appointment) with [name], MD.     Allergies:   Penicillin g and Penicillins   Social History   Socioeconomic History   Marital status: Married    Spouse name: Not on file   Number of children: 2   Years of education: Not on file   Highest education level: High school graduate  Occupational History   Occupation: retired  Tobacco Use  Smoking status: Former    Packs/day: 0.50    Years: 50.00    Pack years: 25.00    Types: Cigarettes    Quit date: 2017    Years since quitting: 5.8   Smokeless tobacco: Never  Vaping Use   Vaping Use: Never used  Substance and Sexual Activity   Alcohol use: Not Currently   Drug use: Not Currently   Sexual activity: Not Currently  Other Topics Concern   Not on file  Social History Narrative   Not on file   Social Determinants of Health   Financial Resource Strain: Low Risk    Difficulty of Paying Living Expenses: Not hard at all  Food Insecurity: No Food Insecurity   Worried About Sales executive in the Last Year: Never true   [place] in the Last Year: Never true  Transportation Needs: No Transportation Needs   Lack of Transportation (Medical): No   Lack of Transportation (Non-Medical): No  Physical Activity: Inactive   Days of Exercise per Week: 0 days   Minutes of Exercise per Session: 0 min  Stress: No Stress Concern Present   Feeling of Stress : Not at all  Social Connections: Not on file     Family History: The patient's family history includes Asthma in an other family member; COPD in an other family member; Cancer in an other family member; Heart disease in an other family member; Hypertension in an other family member; Stroke in an other family member. There is no history of Breast cancer.  ROS:   Please see the history of present illness.     All other systems reviewed and are negative.  EKGs/Labs/Other Studies Reviewed:    The following studies were reviewed today:   EKG:  EKG is  ordered today.  The ekg ordered today demonstrates   NSR. Normal ecg  Recent Labs: 07/15/2021: TSH 1.690 07/23/2021: B Natriuretic Peptide 129.8 07/29/2021: ALT 41; BUN 68; Creatinine, Ser 1.92; Hemoglobin 10.2; Magnesium 2.4; Platelets 444; Potassium 5.7; Sodium 147  Recent Lipid Panel    Component Value Date/Time   CHOL 158 11/21/2019 1102   TRIG 70 11/21/2019 1102   HDL 78 11/21/2019 1102   CHOLHDL 2.0 11/21/2019 1102   LDLCALC 66 11/21/2019 1102     Risk Assessment/Calculations:           Physical Exam:    VS:    Vitals:   08/13/21 0928  BP: (!) 150/50  Pulse: 84  SpO2: 99%     Wt Readings from Last 3 Encounters:  07/24/21 96 lb 9 oz (43.8 kg)  07/15/21 91 lb 3.2 oz (41.4 kg)  07/13/21 90 lb 9.6 oz (41.1 kg)     GEN: Cachectic, dyspnea with mild movement HEENT: Normal NECK: JVD not seen at 45 degees with exaggerated carotid pulse; No carotid bruits CARDIAC: RRR, no murmurs, rubs, gallops Vasc: 2+ BL radial pulses RESPIRATORY:   increased wob use of accessory muscles at rest, decreased BS at the bases ABDOMEN: Soft, non-tender, non-distended MUSCULOSKELETAL:  No edema; No deformity  SKIN: Warm and dry NEUROLOGIC:  Alert and oriented x 3 PSYCHIATRIC:  Normal affect   ASSESSMENT:    Diastolic CHF: She is euvolemic. She is off lasix and agree with continuing off of lasix. Her BNP was low in the hospital.She had no wall motion on her echo. Her troponin was stable and very mild. She has no anginal symptoms. She is stable from a cardiac perspective  #  HTN: discussed more comfort measures. Can continue current medications without a strict BP goal  #Goals of Care: sent a message to her primary provider about our discussion and request for home health/palliative care. The goal is moving towards comfort measures. Did not address code status today but that would be important as well.  PLAN:    In order of problems listed above:  PRN follow up       Medication Adjustments/Labs and Tests Ordered: Current medicines are reviewed at length with the patient today.  Concerns regarding medicines are outlined above.    Signed, [name], MD  08/12/2021 7:15 PM    [facility]

## 2021-08-13 ENCOUNTER — Ambulatory Visit (INDEPENDENT_AMBULATORY_CARE_PROVIDER_SITE_OTHER): Payer: Medicare Other | Admitting: Internal Medicine

## 2021-08-13 ENCOUNTER — Other Ambulatory Visit: Payer: Self-pay

## 2021-08-13 ENCOUNTER — Other Ambulatory Visit: Payer: Self-pay | Admitting: Nurse Practitioner

## 2021-08-13 ENCOUNTER — Encounter: Payer: Self-pay | Admitting: Internal Medicine

## 2021-08-13 VITALS — BP 150/50 | HR 84 | Ht 61.0 in | Wt 94.2 lb

## 2021-08-13 DIAGNOSIS — R627 Adult failure to thrive: Secondary | ICD-10-CM

## 2021-08-13 DIAGNOSIS — I5033 Acute on chronic diastolic (congestive) heart failure: Secondary | ICD-10-CM

## 2021-08-13 NOTE — Patient Instructions (Signed)
Medication Instructions:  No Changes In Medications at this time.  *If you need a refill on your cardiac medications before your next appointment, please call your pharmacy*  Follow-Up: At [facility], you and your health needs are our priority.  As part of our continuing mission to provide you with exceptional heart care, we have created designated Provider Care Teams.  These Care Teams include your primary Cardiologist (physician) and Advanced Practice Providers (APPs -  Physician Assistants and Nurse Practitioners) who all work together to provide you with the care you need, when you need it.  We recommend signing up for the patient portal called "MyChart".  Sign up information is provided on this After Visit Summary.  MyChart is used to connect with patients for Virtual Visits (Telemedicine).  Patients are able to view lab/test results, encounter notes, upcoming appointments, etc.  Non-urgent messages can be sent to your provider as well.   To learn more about what you can do with MyChart, go to [email or web address].    Your next appointment:   AS NEEDED   The format for your next appointment:   In Person  Provider:   [name], MD

## 2021-08-14 DIAGNOSIS — R627 Adult failure to thrive: Secondary | ICD-10-CM | POA: Diagnosis not present

## 2021-08-14 DIAGNOSIS — N189 Chronic kidney disease, unspecified: Secondary | ICD-10-CM | POA: Diagnosis not present

## 2021-08-14 DIAGNOSIS — I130 Hypertensive heart and chronic kidney disease with heart failure and stage 1 through stage 4 chronic kidney disease, or unspecified chronic kidney disease: Secondary | ICD-10-CM | POA: Diagnosis not present

## 2021-08-14 DIAGNOSIS — E1122 Type 2 diabetes mellitus with diabetic chronic kidney disease: Secondary | ICD-10-CM | POA: Diagnosis not present

## 2021-08-14 DIAGNOSIS — J449 Chronic obstructive pulmonary disease, unspecified: Secondary | ICD-10-CM | POA: Diagnosis not present

## 2021-08-14 DIAGNOSIS — E039 Hypothyroidism, unspecified: Secondary | ICD-10-CM | POA: Diagnosis not present

## 2021-08-14 DIAGNOSIS — I5032 Chronic diastolic (congestive) heart failure: Secondary | ICD-10-CM | POA: Diagnosis not present

## 2021-08-14 DIAGNOSIS — Z9181 History of falling: Secondary | ICD-10-CM | POA: Diagnosis not present

## 2021-08-14 DIAGNOSIS — Z8673 Personal history of transient ischemic attack (TIA), and cerebral infarction without residual deficits: Secondary | ICD-10-CM | POA: Diagnosis not present

## 2021-08-14 DIAGNOSIS — D631 Anemia in chronic kidney disease: Secondary | ICD-10-CM | POA: Diagnosis not present

## 2021-08-14 DIAGNOSIS — Z9981 Dependence on supplemental oxygen: Secondary | ICD-10-CM | POA: Diagnosis not present

## 2021-08-14 DIAGNOSIS — I272 Pulmonary hypertension, unspecified: Secondary | ICD-10-CM | POA: Diagnosis not present

## 2021-08-14 DIAGNOSIS — Z87891 Personal history of nicotine dependence: Secondary | ICD-10-CM | POA: Diagnosis not present

## 2021-08-17 ENCOUNTER — Telehealth: Payer: Self-pay

## 2021-08-17 DIAGNOSIS — E1122 Type 2 diabetes mellitus with diabetic chronic kidney disease: Secondary | ICD-10-CM | POA: Diagnosis not present

## 2021-08-17 DIAGNOSIS — Z87891 Personal history of nicotine dependence: Secondary | ICD-10-CM | POA: Diagnosis not present

## 2021-08-17 DIAGNOSIS — N189 Chronic kidney disease, unspecified: Secondary | ICD-10-CM | POA: Diagnosis not present

## 2021-08-17 DIAGNOSIS — R627 Adult failure to thrive: Secondary | ICD-10-CM | POA: Diagnosis not present

## 2021-08-17 DIAGNOSIS — Z9181 History of falling: Secondary | ICD-10-CM | POA: Diagnosis not present

## 2021-08-17 DIAGNOSIS — Z8673 Personal history of transient ischemic attack (TIA), and cerebral infarction without residual deficits: Secondary | ICD-10-CM | POA: Diagnosis not present

## 2021-08-17 DIAGNOSIS — I130 Hypertensive heart and chronic kidney disease with heart failure and stage 1 through stage 4 chronic kidney disease, or unspecified chronic kidney disease: Secondary | ICD-10-CM | POA: Diagnosis not present

## 2021-08-17 DIAGNOSIS — D631 Anemia in chronic kidney disease: Secondary | ICD-10-CM | POA: Diagnosis not present

## 2021-08-17 DIAGNOSIS — J449 Chronic obstructive pulmonary disease, unspecified: Secondary | ICD-10-CM | POA: Diagnosis not present

## 2021-08-17 DIAGNOSIS — I272 Pulmonary hypertension, unspecified: Secondary | ICD-10-CM | POA: Diagnosis not present

## 2021-08-17 DIAGNOSIS — E039 Hypothyroidism, unspecified: Secondary | ICD-10-CM | POA: Diagnosis not present

## 2021-08-17 DIAGNOSIS — I5032 Chronic diastolic (congestive) heart failure: Secondary | ICD-10-CM | POA: Diagnosis not present

## 2021-08-17 DIAGNOSIS — Z9981 Dependence on supplemental oxygen: Secondary | ICD-10-CM | POA: Diagnosis not present

## 2021-08-17 NOTE — Telephone Encounter (Signed)
Spoke with patient's daughter [name] and scheduled an in-person Palliative Consult for 08/27/21 @ [place] screening was negative. No pets in home. Patient lives with daughter.   Consent obtained; updated Outlook/Netsmart/Team List and Epic.   Family is aware they may be receiving a call from provider the day before or day of to confirm appointment.

## 2021-08-24 ENCOUNTER — Other Ambulatory Visit: Payer: Self-pay

## 2021-08-24 ENCOUNTER — Encounter: Payer: Self-pay | Admitting: Neurology

## 2021-08-24 ENCOUNTER — Ambulatory Visit (INDEPENDENT_AMBULATORY_CARE_PROVIDER_SITE_OTHER): Payer: Medicare Other | Admitting: Neurology

## 2021-08-24 VITALS — BP 183/65 | HR 66 | Ht 62.0 in | Wt 97.5 lb

## 2021-08-24 DIAGNOSIS — F0280 Dementia in other diseases classified elsewhere without behavioral disturbance: Secondary | ICD-10-CM | POA: Diagnosis not present

## 2021-08-24 DIAGNOSIS — E039 Hypothyroidism, unspecified: Secondary | ICD-10-CM | POA: Diagnosis not present

## 2021-08-24 DIAGNOSIS — N189 Chronic kidney disease, unspecified: Secondary | ICD-10-CM | POA: Diagnosis not present

## 2021-08-24 DIAGNOSIS — Z87891 Personal history of nicotine dependence: Secondary | ICD-10-CM | POA: Diagnosis not present

## 2021-08-24 DIAGNOSIS — G309 Alzheimer's disease, unspecified: Secondary | ICD-10-CM

## 2021-08-24 DIAGNOSIS — Z8673 Personal history of transient ischemic attack (TIA), and cerebral infarction without residual deficits: Secondary | ICD-10-CM | POA: Diagnosis not present

## 2021-08-24 DIAGNOSIS — I272 Pulmonary hypertension, unspecified: Secondary | ICD-10-CM | POA: Diagnosis not present

## 2021-08-24 DIAGNOSIS — J449 Chronic obstructive pulmonary disease, unspecified: Secondary | ICD-10-CM | POA: Diagnosis not present

## 2021-08-24 DIAGNOSIS — R627 Adult failure to thrive: Secondary | ICD-10-CM | POA: Diagnosis not present

## 2021-08-24 DIAGNOSIS — I130 Hypertensive heart and chronic kidney disease with heart failure and stage 1 through stage 4 chronic kidney disease, or unspecified chronic kidney disease: Secondary | ICD-10-CM | POA: Diagnosis not present

## 2021-08-24 DIAGNOSIS — Z9981 Dependence on supplemental oxygen: Secondary | ICD-10-CM | POA: Diagnosis not present

## 2021-08-24 DIAGNOSIS — E1122 Type 2 diabetes mellitus with diabetic chronic kidney disease: Secondary | ICD-10-CM | POA: Diagnosis not present

## 2021-08-24 DIAGNOSIS — I5032 Chronic diastolic (congestive) heart failure: Secondary | ICD-10-CM | POA: Diagnosis not present

## 2021-08-24 DIAGNOSIS — Z9181 History of falling: Secondary | ICD-10-CM | POA: Diagnosis not present

## 2021-08-24 DIAGNOSIS — D631 Anemia in chronic kidney disease: Secondary | ICD-10-CM | POA: Diagnosis not present

## 2021-08-24 MED ORDER — MEMANTINE HCL 10 MG PO TABS
10.0000 mg | ORAL_TABLET | Freq: Every day | ORAL | 0 refills | Status: DC
Start: 2021-08-24 — End: 2021-09-17

## 2021-08-24 NOTE — Progress Notes (Signed)
[facility]  PATIENT: Leslie Reeves DOB: [birth year]  REQUESTING CLINICIAN: [name], FNP HISTORY FROM: Patient and daughter  REASON FOR VISIT: Memory decline    HISTORICAL  CHIEF COMPLAINT:  Chief Complaint  Patient presents with   New Patient (Initial Visit)    Rm 13. Accompanied by daughter. NP internal referral for Ataxia and memory changes. Pt's daughter states has poor muscle control in her legs.     HISTORY OF PRESENT ILLNESS:  This is a 80 year old woman past medical history of COPD, diastolic heart failure, previous CVA, CKD stage IV and prediabetes who is presenting for memory problem.  Patient denies currently any complaints, not sure why she is here.  Daughter who accompanies patient today stated that she has problem remembering for the past year, she had difficulty remembering recent conversation.  Patient lives at home with her Daughter and sleeps most of the day.  Daughter helps her with feeding, clothing and taking her to her appointment. Daughter also handle all her bills. She was recently admitted and discharged on 3 L supplemental oxygen.  Since using the home home oxygen, daughter has noted that she is more awake and her memory is better. Another condition brought up today was her increased weakness, daughter said the patient is very weak in the lower extremity and now she had difficulty walking.  No recent falls.     OTHER MEDICAL CONDITIONS: COPD, diastolic heart failure, CVA, CKD stage IV, prediabetes.   REVIEW OF SYSTEMS: Full 14 system review of systems performed and negative with exception of: unable to fully obtain  ALLERGIES: Allergies  Allergen Reactions   Penicillin G Rash   Quinapril Other (See Comments)   Penicillins Rash    HOME MEDICATIONS: Outpatient Medications Prior to Visit  Medication Sig Dispense Refill   Accu-Chek Softclix Lancets lancets Use as instructed 100 each 12   acetaminophen (TYLENOL) 325 MG tablet  Take 2 tablets (650 mg total) by mouth every 6 (six) hours as needed for headache or mild pain.     albuterol (VENTOLIN HFA) 108 (90 Base) MCG/ACT inhaler INHALE 2 PUFFS BY MOUTH 4 TIMES A DAY (Patient taking differently: Inhale 2 puffs into the lungs every 6 (six) hours as needed for shortness of breath or wheezing.) 20.1 each 3   blood glucose meter kit and supplies KIT Dispense based on patient and insurance preference. Use up to four times daily as directed. (FOR ICD-9 250.00, 250.01). 1 each 0   diclofenac sodium (VOLTAREN) 1 % GEL Apply 2 g topically 4 (four) times daily. 100 g 1   fluticasone-salmeterol (WIXELA INHUB) 100-50 MCG/ACT AEPB TAKE 1 PUFF BY MOUTH EVERY 12 HOURS IN THE MORNING AND IN THE EVENING (Patient taking differently: Inhale 1 puff into the lungs 2 (two) times daily.) 180 each 11   glucose blood (ACCU-CHEK AVIVA PLUS) test strip Use as instructed 300 each 3   isosorbide-hydrALAZINE (BIDIL) 20-37.5 MG tablet Take 1 tablet by mouth 3 (three) times daily. 30 tablet 0   levothyroxine (SYNTHROID) 50 MCG tablet Take 1 tablet (50 mcg total) by mouth daily. 30 tablet 2   megestrol (MEGACE ES) 625 MG/5ML suspension TAKE 5 MLS (625 MG TOTAL) BY MOUTH DAILY. 150 mL 1   metoprolol succinate (TOPROL-XL) 25 MG 24 hr tablet Take 12.5 mg by mouth daily.     mirtazapine (REMERON) 15 MG tablet TAKE 1 TABLET BY MOUTH EVERYDAY AT BEDTIME (Patient taking differently: Take 15 mg by mouth at bedtime.) 90  tablet 1   rosuvastatin (CRESTOR) 20 MG tablet TAKE 1 TABLET BY MOUTH EVERY DAY (Patient taking differently: Take 20 mg by mouth daily.) 90 tablet 0   Multiple Vitamin (MULTIVITAMIN WITH MINERALS) TABS tablet Take 1 tablet by mouth daily. (Patient not taking: Reported on 08/13/2021)     No facility-administered medications prior to visit.    PAST MEDICAL HISTORY: Past Medical History:  Diagnosis Date   Allergy    Diabetes mellitus without complication ([place])    Hypertension     PAST SURGICAL  HISTORY: Past Surgical History:  Procedure Laterality Date   ABDOMINAL HYSTERECTOMY      FAMILY HISTORY: Family History  Problem Relation Age of Onset   Heart disease Other    Stroke Other    Hypertension Other    COPD Other    Cancer Other    Asthma Other    Breast cancer Neg Hx     SOCIAL HISTORY: Social History   Socioeconomic History   Marital status: Married    Spouse name: Not on file   Number of children: 2   Years of education: Not on file   Highest education level: High school graduate  Occupational History   Occupation: retired  Tobacco Use   Smoking status: Former    Packs/day: 0.50    Years: 50.00    Pack years: 25.00    Types: Cigarettes    Quit date: 2017    Years since quitting: 5.9   Smokeless tobacco: Never  Vaping Use   Vaping Use: Never used  Substance and Sexual Activity   Alcohol use: Not Currently   Drug use: Not Currently   Sexual activity: Not Currently  Other Topics Concern   Not on file  Social History Narrative   Not on file   Social Determinants of Health   Financial Resource Strain: Low Risk    Difficulty of Paying Living Expenses: Not hard at all  Food Insecurity: No Food Insecurity   Worried About Charity fundraiser in the Last Year: Never true   [place] in the Last Year: Never true  Transportation Needs: No Transportation Needs   Lack of Transportation (Medical): No   Lack of Transportation (Non-Medical): No  Physical Activity: Inactive   Days of Exercise per Week: 0 days   Minutes of Exercise per Session: 0 min  Stress: No Stress Concern Present   Feeling of Stress : Not at all  Social Connections: Not on file  Intimate Partner Violence: Not on file    PHYSICAL EXAM  GENERAL EXAM/CONSTITUTIONAL: Vitals:  Vitals:   08/24/21 1429  BP: (!) 183/65  Pulse: 66  Weight: 97 lb 8 oz (44.2 kg)  Height: _0  (1.575 m)   Body mass index is 17.83 kg/m. Wt Readings from Last 3 Encounters:  08/24/21 97 lb 8  oz (44.2 kg)  08/13/21 94 lb 3.2 oz (42.7 kg)  07/24/21 96 lb 9 oz (43.8 kg)   Patient is in no distress; well developed, nourished and groomed; neck is supple   EYES: Pupils round and reactive to light, Visual fields full to confrontation, Extraocular movements intacts,   MUSCULOSKELETAL: Gait, strength, tone, movements noted in Neurologic exam below  NEUROLOGIC: MENTAL STATUS:  MMSE - [place] Exam 08/24/2021  Orientation to time 2  Orientation to Place 5  Registration 3  Attention/ Calculation 0  Recall 0  Language- name 2 objects 2  Language- repeat 1  Language- follow 3  step command 3  Language- read & follow direction 0  Write a sentence 1  Copy design 0  Total score 17   awake, alert, oriented to person, place and time  CRANIAL NERVE:  2nd, 3rd, 4th, 6th - pupils equal and reactive to light, visual fields full to confrontation, extraocular muscles intact, no nystagmus 5th - facial sensation symmetric 7th - facial strength symmetric 8th - hearing intact 9th - palate elevates symmetrically, uvula midline 11th - shoulder shrug symmetric 12th - tongue protrusion midline  MOTOR:  normal bulk and tone, full strength in the BUE, BLE  SENSORY:  normal and symmetric to light touch, vibration  COORDINATION:  finger-nose-finger normal   REFLEXES:  deep tendon reflexes present and symmetric  GAIT/STATION:  Deferred (in a wheelchair)      DIAGNOSTIC DATA (LABS, IMAGING, TESTING) - I reviewed patient records, labs, notes, testing and imaging myself where available.  Lab Results  Component Value Date   WBC 8.4 07/29/2021   HGB 10.2 (L) 07/29/2021   HCT 32.0 (L) 07/29/2021   MCV 90 07/29/2021   PLT 444 07/29/2021      Component Value Date/Time   NA 147 (H) 07/29/2021 1227   K 5.7 (H) 07/29/2021 1227   CL 114 (H) 07/29/2021 1227   CO2 14 (L) 07/29/2021 1227   GLUCOSE 101 (H) 07/29/2021 1227   GLUCOSE 124 (H) 07/24/2021 0118   BUN 68 (H)  07/29/2021 1227   CREATININE 1.92 (H) 07/29/2021 1227   CALCIUM 9.8 07/29/2021 1227   PROT 6.5 07/29/2021 1227   ALBUMIN 3.3 (L) 07/29/2021 1227   AST 50 (H) 07/29/2021 1227   ALT 41 (H) 07/29/2021 1227   ALKPHOS 51 07/29/2021 1227   BILITOT 0.4 07/29/2021 1227   GFRNONAA 20 (L) 07/24/2021 0118   GFRAA 54 (L) 06/26/2020 0948   Lab Results  Component Value Date   CHOL 158 11/21/2019   HDL 78 11/21/2019   LDLCALC 66 11/21/2019   TRIG 70 11/21/2019   CHOLHDL 2.0 11/21/2019   Lab Results  Component Value Date   HGBA1C 5.8 (H) 06/18/2021   Lab Results  Component Value Date   OBSJGGEZ66 294 07/16/2021   Lab Results  Component Value Date   TSH 1.690 07/15/2021    Head CT 02/19/2021 Chronic appearing infarct in the left basal ganglia and periventricular white matter. Chronic small vessel disease. No acute intracranial abnormality.   ASSESSMENT AND PLAN  80 y.o. year old female with COPD, diastolic heart failure, previous CVA, CKD stage IV and prediabetes who is presenting for memory problem for the past 1 to 2 years.  Memory problem described as inability to remember previous conversation.  Daughter mentioned that patient spends most of the day sleeping therefore she does not interact much with her.  On Mini-Mental status exam today she has a score of 17 out of 30 consistent with memory impairment/dementia.  After discussion daughter agrees to try Namenda for 1 month, she will contact me if she finds the medication helpful for refill, otherwise she will discontinue after 1 month.  I will see her in 1 year for follow-up.  Her TSH and B12 was recently done in October and they were both within normal limits.   1. Alzheimer disease [facility])     PLAN: Start with Namenda 1/2 tab daily for one week then increase to full tab thereafter  Continue your other medications  Return in 1 year or sooner if worse   No orders of  the defined types were placed in this encounter.   Meds ordered  this encounter  Medications   memantine (NAMENDA) 10 MG tablet    Sig: Take 1 tablet (10 mg total) by mouth daily.    Dispense:  30 tablet    Refill:  0     Return in about 1 year (around 08/24/2022).    [name], MD 08/24/2021, 4:39 PM  [facility] [address], [place], [place] [address] [phone]

## 2021-08-24 NOTE — Addendum Note (Signed)
Addended byAlric Ran on: 08/24/2021 04:39 PM   Modules accepted: Level of Service

## 2021-08-24 NOTE — Patient Instructions (Signed)
Start with Namenda 1/2 tab daily for one week then increase to full tab thereafter  Continue your other medications  Return in 1 year or sooner if worse     There are well-accepted and sensible ways to reduce risk for Alzheimers disease and other degenerative brain disorders .  Exercise Daily Walk A daily 20 minute walk should be part of your routine. Disease related apathy can be a significant roadblock to exercise and the only way to overcome this is to make it a daily routine and perhaps have a reward at the end (something your loved one loves to eat or drink perhaps) or a personal trainer coming to the home can also be very useful. Most importantly, the patient is much more likely to exercise if the caregiver / spouse does it with him/her. In general a structured, repetitive schedule is best.  General Health: Any diseases which effect your body will effect your brain such as a pneumonia, urinary infection, blood clot, heart attack or stroke. Keep contact with your primary care doctor for regular follow ups.  Sleep. A good nights sleep is healthy for the brain. Seven hours is recommended. If you have insomnia or poor sleep habits we can give you some instructions. If you have sleep apnea wear your mask.  Diet: Eating a heart healthy diet is also a good idea; fish and poultry instead of red meat, nuts (mostly non-peanuts), vegetables, fruits, olive oil or canola oil (instead of butter), minimal salt (use other spices to flavor foods), whole grain rice, bread, cereal and pasta and wine in moderation.Research is now showing that the MIND diet, which is a combination of The Mediterranean diet and the DASH diet, is beneficial for cognitive processing and longevity. Information about this diet can be found in The MIND Diet, a book by Doyne Keel, MS, RDN, and online at [employer]bookDist[employer].si  Finances, Power of Attorney and Advance Directives: You should consider putting legal  safeguards in place with regard to financial and medical decision making. While the spouse always has power of attorney for medical and financial issues in the absence of any form, you should consider what you want in case the spouse / caregiver is no longer around or capable of making decisions.      Heart-head connection  New research shows there are things we can do to reduce the risk of mild cognitive impairment and dementia.  Several conditions known to increase the risk of cardiovascular disease -- such as high blood pressure, diabetes and high cholesterol -- also increase the risk of developing Alzheimer's. Some autopsy studies show that as many as 7 percent of individuals with Alzheimer's disease also have cardiovascular disease.  A longstanding question is why some people develop hallmark Alzheimer's plaques and tangles but do not develop the symptoms of Alzheimer's. Vascular disease may help researchers eventually find an answer. Some autopsy studies suggest that plaques and tangles may be present in the brain without causing symptoms of cognitive decline unless the brain also shows evidence of vascular disease. More research is needed to better understand the link between vascular health and Alzheimer's.  Physical exercise and diet Regular physical exercise may be a beneficial strategy to lower the risk of Alzheimer's and vascular dementia. Exercise may directly benefit brain cells by increasing blood and oxygen flow in the brain. Because of its known cardiovascular benefits, a medically approved exercise program is a valuable part of any overall wellness plan.  Current evidence suggests that heart-healthy eating may also  help protect the brain. Heart-healthy eating includes limiting the intake of sugar and saturated fats and making sure to eat plenty of fruits, vegetables, and whole grains. No one diet is best. Two diets that have been studied and may be beneficial are the DASH (Dietary  Approaches to Stop Hypertension) diet and the Mediterranean diet. The DASH diet emphasizes vegetables, fruits and fat-free or low-fat dairy products; includes whole grains, fish, poultry, beans, seeds, nuts and vegetable oils; and limits sodium, sweets, sugary beverages and red meats. A Mediterranean diet includes relatively little red meat and emphasizes whole grains, fruits and vegetables, fish and shellfish, and nuts, olive oil and other healthy fats.  Social connections and intellectual activity A number of studies indicate that maintaining strong social connections and keeping mentally active as we age might lower the risk of cognitive decline and Alzheimer's. Experts are not certain about the reason for this association. It may be due to direct mechanisms through which social and mental stimulation strengthen connections between nerve cells in the brain.  Head trauma There appears to be a strong link between future risk of Alzheimer's and serious head trauma, especially when injury involves loss of consciousness. You can help reduce your risk of Alzheimer's by protecting your head.  Wear a seat belt  Use a helmet when participating in sports  "Fall-proof" your home   What you can do now While research is not yet conclusive, certain lifestyle choices, such as physical activity and diet, may help support brain health and prevent Alzheimer's. Many of these lifestyle changes have been shown to lower the risk of other diseases, like heart disease and diabetes, which have been linked to Alzheimer's. With few drawbacks and plenty of known benefits, healthy lifestyle choices can improve your health and possibly protect your brain.  Learn more about brain health. You can help increase our knowledge by considering participation in a clinical study. Our free clinical trial matching services, TrialMatch, can help you find clinical trials in your area that are seeking volunteers.

## 2021-08-25 DIAGNOSIS — Z9981 Dependence on supplemental oxygen: Secondary | ICD-10-CM | POA: Diagnosis not present

## 2021-08-25 DIAGNOSIS — E1122 Type 2 diabetes mellitus with diabetic chronic kidney disease: Secondary | ICD-10-CM | POA: Diagnosis not present

## 2021-08-25 DIAGNOSIS — Z9181 History of falling: Secondary | ICD-10-CM | POA: Diagnosis not present

## 2021-08-25 DIAGNOSIS — Z8673 Personal history of transient ischemic attack (TIA), and cerebral infarction without residual deficits: Secondary | ICD-10-CM | POA: Diagnosis not present

## 2021-08-25 DIAGNOSIS — Z87891 Personal history of nicotine dependence: Secondary | ICD-10-CM | POA: Diagnosis not present

## 2021-08-25 DIAGNOSIS — I5032 Chronic diastolic (congestive) heart failure: Secondary | ICD-10-CM | POA: Diagnosis not present

## 2021-08-25 DIAGNOSIS — I130 Hypertensive heart and chronic kidney disease with heart failure and stage 1 through stage 4 chronic kidney disease, or unspecified chronic kidney disease: Secondary | ICD-10-CM | POA: Diagnosis not present

## 2021-08-25 DIAGNOSIS — R627 Adult failure to thrive: Secondary | ICD-10-CM | POA: Diagnosis not present

## 2021-08-25 DIAGNOSIS — I272 Pulmonary hypertension, unspecified: Secondary | ICD-10-CM | POA: Diagnosis not present

## 2021-08-25 DIAGNOSIS — D631 Anemia in chronic kidney disease: Secondary | ICD-10-CM | POA: Diagnosis not present

## 2021-08-25 DIAGNOSIS — N189 Chronic kidney disease, unspecified: Secondary | ICD-10-CM | POA: Diagnosis not present

## 2021-08-25 DIAGNOSIS — J449 Chronic obstructive pulmonary disease, unspecified: Secondary | ICD-10-CM | POA: Diagnosis not present

## 2021-08-25 DIAGNOSIS — E039 Hypothyroidism, unspecified: Secondary | ICD-10-CM | POA: Diagnosis not present

## 2021-08-26 DIAGNOSIS — Z8673 Personal history of transient ischemic attack (TIA), and cerebral infarction without residual deficits: Secondary | ICD-10-CM | POA: Diagnosis not present

## 2021-08-26 DIAGNOSIS — D631 Anemia in chronic kidney disease: Secondary | ICD-10-CM | POA: Diagnosis not present

## 2021-08-26 DIAGNOSIS — I5033 Acute on chronic diastolic (congestive) heart failure: Secondary | ICD-10-CM

## 2021-08-26 DIAGNOSIS — I130 Hypertensive heart and chronic kidney disease with heart failure and stage 1 through stage 4 chronic kidney disease, or unspecified chronic kidney disease: Secondary | ICD-10-CM | POA: Diagnosis not present

## 2021-08-26 DIAGNOSIS — E039 Hypothyroidism, unspecified: Secondary | ICD-10-CM | POA: Diagnosis not present

## 2021-08-26 DIAGNOSIS — E1169 Type 2 diabetes mellitus with other specified complication: Secondary | ICD-10-CM

## 2021-08-26 DIAGNOSIS — N183 Chronic kidney disease, stage 3 unspecified: Secondary | ICD-10-CM

## 2021-08-26 DIAGNOSIS — Z87891 Personal history of nicotine dependence: Secondary | ICD-10-CM | POA: Diagnosis not present

## 2021-08-26 DIAGNOSIS — E1122 Type 2 diabetes mellitus with diabetic chronic kidney disease: Secondary | ICD-10-CM | POA: Diagnosis not present

## 2021-08-26 DIAGNOSIS — E785 Hyperlipidemia, unspecified: Secondary | ICD-10-CM

## 2021-08-26 DIAGNOSIS — Z9181 History of falling: Secondary | ICD-10-CM | POA: Diagnosis not present

## 2021-08-26 DIAGNOSIS — Z9981 Dependence on supplemental oxygen: Secondary | ICD-10-CM | POA: Diagnosis not present

## 2021-08-26 DIAGNOSIS — R627 Adult failure to thrive: Secondary | ICD-10-CM | POA: Diagnosis not present

## 2021-08-26 DIAGNOSIS — J449 Chronic obstructive pulmonary disease, unspecified: Secondary | ICD-10-CM | POA: Diagnosis not present

## 2021-08-26 DIAGNOSIS — I5032 Chronic diastolic (congestive) heart failure: Secondary | ICD-10-CM | POA: Diagnosis not present

## 2021-08-26 DIAGNOSIS — N189 Chronic kidney disease, unspecified: Secondary | ICD-10-CM | POA: Diagnosis not present

## 2021-08-26 DIAGNOSIS — I272 Pulmonary hypertension, unspecified: Secondary | ICD-10-CM | POA: Diagnosis not present

## 2021-08-26 DIAGNOSIS — I129 Hypertensive chronic kidney disease with stage 1 through stage 4 chronic kidney disease, or unspecified chronic kidney disease: Secondary | ICD-10-CM

## 2021-08-27 ENCOUNTER — Other Ambulatory Visit: Payer: Self-pay

## 2021-08-27 ENCOUNTER — Telehealth: Payer: Self-pay

## 2021-08-27 ENCOUNTER — Other Ambulatory Visit: Payer: Medicare Other | Admitting: Hospice

## 2021-08-27 ENCOUNTER — Telehealth: Payer: Medicare Other

## 2021-08-27 DIAGNOSIS — R531 Weakness: Secondary | ICD-10-CM

## 2021-08-27 DIAGNOSIS — K5901 Slow transit constipation: Secondary | ICD-10-CM | POA: Diagnosis not present

## 2021-08-27 DIAGNOSIS — J449 Chronic obstructive pulmonary disease, unspecified: Secondary | ICD-10-CM

## 2021-08-27 DIAGNOSIS — E43 Unspecified severe protein-calorie malnutrition: Secondary | ICD-10-CM | POA: Diagnosis not present

## 2021-08-27 DIAGNOSIS — Z515 Encounter for palliative care: Secondary | ICD-10-CM

## 2021-08-27 DIAGNOSIS — I10 Essential (primary) hypertension: Secondary | ICD-10-CM | POA: Diagnosis not present

## 2021-08-27 NOTE — Telephone Encounter (Signed)
  Care Management   Follow Up Note   08/27/2021 Name: Leslie Reeves MRN: [id number] DOB: [birth year]   Referred by: [name], FNP Reason for referral : Chronic Care Management (RN CM Follow up call )   An unsuccessful telephone outreach was attempted today. The patient was referred to the case management team for assistance with care management and care coordination.   Follow Up Plan: A HIPPA compliant phone message was left for the patient providing contact information and requesting a return call.   Barb Merino, RN, BSN, CCM Care Management Coordinator [facility]  Direct Phone: [phone]

## 2021-08-27 NOTE — Telephone Encounter (Signed)
I left a message that Laurance Flatten, DNP, FNP-BC wanted to schedule the pt an appt for a blood pressure f/u because her blood pressure was elevated when she was at her neurologist appt.

## 2021-08-27 NOTE — Progress Notes (Signed)
[facility] Consult Note Telephone: [phone]  Fax: [phone]  PATIENT NAME: Leslie Reeves [id number] [phone] (home)  DOB: 05-Aug-1941 MRN: [id number]  PRIMARY CARE PROVIDER:    [name], [place],  [address] [place] [address] [phone]  REFERRING PROVIDER:   [name], [address] [place],  [place] [address] [phone]  RESPONSIBLE PARTY:   Alla Feeling Information     Name Relation Home Work [place] Daughter [phone]  [phone]        I met face to face with patient and family at home. Palliative Care was asked to follow this patient by consultation request of  [name], FNP to address advance care planning, complex medical decision making and goals of care clarification. This is the initial visit.    ASSESSMENT AND / RECOMMENDATIONS:   Advance Care Planning: Our advance care planning conversation included a discussion about:    The value and importance of advance care planning  Difference between Hospice and Palliative care Exploration of goals of care in the event of a sudden injury or illness  Identification and preparation of a healthcare agent  Review and updating or creation of an  advance directive document . Decision not to resuscitate or to de-escalate disease focused treatments due to poor prognosis.  CODE STATUS:Discussion on code status. Patient Leslie Reeves elect Partial code; patient wishes to have chest compressions with ACLS medications but no intubation and mechanical ventilation.  Goals of Care: Goals include to maximize quality of life and symptom management  I spent  46 minutes providing this initial consultation. More than 50% of the time in this consultation was spent on counseling patient and coordinating  communication. --------------------------------------------------------------------------------------------------------------------------------------  Symptom Management/Plan: COPD: Managed  with Wixela, Albuterol, Oxygen. Slow deep breathing, avoidance of irritant/triggers discussed. Pulmonologist consult as needed. Dementia: Memory loss and mild confusion in line with Dementia disease trajectory. FAST 6C. Encourage participation in activities, word search, word puzzle, reminiscence. Continue Memantine. CKD 4:Followed by Nephrologist. Education on limiting nephrotoxic substances, getting adequate hydration of 1200cc and reduced salt.  HTN: Amlodipine was discontinued in October when patient was in the hospital, currently on Metoprolol. Leslie Reeves reports BP consistently elevated >720 systolic in the past weeks.  Recommendation: Restart Amlodipine 3m and titrate up as needed to maintain good control. Leslie Pastelwill call PCP to reorder. Cardiologist is consult also recommended. Weakness: PT/OT is ongoing Protein Caloric Malnutrition: Encourage small meals 4-6 times a day. Continue Glucerna. Continue Mirtazapine as ordered. Monitor weight at home, report decline or progress. Routine CBC CMP. Constipation: OTC Miralax 17 gram mixed in 4-6 ounces of fluid daily, back off with diarrhea.   Follow up: Palliative care will continue to follow for complex medical decision making, advance care planning, and clarification of goals. Return 6 weeks or prn.Encouraged to call provider sooner with any concerns.   Family /Caregiver/Community Supports: Patient lives at home with Leslie Pasteland her family.  HOSPICE ELIGIBILITY/DIAGNOSIS: TBD  Chief Complaint: Initial Palliative care visit  HISTORY OF PRESENT ILLNESS:  Leslie STOERMERis a 80y.o. year old female  with multiple medical conditions COPD with associated shortness of breath worse on exertion, currently on oxygen  which is helpful. History of CKD 4,  previous  CVA, CHF, HTN. Patient denies pain/discomfort, palpitations/chest pain; she is on oxygen with no respiratory distress. Leslie Pastelconcerned with patient's consistent high BP, though patient is asymptomatic.  She expressed concern that her Amlodipine was not restarted after her last hospitalization in Oct 2022 and that her BP has been up since then.  History obtained from review of EMR, discussion with primary team, caregiver, family and/or Leslie Reeves.  Review and summarization of Epic records shows history from other than patient. Rest of 10 point ROS asked and negative.  I reviewed, as needed, available labs, patient records, imaging, studies and related documents from the EMR.   ROS General: NAD EYES: denies vision changes ENMT: denies dysphagia Cardiovascular: denies chest pain/discomfort Pulmonary: denies cough, endorses occasional SOB on exertion Abdomen: endorses good appetite, denies constipation/diarrhea GU: denies dysuria, urinary frequency MSK:  endorses weakness,  no falls reported Skin: denies rashes or wounds Neurological: denies pain, denies insomnia Psych: Endorses positive mood Heme/lymph/immuno: denies bruises, abnormal bleeding  Physical Exam: Height/Weight: 5 feet 2 inches/84 Ibs BP 170/70, asymptomatic, Daughter reports this has been about her daily readings for patient. R 18 O2 97% on 3L/Min P 70. Plan is to call PCP to refill Amlodipine today and recheck BP later in the day; monitor patient closely and seek medical help if needed. Constitutional: NAD General: Well groomed, cooperative, in no distress EYES: anicteric sclera, lids intact, no discharge  ENMT: Moist mucous membrane CV: S1 S2, RRR, no LE edema Pulmonary: LCTA, no increased work of breathing, no cough, Abdomen: active BS + 4 quadrants, soft and non tender GU: no suprapubic tenderness MSK: weakness, sarcopenia, limited ROM, Skin: warm and dry, no rashes or wounds on visible skin Neuro:  weakness, otherwise  non focal Psych: non-anxious affect Hem/lymph/immuno: no widespread bruising   PAST MEDICAL HISTORY:  Active Ambulatory Problems    Diagnosis Date Noted   Right thyroid nodule 10/09/2018   Decreased appetite 02/05/2019   Prediabetes 02/05/2019   Essential hypertension 02/05/2019   Solitary pulmonary nodule 05/05/2020   Mild intermittent asthma 05/05/2020   DM2 (diabetes mellitus, type 2) ([facility]) 06/12/2021   AKI (acute kidney injury) ([facility]) 06/12/2021   New onset of congestive heart failure ([facility]) 06/12/2021   Chronic kidney disease (CKD) stage G3a/A1, moderately decreased glomerular filtration rate (GFR) between 45-59 mL/min/1.73 square meter and albuminuria creatinine ratio less than 30 mg/g ([facility]) 06/13/2021   Acute on chronic diastolic CHF (congestive heart failure), NYHA class 3 ([facility]) 07/15/2021   Hypothyroidism 07/16/2021   Type 2 diabetes mellitus with hyperlipidemia ([facility]) 07/16/2021   Failure to thrive in adult 07/16/2021   COPD (chronic obstructive pulmonary disease) ([facility]) 07/16/2021   Protein-calorie malnutrition, severe 07/17/2021   Severe anemia 07/17/2021   Resolved Ambulatory Problems    Diagnosis Date Noted   No Resolved Ambulatory Problems   Past Medical History:  Diagnosis Date   Allergy    Diabetes mellitus without complication ([facility])    Hypertension     SOCIAL HX:  Social History   Tobacco Use   Smoking status: Former    Packs/day: 0.50    Years: 50.00    Pack years: 25.00    Types: Cigarettes    Quit date: 2017    Years since quitting: 5.9   Smokeless tobacco: Never  Substance Use Topics   Alcohol use: Not Currently     FAMILY HX:  Family History  Problem Relation Age of Onset   Heart disease Other    Stroke Other    Hypertension Other    COPD Other    Cancer Other    Asthma Other    Breast cancer Neg Hx  ALLERGIES:  Allergies  Allergen Reactions   Penicillin G Rash   Quinapril Other (See Comments)   Penicillins Rash       PERTINENT MEDICATIONS:  Outpatient Encounter Medications as of 08/27/2021  Medication Sig   Accu-Chek Softclix Lancets lancets Use as instructed   acetaminophen (TYLENOL) 325 MG tablet Take 2 tablets (650 mg total) by mouth every 6 (six) hours as needed for headache or mild pain.   albuterol (VENTOLIN HFA) 108 (90 Base) MCG/ACT inhaler INHALE 2 PUFFS BY MOUTH 4 TIMES A DAY (Patient taking differently: Inhale 2 puffs into the lungs every 6 (six) hours as needed for shortness of breath or wheezing.)   blood glucose meter kit and supplies KIT Dispense based on patient and insurance preference. Use up to four times daily as directed. (FOR ICD-9 250.00, 250.01).   diclofenac sodium (VOLTAREN) 1 % GEL Apply 2 g topically 4 (four) times daily.   fluticasone-salmeterol (WIXELA INHUB) 100-50 MCG/ACT AEPB TAKE 1 PUFF BY MOUTH EVERY 12 HOURS IN THE MORNING AND IN THE EVENING (Patient taking differently: Inhale 1 puff into the lungs 2 (two) times daily.)   glucose blood (ACCU-CHEK AVIVA PLUS) test strip Use as instructed   isosorbide-hydrALAZINE (BIDIL) 20-37.5 MG tablet Take 1 tablet by mouth 3 (three) times daily.   levothyroxine (SYNTHROID) 50 MCG tablet Take 1 tablet (50 mcg total) by mouth daily.   megestrol (MEGACE ES) 625 MG/5ML suspension TAKE 5 MLS (625 MG TOTAL) BY MOUTH DAILY.   memantine (NAMENDA) 10 MG tablet Take 1 tablet (10 mg total) by mouth daily.   metoprolol succinate (TOPROL-XL) 25 MG 24 hr tablet Take 12.5 mg by mouth daily.   mirtazapine (REMERON) 15 MG tablet TAKE 1 TABLET BY MOUTH EVERYDAY AT BEDTIME (Patient taking differently: Take 15 mg by mouth at bedtime.)   rosuvastatin (CRESTOR) 20 MG tablet TAKE 1 TABLET BY MOUTH EVERY DAY (Patient taking differently: Take 20 mg by mouth daily.)   No facility-administered encounter medications on file as of 08/27/2021.     Thank you for the opportunity to participate in the care of Leslie Reeves.  The palliative care team will continue to  follow. Please call our office at [phone] if we can be of additional assistance.   Note: Portions of this note were generated with [employer]. Dictation errors may occur despite best attempts at proofreading.  Teodoro Spray, NP

## 2021-08-28 ENCOUNTER — Other Ambulatory Visit: Payer: Self-pay | Admitting: Nurse Practitioner

## 2021-08-28 DIAGNOSIS — R630 Anorexia: Secondary | ICD-10-CM

## 2021-08-28 DIAGNOSIS — R634 Abnormal weight loss: Secondary | ICD-10-CM

## 2021-08-31 DIAGNOSIS — J449 Chronic obstructive pulmonary disease, unspecified: Secondary | ICD-10-CM | POA: Diagnosis not present

## 2021-08-31 DIAGNOSIS — E039 Hypothyroidism, unspecified: Secondary | ICD-10-CM | POA: Diagnosis not present

## 2021-08-31 DIAGNOSIS — Z8673 Personal history of transient ischemic attack (TIA), and cerebral infarction without residual deficits: Secondary | ICD-10-CM | POA: Diagnosis not present

## 2021-08-31 DIAGNOSIS — E1122 Type 2 diabetes mellitus with diabetic chronic kidney disease: Secondary | ICD-10-CM | POA: Diagnosis not present

## 2021-08-31 DIAGNOSIS — I272 Pulmonary hypertension, unspecified: Secondary | ICD-10-CM | POA: Diagnosis not present

## 2021-08-31 DIAGNOSIS — Z87891 Personal history of nicotine dependence: Secondary | ICD-10-CM | POA: Diagnosis not present

## 2021-08-31 DIAGNOSIS — Z9181 History of falling: Secondary | ICD-10-CM | POA: Diagnosis not present

## 2021-08-31 DIAGNOSIS — I5032 Chronic diastolic (congestive) heart failure: Secondary | ICD-10-CM | POA: Diagnosis not present

## 2021-08-31 DIAGNOSIS — N189 Chronic kidney disease, unspecified: Secondary | ICD-10-CM | POA: Diagnosis not present

## 2021-08-31 DIAGNOSIS — D631 Anemia in chronic kidney disease: Secondary | ICD-10-CM | POA: Diagnosis not present

## 2021-08-31 DIAGNOSIS — I130 Hypertensive heart and chronic kidney disease with heart failure and stage 1 through stage 4 chronic kidney disease, or unspecified chronic kidney disease: Secondary | ICD-10-CM | POA: Diagnosis not present

## 2021-08-31 DIAGNOSIS — R627 Adult failure to thrive: Secondary | ICD-10-CM | POA: Diagnosis not present

## 2021-08-31 DIAGNOSIS — Z9981 Dependence on supplemental oxygen: Secondary | ICD-10-CM | POA: Diagnosis not present

## 2021-09-02 ENCOUNTER — Encounter: Payer: Self-pay | Admitting: Nurse Practitioner

## 2021-09-02 ENCOUNTER — Ambulatory Visit (INDEPENDENT_AMBULATORY_CARE_PROVIDER_SITE_OTHER): Payer: Medicare Other | Admitting: Nurse Practitioner

## 2021-09-02 ENCOUNTER — Other Ambulatory Visit: Payer: Self-pay

## 2021-09-02 VITALS — BP 138/72 | HR 71 | Temp 98.1°F | Ht 62.0 in | Wt 99.6 lb

## 2021-09-02 DIAGNOSIS — H6123 Impacted cerumen, bilateral: Secondary | ICD-10-CM

## 2021-09-02 DIAGNOSIS — Z87891 Personal history of nicotine dependence: Secondary | ICD-10-CM | POA: Diagnosis not present

## 2021-09-02 DIAGNOSIS — Z23 Encounter for immunization: Secondary | ICD-10-CM

## 2021-09-02 DIAGNOSIS — E785 Hyperlipidemia, unspecified: Secondary | ICD-10-CM | POA: Diagnosis not present

## 2021-09-02 DIAGNOSIS — Z681 Body mass index (BMI) 19 or less, adult: Secondary | ICD-10-CM

## 2021-09-02 DIAGNOSIS — I129 Hypertensive chronic kidney disease with stage 1 through stage 4 chronic kidney disease, or unspecified chronic kidney disease: Secondary | ICD-10-CM

## 2021-09-02 DIAGNOSIS — E039 Hypothyroidism, unspecified: Secondary | ICD-10-CM

## 2021-09-02 DIAGNOSIS — N189 Chronic kidney disease, unspecified: Secondary | ICD-10-CM | POA: Diagnosis not present

## 2021-09-02 DIAGNOSIS — D631 Anemia in chronic kidney disease: Secondary | ICD-10-CM | POA: Diagnosis not present

## 2021-09-02 DIAGNOSIS — R627 Adult failure to thrive: Secondary | ICD-10-CM | POA: Diagnosis not present

## 2021-09-02 DIAGNOSIS — Z9981 Dependence on supplemental oxygen: Secondary | ICD-10-CM | POA: Diagnosis not present

## 2021-09-02 DIAGNOSIS — E1169 Type 2 diabetes mellitus with other specified complication: Secondary | ICD-10-CM

## 2021-09-02 DIAGNOSIS — J449 Chronic obstructive pulmonary disease, unspecified: Secondary | ICD-10-CM | POA: Diagnosis not present

## 2021-09-02 DIAGNOSIS — R630 Anorexia: Secondary | ICD-10-CM

## 2021-09-02 DIAGNOSIS — I130 Hypertensive heart and chronic kidney disease with heart failure and stage 1 through stage 4 chronic kidney disease, or unspecified chronic kidney disease: Secondary | ICD-10-CM | POA: Diagnosis not present

## 2021-09-02 DIAGNOSIS — Z8673 Personal history of transient ischemic attack (TIA), and cerebral infarction without residual deficits: Secondary | ICD-10-CM | POA: Diagnosis not present

## 2021-09-02 DIAGNOSIS — N183 Chronic kidney disease, stage 3 unspecified: Secondary | ICD-10-CM

## 2021-09-02 DIAGNOSIS — I5032 Chronic diastolic (congestive) heart failure: Secondary | ICD-10-CM | POA: Diagnosis not present

## 2021-09-02 DIAGNOSIS — Z9181 History of falling: Secondary | ICD-10-CM | POA: Diagnosis not present

## 2021-09-02 DIAGNOSIS — E1122 Type 2 diabetes mellitus with diabetic chronic kidney disease: Secondary | ICD-10-CM

## 2021-09-02 DIAGNOSIS — I272 Pulmonary hypertension, unspecified: Secondary | ICD-10-CM | POA: Diagnosis not present

## 2021-09-02 MED ORDER — MIRTAZAPINE 30 MG PO TABS: 30.0000 mg | ORAL_TABLET | Freq: Every day | ORAL | 2 refills | Status: DC

## 2021-09-02 MED ORDER — AMLODIPINE BESYLATE 10 MG PO TABS: 10.0000 mg | ORAL_TABLET | Freq: Every day | ORAL | 11 refills | Status: DC

## 2021-09-02 NOTE — Patient Instructions (Addendum)
Hypertension, Adult Hypertension is another name for high blood pressure. High blood pressure forces your heart to work harder to pump blood. This can cause problems over time. There are two numbers in a blood pressure reading. There is a top number (systolic) over a bottom number (diastolic). It is best to have a blood pressure that is below 120/80. Healthy choices can help lower your blood pressure, or you may need medicine to help lower it. What are the causes? The cause of this condition is not known. Some conditions may be related to high blood pressure. What increases the risk? Smoking. Having type 2 diabetes mellitus, high cholesterol, or both. Not getting enough exercise or physical activity. Being overweight. Having too much fat, sugar, calories, or salt (sodium) in your diet. Drinking too much alcohol. Having long-term (chronic) kidney disease. Having a family history of high blood pressure. Age. Risk increases with age. Race. You may be at higher risk if you are African American. Gender. Men are at higher risk than women before age 27. After age 24, women are at higher risk than men. Having obstructive sleep apnea. Stress. What are the signs or symptoms? High blood pressure may not cause symptoms. Very high blood pressure (hypertensive crisis) may cause: Headache. Feelings of worry or nervousness (anxiety). Shortness of breath. Nosebleed. A feeling of being sick to your stomach (nausea). Throwing up (vomiting). Changes in how you see. Very bad chest pain. Seizures. How is this treated? This condition is treated by making healthy lifestyle changes, such as: Eating healthy foods. Exercising more. Drinking less alcohol. Your health care provider may prescribe medicine if lifestyle changes are not enough to get your blood pressure under control, and if: Your top number is above 130. Your bottom number is above 80. Your personal target blood pressure may vary. Follow  these instructions at home: Eating and drinking  If told, follow the DASH eating plan. To follow this plan: Fill one half of your plate at each meal with fruits and vegetables. Fill one fourth of your plate at each meal with whole grains. Whole grains include whole-wheat pasta, brown rice, and whole-grain bread. Eat or drink low-fat dairy products, such as skim milk or low-fat yogurt. Fill one fourth of your plate at each meal with low-fat (lean) proteins. Low-fat proteins include fish, chicken without skin, eggs, beans, and tofu. Avoid fatty meat, cured and processed meat, or chicken with skin. Avoid pre-made or processed food. Eat less than 1,500 mg of salt each day. Do not drink alcohol if: Your doctor tells you not to drink. You are pregnant, may be pregnant, or are planning to become pregnant. If you drink alcohol: Limit how much you use to: 0-1 drink a day for women. 0-2 drinks a day for men. Be aware of how much alcohol is in your drink. In the [place] one drink equals one 12 oz bottle of beer (355 mL), one 5 oz glass of wine (148 mL), or one 1 oz glass of hard liquor (44 mL). Lifestyle  Work with your doctor to stay at a healthy weight or to lose weight. Ask your doctor what the best weight is for you. Get at least 30 minutes of exercise most days of the week. This may include walking, swimming, or biking. Get at least 30 minutes of exercise that strengthens your muscles (resistance exercise) at least 3 days a week. This may include lifting weights or doing Pilates. Do not use any products that contain nicotine or tobacco, such  as cigarettes, e-cigarettes, and chewing tobacco. If you need help quitting, ask your doctor. Check your blood pressure at home as told by your doctor. Keep all follow-up visits as told by your doctor. This is important. Medicines Take over-the-counter and prescription medicines only as told by your doctor. Follow directions carefully. Do not skip doses of  blood pressure medicine. The medicine does not work as well if you skip doses. Skipping doses also puts you at risk for problems. Ask your doctor about side effects or reactions to medicines that you should watch for. Contact a doctor if you: Think you are having a reaction to the medicine you are taking. Have headaches that keep coming back (recurring). Feel dizzy. Have swelling in your ankles. Have trouble with your vision. Get help right away if you: Get a very bad headache. Start to feel mixed up (confused). Feel weak or numb. Feel faint. Have very bad pain in your: Chest. Belly (abdomen). Throw up more than once. Have trouble breathing. Summary Hypertension is another name for high blood pressure. High blood pressure forces your heart to work harder to pump blood. For most people, a normal blood pressure is less than 120/80. Making healthy choices can help lower blood pressure. If your blood pressure does not get lower with healthy choices, you may need to take medicine. This information is not intended to replace advice given to you by your health care provider. Make sure you discuss any questions you have with your health care provider. Document Revised: 05/24/2018 Document Reviewed: 05/24/2018 Elsevier Patient Education  2022 [place], Adult The ears produce a substance called earwax that helps keep bacteria out of the ear and protects the skin in the ear canal. Occasionally, earwax can build up in the ear and cause discomfort or hearing loss. What are the causes? This condition is caused by a buildup of earwax. Ear canals are self-cleaning. Ear wax is made in the outer part of the ear canal and generally falls out in small amounts over time. When the self-cleaning mechanism is not working, earwax builds up and can cause decreased hearing and discomfort. Attempting to clean ears with cotton swabs can push the earwax deep into the ear canal and cause  decreased hearing and pain. What increases the risk? This condition is more likely to develop in people who: Clean their ears often with cotton swabs. Pick at their ears. Use earplugs or in-ear headphones often, or wear hearing aids. The following factors may also make you more likely to develop this condition: Being female. Being of older age. Naturally producing more earwax. Having narrow ear canals. Having earwax that is overly thick or sticky. Having excess hair in the ear canal. Having eczema. Being dehydrated. What are the signs or symptoms? Symptoms of this condition include: Reduced or muffled hearing. A feeling of fullness in the ear or feeling that the ear is plugged. Fluid coming from the ear. Ear pain or an itchy ear. Ringing in the ear. Coughing. Balance problems. An obvious piece of earwax that can be seen inside the ear canal. How is this diagnosed? This condition may be diagnosed based on: Your symptoms. Your medical history. An ear exam. During the exam, your health care provider will look into your ear with an instrument called an otoscope. You may have tests, including a hearing test. How is this treated? This condition may be treated by: Using ear drops to soften the earwax. Having the earwax removed by a health  care provider. The health care provider may: Flush the ear with water. Use an instrument that has a loop on the end (curette). Use a suction device. Having surgery to remove the wax buildup. This may be done in severe cases. Follow these instructions at home:  Take over-the-counter and prescription medicines only as told by your health care provider. Do not put any objects, including cotton swabs, into your ear. You can clean the opening of your ear canal with a washcloth or facial tissue. Follow instructions from your health care provider about cleaning your ears. Do not overclean your ears. Drink enough fluid to keep your urine pale yellow. This  will help to thin the earwax. Keep all follow-up visits as told. If earwax builds up in your ears often or if you use hearing aids, consider seeing your health care provider for routine, preventive ear cleanings. Ask your health care provider how often you should schedule your cleanings. If you have hearing aids, clean them according to instructions from the manufacturer and your health care provider. Contact a health care provider if: You have ear pain. You develop a fever. You have pus or other fluid coming from your ear. You have hearing loss. You have ringing in your ears that does not go away. You feel like the room is spinning (vertigo). Your symptoms do not improve with treatment. Get help right away if: You have bleeding from the affected ear. You have severe ear pain. Summary Earwax can build up in the ear and cause discomfort or hearing loss. The most common symptoms of this condition include reduced or muffled hearing, a feeling of fullness in the ear, or feeling that the ear is plugged. This condition may be diagnosed based on your symptoms, your medical history, and an ear exam. This condition may be treated by using ear drops to soften the earwax or by having the earwax removed by a health care provider. Do not put any objects, including cotton swabs, into your ear. You can clean the opening of your ear canal with a washcloth or facial tissue. This information is not intended to replace advice given to you by your health care provider. Make sure you discuss any questions you have with your health care provider. Document Revised: 01/01/2020 Document Reviewed: 01/01/2020 Elsevier Patient Education  [place].  Influenza (Flu) Vaccine (Inactivated or Recombinant): What You Need to Know 1. Why get vaccinated? Influenza vaccine can prevent influenza (flu). Flu is a contagious disease that spreads around the [place] every year, usually between October and May. Anyone  can get the flu, but it is more dangerous for some people. Infants and young children, people 54 years and older, pregnant people, and people with certain health conditions or a weakened immune system are at greatest risk of flu complications. Pneumonia, bronchitis, sinus infections, and ear infections are examples of flu-related complications. If you have a medical condition, such as heart disease, cancer, or diabetes, flu can make it worse. Flu can cause fever and chills, sore throat, muscle aches, fatigue, cough, headache, and runny or stuffy nose. Some people may have vomiting and diarrhea, though this is more common in children than adults. In an average year, thousands of people in the [place] die from flu, and many more are hospitalized. Flu vaccine prevents millions of illnesses and flu-related visits to the doctor each year. 2. Influenza vaccines CDC recommends everyone 6 months and older get vaccinated every flu season. Children 6 months through 8 years  of age may need 2 doses during a single flu season. Everyone else needs only 1 dose each flu season. It takes about 2 weeks for protection to develop after vaccination. There are many flu viruses, and they are always changing. Each year a new flu vaccine is made to protect against the influenza viruses believed to be likely to cause disease in the upcoming flu season. Even when the vaccine doesn't exactly match these viruses, it may still provide some protection. Influenza vaccine does not cause flu. Influenza vaccine may be given at the same time as other vaccines. 3. Talk with your health care provider Tell your vaccination provider if the person getting the vaccine: Has had an allergic reaction after a previous dose of influenza vaccine, or has any severe, life-threatening allergies Has ever had Guillain-Barr Syndrome (also called "GBS") In some cases, your health care provider may decide to postpone influenza vaccination until a  future visit. Influenza vaccine can be administered at any time during pregnancy. People who are or will be pregnant during influenza season should receive inactivated influenza vaccine. People with minor illnesses, such as a cold, may be vaccinated. People who are moderately or severely ill should usually wait until they recover before getting influenza vaccine. Your health care provider can give you more information. 4. Risks of a vaccine reaction Soreness, redness, and swelling where the shot is given, fever, muscle aches, and headache can happen after influenza vaccination. There may be a very small increased risk of Guillain-Barr Syndrome (GBS) after inactivated influenza vaccine (the flu shot). Young children who get the flu shot along with pneumococcal vaccine (PCV13) and/or DTaP vaccine at the same time might be slightly more likely to have a seizure caused by fever. Tell your health care provider if a child who is getting flu vaccine has ever had a seizure. People sometimes faint after medical procedures, including vaccination. Tell your provider if you feel dizzy or have vision changes or ringing in the ears. As with any medicine, there is a very remote chance of a vaccine causing a severe allergic reaction, other serious injury, or death. 5. What if there is a serious problem? An allergic reaction could occur after the vaccinated person leaves the clinic. If you see signs of a severe allergic reaction (hives, swelling of the face and throat, difficulty breathing, a fast heartbeat, dizziness, or weakness), call 9-1-1 and get the person to the nearest hospital. For other signs that concern you, call your health care provider. Adverse reactions should be reported to the Vaccine Adverse Event Reporting System (VAERS). Your health care provider will usually file this report, or you can do it yourself. Visit the VAERS website at www.vaers.SamedayNews.es or call 380-079-4518. VAERS is only for reporting  reactions, and VAERS staff members do not give medical advice. 6. The National Vaccine Injury Compensation Program The [employer] Vaccine Injury Compensation Program (VICP) is a federal program that was created to compensate people who may have been injured by certain vaccines. Claims regarding alleged injury or death due to vaccination have a time limit for filing, which may be as short as two years. Visit the VICP website at GoldCloset.com.ee or call (203)702-4001 to learn about the program and about filing a claim. 7. How can I learn more? Ask your health care provider. Call your local or state health department. Visit the website of the Food and Drug Administration (FDA) for vaccine package inserts and additional information at TraderRating.uy. Contact the Centers for Disease Control and Prevention (  CDC): Call 305 161 8147 (1-800-CDC-INFO) or Visit CDC's website at https://gibson.com/. Vaccine Information Statement Inactivated Influenza Vaccine (05/02/2020) This information is not intended to replace advice given to you by your health care provider. Make sure you discuss any questions you have with your health care provider. Document Revised: 06/04/2021 Document Reviewed: 06/04/2021 Elsevier Patient Education  2022 [place].   Use 1/2 water and 1/2 peroxide solution once a week to clean ears or over the counter debrox

## 2021-09-02 NOTE — Progress Notes (Signed)
This visit occurred during the SARS-CoV-2 public health emergency.  Safety protocols were in place, including screening questions prior to the visit, additional usage of staff PPE, and extensive cleaning of exam room while observing appropriate contact time as indicated for disinfecting solutions.  Subjective:     Patient ID: Leslie Reeves , female    DOB: 28-Mar-1941 , 80 y.o.   MRN: [id number]   Chief Complaint  Patient presents with   Hypertension    HPI  Patient presents today for a bp check. Pt was taken off of amlodipine 67m, she recently was put back on by nurse LEzzard Flax Pt daughter reports she did not take bp med before appointment this morning. The patient reports she feels fair. She is wearing her oxygen most times. She was started on metoprolol by the nephrologist.   Hypertension This is a chronic problem. The current episode started more than 1 year ago. The problem has been gradually improving since onset. The problem is controlled. Pertinent negatives include no anxiety, chest pain, headaches, palpitations or shortness of breath. Past treatments include calcium channel blockers. The current treatment provides significant improvement.    Past Medical History:  Diagnosis Date   Allergy    Diabetes mellitus without complication (HCC)    Hypertension      Family History  Problem Relation Age of Onset   Heart disease Other    Stroke Other    Hypertension Other    COPD Other    Cancer Other    Asthma Other    Breast cancer Neg Hx      Current Outpatient Medications:    Accu-Chek Softclix Lancets lancets, Use as instructed, Disp: 100 each, Rfl: 12   acetaminophen (TYLENOL) 325 MG tablet, Take 2 tablets (650 mg total) by mouth every 6 (six) hours as needed for headache or mild pain., Disp: , Rfl:    albuterol (VENTOLIN HFA) 108 (90 Base) MCG/ACT inhaler, INHALE 2 PUFFS BY MOUTH 4 TIMES A DAY (Patient taking differently: Inhale 2 puffs into the lungs every 6 (six) hours  as needed for shortness of breath or wheezing.), Disp: 20.1 each, Rfl: 3   amLODipine (NORVASC) 10 MG tablet, Take 1 tablet (10 mg total) by mouth daily., Disp: 30 tablet, Rfl: 11   blood glucose meter kit and supplies KIT, Dispense based on patient and insurance preference. Use up to four times daily as directed. (FOR ICD-9 250.00, 250.01)., Disp: 1 each, Rfl: 0   diclofenac sodium (VOLTAREN) 1 % GEL, Apply 2 g topically 4 (four) times daily., Disp: 100 g, Rfl: 1   fluticasone-salmeterol (WIXELA INHUB) 100-50 MCG/ACT AEPB, TAKE 1 PUFF BY MOUTH EVERY 12 HOURS IN THE MORNING AND IN THE EVENING (Patient taking differently: Inhale 1 puff into the lungs 2 (two) times daily.), Disp: 180 each, Rfl: 11   glucose blood (ACCU-CHEK AVIVA PLUS) test strip, Use as instructed, Disp: 300 each, Rfl: 3   isosorbide-hydrALAZINE (BIDIL) 20-37.5 MG tablet, Take 1 tablet by mouth 3 (three) times daily., Disp: 30 tablet, Rfl: 0   memantine (NAMENDA) 10 MG tablet, Take 1 tablet (10 mg total) by mouth daily., Disp: 30 tablet, Rfl: 0   metoprolol succinate (TOPROL-XL) 25 MG 24 hr tablet, Take 12.5 mg by mouth daily., Disp: , Rfl:    rosuvastatin (CRESTOR) 20 MG tablet, TAKE 1 TABLET BY MOUTH EVERY DAY (Patient taking differently: Take 20 mg by mouth daily.), Disp: 90 tablet, Rfl: 0   levothyroxine (SYNTHROID) 75 MCG tablet, Take 1  tablet (75 mcg total) by mouth daily., Disp: 30 tablet, Rfl: 2   megestrol (MEGACE) 40 MG/ML suspension, TAKE 9.1 MLS (625 MG TOTAL) BY MOUTH DAILY. (Patient not taking: Reported on 09/02/2021), Disp: 275 mL, Rfl: 1   mirtazapine (REMERON) 30 MG tablet, Take 1 tablet (30 mg total) by mouth at bedtime., Disp: 30 tablet, Rfl: 2   Allergies  Allergen Reactions   Penicillin G Rash   Quinapril Other (See Comments)   Penicillins Rash     Review of Systems  Constitutional: Negative.   Respiratory: Negative.  Negative for shortness of breath.   Cardiovascular: Negative.  Negative for chest pain and  palpitations.  Neurological: Negative.  Negative for headaches.  Psychiatric/Behavioral: Negative.      Today's Vitals   09/02/21 0901  BP: 138/72  Pulse: 71  Temp: 98.1 F (36.7 C)  Weight: 99 lb 9.6 oz (45.2 kg)  Height: _0  (1.575 m)   Body mass index is 18.22 kg/m.  Wt Readings from Last 3 Encounters:  09/02/21 99 lb 9.6 oz (45.2 kg)  08/24/21 97 lb 8 oz (44.2 kg)  08/13/21 94 lb 3.2 oz (42.7 kg)    Objective:  Physical Exam Vitals reviewed.  Constitutional:      General: She is not in acute distress.    Appearance: Normal appearance.     Comments: Frail and thin appearance  HENT:     Right Ear: External ear normal. There is impacted cerumen.     Left Ear: External ear normal. There is impacted cerumen.  Cardiovascular:     Rate and Rhythm: Normal rate and regular rhythm.     Pulses: Normal pulses.     Heart sounds: Normal heart sounds. No murmur heard. Pulmonary:     Effort: Pulmonary effort is normal. No respiratory distress.     Breath sounds: Normal breath sounds. No wheezing.  Neurological:     General: No focal deficit present.     Mental Status: She is alert and oriented to person, place, and time.     Cranial Nerves: No cranial nerve deficit.     Motor: No weakness.  Psychiatric:        Mood and Affect: Mood normal.        Behavior: Behavior normal.        Thought Content: Thought content normal.        Judgment: Judgment normal.        Assessment And Plan:     1. Benign hypertension with chronic kidney disease, stage III ([place]) Comments: Chronic, better controlled, will send new Rx for amlodipine. Will set her up with [facility] Pharmacist to help with medication reconciliation - amLODipine (NORVASC) 10 MG tablet; Take 1 tablet (10 mg total) by mouth daily.  Dispense: 30 tablet; Refill: 11  2. Bilateral impacted cerumen Water lavage to both ears with good results. Encouraged to use 1/2 water and 1/2 peroxide once a week as needed. - Ear Lavage  3.  Type 2 diabetes mellitus with hyperlipidemia (HCC) Comments: Stable, no labs this visit.   4. Chronic obstructive pulmonary disease, unspecified COPD type ([place]) Comments: Continues to wear oxygen as needed no labored breathing this visit  5. Acquired hypothyroidism Comments: Will check labs and make changes to medications pending results - TSH - T4 - T3, free  6. Decreased appetite Comments: Will increase dose of mirtazapine since she is no longer on Megace, encouraged daughter to give the patient what she likes - mirtazapine (REMERON) 30  MG tablet; Take 1 tablet (30 mg total) by mouth at bedtime.  Dispense: 30 tablet; Refill: 2  7. Immunization due Influenza vaccine administered Encouraged to take Tylenol as needed for fever or muscle aches. - Flu Vaccine QUAD High Dose(Fluad)     Patient was given opportunity to ask questions. Patient verbalized understanding of the plan and was able to repeat key elements of the plan. All questions were answered to their satisfaction.  [name], FNP   I, [name], FNP, have reviewed all documentation for this visit. The documentation on 09/02/21 for the exam, diagnosis, procedures, and orders are all accurate and complete.   IF YOU HAVE BEEN REFERRED TO A SPECIALIST, IT MAY TAKE 1-2 WEEKS TO SCHEDULE/PROCESS THE REFERRAL. IF YOU HAVE NOT HEARD FROM US/SPECIALIST IN TWO WEEKS, PLEASE GIVE [place] A CALL AT [phone].   THE PATIENT IS ENCOURAGED TO PRACTICE SOCIAL DISTANCING DUE TO THE COVID-19 PANDEMIC.

## 2021-09-03 ENCOUNTER — Other Ambulatory Visit: Payer: Self-pay

## 2021-09-03 ENCOUNTER — Other Ambulatory Visit: Payer: Self-pay | Admitting: Nurse Practitioner

## 2021-09-03 LAB — T4: T4, Total: 9.6 ug/dL (ref 4.5–12.0)

## 2021-09-03 LAB — T3, FREE: T3, Free: 1.6 pg/mL — ABNORMAL LOW (ref 2.0–4.4)

## 2021-09-03 LAB — TSH: TSH: 2.14 u[IU]/mL (ref 0.450–4.500)

## 2021-09-03 MED ORDER — LEVOTHYROXINE SODIUM 100 MCG PO TABS: 100.0000 ug | ORAL_TABLET | Freq: Every day | ORAL | 2 refills | Status: DC

## 2021-09-03 MED ORDER — LEVOTHYROXINE SODIUM 75 MCG PO TABS: 75.0000 ug | ORAL_TABLET | Freq: Every day | ORAL | 2 refills | Status: DC

## 2021-09-03 NOTE — Progress Notes (Signed)
I will increase to 75 mcg, I thought she told me she was taking 1.5 tabs of the 50 mcg.

## 2021-09-05 ENCOUNTER — Other Ambulatory Visit: Payer: Self-pay | Admitting: Nurse Practitioner

## 2021-09-08 DIAGNOSIS — Z9181 History of falling: Secondary | ICD-10-CM | POA: Diagnosis not present

## 2021-09-08 DIAGNOSIS — R627 Adult failure to thrive: Secondary | ICD-10-CM | POA: Diagnosis not present

## 2021-09-08 DIAGNOSIS — I272 Pulmonary hypertension, unspecified: Secondary | ICD-10-CM | POA: Diagnosis not present

## 2021-09-08 DIAGNOSIS — I130 Hypertensive heart and chronic kidney disease with heart failure and stage 1 through stage 4 chronic kidney disease, or unspecified chronic kidney disease: Secondary | ICD-10-CM | POA: Diagnosis not present

## 2021-09-08 DIAGNOSIS — Z8673 Personal history of transient ischemic attack (TIA), and cerebral infarction without residual deficits: Secondary | ICD-10-CM | POA: Diagnosis not present

## 2021-09-08 DIAGNOSIS — E1122 Type 2 diabetes mellitus with diabetic chronic kidney disease: Secondary | ICD-10-CM | POA: Diagnosis not present

## 2021-09-08 DIAGNOSIS — N189 Chronic kidney disease, unspecified: Secondary | ICD-10-CM | POA: Diagnosis not present

## 2021-09-08 DIAGNOSIS — D631 Anemia in chronic kidney disease: Secondary | ICD-10-CM | POA: Diagnosis not present

## 2021-09-08 DIAGNOSIS — Z87891 Personal history of nicotine dependence: Secondary | ICD-10-CM | POA: Diagnosis not present

## 2021-09-08 DIAGNOSIS — Z9981 Dependence on supplemental oxygen: Secondary | ICD-10-CM | POA: Diagnosis not present

## 2021-09-08 DIAGNOSIS — I5032 Chronic diastolic (congestive) heart failure: Secondary | ICD-10-CM | POA: Diagnosis not present

## 2021-09-08 DIAGNOSIS — E039 Hypothyroidism, unspecified: Secondary | ICD-10-CM | POA: Diagnosis not present

## 2021-09-08 DIAGNOSIS — J449 Chronic obstructive pulmonary disease, unspecified: Secondary | ICD-10-CM | POA: Diagnosis not present

## 2021-09-10 DIAGNOSIS — E1122 Type 2 diabetes mellitus with diabetic chronic kidney disease: Secondary | ICD-10-CM | POA: Diagnosis not present

## 2021-09-10 DIAGNOSIS — Z9181 History of falling: Secondary | ICD-10-CM | POA: Diagnosis not present

## 2021-09-10 DIAGNOSIS — R627 Adult failure to thrive: Secondary | ICD-10-CM | POA: Diagnosis not present

## 2021-09-10 DIAGNOSIS — I272 Pulmonary hypertension, unspecified: Secondary | ICD-10-CM | POA: Diagnosis not present

## 2021-09-10 DIAGNOSIS — Z9981 Dependence on supplemental oxygen: Secondary | ICD-10-CM | POA: Diagnosis not present

## 2021-09-10 DIAGNOSIS — Z87891 Personal history of nicotine dependence: Secondary | ICD-10-CM | POA: Diagnosis not present

## 2021-09-10 DIAGNOSIS — N189 Chronic kidney disease, unspecified: Secondary | ICD-10-CM | POA: Diagnosis not present

## 2021-09-10 DIAGNOSIS — E039 Hypothyroidism, unspecified: Secondary | ICD-10-CM | POA: Diagnosis not present

## 2021-09-10 DIAGNOSIS — I130 Hypertensive heart and chronic kidney disease with heart failure and stage 1 through stage 4 chronic kidney disease, or unspecified chronic kidney disease: Secondary | ICD-10-CM | POA: Diagnosis not present

## 2021-09-10 DIAGNOSIS — J449 Chronic obstructive pulmonary disease, unspecified: Secondary | ICD-10-CM | POA: Diagnosis not present

## 2021-09-10 DIAGNOSIS — Z8673 Personal history of transient ischemic attack (TIA), and cerebral infarction without residual deficits: Secondary | ICD-10-CM | POA: Diagnosis not present

## 2021-09-10 DIAGNOSIS — I5032 Chronic diastolic (congestive) heart failure: Secondary | ICD-10-CM | POA: Diagnosis not present

## 2021-09-10 DIAGNOSIS — D631 Anemia in chronic kidney disease: Secondary | ICD-10-CM | POA: Diagnosis not present

## 2021-09-15 DIAGNOSIS — I130 Hypertensive heart and chronic kidney disease with heart failure and stage 1 through stage 4 chronic kidney disease, or unspecified chronic kidney disease: Secondary | ICD-10-CM | POA: Diagnosis not present

## 2021-09-15 DIAGNOSIS — D631 Anemia in chronic kidney disease: Secondary | ICD-10-CM | POA: Diagnosis not present

## 2021-09-15 DIAGNOSIS — Z87891 Personal history of nicotine dependence: Secondary | ICD-10-CM | POA: Diagnosis not present

## 2021-09-15 DIAGNOSIS — Z9181 History of falling: Secondary | ICD-10-CM | POA: Diagnosis not present

## 2021-09-15 DIAGNOSIS — I5032 Chronic diastolic (congestive) heart failure: Secondary | ICD-10-CM | POA: Diagnosis not present

## 2021-09-15 DIAGNOSIS — Z8673 Personal history of transient ischemic attack (TIA), and cerebral infarction without residual deficits: Secondary | ICD-10-CM | POA: Diagnosis not present

## 2021-09-15 DIAGNOSIS — I272 Pulmonary hypertension, unspecified: Secondary | ICD-10-CM | POA: Diagnosis not present

## 2021-09-15 DIAGNOSIS — N189 Chronic kidney disease, unspecified: Secondary | ICD-10-CM | POA: Diagnosis not present

## 2021-09-15 DIAGNOSIS — J449 Chronic obstructive pulmonary disease, unspecified: Secondary | ICD-10-CM | POA: Diagnosis not present

## 2021-09-15 DIAGNOSIS — E1122 Type 2 diabetes mellitus with diabetic chronic kidney disease: Secondary | ICD-10-CM | POA: Diagnosis not present

## 2021-09-15 DIAGNOSIS — Z9981 Dependence on supplemental oxygen: Secondary | ICD-10-CM | POA: Diagnosis not present

## 2021-09-15 DIAGNOSIS — R627 Adult failure to thrive: Secondary | ICD-10-CM | POA: Diagnosis not present

## 2021-09-15 DIAGNOSIS — E039 Hypothyroidism, unspecified: Secondary | ICD-10-CM | POA: Diagnosis not present

## 2021-09-16 ENCOUNTER — Telehealth: Payer: Self-pay | Admitting: Internal Medicine

## 2021-09-16 DIAGNOSIS — E039 Hypothyroidism, unspecified: Secondary | ICD-10-CM | POA: Diagnosis not present

## 2021-09-16 DIAGNOSIS — Z9981 Dependence on supplemental oxygen: Secondary | ICD-10-CM | POA: Diagnosis not present

## 2021-09-16 DIAGNOSIS — N189 Chronic kidney disease, unspecified: Secondary | ICD-10-CM | POA: Diagnosis not present

## 2021-09-16 DIAGNOSIS — R627 Adult failure to thrive: Secondary | ICD-10-CM | POA: Diagnosis not present

## 2021-09-16 DIAGNOSIS — Z9181 History of falling: Secondary | ICD-10-CM | POA: Diagnosis not present

## 2021-09-16 DIAGNOSIS — J449 Chronic obstructive pulmonary disease, unspecified: Secondary | ICD-10-CM | POA: Diagnosis not present

## 2021-09-16 DIAGNOSIS — Z8673 Personal history of transient ischemic attack (TIA), and cerebral infarction without residual deficits: Secondary | ICD-10-CM | POA: Diagnosis not present

## 2021-09-16 DIAGNOSIS — I272 Pulmonary hypertension, unspecified: Secondary | ICD-10-CM | POA: Diagnosis not present

## 2021-09-16 DIAGNOSIS — E1122 Type 2 diabetes mellitus with diabetic chronic kidney disease: Secondary | ICD-10-CM | POA: Diagnosis not present

## 2021-09-16 DIAGNOSIS — Z87891 Personal history of nicotine dependence: Secondary | ICD-10-CM | POA: Diagnosis not present

## 2021-09-16 DIAGNOSIS — I5032 Chronic diastolic (congestive) heart failure: Secondary | ICD-10-CM | POA: Diagnosis not present

## 2021-09-16 DIAGNOSIS — D631 Anemia in chronic kidney disease: Secondary | ICD-10-CM | POA: Diagnosis not present

## 2021-09-16 DIAGNOSIS — I130 Hypertensive heart and chronic kidney disease with heart failure and stage 1 through stage 4 chronic kidney disease, or unspecified chronic kidney disease: Secondary | ICD-10-CM | POA: Diagnosis not present

## 2021-09-16 NOTE — Telephone Encounter (Signed)
New Message:  Daughter called and said patient heart rate have been slow. Primary doctor her to reach out to Dr Harl Bowie about this. Daughter - wonder if the Metoprolol may be causing  the low heart rate.  Pt c/o medication issue:  1. Name of Medication: Metoprolol 25 mg  2. How are you currently taking this medication (dosage and times per day)? 1/2 tablet a day  3. Are you having a reaction (difficulty breathing--STAT)?   4. What is your medication issue? low heart rate      STAT if HR is under 50 or over 120 (normal HR is 60-100 beats per minute)  What is your heart rate?  Friday(09-11-21)- 67, Saturday(09-12-21)- 57 Sunday(09-13-21)- 66 Tuesday(09-15-21)- 71 Wednesday(09-16-21)- 58    Do you have a log of your heart rate readings (document readings)? Yes  Do you have any other symptoms?  Blood pressure have been high- little swelling in her face  heart rate to be low

## 2021-09-16 NOTE — Telephone Encounter (Signed)
Yes metoprolol lowers HR. She is on a very low dose, her reported HR readings are normal, just on the low end of the normal range. If she's asymptomatic, should be ok to continue. Looks like plan is for palliative services though so not sure how strongly Dr Harl Bowie feels about continuing beta blocker for her diastolic HF.

## 2021-09-17 ENCOUNTER — Other Ambulatory Visit: Payer: Self-pay | Admitting: Neurology

## 2021-09-17 ENCOUNTER — Other Ambulatory Visit: Payer: Self-pay | Admitting: Internal Medicine

## 2021-09-17 DIAGNOSIS — I10 Essential (primary) hypertension: Secondary | ICD-10-CM

## 2021-09-17 MED ORDER — METOPROLOL SUCCINATE ER 25 MG PO TB24: 25.0000 mg | ORAL_TABLET | Freq: Every day | ORAL | 2 refills | Status: DC

## 2021-09-17 NOTE — Telephone Encounter (Signed)
Pt daughter informed of providers result & recommendations. Pt daughter verbalized understanding. No further questions . She states that palliative care came just the one time, at the beginning of this month and at that time she told daughter that her next and final visit would be over the phone and about mid-January. She will continue to take and log BP/HR and will call if this medication change is not working.

## 2021-09-17 NOTE — Progress Notes (Signed)
Increased her metop. With renal function tenuous, limits some options. Since we discussed more palliative, do not need strict BP goal.

## 2021-09-17 NOTE — Telephone Encounter (Signed)
Returned call to daughter she states that she is unsure if these BP/HR. She will continue to take BP and take it after taking her medications. Friday(09-11-21)- 167/59 HR 67, Saturday(09-12-21)- 167/73 57 later 155/67  Sunday(09-13-21)- 170/71 66 Tuesday(09-15-21)- 170/58 71 Wednesday(09-16-21)- 161/79 57  152/68 58 later in the evening 178/58 59.   She will continue to take her weight as well to access her swelling, she is currently stable between 80-90's #'s. Daughter is aware that Dr Harl Bowie is over in the hospital the rest of the week.

## 2021-09-22 DIAGNOSIS — Z9181 History of falling: Secondary | ICD-10-CM | POA: Diagnosis not present

## 2021-09-22 DIAGNOSIS — Z8673 Personal history of transient ischemic attack (TIA), and cerebral infarction without residual deficits: Secondary | ICD-10-CM | POA: Diagnosis not present

## 2021-09-22 DIAGNOSIS — J449 Chronic obstructive pulmonary disease, unspecified: Secondary | ICD-10-CM | POA: Diagnosis not present

## 2021-09-22 DIAGNOSIS — D631 Anemia in chronic kidney disease: Secondary | ICD-10-CM | POA: Diagnosis not present

## 2021-09-22 DIAGNOSIS — E039 Hypothyroidism, unspecified: Secondary | ICD-10-CM | POA: Diagnosis not present

## 2021-09-22 DIAGNOSIS — I272 Pulmonary hypertension, unspecified: Secondary | ICD-10-CM | POA: Diagnosis not present

## 2021-09-22 DIAGNOSIS — N189 Chronic kidney disease, unspecified: Secondary | ICD-10-CM | POA: Diagnosis not present

## 2021-09-22 DIAGNOSIS — Z9981 Dependence on supplemental oxygen: Secondary | ICD-10-CM | POA: Diagnosis not present

## 2021-09-22 DIAGNOSIS — E1122 Type 2 diabetes mellitus with diabetic chronic kidney disease: Secondary | ICD-10-CM | POA: Diagnosis not present

## 2021-09-22 DIAGNOSIS — I5032 Chronic diastolic (congestive) heart failure: Secondary | ICD-10-CM | POA: Diagnosis not present

## 2021-09-22 DIAGNOSIS — Z87891 Personal history of nicotine dependence: Secondary | ICD-10-CM | POA: Diagnosis not present

## 2021-09-22 DIAGNOSIS — R627 Adult failure to thrive: Secondary | ICD-10-CM | POA: Diagnosis not present

## 2021-09-22 DIAGNOSIS — I130 Hypertensive heart and chronic kidney disease with heart failure and stage 1 through stage 4 chronic kidney disease, or unspecified chronic kidney disease: Secondary | ICD-10-CM | POA: Diagnosis not present

## 2021-09-23 DIAGNOSIS — I272 Pulmonary hypertension, unspecified: Secondary | ICD-10-CM | POA: Diagnosis not present

## 2021-09-23 DIAGNOSIS — Z8673 Personal history of transient ischemic attack (TIA), and cerebral infarction without residual deficits: Secondary | ICD-10-CM | POA: Diagnosis not present

## 2021-09-23 DIAGNOSIS — E039 Hypothyroidism, unspecified: Secondary | ICD-10-CM | POA: Diagnosis not present

## 2021-09-23 DIAGNOSIS — I5032 Chronic diastolic (congestive) heart failure: Secondary | ICD-10-CM | POA: Diagnosis not present

## 2021-09-23 DIAGNOSIS — D631 Anemia in chronic kidney disease: Secondary | ICD-10-CM | POA: Diagnosis not present

## 2021-09-23 DIAGNOSIS — R627 Adult failure to thrive: Secondary | ICD-10-CM | POA: Diagnosis not present

## 2021-09-23 DIAGNOSIS — Z87891 Personal history of nicotine dependence: Secondary | ICD-10-CM | POA: Diagnosis not present

## 2021-09-23 DIAGNOSIS — Z9981 Dependence on supplemental oxygen: Secondary | ICD-10-CM | POA: Diagnosis not present

## 2021-09-23 DIAGNOSIS — I130 Hypertensive heart and chronic kidney disease with heart failure and stage 1 through stage 4 chronic kidney disease, or unspecified chronic kidney disease: Secondary | ICD-10-CM | POA: Diagnosis not present

## 2021-09-23 DIAGNOSIS — J449 Chronic obstructive pulmonary disease, unspecified: Secondary | ICD-10-CM | POA: Diagnosis not present

## 2021-09-23 DIAGNOSIS — N189 Chronic kidney disease, unspecified: Secondary | ICD-10-CM | POA: Diagnosis not present

## 2021-09-23 DIAGNOSIS — E1122 Type 2 diabetes mellitus with diabetic chronic kidney disease: Secondary | ICD-10-CM | POA: Diagnosis not present

## 2021-09-23 DIAGNOSIS — Z9181 History of falling: Secondary | ICD-10-CM | POA: Diagnosis not present

## 2021-09-25 ENCOUNTER — Other Ambulatory Visit: Payer: Self-pay | Admitting: Nurse Practitioner

## 2021-09-25 DIAGNOSIS — R630 Anorexia: Secondary | ICD-10-CM

## 2021-09-27 DIAGNOSIS — E43 Unspecified severe protein-calorie malnutrition: Secondary | ICD-10-CM | POA: Diagnosis not present

## 2021-09-29 ENCOUNTER — Other Ambulatory Visit: Payer: Medicare Other | Admitting: Hospice

## 2021-09-29 ENCOUNTER — Other Ambulatory Visit: Payer: Self-pay

## 2021-09-29 DIAGNOSIS — F0390 Unspecified dementia without behavioral disturbance: Secondary | ICD-10-CM

## 2021-09-29 DIAGNOSIS — J449 Chronic obstructive pulmonary disease, unspecified: Secondary | ICD-10-CM

## 2021-09-29 DIAGNOSIS — E43 Unspecified severe protein-calorie malnutrition: Secondary | ICD-10-CM

## 2021-09-29 DIAGNOSIS — Z515 Encounter for palliative care: Secondary | ICD-10-CM | POA: Diagnosis not present

## 2021-09-29 DIAGNOSIS — K5901 Slow transit constipation: Secondary | ICD-10-CM | POA: Diagnosis not present

## 2021-09-29 NOTE — Progress Notes (Signed)
[facility] Consult Note Telephone: [phone]  Fax: [phone]  PATIENT NAME: Leslie Reeves [place] [id number] [phone] (home)  DOB: 03-26-1941 MRN: [id number]  PRIMARY CARE PROVIDER:    [name], [place],  [address] [place] [address] [phone]  REFERRING PROVIDER:   [name], [place] [place],  [place] [address] [phone]  RESPONSIBLE PARTY:   Alla Feeling Information     Name Relation Home Work [place] Daughter [phone]  [phone]        I met face to face with patient and family at home. Palliative Care was asked to follow this patient by consultation request of  [name], FNP to address advance care planning, complex medical decision making and goals of care clarification. This is a follow up visit. Leslie Reeves is present with patient during visit.     ASSESSMENT AND / RECOMMENDATIONS:   CODE STATUS:Discussion on code status. Patient Leslie Reeves elect Partial code; patient wishes to have chest compressions with ACLS medications but no intubation and mechanical ventilation.  Goals of Care: Goals include to maximize quality of life and symptom management  Symptom Management/Plan: Protein Caloric Malnutrition: Weight improving from 86 Ibs  2 months ago to current 91 Ibs, Albumin still low but trending up from 2.4 in Oct 2022 to 3.3 in Nov 2022. Encourage small meals 4-6 times a day. Continue Glucerna. Routine CBC CMP. Dementia: Memory loss and mild confusion in line with Dementia disease trajectory. FAST 6D. Encourage participation in activities, word search, word puzzle, reminiscence. Fall precautions discussed. Continue ongoing supportive care.  COPD: Managed  with Wixela, Albuterol, Oxygen. She has not used Albuterol in a month. Slow deep breathing, avoidance of irritant/triggers discussed. Pulmonologist consult as  needed. CKD 4: Followed by Nephrologist. Education on limiting nephrotoxic substances, getting adequate hydration of 1200cc and reduced salt. Fu with Nephrologist scheduled for 10/15/2021. HTN: Managed with Amlodipine 10 mg daily, Metoprolol 25 mg daily . Hold Metoprolol if HR is 60 or less.  Cardiologist is consult also recommended. Weakness: PT is ongoing for strengthening and gait training.  Continue Mirtazapine as ordered. Monitor weight at home, report decline or progress.  Constipation: OTC Miralax 17 gram mixed in 4-6 ounces of fluid daily, back off with diarrhea.  Follow up: Palliative care will continue to follow for complex medical decision making, advance care planning, and clarification of goals. Return 6 weeks or prn.Encouraged to call provider sooner with any concerns.   Family /Caregiver/Community Supports: Patient lives at home with Leslie Reeves and her family.  HOSPICE ELIGIBILITY/DIAGNOSIS: TBD  Chief Complaint: Follow up visit  HISTORY OF PRESENT ILLNESS:  Leslie Reeves is a 81 y.o. year old female  with multiple medical conditions Protein caloric malnutrition which is improving in terms of improved appetite, weight gain and Albumin level trending up. Patient has Dementia with associated memory loss and confusion, progressive in line with Dementia disease trajectory. History of COPD, CKD 4,  previous CVA, CHF, HTN. Patient denies pain/discomfort, palpitations/chest pain; she is on oxygen with no respiratory distress. Leslie Reeves affirms patient is doing well; no concerns today.   History obtained from review of EMR, discussion with primary team, caregiver, family and/or Leslie Reeves.  Review and summarization of Epic records shows history from other than patient. Rest of 10 point ROS asked and negative.  I reviewed, as needed, available labs, patient records, imaging, studies and related documents from the  EMR.   ROS General: NAD EYES: denies vision changes ENMT: denies  dysphagia Cardiovascular: denies chest pain/discomfort Pulmonary: denies cough, endorses occasional SOB on exertion Abdomen: endorses good appetite, denies constipation/diarrhea GU: denies dysuria, urinary frequency MSK:  endorses weakness,  no falls reported Skin: denies rashes or wounds Neurological: denies pain, denies insomnia Psych: Endorses positive mood Heme/lymph/immuno: denies bruises, abnormal bleeding  Physical Exam: Height/Weight: 5 feet 2 inches/ 91 Ibs up from 84 Ibs 2 months ago.  BP 135/55,  R 18 O2 97% on 3L/Min P 70. Constitutional: NAD General: Well groomed, cooperative, in no distress EYES: anicteric sclera, lids intact, no discharge  ENMT: Moist mucous membrane CV: S1 S2, RRR, no LE edema Pulmonary: LCTA, no increased work of breathing, no cough, Abdomen: active BS + 4 quadrants, soft and non tender GU: no suprapubic tenderness MSK: weakness, sarcopenia, limited ROM, Skin: warm and dry, no rashes or wounds on visible skin Neuro:  weakness, otherwise non focal Psych: non-anxious affect Hem/lymph/immuno: no widespread bruising   PAST MEDICAL HISTORY:  Active Ambulatory Problems    Diagnosis Date Noted   Right thyroid nodule 10/09/2018   Decreased appetite 02/05/2019   Prediabetes 02/05/2019   Essential hypertension 02/05/2019   Solitary pulmonary nodule 05/05/2020   Mild intermittent asthma 05/05/2020   DM2 (diabetes mellitus, type 2) ([facility]) 06/12/2021   AKI (acute kidney injury) ([facility]) 06/12/2021   New onset of congestive heart failure ([facility]) 06/12/2021   Chronic kidney disease (CKD) stage G3a/A1, moderately decreased glomerular filtration rate (GFR) between 45-59 mL/min/1.73 square meter and albuminuria creatinine ratio less than 30 mg/g (HCC) 06/13/2021   Acute on chronic diastolic CHF (congestive heart failure), NYHA class 3 ([facility]) 07/15/2021   Hypothyroidism 07/16/2021   Type 2 diabetes mellitus with hyperlipidemia ([facility]) 07/16/2021   Failure to thrive  in adult 07/16/2021   COPD (chronic obstructive pulmonary disease) ([facility]) 07/16/2021   Protein-calorie malnutrition, severe 07/17/2021   Severe anemia 07/17/2021   Resolved Ambulatory Problems    Diagnosis Date Noted   No Resolved Ambulatory Problems   Past Medical History:  Diagnosis Date   Allergy    Diabetes mellitus without complication ([facility])    Hypertension     SOCIAL HX:  Social History   Tobacco Use   Smoking status: Former    Packs/day: 0.50    Years: 50.00    Pack years: 25.00    Types: Cigarettes    Quit date: 2017    Years since quitting: 6.0   Smokeless tobacco: Never  Substance Use Topics   Alcohol use: Not Currently     FAMILY HX:  Family History  Problem Relation Age of Onset   Heart disease Other    Stroke Other    Hypertension Other    COPD Other    Cancer Other    Asthma Other    Breast cancer Neg Hx       ALLERGIES:  Allergies  Allergen Reactions   Penicillin G Rash   Quinapril Other (See Comments)   Penicillins Rash      PERTINENT MEDICATIONS:  Outpatient Encounter Medications as of 09/29/2021  Medication Sig   Accu-Chek Softclix Lancets lancets Use as instructed   acetaminophen (TYLENOL) 325 MG tablet Take 2 tablets (650 mg total) by mouth every 6 (six) hours as needed for headache or mild pain.   albuterol (VENTOLIN HFA) 108 (90 Base) MCG/ACT inhaler INHALE 2 PUFFS BY MOUTH 4 TIMES A DAY (Patient taking differently: Inhale 2 puffs into the lungs  every 6 (six) hours as needed for shortness of breath or wheezing.)   amLODipine (NORVASC) 10 MG tablet Take 1 tablet (10 mg total) by mouth daily.   blood glucose meter kit and supplies KIT Dispense based on patient and insurance preference. Use up to four times daily as directed. (FOR ICD-9 250.00, 250.01).   diclofenac sodium (VOLTAREN) 1 % GEL Apply 2 g topically 4 (four) times daily.   fluticasone-salmeterol (WIXELA INHUB) 100-50 MCG/ACT AEPB TAKE 1 PUFF BY MOUTH EVERY 12 HOURS IN THE  MORNING AND IN THE EVENING (Patient taking differently: Inhale 1 puff into the lungs 2 (two) times daily.)   glucose blood (ACCU-CHEK AVIVA PLUS) test strip Use as instructed   isosorbide-hydrALAZINE (BIDIL) 20-37.5 MG tablet Take 1 tablet by mouth 3 (three) times daily.   levothyroxine (SYNTHROID) 75 MCG tablet Take 1 tablet (75 mcg total) by mouth daily.   megestrol (MEGACE) 40 MG/ML suspension TAKE 9.1 MLS (625 MG TOTAL) BY MOUTH DAILY. (Patient not taking: Reported on 09/02/2021)   memantine (NAMENDA) 10 MG tablet TAKE 1 TABLET BY MOUTH EVERY DAY   metoprolol succinate (TOPROL-XL) 25 MG 24 hr tablet Take 1 tablet (25 mg total) by mouth daily.   mirtazapine (REMERON) 30 MG tablet TAKE 1 TABLET BY MOUTH AT BEDTIME.   rosuvastatin (CRESTOR) 20 MG tablet TAKE 1 TABLET BY MOUTH EVERY DAY   No facility-administered encounter medications on file as of 09/29/2021.   Thank you for the opportunity to participate in the care of Leslie Reeves.  The palliative care team will continue to follow. Please call our office at [phone] if we can be of additional assistance.   Note: Portions of this note were generated with [employer]. Dictation errors may occur despite best attempts at proofreading.  Teodoro Spray, NP

## 2021-10-01 DIAGNOSIS — E43 Unspecified severe protein-calorie malnutrition: Secondary | ICD-10-CM | POA: Diagnosis not present

## 2021-10-01 DIAGNOSIS — E039 Hypothyroidism, unspecified: Secondary | ICD-10-CM | POA: Diagnosis not present

## 2021-10-01 DIAGNOSIS — J449 Chronic obstructive pulmonary disease, unspecified: Secondary | ICD-10-CM | POA: Diagnosis not present

## 2021-10-01 DIAGNOSIS — Z9181 History of falling: Secondary | ICD-10-CM | POA: Diagnosis not present

## 2021-10-01 DIAGNOSIS — I5033 Acute on chronic diastolic (congestive) heart failure: Secondary | ICD-10-CM | POA: Diagnosis not present

## 2021-10-01 DIAGNOSIS — Z9981 Dependence on supplemental oxygen: Secondary | ICD-10-CM | POA: Diagnosis not present

## 2021-10-01 DIAGNOSIS — R627 Adult failure to thrive: Secondary | ICD-10-CM | POA: Diagnosis not present

## 2021-10-01 DIAGNOSIS — E1122 Type 2 diabetes mellitus with diabetic chronic kidney disease: Secondary | ICD-10-CM | POA: Diagnosis not present

## 2021-10-01 DIAGNOSIS — I130 Hypertensive heart and chronic kidney disease with heart failure and stage 1 through stage 4 chronic kidney disease, or unspecified chronic kidney disease: Secondary | ICD-10-CM | POA: Diagnosis not present

## 2021-10-01 DIAGNOSIS — I272 Pulmonary hypertension, unspecified: Secondary | ICD-10-CM | POA: Diagnosis not present

## 2021-10-01 DIAGNOSIS — N184 Chronic kidney disease, stage 4 (severe): Secondary | ICD-10-CM | POA: Diagnosis not present

## 2021-10-01 DIAGNOSIS — Z681 Body mass index (BMI) 19 or less, adult: Secondary | ICD-10-CM | POA: Diagnosis not present

## 2021-10-01 DIAGNOSIS — D631 Anemia in chronic kidney disease: Secondary | ICD-10-CM | POA: Diagnosis not present

## 2021-10-01 DIAGNOSIS — Z8673 Personal history of transient ischemic attack (TIA), and cerebral infarction without residual deficits: Secondary | ICD-10-CM | POA: Diagnosis not present

## 2021-10-01 DIAGNOSIS — Z87891 Personal history of nicotine dependence: Secondary | ICD-10-CM | POA: Diagnosis not present

## 2021-10-02 ENCOUNTER — Telehealth: Payer: Medicare Other

## 2021-10-02 ENCOUNTER — Ambulatory Visit (INDEPENDENT_AMBULATORY_CARE_PROVIDER_SITE_OTHER): Payer: Commercial Managed Care - HMO

## 2021-10-02 DIAGNOSIS — E538 Deficiency of other specified B group vitamins: Secondary | ICD-10-CM

## 2021-10-02 DIAGNOSIS — N1832 Chronic kidney disease, stage 3b: Secondary | ICD-10-CM

## 2021-10-02 DIAGNOSIS — R634 Abnormal weight loss: Secondary | ICD-10-CM

## 2021-10-02 DIAGNOSIS — I5031 Acute diastolic (congestive) heart failure: Secondary | ICD-10-CM

## 2021-10-02 DIAGNOSIS — I10 Essential (primary) hypertension: Secondary | ICD-10-CM

## 2021-10-02 DIAGNOSIS — R630 Anorexia: Secondary | ICD-10-CM

## 2021-10-02 DIAGNOSIS — E46 Unspecified protein-calorie malnutrition: Secondary | ICD-10-CM

## 2021-10-02 DIAGNOSIS — E039 Hypothyroidism, unspecified: Secondary | ICD-10-CM

## 2021-10-02 NOTE — Chronic Care Management (AMB) (Signed)
Chronic Care Management   CCM RN Visit Note  10/02/2021 Name: Leslie Reeves MRN: [id number] DOB: 21-Dec-1940  Subjective: Leslie Reeves is a 81 y.o. year old female who is a primary care patient of [name], [place]. The care management team was consulted for assistance with disease management and care coordination needs.    Engaged with patient by telephone for follow up visit in response to provider referral for case management and/or care coordination services.   Consent to Services:  The patient was given information about Chronic Care Management services, agreed to services, and gave verbal consent prior to initiation of services.  Please see initial visit note for detailed documentation.   Patient agreed to services and verbal consent obtained.   Assessment: Review of patient past medical history, allergies, medications, health status, including review of consultants reports, laboratory and other test data, was performed as part of comprehensive evaluation and provision of chronic care management services.   SDOH (Social Determinants of Health) assessments and interventions performed:  yes, no acute changes  CCM Care Plan  Allergies  Allergen Reactions   Penicillin G Rash   Quinapril Other (See Comments)   Penicillins Rash    Outpatient Encounter Medications as of 10/02/2021  Medication Sig   Accu-Chek Softclix Lancets lancets Use as instructed   acetaminophen (TYLENOL) 325 MG tablet Take 2 tablets (650 mg total) by mouth every 6 (six) hours as needed for headache or mild pain.   albuterol (VENTOLIN HFA) 108 (90 Base) MCG/ACT inhaler INHALE 2 PUFFS BY MOUTH 4 TIMES A DAY (Patient taking differently: Inhale 2 puffs into the lungs every 6 (six) hours as needed for shortness of breath or wheezing.)   amLODipine (NORVASC) 10 MG tablet Take 1 tablet (10 mg total) by mouth daily.   blood glucose meter kit and supplies KIT Dispense based on patient and insurance preference. Use up to  four times daily as directed. (FOR ICD-9 250.00, 250.01).   diclofenac sodium (VOLTAREN) 1 % GEL Apply 2 g topically 4 (four) times daily.   fluticasone-salmeterol (WIXELA INHUB) 100-50 MCG/ACT AEPB TAKE 1 PUFF BY MOUTH EVERY 12 HOURS IN THE MORNING AND IN THE EVENING (Patient taking differently: Inhale 1 puff into the lungs 2 (two) times daily.)   glucose blood (ACCU-CHEK AVIVA PLUS) test strip Use as instructed   isosorbide-hydrALAZINE (BIDIL) 20-37.5 MG tablet Take 1 tablet by mouth 3 (three) times daily.   levothyroxine (SYNTHROID) 75 MCG tablet Take 1 tablet (75 mcg total) by mouth daily.   megestrol (MEGACE) 40 MG/ML suspension TAKE 9.1 MLS (625 MG TOTAL) BY MOUTH DAILY. (Patient not taking: Reported on 09/02/2021)   memantine (NAMENDA) 10 MG tablet TAKE 1 TABLET BY MOUTH EVERY DAY   metoprolol succinate (TOPROL-XL) 25 MG 24 hr tablet Take 1 tablet (25 mg total) by mouth daily.   mirtazapine (REMERON) 30 MG tablet TAKE 1 TABLET BY MOUTH AT BEDTIME.   rosuvastatin (CRESTOR) 20 MG tablet TAKE 1 TABLET BY MOUTH EVERY DAY   No facility-administered encounter medications on file as of 10/02/2021.    Patient Active Problem List   Diagnosis Date Noted   Protein-calorie malnutrition, severe 07/17/2021   Severe anemia 07/17/2021   Hypothyroidism 07/16/2021   Type 2 diabetes mellitus with hyperlipidemia ([facility]) 07/16/2021   Failure to thrive in adult 07/16/2021   COPD (chronic obstructive pulmonary disease) ([facility]) 07/16/2021   Acute on chronic diastolic CHF (congestive heart failure), NYHA class 3 ([facility]) 07/15/2021   Chronic kidney disease (  CKD) stage G3a/A1, moderately decreased glomerular filtration rate (GFR) between 45-59 mL/min/1.73 square meter and albuminuria creatinine ratio less than 30 mg/g (HCC) 06/13/2021   DM2 (diabetes mellitus, type 2) ([facility]) 06/12/2021   AKI (acute kidney injury) ([facility]) 06/12/2021   New onset of congestive heart failure ([facility]) 06/12/2021   Solitary pulmonary nodule  05/05/2020   Mild intermittent asthma 05/05/2020   Decreased appetite 02/05/2019   Prediabetes 02/05/2019   Essential hypertension 02/05/2019   Right thyroid nodule 10/09/2018    Conditions to be addressed/monitored: Essential hypertension, Decreased appetite, Abnormal weight loss, Malnutrition, B12 Deficiency, Stage 3b chronic kidney disease, Acute Diastolic Congestive Heart Failure, Acquired hypothyroidism   Care Plan : RN Care Manager Plan of Care  Updates made by Lynne Logan, RN since 10/02/2021 12:00 AM     Problem: No Plan of Care established for management of chronic disease states (Essential hypertension, Decreased appetite, Abnormal weight loss, Malnutrition, B12 Deficiency, Stage 3b chronic kidney disease, Acute Diastolic CHF, Aquired hypothyroidism)   Priority: High     Long-Range Goal: Establishment of plan of care for management of chronic disease states (Essential hypertension, Decreased appetite, Abnormal weight loss, Malnutrition, B12 Deficiency, Stage 3b chronic kidney disease, Acute Diastolic CHF, Acquired hypothyroidism)   Start Date: 10/02/2021  Expected End Date: 10/01/2022  This Visit's Progress: On track  Priority: High  Note:   Current Barriers:  Knowledge Deficits related to plan of care for management of Essential hypertension, Decreased appetite, Abnormal weight loss, Malnutrition, B12 Deficiency, Stage 3b chronic kidney disease, Acute Diastolic Congestive Heart Failure   Chronic Disease Management support and education needs related to Essential hypertension, Decreased appetite, Abnormal weight loss, Malnutrition, B12 Deficiency, Stage 3b chronic kidney disease, Acute Diastolic Congestive Heart Failure    RNCM Clinical Goal(s):  Patient will verbalize basic understanding of  Essential hypertension, Decreased appetite, Abnormal weight loss, Malnutrition, B12 Deficiency, Stage 3b chronic kidney disease, Acute Diastolic Congestive Heart Failure  disease process and  self health management plan as evidenced by patient will report having no disease exacerbations related to her chronic disease states as listed above take all medications exactly as prescribed and will call provider for medication related questions as evidenced by patient will experience no adverse effects related to her medication regimen  continue to work with RN Care Manager to address care management and care coordination needs related to  Essential hypertension, Decreased appetite, Abnormal weight loss, Malnutrition, B12 Deficiency, Stage 3b chronic kidney disease, Acute Diastolic Congestive Heart Failure  as evidenced by adherence to CM Team Scheduled appointments through collaboration with RN Care manager, provider, and care team.   Interventions: 1:1 collaboration with primary care provider regarding development and update of comprehensive plan of care as evidenced by provider attestation and co-signature Inter-disciplinary care team collaboration (see longitudinal plan of care) Evaluation of current treatment plan related to  self management and patient's adherence to plan as established by provider Completed successful outbound call with daughter [name]    Chronic Kidney Disease Interventions:  (Status:  Goal on track:  Yes.) Long Term Goal Assessed the daughter [name] understanding of chronic kidney disease    Reviewed prescribed diet increase daily water intake as directed by the kidney doctor  Provided education on kidney disease progression    Discussed plans with patient for ongoing care management follow up and provided patient with direct contact information for care management team Last practice recorded BP readings:  BP Readings from Last 3 Encounters:  09/02/21 138/72  08/24/21 [name])  183/65  08/13/21 (!) 150/50  Most recent eGFR/CrCl:  Lab Results  Component Value Date   EGFR 26 (L) 07/29/2021    No components found for: CRCL  Heart Failure Interventions:  (Status:  Goal  on track:  Yes.) Long Term Goal Provided education on low sodium diet Reviewed Heart Failure Action Plan in depth and provided written copy Assessed need for readable accurate scales in home Provided education about placing scale on hard, flat surface Advised patient to weigh each morning after emptying bladder Discussed importance of daily weight and advised patient to weigh and record daily Discussed plans with patient for ongoing care management follow up and provided patient with direct contact information for care management team   Acquired Hypothyroidism Interventions: (Status: New Goal) Evaluation of current treatment plan related to hypothyroidism, self-management and patient's adherence to plan as established by provider Review of patient status, including review of consultant's reports, relevant laboratory and other test results, and medications completed Reviewed medications with patient and discussed importance of medication adherence, confirmed daughter [name] is administering patient's Synthroid first thing in the am 30 minutes before food/drink and or taking other medications  Noted a medication discrepancy related to recent dosage change to patient's Synthroid Collaborated with PCP via in basket message advising of discrepancy and requested additional recommendations for dosing/frequency of Synthroid  Discussed plans with patient for ongoing care management follow up and provided patient with direct contact information for care management team   Nutritional Imbalance Interventions:  (Status:  Goal on track:  Yes.)  Long Term Goal Evaluation of current treatment plan related to  Nutritional Imbalance , self-management and patient's adherence to plan as established by provider Assessed for nutritional imbalance, discussed patient has a poor appetite but daughter is trying to provide meals the patient prefers Assessed for medication adherence, patient is taking Remeron as directed   Educated patient on the importance to monitor patient's weight and to keep the PCP notified of ongoing weight loss and or decreased appetite  Reinforced the importance of keeping patient well hydrated to help maintain renal function and hydration Discussed plans with patient for ongoing care management follow up and provided patient with direct contact information for care management team   Patient Goals/Self-Care Activities: Take all medications as prescribed Attend all scheduled provider appointments Call pharmacy for medication refills 3-7 days in advance of running out of medications Perform all self care activities independently  Perform IADL's (shopping, preparing meals, housekeeping, managing finances) independently Call provider office for new concerns or questions   Follow Up Plan:  Telephone follow up appointment with care management team member scheduled for:  10/06/21      Plan:Telephone follow up appointment with care management team member scheduled for:  10/06/21  Barb Merino, RN, BSN, CCM Care Management Coordinator [facility]  Direct Phone: [phone]

## 2021-10-02 NOTE — Patient Instructions (Signed)
Visit Information  Thank you for taking time to visit with me today. Please don't hesitate to contact me if I can be of assistance to you before our next scheduled telephone appointment.  Following are the goals we discussed today:  (Copy and paste patient goals from clinical care plan here)  Our next appointment is by telephone on 10/06/20 at 4:00 PM  Please call the care guide team at [phone] if you need to cancel or reschedule your appointment.   If you are experiencing a Mental Health or [place] or need someone to talk to, please call 1-800-273-TALK (toll free, 24 hour hotline)   Patient verbalizes understanding of instructions provided today and agrees to view in [place].   Barb Merino, RN, BSN, CCM Care Management Coordinator [facility] [facility]  Direct Phone: [phone]

## 2021-10-06 ENCOUNTER — Other Ambulatory Visit: Payer: Self-pay | Admitting: Nurse Practitioner

## 2021-10-06 ENCOUNTER — Ambulatory Visit: Payer: Medicare Other

## 2021-10-06 ENCOUNTER — Telehealth: Payer: Commercial Managed Care - HMO

## 2021-10-06 ENCOUNTER — Ambulatory Visit: Payer: Self-pay

## 2021-10-06 DIAGNOSIS — D631 Anemia in chronic kidney disease: Secondary | ICD-10-CM | POA: Diagnosis not present

## 2021-10-06 DIAGNOSIS — N184 Chronic kidney disease, stage 4 (severe): Secondary | ICD-10-CM | POA: Diagnosis not present

## 2021-10-06 DIAGNOSIS — I5033 Acute on chronic diastolic (congestive) heart failure: Secondary | ICD-10-CM | POA: Diagnosis not present

## 2021-10-06 DIAGNOSIS — R630 Anorexia: Secondary | ICD-10-CM

## 2021-10-06 DIAGNOSIS — Z9981 Dependence on supplemental oxygen: Secondary | ICD-10-CM | POA: Diagnosis not present

## 2021-10-06 DIAGNOSIS — Z9181 History of falling: Secondary | ICD-10-CM | POA: Diagnosis not present

## 2021-10-06 DIAGNOSIS — I130 Hypertensive heart and chronic kidney disease with heart failure and stage 1 through stage 4 chronic kidney disease, or unspecified chronic kidney disease: Secondary | ICD-10-CM | POA: Diagnosis not present

## 2021-10-06 DIAGNOSIS — E43 Unspecified severe protein-calorie malnutrition: Secondary | ICD-10-CM | POA: Diagnosis not present

## 2021-10-06 DIAGNOSIS — Z681 Body mass index (BMI) 19 or less, adult: Secondary | ICD-10-CM | POA: Diagnosis not present

## 2021-10-06 DIAGNOSIS — E1122 Type 2 diabetes mellitus with diabetic chronic kidney disease: Secondary | ICD-10-CM | POA: Diagnosis not present

## 2021-10-06 DIAGNOSIS — R634 Abnormal weight loss: Secondary | ICD-10-CM

## 2021-10-06 DIAGNOSIS — Z87891 Personal history of nicotine dependence: Secondary | ICD-10-CM | POA: Diagnosis not present

## 2021-10-06 DIAGNOSIS — I10 Essential (primary) hypertension: Secondary | ICD-10-CM

## 2021-10-06 DIAGNOSIS — N1832 Chronic kidney disease, stage 3b: Secondary | ICD-10-CM

## 2021-10-06 DIAGNOSIS — E1169 Type 2 diabetes mellitus with other specified complication: Secondary | ICD-10-CM

## 2021-10-06 DIAGNOSIS — J449 Chronic obstructive pulmonary disease, unspecified: Secondary | ICD-10-CM | POA: Diagnosis not present

## 2021-10-06 DIAGNOSIS — E785 Hyperlipidemia, unspecified: Secondary | ICD-10-CM

## 2021-10-06 DIAGNOSIS — R627 Adult failure to thrive: Secondary | ICD-10-CM | POA: Diagnosis not present

## 2021-10-06 DIAGNOSIS — E538 Deficiency of other specified B group vitamins: Secondary | ICD-10-CM

## 2021-10-06 DIAGNOSIS — E46 Unspecified protein-calorie malnutrition: Secondary | ICD-10-CM

## 2021-10-06 DIAGNOSIS — E039 Hypothyroidism, unspecified: Secondary | ICD-10-CM

## 2021-10-06 DIAGNOSIS — Z8673 Personal history of transient ischemic attack (TIA), and cerebral infarction without residual deficits: Secondary | ICD-10-CM | POA: Diagnosis not present

## 2021-10-06 DIAGNOSIS — I272 Pulmonary hypertension, unspecified: Secondary | ICD-10-CM | POA: Diagnosis not present

## 2021-10-06 DIAGNOSIS — I5031 Acute diastolic (congestive) heart failure: Secondary | ICD-10-CM

## 2021-10-06 MED ORDER — MIRTAZAPINE 15 MG PO TABS: 30.0000 mg | ORAL_TABLET | Freq: Every day | ORAL | 2 refills | Status: DC

## 2021-10-06 MED ORDER — LEVOTHYROXINE SODIUM 100 MCG PO TABS: 100.0000 ug | ORAL_TABLET | Freq: Every day | ORAL | 2 refills | Status: DC

## 2021-10-06 NOTE — Patient Instructions (Signed)
Visit Information  Thank you for taking time to visit with me today. Please don't hesitate to contact me if I can be of assistance to you before our next scheduled telephone appointment.  Following are the goals we discussed today:  (Copy and paste patient goals from clinical care plan here)  Our next appointment is by telephone on 12/21/21 at 12 noon  Please call the care guide team at [phone] if you need to cancel or reschedule your appointment.   If you are experiencing a Mental Health or [place] or need someone to talk to, please call 1-800-273-TALK (toll free, 24 hour hotline)   Patient verbalizes understanding of instructions provided today and agrees to view in [place].    Barb Merino, RN, BSN, CCM Care Management Coordinator [facility] [facility]  Direct Phone: [phone]

## 2021-10-06 NOTE — Chronic Care Management (AMB) (Signed)
Chronic Care Management    Social Work Note  10/06/2021 Name: Leslie Reeves MRN: [id number] DOB: 11-17-1940  Leslie Reeves is a 81 y.o. year old female who is a primary care patient of Leslie Reeves, [place]. The CCM team was consulted to assist the patient with chronic disease management and/or care coordination needs related to:  HTN, Alzheimer's Disease, Abnormal Weight Loss .   Engaged with patients daughter Leslie Reeves by phone  for follow up visit in response to provider referral for social work chronic care management and care coordination services.   Consent to Services:  The patient was given information about Chronic Care Management services, agreed to services, and gave verbal consent prior to initiation of services.  Please see initial visit note for detailed documentation.   Patient agreed to services and consent obtained.   Assessment: Review of patient past medical history, allergies, medications, and health status, including review of relevant consultants reports was performed today as part of a comprehensive evaluation and provision of chronic care management and care coordination services.     SDOH (Social Determinants of Health) assessments and interventions performed:    Advanced Directives Status: Not addressed in this encounter.  CCM Care Plan  Allergies  Allergen Reactions   Penicillin G Rash   Quinapril Other (See Comments)   Penicillins Rash    Outpatient Encounter Medications as of 10/06/2021  Medication Sig   Accu-Chek Softclix Lancets lancets Use as instructed   acetaminophen (TYLENOL) 325 MG tablet Take 2 tablets (650 mg total) by mouth every 6 (six) hours as needed for headache or mild pain.   albuterol (VENTOLIN HFA) 108 (90 Base) MCG/ACT inhaler INHALE 2 PUFFS BY MOUTH 4 TIMES A DAY (Patient taking differently: Inhale 2 puffs into the lungs every 6 (six) hours as needed for shortness of breath or wheezing.)   amLODipine (NORVASC) 10 MG tablet Take 1 tablet  (10 mg total) by mouth daily.   blood glucose meter kit and supplies KIT Dispense based on patient and insurance preference. Use up to four times daily as directed. (FOR ICD-9 250.00, 250.01).   diclofenac sodium (VOLTAREN) 1 % GEL Apply 2 g topically 4 (four) times daily.   fluticasone-salmeterol (WIXELA INHUB) 100-50 MCG/ACT AEPB TAKE 1 PUFF BY MOUTH EVERY 12 HOURS IN THE MORNING AND IN THE EVENING (Patient taking differently: Inhale 1 puff into the lungs 2 (two) times daily.)   glucose blood (ACCU-CHEK AVIVA PLUS) test strip Use as instructed   isosorbide-hydrALAZINE (BIDIL) 20-37.5 MG tablet Take 1 tablet by mouth 3 (three) times daily.   levothyroxine (SYNTHROID) 75 MCG tablet Take 1 tablet (75 mcg total) by mouth daily.   megestrol (MEGACE) 40 MG/ML suspension TAKE 9.1 MLS (625 MG TOTAL) BY MOUTH DAILY. (Patient not taking: Reported on 09/02/2021)   memantine (NAMENDA) 10 MG tablet TAKE 1 TABLET BY MOUTH EVERY DAY   metoprolol succinate (TOPROL-XL) 25 MG 24 hr tablet Take 1 tablet (25 mg total) by mouth daily.   mirtazapine (REMERON) 30 MG tablet TAKE 1 TABLET BY MOUTH AT BEDTIME.   rosuvastatin (CRESTOR) 20 MG tablet TAKE 1 TABLET BY MOUTH EVERY DAY   No facility-administered encounter medications on file as of 10/06/2021.    Patient Active Problem List   Diagnosis Date Noted   Protein-calorie malnutrition, severe 07/17/2021   Severe anemia 07/17/2021   Hypothyroidism 07/16/2021   Type 2 diabetes mellitus with hyperlipidemia ([place]) 07/16/2021   Failure to thrive in adult 07/16/2021   COPD (chronic  obstructive pulmonary disease) ([place]) 07/16/2021   Acute on chronic diastolic CHF (congestive heart failure), NYHA class 3 ([place]) 07/15/2021   Chronic kidney disease (CKD) stage G3a/A1, moderately decreased glomerular filtration rate (GFR) between 45-59 mL/min/1.73 square meter and albuminuria creatinine ratio less than 30 mg/g ([place]) 06/13/2021   DM2 (diabetes mellitus, type 2) ([place]) 06/12/2021    AKI (acute kidney injury) ([place]) 06/12/2021   New onset of congestive heart failure ([place]) 06/12/2021   Solitary pulmonary nodule 05/05/2020   Mild intermittent asthma 05/05/2020   Decreased appetite 02/05/2019   Prediabetes 02/05/2019   Essential hypertension 02/05/2019   Right thyroid nodule 10/09/2018    Conditions to be addressed/monitored: HTN, Dementia, and Abnormal Weight Loss  Care Plan : Social Work Plan of Care  Updates made by [name] since 10/06/2021 12:00 AM  Completed 10/06/2021   Problem: Mobility and Independence Resolved 10/06/2021     Long-Range Goal: Mobility and Independence Optimized Post Discharge Completed 10/06/2021  Start Date: 08/10/2021  Priority: High  Note:   Current Barriers:  Chronic disease management support and education needs related to  HTN, COPD, Acute CHF, AKI   Recent inpatient admission due to Dyspnea Does not currently weigh self daily Social Worker Clinical Goal(s):  patient will work with SW to identify and address any acute and/or chronic care coordination needs related to the self health management of  HTN, COPD, Acute CHF, AKI   Patient will follow cardiology recommendations regarding management of CHF SW Interventions:  Inter-disciplinary care team collaboration (see longitudinal plan of care) Collaboration with Leslie Brine, FNP regarding development and update of comprehensive plan of care as evidenced by provider attestation and co-signature Successful outbound call placed to patients daughter and caregiver Leslie Reeves to assess for care coordination needs  Discussed the patients appetite continues to decrease but patient eats when she is able Performed chart review to note patient continues to be active and engaged with Palliative Care Discussed plans for SW to close goal with option to re-open as needed, Leslie Reeves agreed with plan  Patient Goals/Self-Care Activities patient will: with the help of her daughter  - Engage with  Palliative Care as needed       Follow Up Plan:  No Sw follow up planned at this time. The patient will remain engaged with RN Care Manager.      [name], BSW, CDP Social Worker, Certified Dementia Practitioner [facility] [facility] [phone]

## 2021-10-06 NOTE — Chronic Care Management (AMB) (Signed)
Chronic Care Management   CCM RN Visit Note  10/06/2021 Name: Leslie Reeves MRN: [id number] DOB: [birth year]  Subjective: Leslie Reeves is a 81 y.o. year old female who is a primary care patient of [name], [place]. The care management team was consulted for assistance with disease management and care coordination needs.    Engaged with patient by telephone for follow up visit in response to provider referral for case management and/or care coordination services.   Consent to Services:  The patient was given information about Chronic Care Management services, agreed to services, and gave verbal consent prior to initiation of services.  Please see initial visit note for detailed documentation.   Patient agreed to services and verbal consent obtained.   Assessment: Review of patient past medical history, allergies, medications, health status, including review of consultants reports, laboratory and other test data, was performed as part of comprehensive evaluation and provision of chronic care management services.   SDOH (Social Determinants of Health) assessments and interventions performed:    CCM Care Plan  Allergies  Allergen Reactions   Penicillin G Rash   Quinapril Other (See Comments)   Penicillins Rash    Outpatient Encounter Medications as of 10/06/2021  Medication Sig   Accu-Chek Softclix Lancets lancets Use as instructed   acetaminophen (TYLENOL) 325 MG tablet Take 2 tablets (650 mg total) by mouth every 6 (six) hours as needed for headache or mild pain.   albuterol (VENTOLIN HFA) 108 (90 Base) MCG/ACT inhaler INHALE 2 PUFFS BY MOUTH 4 TIMES A DAY (Patient taking differently: Inhale 2 puffs into the lungs every 6 (six) hours as needed for shortness of breath or wheezing.)   amLODipine (NORVASC) 10 MG tablet Take 1 tablet (10 mg total) by mouth daily.   blood glucose meter kit and supplies KIT Dispense based on patient and insurance preference. Use up to four times daily as  directed. (FOR ICD-9 250.00, 250.01).   diclofenac sodium (VOLTAREN) 1 % GEL Apply 2 g topically 4 (four) times daily.   fluticasone-salmeterol (WIXELA INHUB) 100-50 MCG/ACT AEPB TAKE 1 PUFF BY MOUTH EVERY 12 HOURS IN THE MORNING AND IN THE EVENING (Patient taking differently: Inhale 1 puff into the lungs 2 (two) times daily.)   glucose blood (ACCU-CHEK AVIVA PLUS) test strip Use as instructed   isosorbide-hydrALAZINE (BIDIL) 20-37.5 MG tablet Take 1 tablet by mouth 3 (three) times daily.   levothyroxine (SYNTHROID) 100 MCG tablet Take 1 tablet (100 mcg total) by mouth daily.   megestrol (MEGACE) 40 MG/ML suspension TAKE 9.1 MLS (625 MG TOTAL) BY MOUTH DAILY. (Patient not taking: Reported on 09/02/2021)   memantine (NAMENDA) 10 MG tablet TAKE 1 TABLET BY MOUTH EVERY DAY   metoprolol succinate (TOPROL-XL) 25 MG 24 hr tablet Take 1 tablet (25 mg total) by mouth daily.   mirtazapine (REMERON) 15 MG tablet Take 2 tablets (30 mg total) by mouth at bedtime.   rosuvastatin (CRESTOR) 20 MG tablet TAKE 1 TABLET BY MOUTH EVERY DAY   No facility-administered encounter medications on file as of 10/06/2021.    Patient Active Problem List   Diagnosis Date Noted   Protein-calorie malnutrition, severe 07/17/2021   Severe anemia 07/17/2021   Hypothyroidism 07/16/2021   Type 2 diabetes mellitus with hyperlipidemia ([place]) 07/16/2021   Failure to thrive in adult 07/16/2021   COPD (chronic obstructive pulmonary disease) ([place]) 07/16/2021   Acute on chronic diastolic CHF (congestive heart failure), NYHA class 3 ([place]) 07/15/2021   Chronic kidney disease (  CKD) stage G3a/A1, moderately decreased glomerular filtration rate (GFR) between 45-59 mL/min/1.73 square meter and albuminuria creatinine ratio less than 30 mg/g ([facility]) 06/13/2021   DM2 (diabetes mellitus, type 2) ([place]) 06/12/2021   AKI (acute kidney injury) ([place]) 06/12/2021   New onset of congestive heart failure ([place]) 06/12/2021   Solitary pulmonary nodule  05/05/2020   Mild intermittent asthma 05/05/2020   Decreased appetite 02/05/2019   Prediabetes 02/05/2019   Essential hypertension 02/05/2019   Right thyroid nodule 10/09/2018    Conditions to be addressed/monitored: Essential hypertension, Decreased appetite, Abnormal weight loss, Malnutrition, B12 Deficiency, Stage 3b chronic kidney disease, Acute Diastolic Congestive Heart Failure, Acquired hypothyroidism   Care Plan : RN Care Manager Plan of Care  Updates made by Lynne Logan, RN since 10/06/2021 12:00 AM     Problem: No Plan of Care established for management of chronic disease states (Essential hypertension, Decreased appetite, Abnormal weight loss, Malnutrition, B12 Deficiency, Stage 3b chronic kidney disease, Acute Diastolic CHF, Aquired hypothyroidism)   Priority: High     Long-Range Goal: Establishment of plan of care for management of chronic disease states (Essential hypertension, Decreased appetite, Abnormal weight loss, Malnutrition, B12 Deficiency, Stage 3b chronic kidney disease, Acute Diastolic CHF, Acquired hypothyroidism)   Start Date: 10/02/2021  Expected End Date: 10/01/2022  Recent Progress: On track  Priority: High  Note:   Current Barriers:  Knowledge Deficits related to plan of care for management of Essential hypertension, Decreased appetite, Abnormal weight loss, Malnutrition, B12 Deficiency, Stage 3b chronic kidney disease, Acute Diastolic Congestive Heart Failure   Chronic Disease Management support and education needs related to Essential hypertension, Decreased appetite, Abnormal weight loss, Malnutrition, B12 Deficiency, Stage 3b chronic kidney disease, Acute Diastolic Congestive Heart Failure    RNCM Clinical Goal(s):  Patient will verbalize basic understanding of  Essential hypertension, Decreased appetite, Abnormal weight loss, Malnutrition, B12 Deficiency, Stage 3b chronic kidney disease, Acute Diastolic Congestive Heart Failure  disease process and self  health management plan as evidenced by patient will report having no disease exacerbations related to her chronic disease states as listed above take all medications exactly as prescribed and will call provider for medication related questions as evidenced by patient will experience no adverse effects related to her medication regimen  continue to work with RN Care Manager to address care management and care coordination needs related to  Essential hypertension, Decreased appetite, Abnormal weight loss, Malnutrition, B12 Deficiency, Stage 3b chronic kidney disease, Acute Diastolic Congestive Heart Failure  as evidenced by adherence to CM Team Scheduled appointments through collaboration with RN Care manager, provider, and care team.   Interventions: 1:1 collaboration with primary care provider regarding development and update of comprehensive plan of care as evidenced by provider attestation and co-signature Inter-disciplinary care team collaboration (see longitudinal plan of care) Evaluation of current treatment plan related to  self management and patient's adherence to plan as established by provider Completed successful outbound call with daughter [name]    Chronic Kidney Disease Interventions:  (Status:  Condition stable.  Not addressed this visit.) Long Term Goal Assessed the daughter [name] understanding of chronic kidney disease    Reviewed prescribed diet increase daily water intake as directed by the kidney doctor  Provided education on kidney disease progression    Discussed plans with patient for ongoing care management follow up and provided patient with direct contact information for care management team Last practice recorded BP readings:  BP Readings from Last 3 Encounters:  09/02/21 138/72  08/24/21 (!) 183/65  08/13/21 (!) 150/50  Most recent eGFR/CrCl:  Lab Results  Component Value Date   EGFR 26 (L) 07/29/2021    No components found for: CRCL  Heart Failure  Interventions:  (Status:  Condition stable.  Not addressed this visit.) Long Term Goal Provided education on low sodium diet Reviewed Heart Failure Action Plan in depth and provided written copy Assessed need for readable accurate scales in home Provided education about placing scale on hard, flat surface Advised patient to weigh each morning after emptying bladder Discussed importance of daily weight and advised patient to weigh and record daily Discussed plans with patient for ongoing care management follow up and provided patient with direct contact information for care management team   Acquired Hypothyroidism Interventions: (Status:  Goal on track:  Yes.)  Long Term Goal Evaluation of current treatment plan related to hypothyroidism, self-management and patient's adherence to plan as established by provider Collaboration with PCP provider [name] FNP regarding patients current Synthroid dosage, PCP will send a new Rx for Synthroid 100 mcg qd, she will decrease patient's Remeron to 15 mg qd Placed outbound call to daughter [name] to advise of PCP recommendations for Synthroid and Remeron, daughter verbalizes understanding  Educated daughter about symptoms that may be suggestive of transition to End of Life and encouraged daughter to keep patient's health care team well informed of health related concerns and or status change  Discussed plans with patient for ongoing care management follow up and provided patient with direct contact information for care management team   Nutritional Imbalance Interventions:  (Status:  Condition stable.  Not addressed this visit.)  Long Term Goal Evaluation of current treatment plan related to  Nutritional Imbalance , self-management and patient's adherence to plan as established by provider Assessed for nutritional imbalance, discussed patient has a poor appetite but daughter is trying to provide meals the patient prefers Assessed for medication  adherence, patient is taking Remeron as directed  Educated patient on the importance to monitor patient's weight and to keep the PCP notified of ongoing weight loss and or decreased appetite  Reinforced the importance of keeping patient well hydrated to help maintain renal function and hydration Discussed plans with patient for ongoing care management follow up and provided patient with direct contact information for care management team   Patient Goals/Self-Care Activities: Take all medications as prescribed Attend all scheduled provider appointments Call pharmacy for medication refills 3-7 days in advance of running out of medications Perform all self care activities independently  Perform IADL's (shopping, preparing meals, housekeeping, managing finances) independently Call provider office for new concerns or questions   Follow Up Plan:  Telephone follow up appointment with care management team member scheduled for:  12/21/21      Plan:Telephone follow up appointment with care management team member scheduled for:  12/21/21  Barb Merino, RN, BSN, CCM Care Management Coordinator [facility]  Direct Phone: [phone]

## 2021-10-06 NOTE — Patient Instructions (Signed)
Social Worker Visit Information  Goals we discussed today:   Goals Addressed             This Visit's Progress    COMPLETED: Mobility and Independence Optimized Post Discharge       Timeframe:  Long-Range Goal Priority:  High Start Date:   11.14.22                           Patient Goals/Self-Care Activities patient will: with the help of her daughter  - Engage with Palliative Care as needed         Patient verbalizes understanding of instructions provided today and agrees to view in [place].   Follow Up Plan:  No follow up planned at this time. Please contact me as needed.   [name], BSW, CDP Social Worker, Certified Dementia Practitioner [place] [facility] [phone]

## 2021-10-14 DIAGNOSIS — Z9181 History of falling: Secondary | ICD-10-CM | POA: Diagnosis not present

## 2021-10-14 DIAGNOSIS — E43 Unspecified severe protein-calorie malnutrition: Secondary | ICD-10-CM | POA: Diagnosis not present

## 2021-10-14 DIAGNOSIS — Z681 Body mass index (BMI) 19 or less, adult: Secondary | ICD-10-CM | POA: Diagnosis not present

## 2021-10-14 DIAGNOSIS — E039 Hypothyroidism, unspecified: Secondary | ICD-10-CM | POA: Diagnosis not present

## 2021-10-14 DIAGNOSIS — J449 Chronic obstructive pulmonary disease, unspecified: Secondary | ICD-10-CM | POA: Diagnosis not present

## 2021-10-14 DIAGNOSIS — I130 Hypertensive heart and chronic kidney disease with heart failure and stage 1 through stage 4 chronic kidney disease, or unspecified chronic kidney disease: Secondary | ICD-10-CM | POA: Diagnosis not present

## 2021-10-14 DIAGNOSIS — N184 Chronic kidney disease, stage 4 (severe): Secondary | ICD-10-CM | POA: Diagnosis not present

## 2021-10-14 DIAGNOSIS — E1122 Type 2 diabetes mellitus with diabetic chronic kidney disease: Secondary | ICD-10-CM | POA: Diagnosis not present

## 2021-10-14 DIAGNOSIS — D631 Anemia in chronic kidney disease: Secondary | ICD-10-CM | POA: Diagnosis not present

## 2021-10-14 DIAGNOSIS — Z9981 Dependence on supplemental oxygen: Secondary | ICD-10-CM | POA: Diagnosis not present

## 2021-10-14 DIAGNOSIS — R627 Adult failure to thrive: Secondary | ICD-10-CM | POA: Diagnosis not present

## 2021-10-14 DIAGNOSIS — Z8673 Personal history of transient ischemic attack (TIA), and cerebral infarction without residual deficits: Secondary | ICD-10-CM | POA: Diagnosis not present

## 2021-10-14 DIAGNOSIS — I272 Pulmonary hypertension, unspecified: Secondary | ICD-10-CM | POA: Diagnosis not present

## 2021-10-14 DIAGNOSIS — I5033 Acute on chronic diastolic (congestive) heart failure: Secondary | ICD-10-CM | POA: Diagnosis not present

## 2021-10-14 DIAGNOSIS — Z87891 Personal history of nicotine dependence: Secondary | ICD-10-CM | POA: Diagnosis not present

## 2021-10-15 ENCOUNTER — Ambulatory Visit: Payer: Medicare Other | Admitting: Nurse Practitioner

## 2021-10-15 DIAGNOSIS — N189 Chronic kidney disease, unspecified: Secondary | ICD-10-CM | POA: Diagnosis not present

## 2021-10-15 DIAGNOSIS — I129 Hypertensive chronic kidney disease with stage 1 through stage 4 chronic kidney disease, or unspecified chronic kidney disease: Secondary | ICD-10-CM | POA: Diagnosis not present

## 2021-10-15 DIAGNOSIS — E673 Hypervitaminosis D: Secondary | ICD-10-CM | POA: Diagnosis not present

## 2021-10-15 DIAGNOSIS — E1122 Type 2 diabetes mellitus with diabetic chronic kidney disease: Secondary | ICD-10-CM | POA: Diagnosis not present

## 2021-10-15 DIAGNOSIS — N1831 Chronic kidney disease, stage 3a: Secondary | ICD-10-CM | POA: Diagnosis not present

## 2021-10-15 DIAGNOSIS — N179 Acute kidney failure, unspecified: Secondary | ICD-10-CM | POA: Diagnosis not present

## 2021-10-15 DIAGNOSIS — E785 Hyperlipidemia, unspecified: Secondary | ICD-10-CM | POA: Diagnosis not present

## 2021-10-15 DIAGNOSIS — R809 Proteinuria, unspecified: Secondary | ICD-10-CM | POA: Diagnosis not present

## 2021-10-15 DIAGNOSIS — I5031 Acute diastolic (congestive) heart failure: Secondary | ICD-10-CM | POA: Diagnosis not present

## 2021-10-15 LAB — COMPREHENSIVE METABOLIC PANEL
Albumin: 3.8 (ref 3.5–5.0)
Calcium: 9.5 (ref 8.7–10.7)

## 2021-10-15 LAB — CBC AND DIFFERENTIAL: Hemoglobin: 8.8 — AB (ref 12.0–16.0)

## 2021-10-15 LAB — BASIC METABOLIC PANEL
BUN: 46 — AB (ref 4–21)
CO2: 33 — AB (ref 13–22)
Chloride: 108 (ref 99–108)
Creatinine: 1.5 — AB (ref 0.5–1.1)
Glucose: 118
Potassium: 4 (ref 3.4–5.3)
Sodium: 145 (ref 137–147)

## 2021-10-20 DIAGNOSIS — Z8673 Personal history of transient ischemic attack (TIA), and cerebral infarction without residual deficits: Secondary | ICD-10-CM | POA: Diagnosis not present

## 2021-10-20 DIAGNOSIS — J449 Chronic obstructive pulmonary disease, unspecified: Secondary | ICD-10-CM | POA: Diagnosis not present

## 2021-10-20 DIAGNOSIS — E039 Hypothyroidism, unspecified: Secondary | ICD-10-CM | POA: Diagnosis not present

## 2021-10-20 DIAGNOSIS — Z9981 Dependence on supplemental oxygen: Secondary | ICD-10-CM | POA: Diagnosis not present

## 2021-10-20 DIAGNOSIS — I130 Hypertensive heart and chronic kidney disease with heart failure and stage 1 through stage 4 chronic kidney disease, or unspecified chronic kidney disease: Secondary | ICD-10-CM | POA: Diagnosis not present

## 2021-10-20 DIAGNOSIS — R627 Adult failure to thrive: Secondary | ICD-10-CM | POA: Diagnosis not present

## 2021-10-20 DIAGNOSIS — Z9181 History of falling: Secondary | ICD-10-CM | POA: Diagnosis not present

## 2021-10-20 DIAGNOSIS — E1122 Type 2 diabetes mellitus with diabetic chronic kidney disease: Secondary | ICD-10-CM | POA: Diagnosis not present

## 2021-10-20 DIAGNOSIS — Z681 Body mass index (BMI) 19 or less, adult: Secondary | ICD-10-CM | POA: Diagnosis not present

## 2021-10-20 DIAGNOSIS — I5033 Acute on chronic diastolic (congestive) heart failure: Secondary | ICD-10-CM | POA: Diagnosis not present

## 2021-10-20 DIAGNOSIS — N184 Chronic kidney disease, stage 4 (severe): Secondary | ICD-10-CM | POA: Diagnosis not present

## 2021-10-20 DIAGNOSIS — D631 Anemia in chronic kidney disease: Secondary | ICD-10-CM | POA: Diagnosis not present

## 2021-10-20 DIAGNOSIS — E43 Unspecified severe protein-calorie malnutrition: Secondary | ICD-10-CM | POA: Diagnosis not present

## 2021-10-20 DIAGNOSIS — Z87891 Personal history of nicotine dependence: Secondary | ICD-10-CM | POA: Diagnosis not present

## 2021-10-20 DIAGNOSIS — I272 Pulmonary hypertension, unspecified: Secondary | ICD-10-CM | POA: Diagnosis not present

## 2021-10-20 IMAGING — CT CT HEAD W/O CM
4 series · 15 of 47 positions shown, 17 images · non-contrast
Comparison: None.

CLINICAL DATA: Memory loss, gait disturbance

EXAM:
CT HEAD WITHOUT CONTRAST
TECHNIQUE: Contiguous axial images were obtained from the base of the skull
through the vertex without intravenous contrast.

[Series 2: head 5.00 hr40 s3 axial ibhc · axial · 0.42mm/px · z∈[-594,-494]mm · 6 of 30 slices shown, 8 images]
[im 5/30  brain]
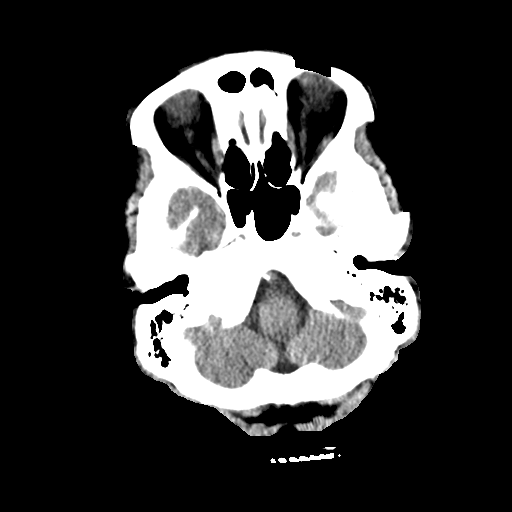
[im 5/30  bone]
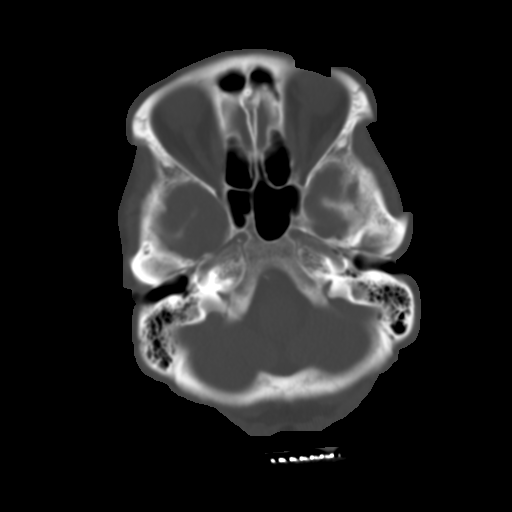
[im 9/30  brain]
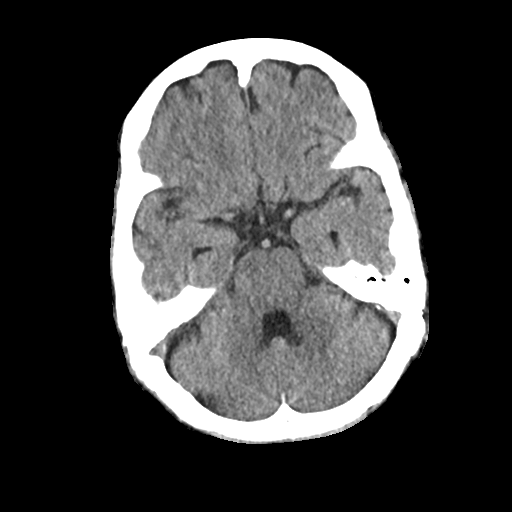
[im 13/30  brain]
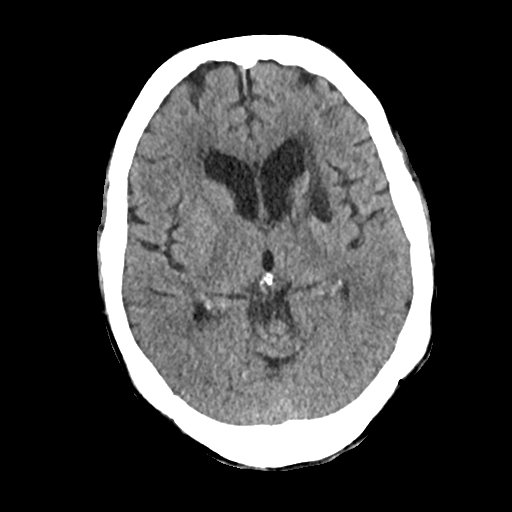
[im 17/30  brain]
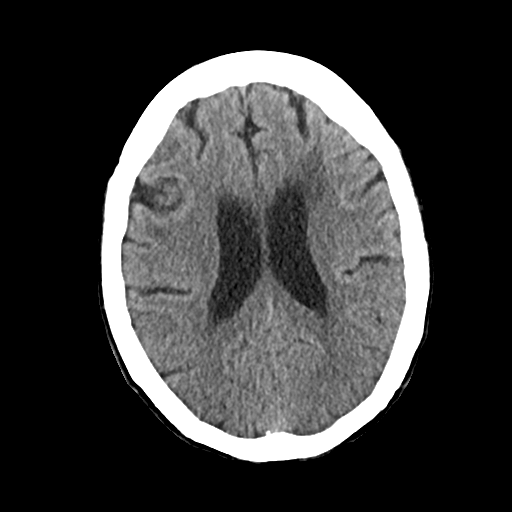
[im 21/30  brain]
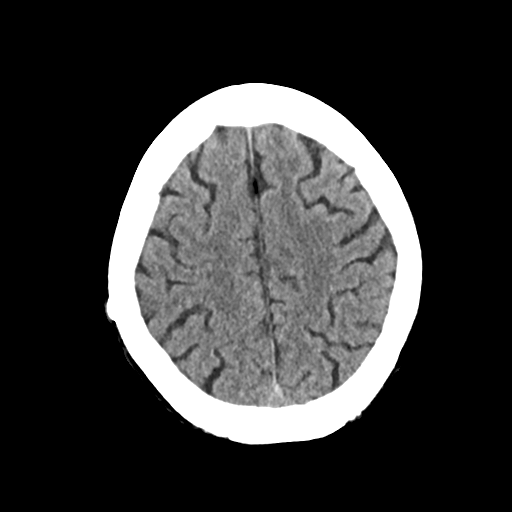
[im 21/30  bone]
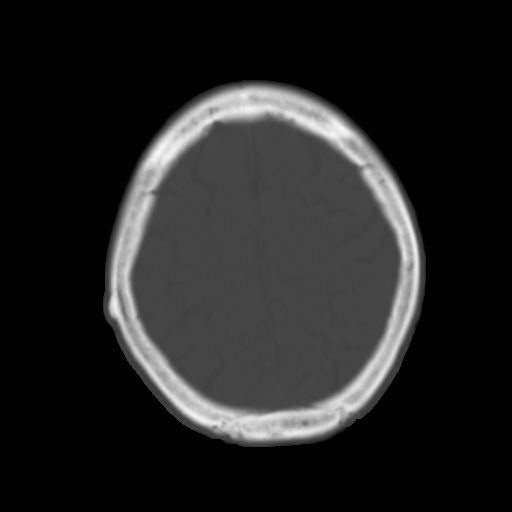
[im 25/30  brain]
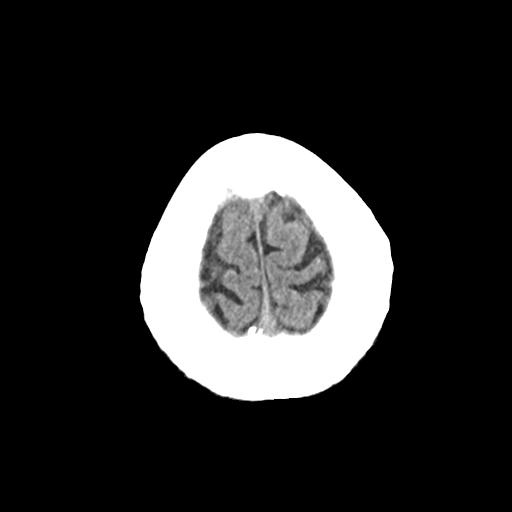

[Series 3: head 2.00 hr60 s3 axial bone · axial · 0.42mm/px · z∈[-602,-566]mm · 3 of 77 slices shown]
[im 8/77  bone]
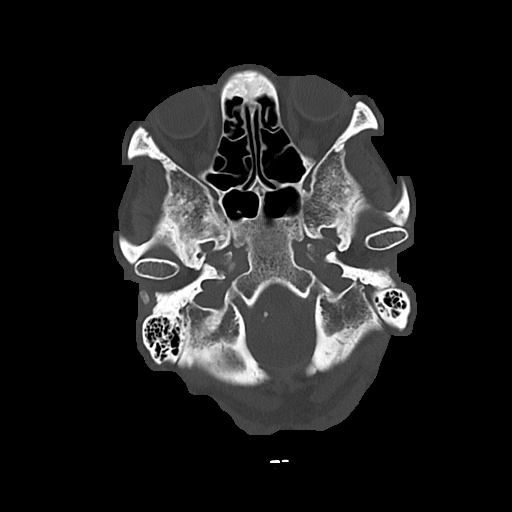
[im 15/77  bone]
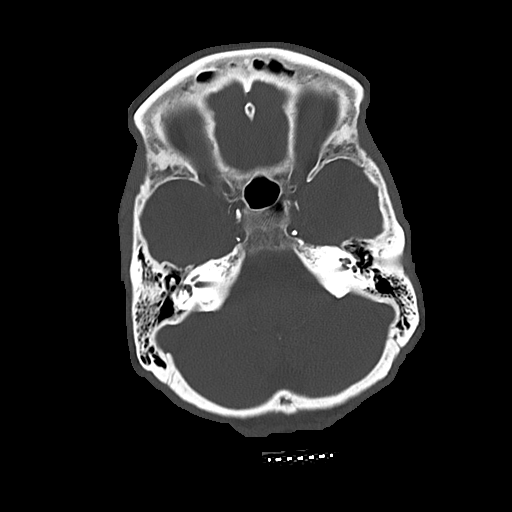
[im 26/77  bone]
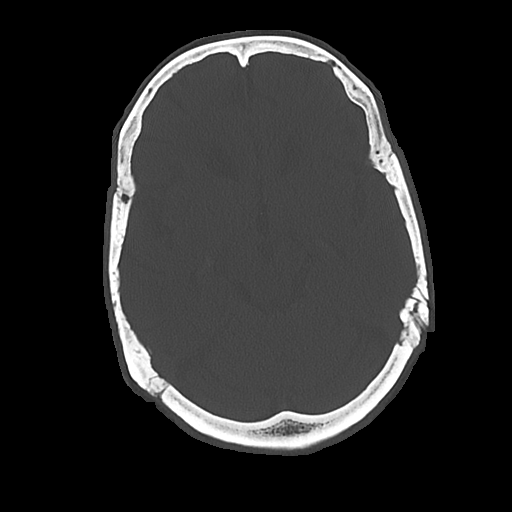

[Series 4: head 3.00 hr40 s3 sag · sagittal · 0.30mm/px · 3 of 71 slices shown]
[im 24/71  brain]
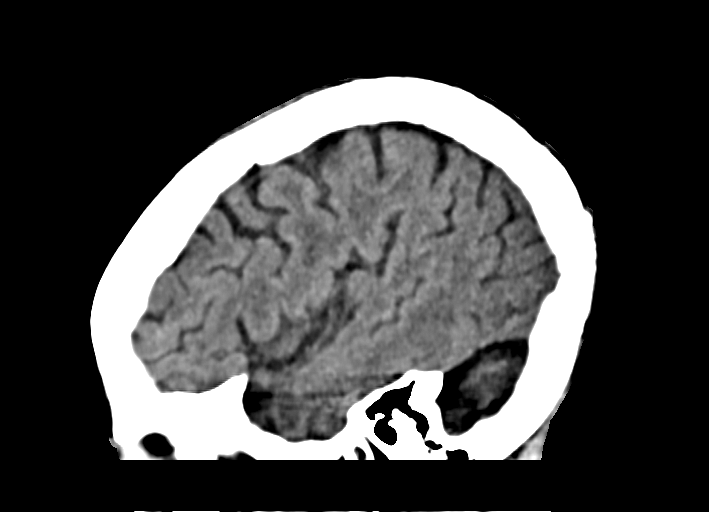
[im 36/71  brain]
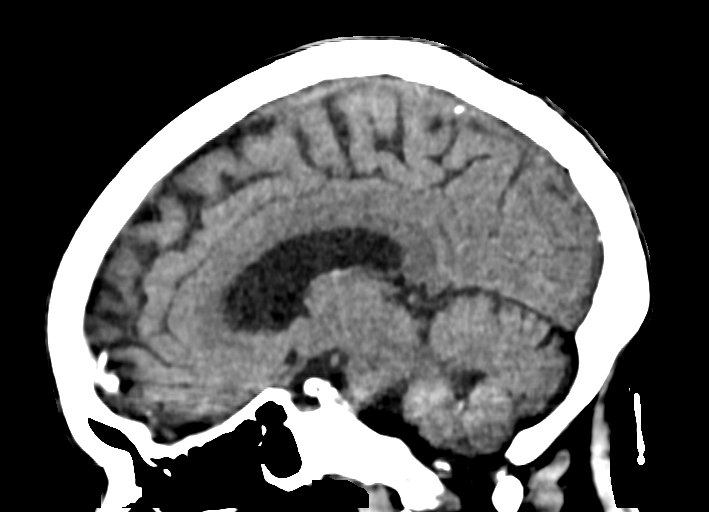
[im 47/71  brain]
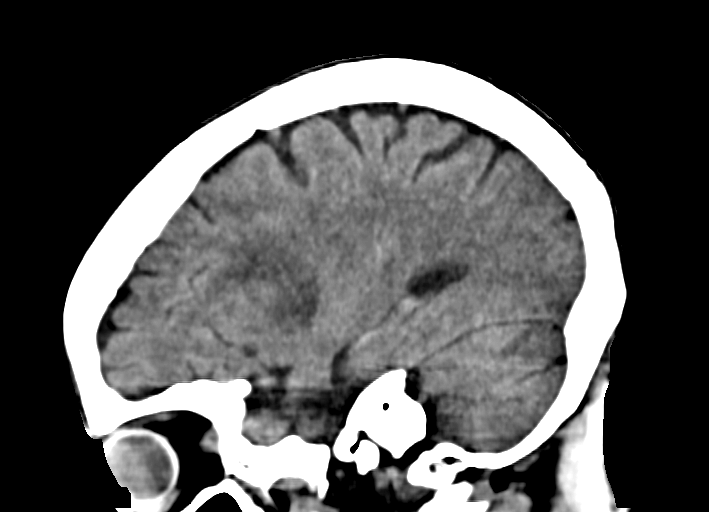

[Series 6: head 3.00 hr40 s3 cor · coronal · 0.30mm/px · 3 of 71 slices shown]
[im 24/71  brain]
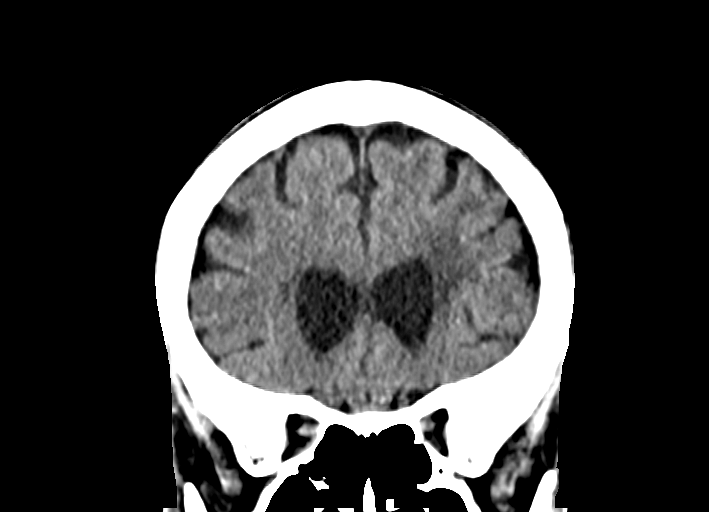
[im 32/71  brain]
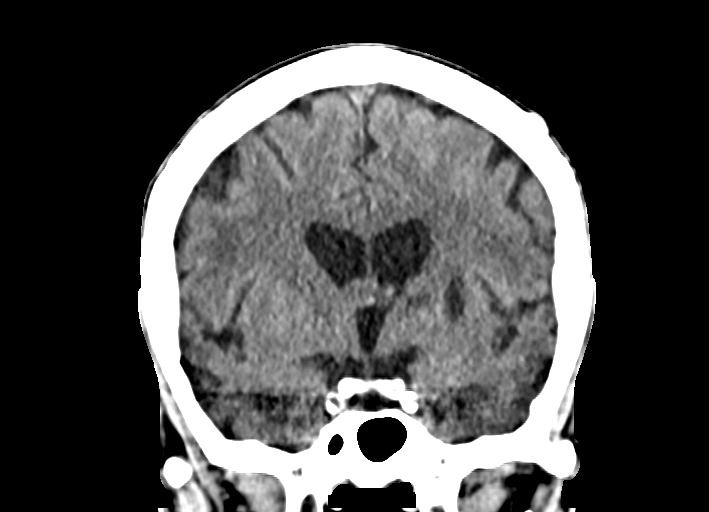
[im 39/71  brain]
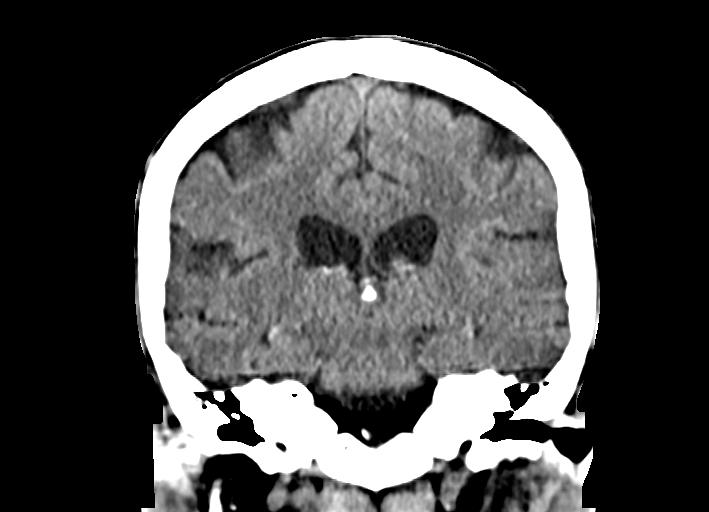

[15 of 47 positions shown; findings below may reference images not displayed]

FINDINGS: Brain: Old infarct in the left basal ganglia and periventricular
white matter. Chronic microvascular disease within the deep white
matter. No acute infarct, hemorrhage or hydrocephalus.

Vascular: No hyperdense vessel or unexpected calcification.

Skull: No acute calvarial abnormality.

Sinuses/Orbits: No acute findings

Other: none
IMPRESSION: Chronic appearing infarct in the left basal ganglia and
periventricular white matter.

Chronic small vessel disease.

No acute intracranial abnormality.

## 2021-10-26 ENCOUNTER — Other Ambulatory Visit: Payer: Self-pay | Admitting: Nurse Practitioner

## 2021-10-26 DIAGNOSIS — R630 Anorexia: Secondary | ICD-10-CM

## 2021-10-26 DIAGNOSIS — R634 Abnormal weight loss: Secondary | ICD-10-CM

## 2021-10-27 ENCOUNTER — Encounter: Payer: Self-pay | Admitting: Nurse Practitioner

## 2021-10-27 DIAGNOSIS — I10 Essential (primary) hypertension: Secondary | ICD-10-CM | POA: Diagnosis not present

## 2021-10-27 DIAGNOSIS — E1169 Type 2 diabetes mellitus with other specified complication: Secondary | ICD-10-CM | POA: Diagnosis not present

## 2021-10-27 DIAGNOSIS — E785 Hyperlipidemia, unspecified: Secondary | ICD-10-CM

## 2021-10-27 DIAGNOSIS — E039 Hypothyroidism, unspecified: Secondary | ICD-10-CM | POA: Diagnosis not present

## 2021-10-27 DIAGNOSIS — I5031 Acute diastolic (congestive) heart failure: Secondary | ICD-10-CM

## 2021-10-28 ENCOUNTER — Other Ambulatory Visit: Payer: Self-pay | Admitting: Nurse Practitioner

## 2021-10-28 DIAGNOSIS — E43 Unspecified severe protein-calorie malnutrition: Secondary | ICD-10-CM | POA: Diagnosis not present

## 2021-11-25 DIAGNOSIS — E43 Unspecified severe protein-calorie malnutrition: Secondary | ICD-10-CM | POA: Diagnosis not present

## 2021-11-28 ENCOUNTER — Other Ambulatory Visit: Payer: Self-pay | Admitting: Nurse Practitioner

## 2021-12-02 ENCOUNTER — Other Ambulatory Visit: Payer: Self-pay

## 2021-12-02 DIAGNOSIS — R630 Anorexia: Secondary | ICD-10-CM

## 2021-12-02 DIAGNOSIS — N183 Chronic kidney disease, stage 3 unspecified: Secondary | ICD-10-CM

## 2021-12-02 DIAGNOSIS — I129 Hypertensive chronic kidney disease with stage 1 through stage 4 chronic kidney disease, or unspecified chronic kidney disease: Secondary | ICD-10-CM

## 2021-12-02 MED ORDER — ROSUVASTATIN CALCIUM 20 MG PO TABS: 20.0000 mg | ORAL_TABLET | Freq: Every day | ORAL | 1 refills | Status: DC

## 2021-12-02 MED ORDER — MIRTAZAPINE 15 MG PO TABS: 30.0000 mg | ORAL_TABLET | Freq: Every day | ORAL | 2 refills | Status: DC

## 2021-12-02 MED ORDER — AMLODIPINE BESYLATE 10 MG PO TABS: 10.0000 mg | ORAL_TABLET | Freq: Every day | ORAL | 11 refills | Status: AC

## 2021-12-03 ENCOUNTER — Other Ambulatory Visit: Payer: Self-pay | Admitting: *Deleted

## 2021-12-03 MED ORDER — MEMANTINE HCL 10 MG PO TABS: 10.0000 mg | ORAL_TABLET | Freq: Every day | ORAL | 3 refills | Status: DC

## 2021-12-04 ENCOUNTER — Other Ambulatory Visit: Payer: Self-pay | Admitting: *Deleted

## 2021-12-04 MED ORDER — FLUTICASONE-SALMETEROL 100-50 MCG/ACT IN AEPB: 1.0000 | INHALATION_SPRAY | Freq: Two times a day (BID) | RESPIRATORY_TRACT | 3 refills | Status: AC

## 2021-12-04 MED ORDER — ALBUTEROL SULFATE HFA 108 (90 BASE) MCG/ACT IN AERS: 2.0000 | INHALATION_SPRAY | Freq: Four times a day (QID) | RESPIRATORY_TRACT | 3 refills | Status: DC | PRN

## 2021-12-13 ENCOUNTER — Other Ambulatory Visit: Payer: Self-pay | Admitting: Nurse Practitioner

## 2021-12-15 ENCOUNTER — Other Ambulatory Visit: Payer: Self-pay | Admitting: Neurology

## 2021-12-17 ENCOUNTER — Other Ambulatory Visit: Payer: Self-pay

## 2021-12-17 ENCOUNTER — Ambulatory Visit (INDEPENDENT_AMBULATORY_CARE_PROVIDER_SITE_OTHER): Payer: Medicare Other | Admitting: Nurse Practitioner

## 2021-12-17 ENCOUNTER — Encounter: Payer: Self-pay | Admitting: Nurse Practitioner

## 2021-12-17 VITALS — BP 104/60 | HR 58 | Temp 98.2°F

## 2021-12-17 DIAGNOSIS — I10 Essential (primary) hypertension: Secondary | ICD-10-CM

## 2021-12-17 DIAGNOSIS — E1169 Type 2 diabetes mellitus with other specified complication: Secondary | ICD-10-CM

## 2021-12-17 DIAGNOSIS — Z23 Encounter for immunization: Secondary | ICD-10-CM

## 2021-12-17 DIAGNOSIS — E785 Hyperlipidemia, unspecified: Secondary | ICD-10-CM | POA: Diagnosis not present

## 2021-12-17 DIAGNOSIS — E039 Hypothyroidism, unspecified: Secondary | ICD-10-CM

## 2021-12-17 DIAGNOSIS — I110 Hypertensive heart disease with heart failure: Secondary | ICD-10-CM | POA: Diagnosis not present

## 2021-12-17 DIAGNOSIS — E1122 Type 2 diabetes mellitus with diabetic chronic kidney disease: Secondary | ICD-10-CM

## 2021-12-17 DIAGNOSIS — J449 Chronic obstructive pulmonary disease, unspecified: Secondary | ICD-10-CM

## 2021-12-17 DIAGNOSIS — E43 Unspecified severe protein-calorie malnutrition: Secondary | ICD-10-CM

## 2021-12-17 DIAGNOSIS — I5033 Acute on chronic diastolic (congestive) heart failure: Secondary | ICD-10-CM

## 2021-12-17 DIAGNOSIS — I5032 Chronic diastolic (congestive) heart failure: Secondary | ICD-10-CM

## 2021-12-17 DIAGNOSIS — N183 Chronic kidney disease, stage 3 unspecified: Secondary | ICD-10-CM | POA: Diagnosis not present

## 2021-12-17 NOTE — Progress Notes (Signed)
?Industrial/product designer as a Education administrator for [employer], FNP.,have documented all relevant documentation on the behalf of [name], FNP,as directed by  [name], FNP while in the presence of [name], [place]. ? ?This visit occurred during the SARS-CoV-2 public health emergency.  Safety protocols were in place, including screening questions prior to the visit, additional usage of staff PPE, and extensive cleaning of exam room while observing appropriate contact time as indicated for disinfecting solutions. ? ?Subjective:  ?  ? Patient ID: Leslie Reeves , female    DOB: June 28, 1941 , 81 y.o.   MRN: [id number] ? ? ?Chief Complaint  ?Patient presents with  ? Hypertension  ? ? ?HPI ? ?Patient presents today for a bp check. She is due to see Nephrology in April.  Her last visit with Cardiology she made a decision to be home and comfortable.  ? ?Hypertension ?This is a chronic problem. The current episode started more than 1 year ago. The problem has been gradually improving since onset. The problem is controlled. Pertinent negatives include no anxiety, chest pain, headaches, palpitations or shortness of breath. Past treatments include calcium channel blockers. The current treatment provides significant improvement.   ? ?Past Medical History:  ?Diagnosis Date  ? Allergy   ? Diabetes mellitus without complication ([place])   ? Hypertension   ?  ? ?Family History  ?Problem Relation Age of Onset  ? Heart disease Other   ? Stroke Other   ? Hypertension Other   ? COPD Other   ? Cancer Other   ? Asthma Other   ? Breast cancer Neg Hx   ? ? ? ?Current Outpatient Medications:  ?  Accu-Chek Softclix Lancets lancets, Use as instructed (Patient not taking: Reported on 12/21/2021), Disp: 100 each, Rfl: 12 ?  acetaminophen (TYLENOL) 325 MG tablet, Take 2 tablets (650 mg total) by mouth every 6 (six) hours as needed for headache or mild pain. (Patient not taking: Reported on 12/21/2021), Disp: , Rfl:  ?  albuterol (VENTOLIN HFA) 108 (90  Base) MCG/ACT inhaler, Inhale 2 puffs into the lungs every 6 (six) hours as needed for wheezing or shortness of breath., Disp: 24 g, Rfl: 3 ?  amLODipine (NORVASC) 10 MG tablet, Take 1 tablet (10 mg total) by mouth daily., Disp: 30 tablet, Rfl: 11 ?  blood glucose meter kit and supplies KIT, Dispense based on patient and insurance preference. Use up to four times daily as directed. (FOR ICD-9 250.00, 250.01). (Patient not taking: Reported on 12/21/2021), Disp: 1 each, Rfl: 0 ?  diclofenac sodium (VOLTAREN) 1 % GEL, Apply 2 g topically 4 (four) times daily. (Patient not taking: Reported on 12/21/2021), Disp: 100 g, Rfl: 1 ?  fluticasone-salmeterol (WIXELA INHUB) 100-50 MCG/ACT AEPB, Inhale 1 puff into the lungs 2 (two) times daily., Disp: 180 each, Rfl: 3 ?  glucose blood (ACCU-CHEK AVIVA PLUS) test strip, Use as instructed (Patient not taking: Reported on 12/21/2021), Disp: 300 each, Rfl: 3 ?  isosorbide-hydrALAZINE (BIDIL) 20-37.5 MG tablet, Take 1 tablet by mouth 3 (three) times daily., Disp: 30 tablet, Rfl: 0 ?  levothyroxine (SYNTHROID) 100 MCG tablet, Take 1 tablet (100 mcg total) by mouth daily., Disp: 30 tablet, Rfl: 2 ?  memantine (NAMENDA) 10 MG tablet, Take 1 tablet (10 mg total) by mouth daily., Disp: 90 tablet, Rfl: 3 ?  metoprolol succinate (TOPROL-XL) 25 MG 24 hr tablet, Take 1 tablet (25 mg total) by mouth daily., Disp: 30 tablet, Rfl: 2 ?  mirtazapine (REMERON)  15 MG tablet, Take 2 tablets (30 mg total) by mouth at bedtime. (Patient taking differently: Take 15 mg by mouth at bedtime.), Disp: 30 tablet, Rfl: 2 ?  rosuvastatin (CRESTOR) 20 MG tablet, Take 1 tablet (20 mg total) by mouth daily., Disp: 90 tablet, Rfl: 1  ? ?Allergies  ?Allergen Reactions  ? Penicillin G Rash  ? Quinapril Other (See Comments)  ? Penicillins Rash  ?  ? ?Review of Systems  ?Constitutional: Negative.   ?Respiratory: Negative.  Negative for shortness of breath.   ?Cardiovascular: Negative.  Negative for chest pain and  palpitations.  ?Gastrointestinal: Negative.   ?Neurological: Negative.  Negative for headaches.   ? ?Today's Vitals  ? 12/17/21 1017  ?Pulse: (!) 58  ?Temp: 98.2 ?F (36.8 ?C)  ?TempSrc: Oral  ? ?There is no height or weight on file to calculate BMI.  ? ?Objective:  ?Physical Exam ?Vitals reviewed.  ?Constitutional:   ?   General: She is not in acute distress. ?   Appearance: Normal appearance.  ?   Comments: Frail and thin appearance  ?Cardiovascular:  ?   Rate and Rhythm: Normal rate and regular rhythm.  ?   Pulses: Normal pulses.  ?   Heart sounds: Normal heart sounds. No murmur heard. ?Pulmonary:  ?   Effort: Pulmonary effort is normal. No respiratory distress.  ?   Breath sounds: Normal breath sounds. No wheezing.  ?Neurological:  ?   General: No focal deficit present.  ?   Mental Status: She is alert and oriented to person, place, and time.  ?   Cranial Nerves: No cranial nerve deficit.  ?   Motor: No weakness.  ?Psychiatric:     ?   Mood and Affect: Mood normal.     ?   Behavior: Behavior normal.     ?   Thought Content: Thought content normal.     ?   Judgment: Judgment normal.  ?  ? ?   ?Assessment And Plan:  ?   ?1. Hypertensive heart disease with chronic diastolic congestive heart failure ([place]) ?Comments: per cardiology notes she is to not have strict goals due to disease progression. Blood pressure is slightly elevated.  ?- AMB Referral to [facility] ? ?2. Acquired hypothyroidism ?Comments: Good control, continue current medications ?- T3, free ?- T4 ?- TSH ? ?3. Controlled type 2 diabetes mellitus with stage 3 chronic kidney disease, without long-term current use of insulin ([place]) ?Comments: HgbA1c has been stable, no current medications ?- Hemoglobin A1c ?- Ambulatory referral to Ophthalmology ? ?4. Severe malnutrition ([place]) ?Comments: Encouraged to continue with high protein diet ? ?5. Chronic obstructive pulmonary disease, unspecified COPD type ([place]) ?Comments: Currently stable.  Continue using inhalers as directed ? ?6. Need for vaccination ?Comments: TransRx done for Tdap ?- Tdap vaccine greater than or equal to 7yo IM ?  ? ? ?Patient was given opportunity to ask questions. Patient verbalized understanding of the plan and was able to repeat key elements of the plan. All questions were answered to their satisfaction.  ?[name], FNP  ? ?I, [name], FNP, have reviewed all documentation for this visit. The documentation on 12/17/21 for the exam, diagnosis, procedures, and orders are all accurate and complete.  ? ? ?IF YOU HAVE BEEN REFERRED TO A SPECIALIST, IT MAY TAKE 1-2 WEEKS TO SCHEDULE/PROCESS THE REFERRAL. IF YOU HAVE NOT HEARD FROM US/SPECIALIST IN TWO WEEKS, PLEASE GIVE [place] A CALL AT [phone].  ? ?THE PATIENT  IS ENCOURAGED TO PRACTICE SOCIAL DISTANCING DUE TO THE COVID-19 PANDEMIC.   ?

## 2021-12-17 NOTE — Patient Instructions (Signed)
Hypertension, Adult ?High blood pressure (hypertension) is when the force of blood pumping through the arteries is too strong. The arteries are the blood vessels that carry blood from the heart throughout the body. Hypertension forces the heart to work harder to pump blood and may cause arteries to become narrow or stiff. Untreated or uncontrolled hypertension can cause a heart attack, heart failure, a stroke, kidney disease, and other problems. ?A blood pressure reading consists of a higher number over a lower number. Ideally, your blood pressure should be below 120/80. The first ("top") number is called the systolic pressure. It is a measure of the pressure in your arteries as your heart beats. The second ("bottom") number is called the diastolic pressure. It is a measure of the pressure in your arteries as the heart relaxes. ?What are the causes? ?The exact cause of this condition is not known. There are some conditions that result in or are related to high blood pressure. ?What increases the risk? ?Some risk factors for high blood pressure are under your control. The following factors may make you more likely to develop this condition: ?Smoking. ?Having type 2 diabetes mellitus, high cholesterol, or both. ?Not getting enough exercise or physical activity. ?Being overweight. ?Having too much fat, sugar, calories, or salt (sodium) in your diet. ?Drinking too much alcohol. ?Some risk factors for high blood pressure may be difficult or impossible to change. Some of these factors include: ?Having chronic kidney disease. ?Having a family history of high blood pressure. ?Age. Risk increases with age. ?Race. You may be at higher risk if you are African American. ?Gender. Men are at higher risk than women before age 57. After age 32, women are at higher risk than men. ?Having obstructive sleep apnea. ?Stress. ?What are the signs or symptoms? ?High blood pressure may not cause symptoms. Very high blood pressure  (hypertensive crisis) may cause: ?Headache. ?Anxiety. ?Shortness of breath. ?Nosebleed. ?Nausea and vomiting. ?Vision changes. ?Severe chest pain. ?Seizures. ?How is this diagnosed? ?This condition is diagnosed by measuring your blood pressure while you are seated, with your arm resting on a flat surface, your legs uncrossed, and your feet flat on the floor. The cuff of the blood pressure monitor will be placed directly against the skin of your upper arm at the level of your heart. It should be measured at least twice using the same arm. Certain conditions can cause a difference in blood pressure between your right and left arms. ?Certain factors can cause blood pressure readings to be lower or higher than normal for a short period of time: ?When your blood pressure is higher when you are in a health care provider's office than when you are at home, this is called white coat hypertension. Most people with this condition do not need medicines. ?When your blood pressure is higher at home than when you are in a health care provider's office, this is called masked hypertension. Most people with this condition may need medicines to control blood pressure. ?If you have a high blood pressure reading during one visit or you have normal blood pressure with other risk factors, you may be asked to: ?Return on a different day to have your blood pressure checked again. ?Monitor your blood pressure at home for 1 week or longer. ?If you are diagnosed with hypertension, you may have other blood or imaging tests to help your health care provider understand your overall risk for other conditions. ?How is this treated? ?This condition is treated by making  healthy lifestyle changes, such as eating healthy foods, exercising more, and reducing your alcohol intake. Your health care provider may prescribe medicine if lifestyle changes are not enough to get your blood pressure under control, and if: ?Your systolic blood pressure is above  130. ?Your diastolic blood pressure is above 80. ?Your personal target blood pressure may vary depending on your medical conditions, your age, and other factors. ?Follow these instructions at home: ?Eating and drinking ? ?Eat a diet that is high in fiber and potassium, and low in sodium, added sugar, and fat. An example eating plan is called the DASH (Dietary Approaches to Stop Hypertension) diet. To eat this way: ?Eat plenty of fresh fruits and vegetables. Try to fill one half of your plate at each meal with fruits and vegetables. ?Eat whole grains, such as whole-wheat pasta, brown rice, or whole-grain bread. Fill about one fourth of your plate with whole grains. ?Eat or drink low-fat dairy products, such as skim milk or low-fat yogurt. ?Avoid fatty cuts of meat, processed or cured meats, and poultry with skin. Fill about one fourth of your plate with lean proteins, such as fish, chicken without skin, beans, eggs, or tofu. ?Avoid pre-made and processed foods. These tend to be higher in sodium, added sugar, and fat. ?Reduce your daily sodium intake. Most people with hypertension should eat less than 1,500 mg of sodium a day. ?Do not drink alcohol if: ?Your health care provider tells you not to drink. ?You are pregnant, may be pregnant, or are planning to become pregnant. ?If you drink alcohol: ?Limit how much you use to: ?0-1 drink a day for women. ?0-2 drinks a day for men. ?Be aware of how much alcohol is in your drink. In the [place] one drink equals one 12 oz bottle of beer (355 mL), one 5 oz glass of wine (148 mL), or one 1? oz glass of hard liquor (44 mL). ?Lifestyle ? ?Work with your health care provider to maintain a healthy body weight or to lose weight. Ask what an ideal weight is for you. ?Get at least 30 minutes of exercise most days of the week. Activities may include walking, swimming, or biking. ?Include exercise to strengthen your muscles (resistance exercise), such as Pilates or lifting weights, as  part of your weekly exercise routine. Try to do these types of exercises for 30 minutes at least 3 days a week. ?Do not use any products that contain nicotine or tobacco, such as cigarettes, e-cigarettes, and chewing tobacco. If you need help quitting, ask your health care provider. ?Monitor your blood pressure at home as told by your health care provider. ?Keep all follow-up visits as told by your health care provider. This is important. ?Medicines ?Take over-the-counter and prescription medicines only as told by your health care provider. Follow directions carefully. Blood pressure medicines must be taken as prescribed. ?Do not skip doses of blood pressure medicine. Doing this puts you at risk for problems and can make the medicine less effective. ?Ask your health care provider about side effects or reactions to medicines that you should watch for. ?Contact a health care provider if you: ?Think you are having a reaction to a medicine you are taking. ?Have headaches that keep coming back (recurring). ?Feel dizzy. ?Have swelling in your ankles. ?Have trouble with your vision. ?Get help right away if you: ?Develop a severe headache or confusion. ?Have unusual weakness or numbness. ?Feel faint. ?Have severe pain in your chest or abdomen. ?Vomit repeatedly. ?Have trouble  breathing. ?Summary ?Hypertension is when the force of blood pumping through your arteries is too strong. If this condition is not controlled, it may put you at risk for serious complications. ?Your personal target blood pressure may vary depending on your medical conditions, your age, and other factors. For most people, a normal blood pressure is less than 120/80. ?Hypertension is treated with lifestyle changes, medicines, or a combination of both. Lifestyle changes include losing weight, eating a healthy, low-sodium diet, exercising more, and limiting alcohol. ?This information is not intended to replace advice given to you by your health care  provider. Make sure you discuss any questions you have with your health care provider. ?Document Revised: 05/24/2018 Document Reviewed: 05/24/2018 ?Elsevier Patient Education ? [place]. ? ?

## 2021-12-18 LAB — T4: T4, Total: 9.5 ug/dL (ref 4.5–12.0)

## 2021-12-18 LAB — HEMOGLOBIN A1C
Est. average glucose Bld gHb Est-mCnc: 82 mg/dL
Hgb A1c MFr Bld: 4.5 % — ABNORMAL LOW (ref 4.8–5.6)

## 2021-12-18 LAB — TSH: TSH: 2 u[IU]/mL (ref 0.450–4.500)

## 2021-12-18 LAB — T3, FREE: T3, Free: 1.1 pg/mL — ABNORMAL LOW (ref 2.0–4.4)

## 2021-12-21 ENCOUNTER — Telehealth: Payer: Commercial Managed Care - HMO

## 2021-12-21 ENCOUNTER — Ambulatory Visit (INDEPENDENT_AMBULATORY_CARE_PROVIDER_SITE_OTHER): Payer: Medicare Other

## 2021-12-21 DIAGNOSIS — E46 Unspecified protein-calorie malnutrition: Secondary | ICD-10-CM

## 2021-12-21 DIAGNOSIS — E1169 Type 2 diabetes mellitus with other specified complication: Secondary | ICD-10-CM

## 2021-12-21 DIAGNOSIS — E43 Unspecified severe protein-calorie malnutrition: Secondary | ICD-10-CM

## 2021-12-21 DIAGNOSIS — I5033 Acute on chronic diastolic (congestive) heart failure: Secondary | ICD-10-CM

## 2021-12-21 DIAGNOSIS — R634 Abnormal weight loss: Secondary | ICD-10-CM

## 2021-12-21 DIAGNOSIS — N1832 Chronic kidney disease, stage 3b: Secondary | ICD-10-CM

## 2021-12-21 DIAGNOSIS — I10 Essential (primary) hypertension: Secondary | ICD-10-CM

## 2021-12-21 DIAGNOSIS — E538 Deficiency of other specified B group vitamins: Secondary | ICD-10-CM

## 2021-12-21 NOTE — Patient Instructions (Signed)
Visit Information ? ?Thank you for taking time to visit with me today. Please don't hesitate to contact me if I can be of assistance to you before our next scheduled telephone appointment. ? ?Following are the goals we discussed today:  ?(Copy and paste patient goals from clinical care plan here) ? ?Our next appointment is by telephone on 03/25/22 at 12 PM  ? ?Please call the care guide team at [phone] if you need to cancel or reschedule your appointment.  ? ?If you are experiencing a Mental Health or [place] or need someone to talk to, please call [phone]-TALK (toll free, 24 hour hotline)  ? ?Patient verbalizes understanding of instructions and care plan provided today and agrees to view in [place]. Active MyChart status confirmed with patient.   ? ?Barb Merino, RN, BSN, CCM ?Care Management Coordinator ?[facility]  ?Direct Phone: [phone] ? ? ?

## 2021-12-21 NOTE — Progress Notes (Signed)
This encounter was created in error - please disregard. ?

## 2021-12-21 NOTE — Chronic Care Management (AMB) (Signed)
?Chronic Care Management  ? ?CCM RN Visit Note ? ?12/21/2021 ?Name: Leslie Reeves MRN: [id number] DOB: 02-13-1941 ? ?Subjective: ?Leslie Reeves is a 81 y.o. year old female who is a primary care patient of [name], [place]. The care management team was consulted for assistance with disease management and care coordination needs.   ? ?Engaged with patient by telephone for follow up visit in response to provider referral for case management and/or care coordination services.  ? ?Consent to Services:  ?The patient was given information about Chronic Care Management services, agreed to services, and gave verbal consent prior to initiation of services.  Please see initial visit note for detailed documentation.  ? ?Patient agreed to services and verbal consent obtained.  ? ?Assessment: Review of patient past medical history, allergies, medications, health status, including review of consultants reports, laboratory and other test data, was performed as part of comprehensive evaluation and provision of chronic care management services.  ? ?SDOH (Social Determinants of Health) assessments and interventions performed:  Yes, no acute changes  ? ?CCM Care Plan ? ?Allergies  ?Allergen Reactions  ? Penicillin G Rash  ? Quinapril Other (See Comments)  ? Penicillins Rash  ? ? ?Outpatient Encounter Medications as of 12/21/2021  ?Medication Sig  ? albuterol (VENTOLIN HFA) 108 (90 Base) MCG/ACT inhaler Inhale 2 puffs into the lungs every 6 (six) hours as needed for wheezing or shortness of breath.  ? amLODipine (NORVASC) 10 MG tablet Take 1 tablet (10 mg total) by mouth daily.  ? fluticasone-salmeterol (WIXELA INHUB) 100-50 MCG/ACT AEPB Inhale 1 puff into the lungs 2 (two) times daily.  ? isosorbide-hydrALAZINE (BIDIL) 20-37.5 MG tablet Take 1 tablet by mouth 3 (three) times daily.  ? levothyroxine (SYNTHROID) 100 MCG tablet Take 1 tablet (100 mcg total) by mouth daily.  ? memantine (NAMENDA) 10 MG tablet Take 1 tablet (10 mg total)  by mouth daily.  ? mirtazapine (REMERON) 15 MG tablet Take 2 tablets (30 mg total) by mouth at bedtime. (Patient taking differently: Take 15 mg by mouth at bedtime.)  ? rosuvastatin (CRESTOR) 20 MG tablet Take 1 tablet (20 mg total) by mouth daily.  ? Accu-Chek Softclix Lancets lancets Use as instructed (Patient not taking: Reported on 12/21/2021)  ? acetaminophen (TYLENOL) 325 MG tablet Take 2 tablets (650 mg total) by mouth every 6 (six) hours as needed for headache or mild pain. (Patient not taking: Reported on 12/21/2021)  ? blood glucose meter kit and supplies KIT Dispense based on patient and insurance preference. Use up to four times daily as directed. (FOR ICD-9 250.00, 250.01). (Patient not taking: Reported on 12/21/2021)  ? diclofenac sodium (VOLTAREN) 1 % GEL Apply 2 g topically 4 (four) times daily. (Patient not taking: Reported on 12/21/2021)  ? glucose blood (ACCU-CHEK AVIVA PLUS) test strip Use as instructed (Patient not taking: Reported on 12/21/2021)  ? metoprolol succinate (TOPROL-XL) 25 MG 24 hr tablet Take 1 tablet (25 mg total) by mouth daily.  ? ?No facility-administered encounter medications on file as of 12/21/2021.  ? ? ?Patient Active Problem List  ? Diagnosis Date Noted  ? Protein-calorie malnutrition, severe 07/17/2021  ? Severe anemia 07/17/2021  ? Hypothyroidism 07/16/2021  ? Type 2 diabetes mellitus with hyperlipidemia ([place]) 07/16/2021  ? Failure to thrive in adult 07/16/2021  ? COPD (chronic obstructive pulmonary disease) ([place]) 07/16/2021  ? Acute on chronic diastolic CHF (congestive heart failure), NYHA class 3 ([place]) 07/15/2021  ? Chronic kidney disease (CKD) stage G3a/A1, moderately  decreased glomerular filtration rate (GFR) between 45-59 mL/min/1.73 square meter and albuminuria creatinine ratio less than 30 mg/g ([place]) 06/13/2021  ? DM2 (diabetes mellitus, type 2) ([place]) 06/12/2021  ? AKI (acute kidney injury) ([place]) 06/12/2021  ? New onset of congestive heart failure ([place]) 06/12/2021  ?  Solitary pulmonary nodule 05/05/2020  ? Mild intermittent asthma 05/05/2020  ? Decreased appetite 02/05/2019  ? Prediabetes 02/05/2019  ? Essential hypertension 02/05/2019  ? Right thyroid nodule 10/09/2018  ? ? ?Conditions to be addressed/monitored: Essential hypertension, Decreased appetite, Abnormal weight loss, Malnutrition, B12 Deficiency, Stage 3b chronic kidney disease, Acute Diastolic Congestive Heart Failure, Acquired hypothyroidism  ? ?Care Plan : RN Care Manager Plan of Care  ?Updates made by Lynne Logan, RN since 12/21/2021 12:00 AM  ?  ? ?Problem: No Plan of Care established for management of chronic disease states (Essential hypertension, Decreased appetite, Abnormal weight loss, Malnutrition, B12 Deficiency, Stage 3b chronic kidney disease, Acute Diastolic CHF, Aquired hypothyroidism)   ?Priority: High  ?  ? ?Long-Range Goal: Establishment of plan of care for management of chronic disease states (Essential hypertension, Decreased appetite, Abnormal weight loss, Malnutrition, B12 Deficiency, Stage 3b chronic kidney disease, Acute Diastolic CHF, Acquired hypothyroidism)   ?Start Date: 10/02/2021  ?Expected End Date: 10/01/2022  ?Recent Progress: On track  ?Priority: High  ?Note:   ?Current Barriers:  ?Knowledge Deficits related to plan of care for management of Essential hypertension, Decreased appetite, Abnormal weight loss, Malnutrition, B12 Deficiency, Stage 3b chronic kidney disease, Acute Diastolic Congestive Heart Failure   ?Chronic Disease Management support and education needs related to Essential hypertension, Decreased appetite, Abnormal weight loss, Malnutrition, B12 Deficiency, Stage 3b chronic kidney disease, Acute Diastolic Congestive Heart Failure   ? ?RNCM Clinical Goal(s):  ?Patient will verbalize basic understanding of  Essential hypertension, Decreased appetite, Abnormal weight loss, Malnutrition, B12 Deficiency, Stage 3b chronic kidney disease, Acute Diastolic Congestive Heart Failure   disease process and self health management plan as evidenced by patient will report having no disease exacerbations related to her chronic disease states as listed above ?take all medications exactly as prescribed and will call provider for medication related questions as evidenced by patient will experience no adverse effects related to her medication regimen  ?continue to work with RN Care Manager to address care management and care coordination needs related to  Essential hypertension, Decreased appetite, Abnormal weight loss, Malnutrition, B12 Deficiency, Stage 3b chronic kidney disease, Acute Diastolic Congestive Heart Failure  as evidenced by adherence to CM Team Scheduled appointments through collaboration with RN Care manager, provider, and care team.  ? ?Interventions: ?1:1 collaboration with primary care provider regarding development and update of comprehensive plan of care as evidenced by provider attestation and co-signature ?Inter-disciplinary care team collaboration (see longitudinal plan of care) ?Evaluation of current treatment plan related to  self management and patient's adherence to plan as established by provider ?Completed successful outbound call with daughter [name]  ? ? Chronic Kidney Disease Interventions:  (Status:  Goal on track:  Yes.) Long Term Goal ?Assessed the daughter [name] understanding of chronic kidney disease    ?Evaluation of current treatment plan related to chronic kidney disease self management and patient's adherence to plan as established by provider      ?Reviewed prescribed diet renal  ?Provided education on kidney disease progression    ?Last practice recorded BP readings:  ?BP Readings from Last 3 Encounters:  ?09/02/21 138/72  ?08/24/21 (!) 183/65  ?08/13/21 (!) 150/50  ?Most recent  eGFR/CrCl:  ?Lab Results  ?Component Value Date  ? EGFR 26 (L) 07/29/2021  ?  No components found for: CRCL  ? ?Heart Failure Interventions:  (Status:  Goal on track:  Yes.) Long Term  Goal ?Evaluation of current treatment plan related to heart failure, self-management and patient's adherence to plan as established by provider ?Provided education on low sodium diet ?Reviewed Heart Fa

## 2021-12-22 ENCOUNTER — Telehealth: Payer: Self-pay | Admitting: *Deleted

## 2021-12-22 ENCOUNTER — Telehealth: Payer: Self-pay

## 2021-12-22 NOTE — Chronic Care Management (AMB) (Signed)
? ? ?Chronic Care Management ?Pharmacy Assistant  ? ?Name: Leslie Reeves  MRN: [id number] DOB: 21-Oct-1940 ? ?Reason for Encounter: Chart review for CPP visit on 12-23-2021 ?  ?Conditions to be addressed/monitored: ?CHF, HTN, DMII, and CKD Stage G3a/A1 ? ?Recent office visits:  ?12-22-2021 Leslie Reeves, Leslie Stapler, RN (CCM) ? ?12-17-2021 Leslie Reeves, [place]. A1C= 4.5. T3= 1.1. Referral placed to ophthalmology. STOP Megace. TDAP given. ? ?10-06-2021 Leslie Reeves, Leslie Stapler, RN (CCM) ? ?10-06-2021 Leslie Reeves (CCM) ? ?10-02-2021 Leslie Reeves, Leslie Stapler, RN (CCM). ? ?09-02-2021 Leslie Brine, FNP. T3= 1.6. START Amlodipine 10 mg daily. Flu vaccine given. ? ?08-10-2021 Leslie Reeves (CCM) ? ?07-29-2021 Leslie Castilla, NP. RBC= 3.56, Hemo= 10.2, Hema= 32.0, RDW= 18.6. Glucose= 101, BUN= 68, Creatinine= 1.92, eGFR= 26, BUN/Creatinine= 35, Sodium= 147, Potassium= 5.7, Chloride= 114, CO2= 14, Albumin= 3.3, Albumin/Globulin= 1.0, AST= 50, ALT= 41. Magnesium= 2.4. ? ?07-13-2021 Leslie Reeves, [place]. Abnormal UA. T3= 1.1. Referral placed to physical therapy. Shingrix ordered. ? ?Recent consult visits:  ?09-29-2021 Leslie Reeves I, NP (Palliative care). No changes. ? ?08-27-2021 Leslie Reeves I, NP (Palliative care). No changes. ? ?08-24-2021 Leslie Ran, MD (Neurology). STOP multivitamin. START memantine 10 mg daily. ? ?08-13-2021 Leslie Mayo, MD (Cardiology). No changes. ? ?08-12-2021 Leslie Fire, MD (Nephrology). Unable to view encounter. ? ?07-15-2021 Leslie Mayo, MD (Cardiology). STOP vitamin D. INCREASE lasix 20 mg every other day TO 40 mg daily. ? ?06-24-2021 Leslie Desanctis, MD (Nephrology). Unable to view encounter. ? ?Reeves visits:  ?Medication Reconciliation was completed by comparing discharge summary, patient?s EMR and Pharmacy list, and upon discussion with patient. ? ?Admitted to the [facility] on 07-15-2021 due to 07-24-2021.  Discharge date was 07-24-2021. Discharged from Leslie Reeves.    ? ?New?Medications Started at Leslie Reeves Discharge:?? ?Tylenol 650 mg every 6 hours as needed ? ?Medication Changes at [facility] Discharge: ?None ? ?Medications Discontinued at [facility] Discharge: ?amLODipine 10 MG tablet (NORVASC) ?aspirin 325 MG tablet (Bayer Aspirin) ?furosemide 40 MG tablet (LASIX) ?mometasone-formoterol 100-5 MCG/ACT Aero Leslie Reeves) ?potassium chloride SA 20 MEQ tablet (KLOR-CON) ?VITAMIN B COMPLEX PO ? ?Medications that remain the same after [facility] Discharge:??  ?-All other medications will remain the same.   ? ?Medications: ?Outpatient Encounter Medications as of 12/22/2021  ?Medication Sig  ? Accu-Chek Softclix Lancets lancets Use as instructed (Patient not taking: Reported on 12/21/2021)  ? acetaminophen (TYLENOL) 325 MG tablet Take 2 tablets (650 mg total) by mouth every 6 (six) hours as needed for headache or mild pain. (Patient not taking: Reported on 12/21/2021)  ? albuterol (VENTOLIN HFA) 108 (90 Base) MCG/ACT inhaler Inhale 2 puffs into the lungs every 6 (six) hours as needed for wheezing or shortness of breath.  ? amLODipine (NORVASC) 10 MG tablet Take 1 tablet (10 mg total) by mouth daily.  ? blood glucose meter kit and supplies KIT Dispense based on patient and insurance preference. Use up to four times daily as directed. (FOR ICD-9 250.00, 250.01). (Patient not taking: Reported on 12/21/2021)  ? diclofenac sodium (VOLTAREN) 1 % GEL Apply 2 g topically 4 (four) times daily. (Patient not taking: Reported on 12/21/2021)  ? fluticasone-salmeterol (WIXELA INHUB) 100-50 MCG/ACT AEPB Inhale 1 puff into the lungs 2 (two) times daily.  ? glucose blood (ACCU-CHEK AVIVA PLUS) test strip Use as instructed (Patient not taking: Reported on 12/21/2021)  ? isosorbide-hydrALAZINE (BIDIL) 20-37.5 MG tablet Take 1 tablet by mouth 3 (three) times daily.  ? levothyroxine (SYNTHROID) 100 MCG tablet Take 1 tablet (100 mcg  total) by mouth daily.  ? memantine (NAMENDA) 10 MG tablet Take 1 tablet (10 mg total) by  mouth daily.  ? metoprolol succinate (TOPROL-XL) 25 MG 24 hr tablet Take 1 tablet (25 mg total) by mouth daily.  ? mirtazapine (REMERON) 15 MG tablet Take 2 tablets (30 mg total) by mouth at bedtime. (Patient taking differently: Take 15 mg by mouth at bedtime.)  ? rosuvastatin (CRESTOR) 20 MG tablet Take 1 tablet (20 mg total) by mouth daily.  ? ?No facility-administered encounter medications on file as of 12/22/2021.  ? ?Lab Results  ?Component Value Date/Time  ? HGBA1C 4.5 (L) 12/17/2021 11:23 AM  ? HGBA1C 5.8 (H) 06/18/2021 12:31 PM  ? MICROALBUR 150 07/13/2021 04:55 PM  ?  ? ?BP Readings from Last 3 Encounters:  ?09/02/21 138/72  ?08/24/21 (!) 183/65  ?08/13/21 (!) 150/50  ? ? ? ? ?Have you seen any other providers since your last visit with PCP? No ? ?What is your top health concern to discuss at your upcoming visit? Patient's daughter stated her blood pressure, being more active, controlling fluid issues and should patient follow through with mammogram since patient is unable to stand some days. ? ?Do you have any problems getting your medications from the pharmacy? No ? Financial barriers? No ?Access barriers? No ? ?Leslie Reeves was reminded to have all medications, supplements and any blood glucose and/or blood pressure readings available for review with Leslie Reeves, Pharm. D, at her telephone visit on 12-23-2021 at 9:00. ? ? ?Care Gaps: ?Last annual wellness visit? 04-01-2021 ?Last Colonoscopy? None ?Last Mammogram? 07-14-2020 ?Last Bone Denisty? 07-06-2021 ? ?Last eye exam / retinopathy screening? Patient's daughter stated in 2021 ?Last diabetic foot exam? None ? ?Immunizations: ?Shingrix overdue ?Covid booster overdue ?Yearly ophthalmology overdue ? ?Star Rating Drugs: ?Rosuvastatin 20 mg- Last filled 12-07-2021 100 DS [facility] ? ?Leslie Reeves CMA ?Clinical Pharmacist Assistant ?[phone] ? ?

## 2021-12-22 NOTE — Chronic Care Management (AMB) (Signed)
?  Care Management  ? ?Note ? ?12/22/2021 ?Name: Leslie Reeves MRN: [id number] DOB: [birth year] ? ?Leslie Reeves is a 81 y.o. year old female who is a primary care patient of [name], [place] and is actively engaged with the care management team. I reached out to [name] by phone today to assist with scheduling an initial visit with the Pharmacist ? ?Follow up plan: ?Telephone appointment with care management team member scheduled for:12/23/21 ? ?Laverda Sorenson  ?Care Guide, Embedded Care Coordination ?[place]  Care Management  ?Direct Dial: [phone] ? ?

## 2021-12-23 ENCOUNTER — Ambulatory Visit: Payer: Medicare Other

## 2021-12-23 DIAGNOSIS — R634 Abnormal weight loss: Secondary | ICD-10-CM

## 2021-12-23 DIAGNOSIS — I10 Essential (primary) hypertension: Secondary | ICD-10-CM

## 2021-12-23 NOTE — Progress Notes (Signed)
? ?Chronic Care Management ?Pharmacy Note ? ?12/25/2021 ?Name:  Leslie Reeves      MRN:  [id number]       DOB:  1940-11-01 ? ?Summary: ?Patient reports that she is doing well  ? ?Recommendations/Changes made from today's visit: ?Write down in a log book ?Cut back on the bacon to every now and again two to three days per week  ?Checking Leslie BP at the same time each day  ? ?Plan: ?I will collabortate with PCP team in regards to patients BP medication.  ? ? ?Subjective: ?Leslie Reeves is an 81 y.o. year old female who is a primary patient of [name], [place].  The CCM team was consulted for assistance with disease management and care coordination needs.   ? ?Engaged with patient by telephone for initial visit in response to provider referral for pharmacy case management and/or care coordination services. Leslie Reeves takes care of Leslie Reeves she is Leslie daughter. Leslie Reeves is 78 and will be 81 may 3rd. She was married but is now separated. Leslie Reeves and Leslie husband moved Leslie Reeves in with them. The last place that she worked was [employer] - she worked there until she retired and then after that she helped Leslie out with Leslie children. She has been a smoker for years since she was little, for 50 years. She has since quit. She was an active person doing things with Leslie family, but she noticed that there were some changes with Leslie memory. She would notice that sometimes Leslie moms memory was changing. Last year early spring she started to have episodes of not feeling good, and in June or May she would feel bad. In May last year they went to [place] for a wedding and since then she noticed that she wasn't feel good. She noticed that Leslie Reeves was not Leslie normal self, around last year in June. Leslie brother passed in January and then Leslie sisters husband passed, and then in April Leslie sisters son committed suicide. She was in bed and not feeling well. They went to their hometown to visit and things just started to get worse. She was trying to  help Leslie Reeves rest and get herself together. They found out that she had congested heart failure, and she was still exhausted and out of breath. She was in physical therapy and occupational therapy twice per week. She prefers sleeping, and is always tired and sleepy. She was going to physical therapy so right now she stays in bed a lot. She has dinner or something to eat. She gets fluid in Leslie feet so she keeps them elevated. She also noticed that she is gaining some weight. She sleeps a lot. She asks for soda and things but every now and again she eatings things she likes. She sees the palliative nurse come out every other month  ? ?Consent to Services:  ?The patient was given the following information about Chronic Care Management services today, agreed to services, and gave verbal consent: 1. CCM service includes personalized support from designated clinical staff supervised by the primary care provider, including individualized plan of care and coordination with other care providers 2. 24/7 contact phone numbers for assistance for urgent and routine care needs. 3. Service will only be billed when office clinical staff spend 20 minutes or more in a month to coordinate care. 4. Only one practitioner may furnish and bill the service in a calendar month. 5.The patient may stop CCM services at any  time (effective at the end of the month) by phone call to the office staff. 6. The patient will be responsible for cost sharing (co-pay) of up to 20% of the service fee (after annual deductible is met). Patient agreed to services and consent obtained. ? ?Patient Care Team: ?[name], FNP as PCP - General (General Practice) ?[name], MD as PCP - Cardiology (Cardiology) ?[name], [name], RN as [place] Management ?[name], RPH (Pharmacist) ? ?Recent office visits:  ?12-22-2021 [name], [name], RN (CCM) ?  ?12-17-2021 [name], [place]. A1C= 4.5. T3= 1.1. Referral placed to  ophthalmology. STOP Megace. TDAP given. ?  ?10-06-2021 [name], [name], RN (CCM) ?  ?10-06-2021 [name] (CCM) ?  ?10-02-2021 [name], [name], RN (CCM). ?  ?09-02-2021 [name], FNP. T3= 1.6. START Amlodipine 10 mg daily. Flu vaccine given. ?  ?08-10-2021 [name] (CCM) ?  ?07-29-2021 [name], NP. RBC= 3.56, Hemo= 10.2, Hema= 32.0, RDW= 18.6. Glucose= 101, BUN= 68, Creatinine= 1.92, eGFR= 26, BUN/Creatinine= 35, Sodium= 147, Potassium= 5.7, Chloride= 114, CO2= 14, Albumin= 3.3, Albumin/Globulin= 1.0, AST= 50, ALT= 41. Magnesium= 2.4. ?  ?07-13-2021 [name], [place]. Abnormal UA. T3= 1.1. Referral placed to physical therapy. Shingrix ordered. ?  ?Recent consult visits:  ?09-29-2021 [name], NP (Palliative care). No changes. ?  ?08-27-2021 [name], NP (Palliative care). No changes. ?  ?08-24-2021 [name], MD (Neurology). STOP multivitamin. START memantine 10 mg daily. ?  ?08-13-2021 [name], MD (Cardiology). No changes. ?  ?08-12-2021 [name], MD (Nephrology). Unable to view encounter. ?  ?07-15-2021 [name], MD (Cardiology). STOP vitamin D. INCREASE lasix 20 mg every other day TO 40 mg daily. ?  ?06-24-2021 [name], MD (Nephrology). Unable to view encounter. ?  ?Hospital visits:  ?Medication Reconciliation was completed by comparing discharge summary, patient?s EMR and Pharmacy list, and upon discussion with patient. ?  ?Admitted to the hospital on 07-15-2021 due to Dyspnea, Fall, Edema.  Discharge date was 07-24-2021. Discharged from [facility].   ?  ?New?Medications Started at [facility] Discharge:?? ?Tylenol 650 mg every 6 hours as needed ?  ?Medication Changes at Hospital Discharge: ?None ?  ?Medications Discontinued at Hospital Discharge: ?amLODipine 10 MG tablet (NORVASC) ?aspirin 325 MG tablet (Bayer Aspirin) ?furosemide 40 MG tablet (LASIX) ?mometasone-formoterol 100-5 MCG/ACT Aero [facility]) ?potassium chloride SA  20 MEQ tablet (KLOR-CON) ?VITAMIN B COMPLEX PO ?  ?Medications that remain the same after Hospital Discharge:??  ?-All other medications will remain the same.   ? ? ?Objective: ? ?Lab Results  ?Component Value Date  ? CREATININE 1.5 (A) 10/15/2021  ? BUN 46 (A) 10/15/2021  ? EGFR 26 (L) 07/29/2021  ? GFRNONAA 20 (L) 07/24/2021  ? GFRAA 54 (L) 06/26/2020  ? NA 145 10/15/2021  ? K 4.0 10/15/2021  ? CALCIUM 9.5 10/15/2021  ? CO2 33 (A) 10/15/2021  ? GLUCOSE 101 (H) 07/29/2021  ? ? ?Lab Results  ?Component Value Date/Time  ? HGBA1C 4.5 (L) 12/17/2021 11:23 AM  ? HGBA1C 5.8 (H) 06/18/2021 12:31 PM  ? MICROALBUR 150 07/13/2021 04:55 PM  ?  ?Last diabetic Eye exam:  ?Lab Results  ?Component Value Date/Time  ? HMDIABEYEEXA Retinopathy (A) 06/09/2020 12:00 AM  ?  ?Last diabetic Foot exam: No results found for: HMDIABFOOTEX  ? ?Lab Results  ?Component Value Date  ? CHOL 158 11/21/2019  ? HDL 78 11/21/2019  ? [place] 66 11/21/2019  ?  TRIG 70 11/21/2019  ? CHOLHDL 2.0 11/21/2019  ? ? ? ?  Latest Ref Rng & Units 10/15/2021  ? 12:00 AM 07/29/2021  ? 12:27 PM 07/24/2021  ?  1:18 AM  ?Hepatic Function  ?Total Protein 6.0 - 8.5 g/dL  6.5     ?Albumin 3.5 - 5.0 3.8   3.3   2.4    ?AST 0 - 40 IU/L  50     ?ALT 0 - 32 IU/L  41     ?Alk Phosphatase 44 - 121 IU/L  51     ?Total Bilirubin 0.0 - 1.2 mg/dL  0.4     ? ? ?Lab Results  ?Component Value Date/Time  ? TSH 2.000 12/17/2021 11:23 AM  ? TSH 2.140 09/02/2021 09:59 AM  ? ? ? ?  Latest Ref Rng & Units 10/15/2021  ? 12:00 AM 07/29/2021  ? 12:27 PM 07/24/2021  ?  1:18 AM  ?CBC  ?WBC 3.4 - 10.8 x10E3/uL  8.4     ?Hemoglobin 12.0 - 16.0 8.8   10.2   8.2    ?Hematocrit 34.0 - 46.6 %  32.0   25.3    ?Platelets 150 - 450 x10E3/uL  444     ? ? ?No results found for: VD25OH ? ?Clinical ASCVD: Yes  ?The ASCVD Risk score (Arnett DK, et al., 2019) failed to calculate for the following reasons: ?  The 2019 ASCVD risk score is only valid for ages 53 to 48   ? ? ?  04/01/2021  ? 10:51 AM 03/25/2021  ?  9:24  AM 03/26/2020  ? 10:21 AM  ?Depression screen PHQ 2/9  ?Decreased Interest 0 0 0  ?Down, Depressed, Hopeless 0 0 0  ?PHQ - 2 Score 0 0 0  ?  ? ? ?Social History  ? ?Tobacco Use  ?Smoking Status Former  ? Pack

## 2021-12-24 ENCOUNTER — Other Ambulatory Visit: Payer: Self-pay | Admitting: Nurse Practitioner

## 2021-12-24 MED ORDER — LEVOTHYROXINE SODIUM 112 MCG PO TABS: 112.0000 ug | ORAL_TABLET | Freq: Every day | ORAL | 2 refills | Status: DC

## 2021-12-25 ENCOUNTER — Other Ambulatory Visit: Payer: Self-pay

## 2021-12-25 DIAGNOSIS — E039 Hypothyroidism, unspecified: Secondary | ICD-10-CM

## 2021-12-25 DIAGNOSIS — I5033 Acute on chronic diastolic (congestive) heart failure: Secondary | ICD-10-CM

## 2021-12-25 DIAGNOSIS — E1169 Type 2 diabetes mellitus with other specified complication: Secondary | ICD-10-CM

## 2021-12-25 DIAGNOSIS — I10 Essential (primary) hypertension: Secondary | ICD-10-CM | POA: Diagnosis not present

## 2021-12-25 DIAGNOSIS — E785 Hyperlipidemia, unspecified: Secondary | ICD-10-CM

## 2021-12-25 MED ORDER — LEVOTHYROXINE SODIUM 112 MCG PO TABS: 112.0000 ug | ORAL_TABLET | Freq: Every day | ORAL | 2 refills | Status: DC

## 2021-12-25 NOTE — Patient Instructions (Signed)
Visit Information ?It was great speaking with you today!  Please let me know if you have any questions about our visit. ? ? Goals Addressed   ? ?  ?  ?  ?  ? This Visit's Progress  ?  Manage My Medicine     ?  Timeframe:  Long-Range Goal ?Priority:  High ?Start Date:                             ?Expected End Date:                      ? ?Follow Up Date 05/14/2022 ?  ? ?In Progress: ?- call for medicine refill 2 or 3 days before it runs out ?- call if I am sick and can't take my medicine ?- keep a list of all the medicines I take; vitamins and herbals too ?- use a pillbox to sort medicine ?- use an alarm clock or phone to remind me to take my medicine  ?  ?Why is this important?   ?These steps will help you keep on track with your medicines. ?  ? ?  ? ?  ? ?Patient Care Plan: Leslie Reeves  ?  ? ?Problem Identified: HTN, Decreased Appetite, Iron Deficiency Anemia, Memory Impairment   ?  ? ?Long-Range Goal: Disease Management   ?Note:   ? ?C ?Current Barriers:  ?Unable to achieve control of hypertension   ? ?Pharmacist Clinical Goal(s):  ?Patient will achieve adherence to monitoring guidelines and medication adherence to achieve therapeutic efficacy through collaboration with PharmD and provider.  ? ?Interventions: ?1:1 collaboration with [name], FNP regarding development and update of comprehensive plan of care as evidenced by provider attestation and co-signature ?Inter-disciplinary care team collaboration (see longitudinal plan of care) ?Comprehensive medication review performed; medication list updated in electronic medical record ? ?Hypertension (BP goal <130/80) ?-Not ideally controlled ?-Current treatment: ?Isorbide -Hydralazine 20 mg-37.5 mg: Take 1 tablet by mouth three times daily Appropriate, Effective, Safe, Accessible ?She ran out of BIDIL a couple of weeks ago  ?Metoprolol Succinate 25 mg tablet once per day Appropriate, Effective, Safe, Accessible ?Amlodipine 10 mg tablet by mouth daily  Appropriate, Effective, Safe, Accessible ?Amlodipine 5 mg tablet twice per day - patient has a prescription for both 09/18/21 - Last filled, [facility]  ?-Current home readings: she is checking her mothers BP at home some times she writes it down. Current readings 12/21/2021 - 168/69 pulse: 61 , 140/59 pulse: <60,   138/62, 155/71 , and less than 60,  she reports that it always fluctuates between the 160's - 130's, she checks her BP when she is sitting down in the bed, she just bought the BP cuff a few months ago. She is using the one that goes on the upper arm  ?-Current dietary habits: real bacon and sausage, she does not like oatmeal - sometimes eat it twice or three times per week ?-Current exercise habits: will discuss further ?-Denies hypotensive/hypertensive symptoms ?-Educated on Daily salt intake goal < 2300 mg; ?-Counseled to monitor BP at home at least three times per week, document, and provide log at future appointments ?-Recommended to continue current medication ? ?Decreased Appetite (Goal: increase appetite) ?-Controlled ?-Current treatment  ?Mirtazipine 30 mg tablet once per day Appropriate, Query effective,  ?She started taking a 1/2 tablet  ?-Recommend to continue current medication regimen, will discuss preference towards Megace ? ? ?  Query Iron Deficiency Anemia  ?-Uncontrolled ?-Current treatment  ?Ferrous sulfate 325 mg - taking 1 tablet by mouth daily Query Appropriate ?-Collaborated with PCP team to help patient with iron ? ? ?Memory Impairment (Goal: help with memory loss) ?-Controlled ?-Current treatment  ?Namenda 10 mg - taking a tablet once per day ?-[name] would like me to reach out  to the neurologist to collaborate in regards to her medication ?-Recommended to continue current medication ? ? ?Patient Goals/Self-Care Activities ?Patient will:  ?- take medications as prescribed as evidenced by patient report and record review ? ?Follow Up Plan: The patient has been provided with  contact information for the care management team and has been advised to call with any health related questions or concerns.  ?  ? ? ?[name] was given information about Chronic Care Management services today including:  ?CCM service includes personalized support from designated clinical staff supervised by her physician, including individualized plan of care and coordination with other care providers ?24/7 contact phone numbers for assistance for urgent and routine care needs. ?Standard insurance, coinsurance, copays and deductibles apply for chronic care management only during months in which we provide at least 20 minutes of these services. Most insurances cover these services at 100%, however patients may be responsible for any copay, coinsurance and/or deductible if applicable. This service may help you avoid the need for more expensive face-to-face services. ?Only one practitioner may furnish and bill the service in a calendar month. ?The patient may stop CCM services at any time (effective at the end of the month) by phone call to the office staff. ? ?Patient agreed to services and verbal consent obtained.  ? ?The patient verbalized understanding of instructions, educational materials, and care plan provided today and agreed to receive a mailed copy of patient instructions, educational materials, and care plan.  ? ?[name], PharmD ?Clinical Pharmacist Practitioner  ?[facility] ?[phone] ?  ?

## 2021-12-26 DIAGNOSIS — E43 Unspecified severe protein-calorie malnutrition: Secondary | ICD-10-CM | POA: Diagnosis not present

## 2021-12-28 ENCOUNTER — Other Ambulatory Visit: Payer: Self-pay

## 2021-12-29 ENCOUNTER — Other Ambulatory Visit: Payer: Self-pay | Admitting: Nurse Practitioner

## 2021-12-29 MED ORDER — FERROUS SULFATE 325 (65 FE) MG PO TBEC: 325.0000 mg | DELAYED_RELEASE_TABLET | Freq: Every day | ORAL | 1 refills | Status: DC

## 2022-01-03 ENCOUNTER — Other Ambulatory Visit: Payer: Self-pay | Admitting: Nurse Practitioner

## 2022-01-04 ENCOUNTER — Telehealth: Payer: Self-pay

## 2022-01-04 NOTE — Chronic Care Management (AMB) (Signed)
? ? ?  Chronic Care Management ?Pharmacy Assistant  ? ?Name: Leslie Reeves  MRN: [id number] DOB: 1941-03-14 ? ?Reason for Encounter: Onboard to [facility] ?  ?Medications: ?Outpatient Encounter Medications as of 01/04/2022  ?Medication Sig  ? Accu-Chek Softclix Lancets lancets Use as instructed (Patient not taking: Reported on 12/21/2021)  ? acetaminophen (TYLENOL) 325 MG tablet Take 2 tablets (650 mg total) by mouth every 6 (six) hours as needed for headache or mild pain. (Patient not taking: Reported on 12/21/2021)  ? albuterol (VENTOLIN HFA) 108 (90 Base) MCG/ACT inhaler Inhale 2 puffs into the lungs every 6 (six) hours as needed for wheezing or shortness of breath.  ? amLODipine (NORVASC) 10 MG tablet Take 1 tablet (10 mg total) by mouth daily.  ? blood glucose meter kit and supplies KIT Dispense based on patient and insurance preference. Use up to four times daily as directed. (FOR ICD-9 250.00, 250.01). (Patient not taking: Reported on 12/21/2021)  ? diclofenac sodium (VOLTAREN) 1 % GEL Apply 2 g topically 4 (four) times daily. (Patient not taking: Reported on 12/21/2021)  ? ferrous sulfate 325 (65 FE) MG EC tablet Take 1 tablet (325 mg total) by mouth daily with breakfast.  ? fluticasone-salmeterol (WIXELA INHUB) 100-50 MCG/ACT AEPB Inhale 1 puff into the lungs 2 (two) times daily.  ? glucose blood (ACCU-CHEK AVIVA PLUS) test strip Use as instructed (Patient not taking: Reported on 12/21/2021)  ? isosorbide-hydrALAZINE (BIDIL) 20-37.5 MG tablet Take 1 tablet by mouth 3 (three) times daily.  ? levothyroxine (SYNTHROID) 112 MCG tablet Take 1 tablet (112 mcg total) by mouth daily.  ? memantine (NAMENDA) 10 MG tablet Take 1 tablet (10 mg total) by mouth daily.  ? metoprolol succinate (TOPROL-XL) 25 MG 24 hr tablet Take 1 tablet (25 mg total) by mouth daily.  ? mirtazapine (REMERON) 15 MG tablet Take 2 tablets (30 mg total) by mouth at bedtime. (Patient taking differently: Take 15 mg by mouth at bedtime.)  ? rosuvastatin  (CRESTOR) 20 MG tablet Take 1 tablet (20 mg total) by mouth daily.  ? ?No facility-administered encounter medications on file as of 01/04/2022.  ? ?01-04-2022: Started onboard to [facility]. Completed form and cost analysis and it is cheaper to stay with mail order. ? ?01-12-2022: Informed patient's daughter and she voiced understanding. ? ?Jeannette How CMA ?Clinical Pharmacist Assistant ?[phone] ? ?

## 2022-01-07 DIAGNOSIS — N184 Chronic kidney disease, stage 4 (severe): Secondary | ICD-10-CM | POA: Diagnosis not present

## 2022-01-07 DIAGNOSIS — N189 Chronic kidney disease, unspecified: Secondary | ICD-10-CM | POA: Diagnosis not present

## 2022-01-09 ENCOUNTER — Other Ambulatory Visit: Payer: Self-pay | Admitting: Neurology

## 2022-01-09 ENCOUNTER — Other Ambulatory Visit: Payer: Self-pay | Admitting: Nurse Practitioner

## 2022-01-12 ENCOUNTER — Other Ambulatory Visit: Payer: Self-pay | Admitting: *Deleted

## 2022-01-12 DIAGNOSIS — I129 Hypertensive chronic kidney disease with stage 1 through stage 4 chronic kidney disease, or unspecified chronic kidney disease: Secondary | ICD-10-CM | POA: Diagnosis not present

## 2022-01-12 DIAGNOSIS — I5032 Chronic diastolic (congestive) heart failure: Secondary | ICD-10-CM | POA: Diagnosis not present

## 2022-01-12 DIAGNOSIS — E1122 Type 2 diabetes mellitus with diabetic chronic kidney disease: Secondary | ICD-10-CM | POA: Diagnosis not present

## 2022-01-12 DIAGNOSIS — R809 Proteinuria, unspecified: Secondary | ICD-10-CM | POA: Diagnosis not present

## 2022-01-12 DIAGNOSIS — N184 Chronic kidney disease, stage 4 (severe): Secondary | ICD-10-CM | POA: Diagnosis not present

## 2022-01-12 DIAGNOSIS — E673 Hypervitaminosis D: Secondary | ICD-10-CM | POA: Diagnosis not present

## 2022-01-12 MED ORDER — ALBUTEROL SULFATE HFA 108 (90 BASE) MCG/ACT IN AERS: 2.0000 | INHALATION_SPRAY | Freq: Four times a day (QID) | RESPIRATORY_TRACT | 3 refills | Status: AC | PRN

## 2022-01-15 ENCOUNTER — Telehealth: Payer: Self-pay | Admitting: Pulmonary Disease

## 2022-01-15 NOTE — Telephone Encounter (Signed)
Spoke with daughter (per DPR) and confirmed CT appointment and scheduled f/u OV with Dr. Valeta Harms to review results. CT is is 01/27/22 and OV is on 02/01/22. Nothing further needed at this time.  ?

## 2022-01-25 ENCOUNTER — Other Ambulatory Visit: Payer: Medicare Other

## 2022-01-25 ENCOUNTER — Other Ambulatory Visit: Payer: Self-pay | Admitting: Nurse Practitioner

## 2022-01-25 DIAGNOSIS — E43 Unspecified severe protein-calorie malnutrition: Secondary | ICD-10-CM | POA: Diagnosis not present

## 2022-01-25 DIAGNOSIS — E039 Hypothyroidism, unspecified: Secondary | ICD-10-CM

## 2022-01-25 DIAGNOSIS — N184 Chronic kidney disease, stage 4 (severe): Secondary | ICD-10-CM | POA: Diagnosis not present

## 2022-01-26 LAB — TSH: TSH: 2.13 u[IU]/mL (ref 0.450–4.500)

## 2022-01-26 LAB — T3: T3, Total: 42 ng/dL — ABNORMAL LOW (ref 71–180)

## 2022-01-27 ENCOUNTER — Ambulatory Visit
Admission: RE | Admit: 2022-01-27 | Discharge: 2022-01-27 | Disposition: A | Discharge status: Home / Self Care | Payer: Medicare Other | Source: Ambulatory Visit | Attending: Pulmonary Disease | Admitting: Pulmonary Disease

## 2022-01-27 DIAGNOSIS — I700 Atherosclerosis of aorta: Secondary | ICD-10-CM | POA: Diagnosis not present

## 2022-01-27 DIAGNOSIS — J90 Pleural effusion, not elsewhere classified: Secondary | ICD-10-CM | POA: Diagnosis not present

## 2022-01-27 DIAGNOSIS — R911 Solitary pulmonary nodule: Secondary | ICD-10-CM

## 2022-01-27 DIAGNOSIS — J439 Emphysema, unspecified: Secondary | ICD-10-CM | POA: Diagnosis not present

## 2022-01-27 DIAGNOSIS — J9811 Atelectasis: Secondary | ICD-10-CM | POA: Diagnosis not present

## 2022-02-01 ENCOUNTER — Ambulatory Visit (INDEPENDENT_AMBULATORY_CARE_PROVIDER_SITE_OTHER): Payer: Medicare Other | Admitting: Pulmonary Disease

## 2022-02-01 ENCOUNTER — Telehealth: Payer: Self-pay

## 2022-02-01 ENCOUNTER — Encounter: Payer: Self-pay | Admitting: Pulmonary Disease

## 2022-02-01 VITALS — BP 142/64 | HR 63 | Temp 97.6°F | Ht 61.0 in | Wt 111.2 lb

## 2022-02-01 DIAGNOSIS — J90 Pleural effusion, not elsewhere classified: Secondary | ICD-10-CM | POA: Diagnosis not present

## 2022-02-01 DIAGNOSIS — J4520 Mild intermittent asthma, uncomplicated: Secondary | ICD-10-CM | POA: Diagnosis not present

## 2022-02-01 DIAGNOSIS — Z87891 Personal history of nicotine dependence: Secondary | ICD-10-CM | POA: Diagnosis not present

## 2022-02-01 DIAGNOSIS — R64 Cachexia: Secondary | ICD-10-CM

## 2022-02-01 DIAGNOSIS — J432 Centrilobular emphysema: Secondary | ICD-10-CM

## 2022-02-01 DIAGNOSIS — R911 Solitary pulmonary nodule: Secondary | ICD-10-CM

## 2022-02-01 DIAGNOSIS — N184 Chronic kidney disease, stage 4 (severe): Secondary | ICD-10-CM | POA: Diagnosis not present

## 2022-02-01 NOTE — Progress Notes (Signed)
? ?Synopsis: Referred in April 2021 for lung nodule by [name], FNP ? ?Subjective:  ? ?PATIENT ID: Leslie Reeves GENDER: female DOB: March 31, 1941, MRN: [id number] ? ?Chief Complaint  ?Patient presents with  ? Follow-up  ?  SOB and exhausted on minor activity.  ? ? ?This is a 81 year old female past medical history of diabetes and hypertension.  Referred for evaluation of lung nodule.  Patient underwent CT chest on 12/18/2019 which revealed a 3 mm noncalcified right middle lobe pulmonary nodule as well as aortic atherosclerosis and a thyroid goiter.  She is a former smoker, smoked for approximately 15 years only half pack per day.  Patient has no respiratory complaints today.  We reviewed her CT imaging today in the office.  She does state that she has had an asthma diagnosis for the past 20 years.  She uses as needed albuterol.  She has done well with this with no other symptoms.  She states that she has not used her albuterol this past month.  But on occasion she does have some issues and starting to become more apparent with the season change into springtime with more pollen.. ? ?OV 03/12/2021: shes on megace to help stimulate her appetitie. Currently using wixela. Breathing ok.  Feels short of breath with exertion.  Biggest issue at this moment seems to be ongoing weight loss.  She has an appointment with nutritionist soon.  CT scan of the chest was completed in April of this past year that had follow-up regarding nodule.  This is stable. ? ?OV 02/01/2022: Here today for follow-up after recent CT chest.  CT chest revealed stable right middle lobe pulmonary nodule.  No additional follow-up needed.  Enlarged thyroid also stable.  Small pleural effusion.  These are noted bilaterally.  From respiratory standpoint doing okay.  Unfortunately has been very short of breath with any exertion lays around most of the time.  Here today in a wheelchair.  She has been losing weight steadily.  Chest x-ray back in September shows  left-sided effusion.  Repeat CT chest also shows larger left-sided effusion. ? ? ?Past Medical History:  ?Diagnosis Date  ? Allergy   ? Diabetes mellitus without complication ([place])   ? Hypertension   ?  ? ?Family History  ?Problem Relation Age of Onset  ? Heart disease Other   ? Stroke Other   ? Hypertension Other   ? COPD Other   ? Cancer Other   ? Asthma Other   ? Breast cancer Neg Hx   ?  ? ?Past Surgical History:  ?Procedure Laterality Date  ? ABDOMINAL HYSTERECTOMY    ? ? ?Social History  ? ?Socioeconomic History  ? Marital status: Married  ?  Spouse name: Not on file  ? Number of children: 2  ? Years of education: Not on file  ? Highest education level: High school graduate  ?Occupational History  ? Occupation: retired  ?Tobacco Use  ? Smoking status: Former  ?  Packs/day: 0.50  ?  Years: 50.00  ?  Pack years: 25.00  ?  Types: Cigarettes  ?  Quit date: 2017  ?  Years since quitting: 6.3  ? Smokeless tobacco: Never  ?Vaping Use  ? Vaping Use: Never used  ?Substance and Sexual Activity  ? Alcohol use: Not Currently  ? Drug use: Not Currently  ? Sexual activity: Not Currently  ?Other Topics Concern  ? Not on file  ?Social History Narrative  ? Not on file  ? ?  Social Determinants of Health  ? ?Financial Resource Strain: Low Risk   ? Difficulty of Paying Living Expenses: Not hard at all  ?Food Insecurity: No Food Insecurity  ? Worried About Running Out of Food in the Last Year: Never true  ? Ran Out of Food in the Last Year: Never true  ?Transportation Needs: No Transportation Needs  ? Lack of Transportation (Medical): No  ? Lack of Transportation (Non-Medical): No  ?Physical Activity: Inactive  ? Days of Exercise per Week: 0 days  ? Minutes of Exercise per Session: 0 min  ?Stress: No Stress Concern Present  ? Feeling of Stress : Not at all  ?Social Connections: Not on file  ?Intimate Partner Violence: Not on file  ?  ? ?Allergies  ?Allergen Reactions  ? Penicillin G Rash  ? Quinapril Other (See Comments)  ?  Penicillins Rash  ?  ? ?Outpatient Medications Prior to Visit  ?Medication Sig Dispense Refill  ? Accu-Chek Softclix Lancets lancets Use as instructed 100 each 12  ? acetaminophen (TYLENOL) 325 MG tablet Take 2 tablets (650 mg total) by mouth every 6 (six) hours as needed for headache or mild pain.    ? albuterol (PROVENTIL HFA) 108 (90 Base) MCG/ACT inhaler Inhale 2 puffs into the lungs every 6 (six) hours as needed for wheezing or shortness of breath. 24 g 3  ? amLODipine (NORVASC) 10 MG tablet Take 1 tablet (10 mg total) by mouth daily. 30 tablet 11  ? blood glucose meter kit and supplies KIT Dispense based on patient and insurance preference. Use up to four times daily as directed. (FOR ICD-9 250.00, 250.01). 1 each 0  ? diclofenac sodium (VOLTAREN) 1 % GEL Apply 2 g topically 4 (four) times daily. 100 g 1  ? fluticasone-salmeterol (WIXELA INHUB) 100-50 MCG/ACT AEPB Inhale 1 puff into the lungs 2 (two) times daily. 180 each 3  ? glucose blood (ACCU-CHEK AVIVA PLUS) test strip Use as instructed 300 each 3  ? isosorbide-hydrALAZINE (BIDIL) 20-37.5 MG tablet Take 1 tablet by mouth 3 (three) times daily. 30 tablet 0  ? levothyroxine (SYNTHROID) 112 MCG tablet Take 1 tablet (112 mcg total) by mouth daily. 30 tablet 2  ? losartan (COZAAR) 25 MG tablet Take 25 mg by mouth daily.    ? memantine (NAMENDA) 10 MG tablet Take 1 tablet (10 mg total) by mouth daily. 90 tablet 3  ? mirtazapine (REMERON) 15 MG tablet Take 2 tablets (30 mg total) by mouth at bedtime. (Patient taking differently: Take 15 mg by mouth at bedtime.) 30 tablet 2  ? rosuvastatin (CRESTOR) 20 MG tablet TAKE 1 TABLET BY MOUTH EVERY DAY 90 tablet 1  ? metoprolol succinate (TOPROL-XL) 25 MG 24 hr tablet Take 1 tablet (25 mg total) by mouth daily. 30 tablet 2  ? ferrous sulfate 325 (65 FE) MG EC tablet Take 1 tablet (325 mg total) by mouth daily with breakfast. 90 tablet 1  ? ?No facility-administered medications prior to visit.  ? ? ?Review of Systems   ?Constitutional:  Positive for malaise/fatigue and weight loss. Negative for chills and fever.  ?HENT:  Negative for hearing loss, sore throat and tinnitus.   ?Eyes:  Negative for blurred vision and double vision.  ?Respiratory:  Negative for cough, hemoptysis, sputum production, shortness of breath, wheezing and stridor.   ?Cardiovascular:  Negative for chest pain, palpitations, orthopnea, leg swelling and PND.  ?Gastrointestinal:  Negative for abdominal pain, constipation, diarrhea, heartburn, nausea and vomiting.  ?Genitourinary:    Negative for dysuria, hematuria and urgency.  ?Musculoskeletal:  Negative for joint pain and myalgias.  ?Skin:  Negative for itching and rash.  ?Neurological:  Negative for dizziness, tingling, weakness and headaches.  ?Endo/Heme/Allergies:  Negative for environmental allergies. Does not bruise/bleed easily.  ?Psychiatric/Behavioral:  Negative for depression. The patient is not nervous/anxious and does not have insomnia.   ?All other systems reviewed and are negative. ? ? ?Objective:  ?Physical Exam ?Vitals reviewed.  ?Constitutional:   ?   General: She is not in acute distress. ?   Appearance: She is well-developed.  ?   Comments: Thin cachectic  ?HENT:  ?   Head: Normocephalic and atraumatic.  ?Eyes:  ?   General: No scleral icterus. ?   Conjunctiva/sclera: Conjunctivae normal.  ?   Pupils: Pupils are equal, round, and reactive to light.  ?Neck:  ?   Vascular: No JVD.  ?   Trachea: No tracheal deviation.  ?Cardiovascular:  ?   Rate and Rhythm: Normal rate and regular rhythm.  ?   Heart sounds: Normal heart sounds. No murmur heard. ?Pulmonary:  ?   Effort: Pulmonary effort is normal. No tachypnea, accessory muscle usage or respiratory distress.  ?   Breath sounds: No stridor. No wheezing, rhonchi or rales.  ?   Comments: Diminished breath sounds in the left base compared to the right ?Abdominal:  ?   General: There is no distension.  ?   Palpations: Abdomen is soft.  ?   Tenderness:  There is no abdominal tenderness.  ?Musculoskeletal:     ?   General: No tenderness.  ?   Cervical back: Neck supple.  ?Lymphadenopathy:  ?   Cervical: No cervical adenopathy.  ?Skin: ?   General: Skin is warm a

## 2022-02-01 NOTE — H&P (View-Only) (Signed)
? ?Synopsis: Referred in April 2021 for lung nodule by [name], FNP ? ?Subjective:  ? ?PATIENT ID: Leslie Reeves GENDER: female DOB: March 31, 1941, MRN: [id number] ? ?Chief Complaint  ?Patient presents with  ? Follow-up  ?  SOB and exhausted on minor activity.  ? ? ?This is a 81 year old female past medical history of diabetes and hypertension.  Referred for evaluation of lung nodule.  Patient underwent CT chest on 12/18/2019 which revealed a 3 mm noncalcified right middle lobe pulmonary nodule as well as aortic atherosclerosis and a thyroid goiter.  She is a former smoker, smoked for approximately 15 years only half pack per day.  Patient has no respiratory complaints today.  We reviewed her CT imaging today in the office.  She does state that she has had an asthma diagnosis for the past 20 years.  She uses as needed albuterol.  She has done well with this with no other symptoms.  She states that she has not used her albuterol this past month.  But on occasion she does have some issues and starting to become more apparent with the season change into springtime with more pollen.. ? ?OV 03/12/2021: shes on megace to help stimulate her appetitie. Currently using wixela. Breathing ok.  Feels short of breath with exertion.  Biggest issue at this moment seems to be ongoing weight loss.  She has an appointment with nutritionist soon.  CT scan of the chest was completed in April of this past year that had follow-up regarding nodule.  This is stable. ? ?OV 02/01/2022: Here today for follow-up after recent CT chest.  CT chest revealed stable right middle lobe pulmonary nodule.  No additional follow-up needed.  Enlarged thyroid also stable.  Small pleural effusion.  These are noted bilaterally.  From respiratory standpoint doing okay.  Unfortunately has been very short of breath with any exertion lays around most of the time.  Here today in a wheelchair.  She has been losing weight steadily.  Chest x-ray back in September shows  left-sided effusion.  Repeat CT chest also shows larger left-sided effusion. ? ? ?Past Medical History:  ?Diagnosis Date  ? Allergy   ? Diabetes mellitus without complication ([place])   ? Hypertension   ?  ? ?Family History  ?Problem Relation Age of Onset  ? Heart disease Other   ? Stroke Other   ? Hypertension Other   ? COPD Other   ? Cancer Other   ? Asthma Other   ? Breast cancer Neg Hx   ?  ? ?Past Surgical History:  ?Procedure Laterality Date  ? ABDOMINAL HYSTERECTOMY    ? ? ?Social History  ? ?Socioeconomic History  ? Marital status: Married  ?  Spouse name: Not on file  ? Number of children: 2  ? Years of education: Not on file  ? Highest education level: High school graduate  ?Occupational History  ? Occupation: retired  ?Tobacco Use  ? Smoking status: Former  ?  Packs/day: 0.50  ?  Years: 50.00  ?  Pack years: 25.00  ?  Types: Cigarettes  ?  Quit date: 2017  ?  Years since quitting: 6.3  ? Smokeless tobacco: Never  ?Vaping Use  ? Vaping Use: Never used  ?Substance and Sexual Activity  ? Alcohol use: Not Currently  ? Drug use: Not Currently  ? Sexual activity: Not Currently  ?Other Topics Concern  ? Not on file  ?Social History Narrative  ? Not on file  ? ?  Social Determinants of Health  ? ?Financial Resource Strain: Low Risk   ? Difficulty of Paying Living Expenses: Not hard at all  ?Food Insecurity: No Food Insecurity  ? Worried About Charity fundraiser in the Last Year: Never true  ? Ran Out of Food in the Last Year: Never true  ?Transportation Needs: No Transportation Needs  ? Lack of Transportation (Medical): No  ? Lack of Transportation (Non-Medical): No  ?Physical Activity: Inactive  ? Days of Exercise per Week: 0 days  ? Minutes of Exercise per Session: 0 min  ?Stress: No Stress Concern Present  ? Feeling of Stress : Not at all  ?Social Connections: Not on file  ?Intimate Partner Violence: Not on file  ?  ? ?Allergies  ?Allergen Reactions  ? Penicillin G Rash  ? Quinapril Other (See Comments)  ?  Penicillins Rash  ?  ? ?Outpatient Medications Prior to Visit  ?Medication Sig Dispense Refill  ? Accu-Chek Softclix Lancets lancets Use as instructed 100 each 12  ? acetaminophen (TYLENOL) 325 MG tablet Take 2 tablets (650 mg total) by mouth every 6 (six) hours as needed for headache or mild pain.    ? albuterol (PROVENTIL HFA) 108 (90 Base) MCG/ACT inhaler Inhale 2 puffs into the lungs every 6 (six) hours as needed for wheezing or shortness of breath. 24 g 3  ? amLODipine (NORVASC) 10 MG tablet Take 1 tablet (10 mg total) by mouth daily. 30 tablet 11  ? blood glucose meter kit and supplies KIT Dispense based on patient and insurance preference. Use up to four times daily as directed. (FOR ICD-9 250.00, 250.01). 1 each 0  ? diclofenac sodium (VOLTAREN) 1 % GEL Apply 2 g topically 4 (four) times daily. 100 g 1  ? fluticasone-salmeterol (WIXELA INHUB) 100-50 MCG/ACT AEPB Inhale 1 puff into the lungs 2 (two) times daily. 180 each 3  ? glucose blood (ACCU-CHEK AVIVA PLUS) test strip Use as instructed 300 each 3  ? isosorbide-hydrALAZINE (BIDIL) 20-37.5 MG tablet Take 1 tablet by mouth 3 (three) times daily. 30 tablet 0  ? levothyroxine (SYNTHROID) 112 MCG tablet Take 1 tablet (112 mcg total) by mouth daily. 30 tablet 2  ? losartan (COZAAR) 25 MG tablet Take 25 mg by mouth daily.    ? memantine (NAMENDA) 10 MG tablet Take 1 tablet (10 mg total) by mouth daily. 90 tablet 3  ? mirtazapine (REMERON) 15 MG tablet Take 2 tablets (30 mg total) by mouth at bedtime. (Patient taking differently: Take 15 mg by mouth at bedtime.) 30 tablet 2  ? rosuvastatin (CRESTOR) 20 MG tablet TAKE 1 TABLET BY MOUTH EVERY DAY 90 tablet 1  ? metoprolol succinate (TOPROL-XL) 25 MG 24 hr tablet Take 1 tablet (25 mg total) by mouth daily. 30 tablet 2  ? ferrous sulfate 325 (65 FE) MG EC tablet Take 1 tablet (325 mg total) by mouth daily with breakfast. 90 tablet 1  ? ?No facility-administered medications prior to visit.  ? ? ?Review of Systems   ?Constitutional:  Positive for malaise/fatigue and weight loss. Negative for chills and fever.  ?HENT:  Negative for hearing loss, sore throat and tinnitus.   ?Eyes:  Negative for blurred vision and double vision.  ?Respiratory:  Negative for cough, hemoptysis, sputum production, shortness of breath, wheezing and stridor.   ?Cardiovascular:  Negative for chest pain, palpitations, orthopnea, leg swelling and PND.  ?Gastrointestinal:  Negative for abdominal pain, constipation, diarrhea, heartburn, nausea and vomiting.  ?Genitourinary:  Negative for dysuria, hematuria and urgency.  ?Musculoskeletal:  Negative for joint pain and myalgias.  ?Skin:  Negative for itching and rash.  ?Neurological:  Negative for dizziness, tingling, weakness and headaches.  ?Endo/Heme/Allergies:  Negative for environmental allergies. Does not bruise/bleed easily.  ?Psychiatric/Behavioral:  Negative for depression. The patient is not nervous/anxious and does not have insomnia.   ?All other systems reviewed and are negative. ? ? ?Objective:  ?Physical Exam ?Vitals reviewed.  ?Constitutional:   ?   General: She is not in acute distress. ?   Appearance: She is well-developed.  ?   Comments: Thin cachectic  ?HENT:  ?   Head: Normocephalic and atraumatic.  ?Eyes:  ?   General: No scleral icterus. ?   Conjunctiva/sclera: Conjunctivae normal.  ?   Pupils: Pupils are equal, round, and reactive to light.  ?Neck:  ?   Vascular: No JVD.  ?   Trachea: No tracheal deviation.  ?Cardiovascular:  ?   Rate and Rhythm: Normal rate and regular rhythm.  ?   Heart sounds: Normal heart sounds. No murmur heard. ?Pulmonary:  ?   Effort: Pulmonary effort is normal. No tachypnea, accessory muscle usage or respiratory distress.  ?   Breath sounds: No stridor. No wheezing, rhonchi or rales.  ?   Comments: Diminished breath sounds in the left base compared to the right ?Abdominal:  ?   General: There is no distension.  ?   Palpations: Abdomen is soft.  ?   Tenderness:  There is no abdominal tenderness.  ?Musculoskeletal:     ?   General: No tenderness.  ?   Cervical back: Neck supple.  ?Lymphadenopathy:  ?   Cervical: No cervical adenopathy.  ?Skin: ?   General: Skin is warm a

## 2022-02-01 NOTE — Telephone Encounter (Signed)
Pt needs to be scheduled for THORACENTESIS on  ?

## 2022-02-01 NOTE — Telephone Encounter (Signed)
02/04/2022 at 1400. Dr. Valeta Harms has already spoken with scheduling and pt has been instructed to be at [facility] at 1330 on 02/04/22.  ?

## 2022-02-01 NOTE — Patient Instructions (Addendum)
Thank you for visiting Dr. Valeta Harms at [facility]. ?Today we recommend the following: ? ?Orders Placed This Encounter  ?Procedures  ? Procedural/ Surgical Case Request: THORACENTESIS  ? Ambulatory referral to Pulmonology  ? ?Thora this week in afternoon at [facility] w/ Dr. Tamala Julian  ?1:30PM arrive at [facility], ENDOSCOPY  ? ?Return in about 2 weeks (around 02/15/2022) for with APP . ? ? ? ?Please do your part to reduce the spread of COVID-19.  ? ?

## 2022-02-01 NOTE — Chronic Care Management (AMB) (Signed)
Chronic Care Management Pharmacy Assistant   Name: Leslie Reeves  MRN: [id number] DOB: [birth year]   Reason for Encounter: Disease State/ Hypertension   Recent office visits:  None  Recent consult visits:  None  Hospital visits:  None in previous 6 months  Medications: Outpatient Encounter Medications as of 02/01/2022  Medication Sig   albuterol (PROVENTIL HFA) 108 (90 Base) MCG/ACT inhaler Inhale 2 puffs into the lungs every 6 (six) hours as needed for wheezing or shortness of breath.   Accu-Chek Softclix Lancets lancets Use as instructed (Patient not taking: Reported on 12/21/2021)   acetaminophen (TYLENOL) 325 MG tablet Take 2 tablets (650 mg total) by mouth every 6 (six) hours as needed for headache or mild pain. (Patient not taking: Reported on 12/21/2021)   amLODipine (NORVASC) 10 MG tablet Take 1 tablet (10 mg total) by mouth daily.   blood glucose meter kit and supplies KIT Dispense based on patient and insurance preference. Use up to four times daily as directed. (FOR ICD-9 250.00, 250.01). (Patient not taking: Reported on 12/21/2021)   diclofenac sodium (VOLTAREN) 1 % GEL Apply 2 g topically 4 (four) times daily. (Patient not taking: Reported on 12/21/2021)   ferrous sulfate 325 (65 FE) MG EC tablet Take 1 tablet (325 mg total) by mouth daily with breakfast.   fluticasone-salmeterol (WIXELA INHUB) 100-50 MCG/ACT AEPB Inhale 1 puff into the lungs 2 (two) times daily.   glucose blood (ACCU-CHEK AVIVA PLUS) test strip Use as instructed (Patient not taking: Reported on 12/21/2021)   isosorbide-hydrALAZINE (BIDIL) 20-37.5 MG tablet Take 1 tablet by mouth 3 (three) times daily.   levothyroxine (SYNTHROID) 112 MCG tablet Take 1 tablet (112 mcg total) by mouth daily.   memantine (NAMENDA) 10 MG tablet Take 1 tablet (10 mg total) by mouth daily.   metoprolol succinate (TOPROL-XL) 25 MG 24 hr tablet Take 1 tablet (25 mg total) by mouth daily.   mirtazapine (REMERON) 15 MG tablet Take 2  tablets (30 mg total) by mouth at bedtime. (Patient taking differently: Take 15 mg by mouth at bedtime.)   rosuvastatin (CRESTOR) 20 MG tablet TAKE 1 TABLET BY MOUTH EVERY DAY   No facility-administered encounter medications on file as of 02/01/2022.   Reviewed chart prior to disease state call.   Recent Office Vitals: BP Readings from Last 3 Encounters:  12/17/21 104/60  09/02/21 138/72  08/24/21 (!) 183/65   Pulse Readings from Last 3 Encounters:  12/17/21 (!) 58  09/02/21 71  08/24/21 66    Wt Readings from Last 3 Encounters:  09/02/21 99 lb 9.6 oz (45.2 kg)  08/24/21 97 lb 8 oz (44.2 kg)  08/13/21 94 lb 3.2 oz (42.7 kg)     Kidney Function Lab Results  Component Value Date/Time   CREATININE 1.5 (A) 10/15/2021 12:00 AM   CREATININE 1.92 (H) 07/29/2021 12:27 PM   CREATININE 2.43 (H) 07/24/2021 01:18 AM   GFRNONAA 20 (L) 07/24/2021 01:18 AM   GFRAA 54 (L) 06/26/2020 09:48 AM       Latest Ref Rng & Units 10/15/2021   12:00 AM 07/29/2021   12:27 PM 07/24/2021    1:18 AM  BMP  Glucose 70 - 99 mg/dL  101   124    BUN 4 - 21 46   68   79    Creatinine 0.5 - 1.1 1.5   1.92   2.43    BUN/Creat Ratio 12 - 28  35     Sodium 137 -  147 145   147   140    Potassium 3.4 - 5.3 4.0   5.7   4.8    Chloride 99 - 108 108   114   110    CO2 13 - 22 33   14   25    Calcium 8.7 - 10.7 9.5   9.8   9.4      02-01-2022: 1st attempt left VM 02-05-2022: 2nd attempt left VM 02-08-2022: 3rd attempt left VM  Care Gaps: Shingrix overdue Covid booster overdue AWV 04-28-2022  Star Rating Drugs: Rosuvastatin 20 mg- Last filled 12-07-2021 100 DS [place] Clinical Pharmacist Assistant [phone]

## 2022-02-04 ENCOUNTER — Encounter (HOSPITAL_COMMUNITY)
Admission: RE | Disposition: A | Discharge status: Home / Self Care | Payer: Self-pay | Source: Home / Self Care | Attending: Internal Medicine

## 2022-02-04 ENCOUNTER — Ambulatory Visit (HOSPITAL_COMMUNITY)
Admission: RE | Admit: 2022-02-04 | Discharge: 2022-02-04 | Disposition: A | Discharge status: Home / Self Care | Payer: Medicare Other | Attending: Internal Medicine | Admitting: Internal Medicine

## 2022-02-04 ENCOUNTER — Ambulatory Visit (HOSPITAL_COMMUNITY): Payer: Medicare Other

## 2022-02-04 ENCOUNTER — Encounter (HOSPITAL_COMMUNITY): Payer: Self-pay | Admitting: Internal Medicine

## 2022-02-04 DIAGNOSIS — R627 Adult failure to thrive: Secondary | ICD-10-CM

## 2022-02-04 DIAGNOSIS — Z87891 Personal history of nicotine dependence: Secondary | ICD-10-CM | POA: Insufficient documentation

## 2022-02-04 DIAGNOSIS — I517 Cardiomegaly: Secondary | ICD-10-CM | POA: Diagnosis not present

## 2022-02-04 DIAGNOSIS — R091 Pleurisy: Secondary | ICD-10-CM | POA: Diagnosis not present

## 2022-02-04 DIAGNOSIS — I700 Atherosclerosis of aorta: Secondary | ICD-10-CM | POA: Diagnosis not present

## 2022-02-04 DIAGNOSIS — J90 Pleural effusion, not elsewhere classified: Secondary | ICD-10-CM | POA: Insufficient documentation

## 2022-02-04 HISTORY — PX: THORACENTESIS: SHX235

## 2022-02-04 LAB — LACTATE DEHYDROGENASE, PLEURAL OR PERITONEAL FLUID: LD, Fluid: 50 U/L — ABNORMAL HIGH (ref 3–23)

## 2022-02-04 LAB — BODY FLUID CELL COUNT WITH DIFFERENTIAL
Eos, Fluid: 0 %
Lymphs, Fluid: 76 %
Monocyte-Macrophage-Serous Fluid: 9 % — ABNORMAL LOW (ref 50–90)
Neutrophil Count, Fluid: 15 % (ref 0–25)
Total Nucleated Cell Count, Fluid: 89 cu mm (ref 0–1000)

## 2022-02-04 LAB — PROTEIN, PLEURAL OR PERITONEAL FLUID: Total protein, fluid: <3 g/dL

## 2022-02-04 SURGERY — THORACENTESIS
Anesthesia: General | Laterality: Left

## 2022-02-04 NOTE — Op Note (Signed)
Thoracentesis  Procedure Note ? ?Leslie Reeves  ?[id number]  ?Oct 26, 1940 ? ?Date:02/04/22  ?Time:3:50 PM  ? ?Provider Performing:Nandita Mathenia C Tamala Julian  ? ?Procedure: Thoracentesis with imaging guidance (88280) ? ?Indication(s) ?Pleural Effusion ? ?Consent ?Risks of the procedure as well as the alternatives and risks of each were explained to the patient and/or caregiver.  Consent for the procedure was obtained and is signed in the bedside chart ? ?Anesthesia ?Topical only with 1% lidocaine  ? ? ?Time Out ?Verified patient identification, verified procedure, site/side was marked, verified correct patient position, special equipment/implants available, medications/allergies/relevant history reviewed, required imaging and test results available. ? ? ?Sterile Technique ?Maximal sterile technique including full sterile barrier drape, hand hygiene, sterile gown, sterile gloves, mask, hair covering, sterile ultrasound probe cover (if used). ? ?Procedure Description ?Ultrasound was used to identify appropriate pleural anatomy for placement and overlying skin marked.  Area of drainage cleaned and draped in sterile fashion. Lidocaine was used to anesthetize the skin and subcutaneous tissue.  300 cc's of clear appearing fluid was drained from the left pleural space. Catheter then removed and bandaid applied to site. ? ? ?Complications/Tolerance ?None; patient tolerated the procedure well. ?Chest X-ray is ordered to confirm no post-procedural complication. ? ? ?EBL ?Minimal ? ? ?Specimen(s) ?Pleural fluid ? ? ? ? ? ? ? ? ? ? ?

## 2022-02-04 NOTE — Interval H&P Note (Signed)
Seen for thora ?Daughter consents ?Risks/benefits described ?

## 2022-02-05 ENCOUNTER — Encounter (HOSPITAL_COMMUNITY): Payer: Self-pay | Admitting: Internal Medicine

## 2022-02-05 LAB — CYTOLOGY - NON PAP

## 2022-02-08 LAB — BODY FLUID CULTURE W GRAM STAIN: Culture: NO GROWTH

## 2022-02-14 ENCOUNTER — Other Ambulatory Visit: Payer: Self-pay | Admitting: Internal Medicine

## 2022-02-15 ENCOUNTER — Ambulatory Visit (INDEPENDENT_AMBULATORY_CARE_PROVIDER_SITE_OTHER): Payer: Medicare Other | Admitting: Adult Health

## 2022-02-15 ENCOUNTER — Ambulatory Visit (INDEPENDENT_AMBULATORY_CARE_PROVIDER_SITE_OTHER): Payer: Medicare Other

## 2022-02-15 ENCOUNTER — Encounter: Payer: Self-pay | Admitting: Adult Health

## 2022-02-15 VITALS — BP 140/60 | HR 60 | Temp 97.6°F | Ht 61.0 in | Wt 108.2 lb

## 2022-02-15 DIAGNOSIS — N1831 Chronic kidney disease, stage 3a: Secondary | ICD-10-CM

## 2022-02-15 DIAGNOSIS — J90 Pleural effusion, not elsewhere classified: Secondary | ICD-10-CM

## 2022-02-15 DIAGNOSIS — D649 Anemia, unspecified: Secondary | ICD-10-CM

## 2022-02-15 DIAGNOSIS — E43 Unspecified severe protein-calorie malnutrition: Secondary | ICD-10-CM | POA: Diagnosis not present

## 2022-02-15 DIAGNOSIS — J449 Chronic obstructive pulmonary disease, unspecified: Secondary | ICD-10-CM | POA: Diagnosis not present

## 2022-02-15 NOTE — Patient Instructions (Signed)
Continue on Wixela 1 puff Twice daily  , rinse after use  Albuterol inhaler As needed   Activity as tolerated.  Continue on Oxygen 3l/m  High protein diet.  Continue with Palliative care  Follow up with Dr. Valeta Harms in 3 months and As needed   Please contact office for sooner follow up if symptoms do not improve or worsen or seek emergency care

## 2022-02-15 NOTE — Assessment & Plan Note (Signed)
Recommend high-protein diet and supplements

## 2022-02-15 NOTE — Assessment & Plan Note (Signed)
Continue follow-up with primary care.  Has chronic anemia.

## 2022-02-15 NOTE — Assessment & Plan Note (Signed)
COPD with emphysema appears currently stable.  Continue on current regimen  Plan  Patient Instructions  Continue on Wixela 1 puff Twice daily  , rinse after use  Albuterol inhaler As needed   Activity as tolerated.  Continue on Oxygen 3l/m  High protein diet.  Continue with Palliative care  Follow up with Dr. Valeta Harms in 3 months and As needed   Please contact office for sooner follow up if symptoms do not improve or worsen or seek emergency care

## 2022-02-15 NOTE — Assessment & Plan Note (Signed)
Left greater than right pleural effusion.  Recent thoracentesis on left effusion with 300 cc removed.  Patient with slight perceived clinical benefit.  Does not have substantial symptom burden.  Cytology was negative for malignant cells.  Pleural fluid cultures were negative.  Pleural fluid analysis (w/out serum labs) does appear this may be more transudative in nature.  Unfortunately patient has chronic kidney disease, does not appear to be overtly volume overloaded.  We will hold on diuresis at this time.  Plan  Patient Instructions  Continue on Wixela 1 puff Twice daily  , rinse after use  Albuterol inhaler As needed   Activity as tolerated.  Continue on Oxygen 3l/m  High protein diet.  Continue with Palliative care  Follow up with Dr. Valeta Harms in 3 months and As needed   Please contact office for sooner follow up if symptoms do not improve or worsen or seek emergency care

## 2022-02-15 NOTE — Assessment & Plan Note (Signed)
Chronic kidney disease.  Continue follow-up with nephrology.

## 2022-02-15 NOTE — Progress Notes (Signed)
_0  ID: Leslie Reeves, female    DOB: [birth year], 81 y.o.   MRN: [id number]  Chief Complaint  Patient presents with   Follow-up    Referring provider: [name], FNP  HPI: 81 year old female former smoker followed for emphysema, asthma, lung nodule, pleural effusion and chronic respiratory failure on O2 at 3l/m (started fall 2022)  Medical history significant for chronic kidney disease, protein calorie malnutrition, anemia  TEST/EVENTS :  Pleural fluid cytology Feb 04, 2022 negative for malignant cells, positive inflammation Pleural protein <3. , LDH 50 , Cx neg .   CT chest Jan 27, 2022 stable right middle lobe nodule, small pleural effusions left greater than right with associated left basilar atelectasis 2D echo 06/2021  EF at 65 to 60%, grade 1 diastolic dysfunction, moderately elevated pulmonary artery systolic pressure  03/26/1600 Follow up : Emphysema, pleural effusion Patient returns for 2-week follow-up.  Patient was seen last visit with increased shortness of breath chest x-ray showed a left-sided pleural effusion.  Repeat CT chest showed increasing size of left pleural effusion.  Patient was set up for a ultrasound-guided thoracentesis.  This was completed on Feb 04, 2022.  300 cc were removed pleural fluid.  Cytology was negative for malignant cells.  Positive inflammation.  Pleural fluid culture was negative.  Pleural LDH was 50.  Pleural protein was less than 3. Since last visit patient is feeling okay.  Does feel that her breathing might be slightly better.  Patient is very sedentary spends most of her day in the bed.  Her daughter is her caregiver.  Palliative care is following at home..  Has previously done physical therapy and declines any further physical therapy at home.  Remains on why Excela inhaler twice daily.  Uses albuterol on occasion.  Denies any increased cough or congestion.  Does have lower extremity edema.  Previous echo Chest x-ray today shows similar  small left effusion and tiny right pleural effusion.  Appetite remains poor.  Does occasionally do Glucerna protein shake.  Allergies  Allergen Reactions   Penicillin G Rash   Quinapril Other (See Comments)    Unknown   Penicillins Rash    Immunization History  Administered Date(s) Administered   Fluad Quad(high Dose 65+) 09/02/2021   Influenza,inj,Quad PF,6+ Mos 07/06/2018   Influenza-Unspecified 06/27/2013, 06/28/2019   PFIZER(Purple Top)SARS-COV-2 Vaccination 11/17/2019, 12/10/2019, 07/21/2020, 03/14/2021   PNEUMOCOCCAL CONJUGATE-20 03/14/2021   Tdap 12/17/2021    Past Medical History:  Diagnosis Date   Allergy    Diabetes mellitus without complication ([place])    Hypertension     Tobacco History: Social History   Tobacco Use  Smoking Status Former   Packs/day: 0.50   Years: 50.00   Pack years: 25.00   Types: Cigarettes   Quit date: 2017   Years since quitting: 6.3  Smokeless Tobacco Never   Counseling given: Not Answered   Outpatient Medications Prior to Visit  Medication Sig Dispense Refill   Accu-Chek Softclix Lancets lancets Use as instructed 100 each 12   acetaminophen (TYLENOL) 325 MG tablet Take 2 tablets (650 mg total) by mouth every 6 (six) hours as needed for headache or mild pain.     albuterol (PROVENTIL HFA) 108 (90 Base) MCG/ACT inhaler Inhale 2 puffs into the lungs every 6 (six) hours as needed for wheezing or shortness of breath. 24 g 3   amLODipine (NORVASC) 10 MG tablet Take 1 tablet (10 mg total) by mouth daily. 30 tablet 11   blood glucose  meter kit and supplies KIT Dispense based on patient and insurance preference. Use up to four times daily as directed. (FOR ICD-9 250.00, 250.01). 1 each 0   diclofenac sodium (VOLTAREN) 1 % GEL Apply 2 g topically 4 (four) times daily. (Patient taking differently: Apply 2 g topically 4 (four) times daily as needed (Pain).) 100 g 1   fluticasone-salmeterol (WIXELA INHUB) 100-50 MCG/ACT AEPB Inhale 1 puff into  the lungs 2 (two) times daily. 180 each 3   glucose blood (ACCU-CHEK AVIVA PLUS) test strip Use as instructed 300 each 3   isosorbide-hydrALAZINE (BIDIL) 20-37.5 MG tablet Take 1 tablet by mouth 3 (three) times daily. 30 tablet 0   levothyroxine (SYNTHROID) 112 MCG tablet Take 1 tablet (112 mcg total) by mouth daily. 30 tablet 2   losartan (COZAAR) 25 MG tablet Take 25 mg by mouth daily.     memantine (NAMENDA) 10 MG tablet Take 1 tablet (10 mg total) by mouth daily. 90 tablet 3   mirtazapine (REMERON) 15 MG tablet Take 2 tablets (30 mg total) by mouth at bedtime. (Patient taking differently: Take 15 mg by mouth at bedtime.) 30 tablet 2   rosuvastatin (CRESTOR) 20 MG tablet TAKE 1 TABLET BY MOUTH DAILY 100 tablet 2   metoprolol succinate (TOPROL-XL) 25 MG 24 hr tablet Take 1 tablet (25 mg total) by mouth daily. 30 tablet 2   No facility-administered medications prior to visit.     Review of Systems:   Constitutional:   No  weight loss, night sweats,  Fevers, chills,  +fatigue, or  lassitude.  HEENT:   No headaches,  Difficulty swallowing,  Tooth/dental problems, or  Sore throat,                No sneezing, itching, ear ache, nasal congestion, post nasal drip,   CV:  No chest pain,  Orthopnea, PND, swelling in lower extremities, anasarca, dizziness, palpitations, syncope.   GI  No heartburn, indigestion, abdominal pain, nausea, vomiting, diarrhea, change in bowel habits, loss of appetite, bloody stools.   Resp:  No excess mucus, no productive cough,  No non-productive cough,  No coughing up of blood.  No change in color of mucus.  No wheezing.  No chest wall deformity  Skin: no rash or lesions.  GU: no dysuria, change in color of urine, no urgency or frequency.  No flank pain, no hematuria   MS:  No joint pain or swelling.  No decreased range of motion.  No back pain.    Physical Exam  BP 140/60 (BP Location: Right Arm, Patient Position: Sitting, Cuff Size: Normal)   Pulse 60    Temp 97.6 F (36.4 C) (Oral)   Ht _0  (1.549 m)   Wt 108 lb 3.2 oz (49.1 kg)   SpO2 94%   BMI 20.44 kg/m   GEN: A/Ox3; pleasant , NAD, thin cachectic on oxygen   HEENT:  [place]/AT,   NOSE-clear, THROAT-clear, no lesions, no postnasal drip or exudate noted.   NECK:  Supple w/ fair ROM; no JVD; normal carotid impulses w/o bruits; no thyromegaly or nodules palpated; no lymphadenopathy.    RESP  Clear  P & A; w/o, wheezes/ rales/ or rhonchi. no accessory muscle use, no dullness to percussion  CARD:  RRR, no m/r/g, 1+peripheral edema, pulses intact, no cyanosis or clubbing.  GI:   Soft & nt; nml bowel sounds; no organomegaly or masses detected.   Musco: Warm bil, no deformities or joint swelling noted.  Neuro: alert, no focal deficits noted.    Skin: Warm, no lesions or rashes    Lab Results:  CBC    Component Value Date/Time   WBC 8.4 07/29/2021 1227   WBC 10.1 07/23/2021 0621   RBC 3.56 (L) 07/29/2021 1227   RBC 2.93 (L) 07/23/2021 0621   HGB 8.8 (A) 10/15/2021 0000   HGB 10.2 (L) 07/29/2021 1227   HCT 32.0 (L) 07/29/2021 1227   PLT 444 07/29/2021 1227   MCV 90 07/29/2021 1227   MCH 28.7 07/29/2021 1227   MCH 29.0 07/23/2021 0621   MCHC 31.9 07/29/2021 1227   MCHC 32.6 07/23/2021 0621   RDW 18.6 (H) 07/29/2021 1227   LYMPHSABS 2.4 01/02/2020 1134   EOSABS 0.2 01/02/2020 1134   BASOSABS 0.1 01/02/2020 1134    BMET    Component Value Date/Time   NA 145 10/15/2021 0000   K 4.0 10/15/2021 0000   CL 108 10/15/2021 0000   CO2 33 (A) 10/15/2021 0000   GLUCOSE 101 (H) 07/29/2021 1227   GLUCOSE 124 (H) 07/24/2021 0118   BUN 46 (A) 10/15/2021 0000   CREATININE 1.5 (A) 10/15/2021 0000   CREATININE 1.92 (H) 07/29/2021 1227   CALCIUM 9.5 10/15/2021 0000   GFRNONAA 20 (L) 07/24/2021 0118   GFRAA 54 (L) 06/26/2020 0948    BNP    Component Value Date/Time   BNP 129.8 (H) 07/23/2021 0133    ProBNP No results found for: PROBNP  Imaging: DG Chest 2  View  Result Date: 02/15/2022 CLINICAL DATA:  Status post thoracentesis. EXAM: CHEST - 2 VIEW COMPARISON:  02/04/2022 FINDINGS: Lungs are hyperexpanded. Small left and tiny right pleural effusions evident. No evidence for pneumothorax. The cardio pericardial silhouette is enlarged. The visualized bony structures of the thorax are unremarkable. IMPRESSION: Bilateral pleural effusions, left greater than right. No pneumothorax. Electronically Signed   By: Misty Stanley M.D.   On: 02/15/2022 10:09   CT Chest Wo Contrast  Result Date: 01/27/2022 CLINICAL DATA:  Follow-up right middle lobe nodule EXAM: CT CHEST WITHOUT CONTRAST TECHNIQUE: Multidetector CT imaging of the chest was performed following the standard protocol without IV contrast. RADIATION DOSE REDUCTION: This exam was performed according to the departmental dose-optimization program which includes automated exposure control, adjustment of the mA and/or kV according to patient size and/or use of iterative reconstruction technique. COMPARISON:  12/30/2020 FINDINGS: Cardiovascular: Limited due to lack of IV contrast. Atherosclerotic calcifications of the thoracic aorta are noted. No aneurysmal dilatation is noted. MV coronary calcifications are seen. No cardiac enlargement is noted. Small pericardial effusion is noted new from the prior study. Mediastinum/Nodes: Thoracic inlet demonstrates thyromegaly stable in appearance from prior ultrasound examination. No hilar or mediastinal adenopathy is noted. The esophagus is within normal limits. Lungs/Pleura: Diffuse emphysematous changes are noted. Small effusions are noted left greater than right. Mild left basilar atelectasis is noted. The previously seen right middle lobe nodule is again identified and stable. Given its stability, no further follow-up is recommended. Upper Abdomen: Renal cystic changes noted bilaterally. Some of these are hyperdense in nature but stable in appearance from previous exam.  Musculoskeletal: Degenerative changes of the thoracic spine are noted. No acute rib abnormality is seen. IMPRESSION: Stable right middle lobe nodule. Given its stability, no further follow-up is recommended. Enlarged thyroid stable in appearance from the prior CT examination. This has been evaluated on previous imaging. (ref: J Am Coll Radiol. 2015 Feb;12(2): 143-50). Small pleural effusions left greater than right with associated  left basilar atelectasis. Chronic renal cystic change some of which are hyperdense in nature stable from the prior study. Aortic Atherosclerosis (ICD10-I70.0) and Emphysema (ICD10-J43.9). Electronically Signed   By: Inez Catalina M.D.   On: 01/27/2022 22:15   DG CHEST PORT 1 VIEW  Result Date: 02/04/2022 CLINICAL DATA:  Status post left-sided thoracentesis. EXAM: PORTABLE CHEST 1 VIEW COMPARISON:  CT scan 01/27/2022. FINDINGS: Slight interval decrease in size of the left pleural fluid collection. No postprocedural pneumothorax. The right lung remains relatively clear. No definite right-sided pleural effusion. Stable borderline cardiac enlargement and tortuous calcified thoracic aorta. IMPRESSION: Slight interval decrease in size of the left pleural fluid collection. No postprocedural pneumothorax. Electronically Signed   By: Marijo Sanes M.D.   On: 02/04/2022 15:38          View : No data to display.          No results found for: NITRICOXIDE      Assessment & Plan:   COPD (chronic obstructive pulmonary disease) ([place]) COPD with emphysema appears currently stable.  Continue on current regimen  Plan  Patient Instructions  Continue on Wixela 1 puff Twice daily  , rinse after use  Albuterol inhaler As needed   Activity as tolerated.  Continue on Oxygen 3l/m  High protein diet.  Continue with Palliative care  Follow up with Dr. Valeta Harms in 3 months and As needed   Please contact office for sooner follow up if symptoms do not improve or worsen or seek emergency  care       Protein-calorie malnutrition, severe Recommend high-protein diet and supplements  Pleural effusion Left greater than right pleural effusion.  Recent thoracentesis on left effusion with 300 cc removed.  Patient with slight perceived clinical benefit.  Does not have substantial symptom burden.  Cytology was negative for malignant cells.  Pleural fluid cultures were negative.  Pleural fluid analysis (w/out serum labs) does appear this may be more transudative in nature.  Unfortunately patient has chronic kidney disease, does not appear to be overtly volume overloaded.  We will hold on diuresis at this time.  Plan  Patient Instructions  Continue on Wixela 1 puff Twice daily  , rinse after use  Albuterol inhaler As needed   Activity as tolerated.  Continue on Oxygen 3l/m  High protein diet.  Continue with Palliative care  Follow up with Dr. Valeta Harms in 3 months and As needed   Please contact office for sooner follow up if symptoms do not improve or worsen or seek emergency care       Chronic kidney disease (CKD) stage G3a/A1, moderately decreased glomerular filtration rate (GFR) between 45-59 mL/min/1.73 square meter and albuminuria creatinine ratio less than 30 mg/g (HCC) Chronic kidney disease.  Continue follow-up with nephrology.  Severe anemia Continue follow-up with primary care.  Has chronic anemia.      Rexene Edison, NP 02/15/2022

## 2022-02-25 DIAGNOSIS — E43 Unspecified severe protein-calorie malnutrition: Secondary | ICD-10-CM | POA: Diagnosis not present

## 2022-03-03 ENCOUNTER — Other Ambulatory Visit: Payer: Self-pay | Admitting: Nurse Practitioner

## 2022-03-03 DIAGNOSIS — E039 Hypothyroidism, unspecified: Secondary | ICD-10-CM

## 2022-03-06 ENCOUNTER — Other Ambulatory Visit: Payer: Self-pay | Admitting: Nurse Practitioner

## 2022-03-06 DIAGNOSIS — E039 Hypothyroidism, unspecified: Secondary | ICD-10-CM

## 2022-03-23 ENCOUNTER — Ambulatory Visit (INDEPENDENT_AMBULATORY_CARE_PROVIDER_SITE_OTHER): Payer: Medicare Other | Admitting: Nurse Practitioner

## 2022-03-23 ENCOUNTER — Encounter: Payer: Self-pay | Admitting: Nurse Practitioner

## 2022-03-23 VITALS — BP 132/64 | Temp 98.1°F | Ht 61.0 in

## 2022-03-23 DIAGNOSIS — E1169 Type 2 diabetes mellitus with other specified complication: Secondary | ICD-10-CM

## 2022-03-23 DIAGNOSIS — I10 Essential (primary) hypertension: Secondary | ICD-10-CM | POA: Diagnosis not present

## 2022-03-23 DIAGNOSIS — R630 Anorexia: Secondary | ICD-10-CM

## 2022-03-23 DIAGNOSIS — I5032 Chronic diastolic (congestive) heart failure: Secondary | ICD-10-CM

## 2022-03-23 DIAGNOSIS — E039 Hypothyroidism, unspecified: Secondary | ICD-10-CM | POA: Diagnosis not present

## 2022-03-23 DIAGNOSIS — E46 Unspecified protein-calorie malnutrition: Secondary | ICD-10-CM

## 2022-03-23 DIAGNOSIS — I110 Hypertensive heart disease with heart failure: Secondary | ICD-10-CM

## 2022-03-23 DIAGNOSIS — N1832 Chronic kidney disease, stage 3b: Secondary | ICD-10-CM

## 2022-03-23 DIAGNOSIS — E538 Deficiency of other specified B group vitamins: Secondary | ICD-10-CM | POA: Diagnosis not present

## 2022-03-23 DIAGNOSIS — E1122 Type 2 diabetes mellitus with diabetic chronic kidney disease: Secondary | ICD-10-CM | POA: Diagnosis not present

## 2022-03-23 DIAGNOSIS — Z23 Encounter for immunization: Secondary | ICD-10-CM | POA: Diagnosis not present

## 2022-03-23 NOTE — Progress Notes (Signed)
I,Victoria T Hamilton,acting as a Education administrator for [name], FNP.,have documented all relevant documentation on the behalf of [name], FNP,as directed by  [name], FNP while in the presence of [name], [place].  This visit occurred during the SARS-CoV-2 public health emergency.  Safety protocols were in place, including screening questions prior to the visit, additional usage of staff PPE, and extensive cleaning of exam room while observing appropriate contact time as indicated for disinfecting solutions.  Subjective:     Patient ID: Leslie Reeves , female    DOB: 05/29/1941 , 81 y.o.   MRN: [id number]   Chief Complaint  Patient presents with   Hypertension    HPI  Patient presents today for a bp check she is here with her daughter.  Her daughter reports she has not been eating well at all. She wont drink the Glucerna shakes either. She may take a couple of bites of food.  She is now using a wheelchair when going long distances. She is wearing her oxygen at all times.  She is sleeping more and her daughter is trying to get more food in her.  Hypertension This is a chronic problem. The current episode started more than 1 year ago. The problem has been gradually improving since onset. The problem is controlled. Pertinent negatives include no anxiety, chest pain, headaches, palpitations or shortness of breath. Past treatments include calcium channel blockers. The current treatment provides significant improvement.     Past Medical History:  Diagnosis Date   Allergy    Diabetes mellitus without complication (HCC)    Hypertension      Family History  Problem Relation Age of Onset   Heart disease Other    Stroke Other    Hypertension Other    COPD Other    Cancer Other    Asthma Other    Breast cancer Neg Hx      Current Outpatient Medications:    acetaminophen (TYLENOL) 325 MG tablet, Take 2 tablets (650 mg total) by mouth every 6 (six) hours as needed for headache or  mild pain., Disp: , Rfl:    albuterol (PROVENTIL HFA) 108 (90 Base) MCG/ACT inhaler, Inhale 2 puffs into the lungs every 6 (six) hours as needed for wheezing or shortness of breath., Disp: 24 g, Rfl: 3   amLODipine (NORVASC) 10 MG tablet, Take 1 tablet (10 mg total) by mouth daily., Disp: 30 tablet, Rfl: 11   diclofenac sodium (VOLTAREN) 1 % GEL, Apply 2 g topically 4 (four) times daily. (Patient taking differently: Apply 2 g topically 4 (four) times daily as needed (Pain).), Disp: 100 g, Rfl: 1   fluticasone-salmeterol (WIXELA INHUB) 100-50 MCG/ACT AEPB, Inhale 1 puff into the lungs 2 (two) times daily., Disp: 180 each, Rfl: 3   isosorbide-hydrALAZINE (BIDIL) 20-37.5 MG tablet, Take 1 tablet by mouth 3 (three) times daily., Disp: 30 tablet, Rfl: 0   levothyroxine (SYNTHROID) 112 MCG tablet, TAKE 1 TABLET BY MOUTH DAILY, Disp: 90 tablet, Rfl: 3   losartan (COZAAR) 25 MG tablet, Take 25 mg by mouth daily., Disp: , Rfl:    memantine (NAMENDA) 10 MG tablet, Take 1 tablet (10 mg total) by mouth daily., Disp: 90 tablet, Rfl: 3   rosuvastatin (CRESTOR) 20 MG tablet, TAKE 1 TABLET BY MOUTH DAILY, Disp: 100 tablet, Rfl: 2   Accu-Chek Softclix Lancets lancets, Use as instructed (Patient not taking: Reported on 03/23/2022), Disp: 100 each, Rfl: 12   blood glucose meter kit and supplies KIT,  Dispense based on patient and insurance preference. Use up to four times daily as directed. (FOR ICD-9 250.00, 250.01). (Patient not taking: Reported on 03/23/2022), Disp: 1 each, Rfl: 0   glucose blood (ACCU-CHEK AVIVA PLUS) test strip, Use as instructed (Patient not taking: Reported on 03/23/2022), Disp: 300 each, Rfl: 3   metoprolol succinate (TOPROL-XL) 25 MG 24 hr tablet, Take 1 tablet (25 mg total) by mouth daily., Disp: 30 tablet, Rfl: 2   mirtazapine (REMERON) 15 MG tablet, Take 2 tablets (30 mg total) by mouth at bedtime. (Patient taking differently: Take 15 mg by mouth at bedtime.), Disp: 30 tablet, Rfl: 2    Allergies  Allergen Reactions   Penicillin G Rash   Quinapril Other (See Comments)    Unknown   Penicillins Rash     Review of Systems  Constitutional: Negative.   Respiratory: Negative.  Negative for shortness of breath.   Cardiovascular: Negative.  Negative for chest pain and palpitations.  Neurological: Negative.  Negative for headaches.  Psychiatric/Behavioral: Negative.       Today's Vitals   03/23/22 1028  BP: 132/64  Temp: 98.1 F (36.7 C)  Height: _0  (1.549 m)  PainSc: 0-No pain   Body mass index is 20.44 kg/m.  BP Readings from Last 3 Encounters:  03/23/22 132/64  02/15/22 140/60  02/04/22 (!) 200/49    Objective:  Physical Exam Vitals reviewed.  Constitutional:      General: She is not in acute distress.    Appearance: Normal appearance.     Comments: Frail and thin appearance  Cardiovascular:     Rate and Rhythm: Normal rate and regular rhythm.     Pulses: Normal pulses.     Heart sounds: Normal heart sounds. No murmur heard. Pulmonary:     Effort: Pulmonary effort is normal. No respiratory distress.     Breath sounds: Decreased air movement present. No wheezing.  Neurological:     General: No focal deficit present.     Mental Status: She is alert and oriented to person, place, and time.     Cranial Nerves: No cranial nerve deficit.     Motor: No weakness.  Psychiatric:        Mood and Affect: Mood normal.        Behavior: Behavior normal.        Thought Content: Thought content normal.        Judgment: Judgment normal.         Assessment And Plan:     1. Hypertensive heart disease with chronic diastolic congestive heart failure ([place]) Comments: Blood pressure is fairly controlled. Continue current medications.  - BMP8+eGFR  2. Type 2 diabetes mellitus with stage 3b chronic kidney disease, without long-term current use of insulin ([place]) Comments: Diet controlled, continue follow up with Nephrology.   3. Acquired  hypothyroidism Comments: Thyroid levels have been fairly controlled, pending labs will change medications  - TSH - T4 - T3, free  4. Malnutrition, unspecified type ([place]) Comments: Encouraged to drink ensure and boost to help with appetite.   5. B12 deficiency Comments: Will check vitamin B12 levels.  - Vitamin B12  6. Decreased appetite  7. Immunization due Comments: TransRx signed for Shingrix #1.  - Varicella-zoster vaccine IM (Shingrix)     Patient was given opportunity to ask questions. Patient verbalized understanding of the plan and was able to repeat key elements of the plan. All questions were answered to their satisfaction.  [name], FNP  Teola Bradley, FNP, have reviewed all documentation for this visit. The documentation on 03/23/22 for the exam, diagnosis, procedures, and orders are all accurate and complete.   IF YOU HAVE BEEN REFERRED TO A SPECIALIST, IT MAY TAKE 1-2 WEEKS TO SCHEDULE/PROCESS THE REFERRAL. IF YOU HAVE NOT HEARD FROM US/SPECIALIST IN TWO WEEKS, PLEASE GIVE [place] A CALL AT [phone].   THE PATIENT IS ENCOURAGED TO PRACTICE SOCIAL DISTANCING DUE TO THE COVID-19 PANDEMIC.

## 2022-03-24 LAB — BMP8+EGFR
BUN/Creatinine Ratio: 18 (ref 12–28)
BUN: 39 mg/dL — ABNORMAL HIGH (ref 8–27)
CO2: 23 mmol/L (ref 20–29)
Calcium: 8.7 mg/dL (ref 8.7–10.3)
Chloride: 108 mmol/L — ABNORMAL HIGH (ref 96–106)
Creatinine, Ser: 2.2 mg/dL — ABNORMAL HIGH (ref 0.57–1.00)
Glucose: 81 mg/dL (ref 70–99)
Potassium: 3.1 mmol/L — ABNORMAL LOW (ref 3.5–5.2)
Sodium: 145 mmol/L — ABNORMAL HIGH (ref 134–144)
eGFR: 22 mL/min/{1.73_m2} — ABNORMAL LOW (ref >59)

## 2022-03-24 LAB — VITAMIN B12: Vitamin B-12: 319 pg/mL (ref 232–1245)

## 2022-03-24 LAB — T3, FREE: T3, Free: 0.7 pg/mL — ABNORMAL LOW (ref 2.0–4.4)

## 2022-03-24 LAB — TSH: TSH: 1.29 u[IU]/mL (ref 0.450–4.500)

## 2022-03-24 LAB — T4: T4, Total: 9 ug/dL (ref 4.5–12.0)

## 2022-03-25 ENCOUNTER — Telehealth: Payer: Medicare Other

## 2022-03-27 DIAGNOSIS — E43 Unspecified severe protein-calorie malnutrition: Secondary | ICD-10-CM | POA: Diagnosis not present

## 2022-04-02 ENCOUNTER — Other Ambulatory Visit: Payer: Medicare Other | Admitting: Hospice

## 2022-04-02 DIAGNOSIS — J449 Chronic obstructive pulmonary disease, unspecified: Secondary | ICD-10-CM | POA: Diagnosis not present

## 2022-04-02 DIAGNOSIS — E43 Unspecified severe protein-calorie malnutrition: Secondary | ICD-10-CM | POA: Diagnosis not present

## 2022-04-02 DIAGNOSIS — F0390 Unspecified dementia without behavioral disturbance: Secondary | ICD-10-CM

## 2022-04-02 DIAGNOSIS — K5901 Slow transit constipation: Secondary | ICD-10-CM | POA: Diagnosis not present

## 2022-04-02 DIAGNOSIS — Z515 Encounter for palliative care: Secondary | ICD-10-CM | POA: Diagnosis not present

## 2022-04-02 NOTE — Progress Notes (Signed)
  [facility] Consult Note Telephone: [phone]  Fax: [phone]  PATIENT NAME: Leslie Reeves [address] [place] [address] [phone] (home)  DOB: 10/30/1940 MRN: [id number]  PRIMARY CARE PROVIDER:    Moore, Janece, FNP,  [address] [place] [address] [phone]  REFERRING PROVIDER:   Moore, Janece, FNP [address] [place],   [address] [phone]  RESPONSIBLE PARTY:   Sherbia Contact Information     Name Relation Home Work Mobile   Perry,Sherbia Daughter [phone]  [phone]        I met face to face with patient and family at home. Palliative Care was asked to follow this patient by consultation request of  Moore, Janece, FNP to address advance care planning, complex medical decision making and goals of care clarification. This is a face to visit follow up visit. Sherbia is present with patient during visit.     ASSESSMENT AND / RECOMMENDATIONS:   CODE STATUS: Patient is a partial code; patient wishes to have chest compressions with ACLS medications but no intubation and mechanical ventilation.  Goals of Care: Goals include to maximize quality of life and symptom management  Visit consisted of counseling and education dealing with the complex and emotionally intense issues of symptom management and palliative care in the setting of serious and potentially life-threatening illness. Palliative care team will continue to support patient, patient's family, and medical team.  Symptom Management/Plan: Protein Caloric Malnutrition: Continue Mirtazapine as ordered.  Current weight is 101 Ibs up from 84 pounds 6 months ago   Albumin 3.04 Oct 2021 from 2.4 in Oct 2022. Encourage small meals 4-6 times a day. Offer help as needed during meals to ensure adequate oral intake. Continue Glucerna. Monitor weight at home, report decline or progress. Routine CBC CMP. Dementia:  Ongoing memory loss and confusion in line with Dementia disease trajectory. FAST 6D. Encourage participation in activities, word search, word puzzle, reminiscence. Fall precautions discussed. Continue ongoing supportive care.  COPD: Location shortness of breath on exertion such as ambulation. Uses Albuterol about 3 times a week. Continue Wixela, Albuterol, Oxygen supplementation.  Encourage slow deep breathing, avoidance of irritant/triggers discussed. Pulmonologist consult as needed. CKD 4: Followed by Nephrologist. Education on limiting nephrotoxic substances, getting adequate hydration of 1200cc and reduced salt. Fu with Nephrologist as planned 04/23/22  Constipation: OTC Miralax 17 gram mixed in 4-6 ounces of fluid daily, back off with diarrhea.  Follow up: Palliative care will continue to follow for complex medical decision making, advance care planning, and clarification of goals. Return 6 weeks or prn.Encouraged to call provider sooner with any concerns.   Family /Caregiver/Community Supports: Patient lives at home with Sherbia and her family.  HOSPICE ELIGIBILITY/DIAGNOSIS: TBD  Chief Complaint: Follow up visit  HISTORY OF PRESENT ILLNESS:  Leslie Reeves is a 81 y.o. year old female  with multiple medical conditions Protein caloric malnutrition, Dementia  COPD, CKD 4, CVA, CHF, HTN. .   History obtained from review of EMR, discussion with primary team, caregiver, family and/or Ms. Guttman.  Review and summarization of Epic records shows history from other than patient. Rest of 10 point ROS asked and negative.  I reviewed, as needed, available labs, patient records, imaging, studies and related documents from the EMR.    PAST MEDICAL HISTORY:  Active Ambulatory Problems    Diagnosis Date Noted   Right thyroid nodule 10/09/2018   Decreased appetite 02/05/2019   Prediabetes 02/05/2019     Essential hypertension 02/05/2019   Solitary pulmonary nodule 05/05/2020   Mild intermittent asthma  05/05/2020   DM2 (diabetes mellitus, type 2) ([place]) 06/12/2021   AKI (acute kidney injury) ([place]) 06/12/2021   New onset of congestive heart failure ([place]) 06/12/2021   Stage 3b chronic kidney disease ([place]) 06/13/2021   Acute on chronic diastolic CHF (congestive heart failure), NYHA class 3 ([place]) 07/15/2021   Hypothyroidism 07/16/2021   Type 2 diabetes mellitus with hyperlipidemia ([place]) 07/16/2021   Failure to thrive in adult 07/16/2021   COPD (chronic obstructive pulmonary disease) ([place]) 07/16/2021   Protein-calorie malnutrition, severe 07/17/2021   Severe anemia 07/17/2021   Pleural effusion 02/01/2022   Resolved Ambulatory Problems    Diagnosis Date Noted   No Resolved Ambulatory Problems   Past Medical History:  Diagnosis Date   Allergy    Diabetes mellitus without complication ([place])    Hypertension     SOCIAL HX:  Social History   Tobacco Use   Smoking status: Former    Packs/day: 0.50    Years: 50.00    Total pack years: 25.00    Types: Cigarettes    Quit date: 2017    Years since quitting: 6.5   Smokeless tobacco: Never  Substance Use Topics   Alcohol use: Not Currently     FAMILY HX:  Family History  Problem Relation Age of Onset   Heart disease Other    Stroke Other    Hypertension Other    COPD Other    Cancer Other    Asthma Other    Breast cancer Neg Hx       ALLERGIES:  Allergies  Allergen Reactions   Penicillin G Rash   Quinapril Other (See Comments)    Unknown   Penicillins Rash      PERTINENT MEDICATIONS:  Outpatient Encounter Medications as of 04/02/2022  Medication Sig   Accu-Chek Softclix Lancets lancets Use as instructed (Patient not taking: Reported on 03/23/2022)   acetaminophen (TYLENOL) 325 MG tablet Take 2 tablets (650 mg total) by mouth every 6 (six) hours as needed for headache or mild pain.   albuterol (PROVENTIL HFA) 108 (90 Base) MCG/ACT inhaler Inhale 2 puffs into the lungs every 6 (six) hours as needed for wheezing or  shortness of breath.   amLODipine (NORVASC) 10 MG tablet Take 1 tablet (10 mg total) by mouth daily.   blood glucose meter kit and supplies KIT Dispense based on patient and insurance preference. Use up to four times daily as directed. (FOR ICD-9 250.00, 250.01). (Patient not taking: Reported on 03/23/2022)   diclofenac sodium (VOLTAREN) 1 % GEL Apply 2 g topically 4 (four) times daily. (Patient taking differently: Apply 2 g topically 4 (four) times daily as needed (Pain).)   fluticasone-salmeterol (WIXELA INHUB) 100-50 MCG/ACT AEPB Inhale 1 puff into the lungs 2 (two) times daily.   glucose blood (ACCU-CHEK AVIVA PLUS) test strip Use as instructed (Patient not taking: Reported on 03/23/2022)   isosorbide-hydrALAZINE (BIDIL) 20-37.5 MG tablet Take 1 tablet by mouth 3 (three) times daily.   levothyroxine (SYNTHROID) 112 MCG tablet TAKE 1 TABLET BY MOUTH DAILY   losartan (COZAAR) 25 MG tablet Take 25 mg by mouth daily.   memantine (NAMENDA) 10 MG tablet Take 1 tablet (10 mg total) by mouth daily.   metoprolol succinate (TOPROL-XL) 25 MG 24 hr tablet Take 1 tablet (25 mg total) by mouth daily.   mirtazapine (REMERON) 15 MG tablet Take 2 tablets (30 mg total) by mouth  at bedtime. (Patient taking differently: Take 15 mg by mouth at bedtime.)   rosuvastatin (CRESTOR) 20 MG tablet TAKE 1 TABLET BY MOUTH DAILY   No facility-administered encounter medications on file as of 04/02/2022.    I spent 60 minutes providing this consultation; time includes spent with patient/family, chart review and documentation. More than 50% of the time in this consultation was spent on care coordination.  Thank you for the opportunity to participate in the care of Ms. Mott.  The palliative care team will continue to follow. Please call our office at [phone] if we can be of additional assistance.   Note: Portions of this note were generated with Dragon dictation software. Dictation errors may occur despite best attempts at  proofreading.   I , NP     

## 2022-04-21 DIAGNOSIS — N184 Chronic kidney disease, stage 4 (severe): Secondary | ICD-10-CM | POA: Diagnosis not present

## 2022-04-26 DIAGNOSIS — R809 Proteinuria, unspecified: Secondary | ICD-10-CM | POA: Diagnosis not present

## 2022-04-26 DIAGNOSIS — E1122 Type 2 diabetes mellitus with diabetic chronic kidney disease: Secondary | ICD-10-CM | POA: Diagnosis not present

## 2022-04-26 DIAGNOSIS — E673 Hypervitaminosis D: Secondary | ICD-10-CM | POA: Diagnosis not present

## 2022-04-26 DIAGNOSIS — D631 Anemia in chronic kidney disease: Secondary | ICD-10-CM | POA: Diagnosis not present

## 2022-04-26 DIAGNOSIS — I129 Hypertensive chronic kidney disease with stage 1 through stage 4 chronic kidney disease, or unspecified chronic kidney disease: Secondary | ICD-10-CM | POA: Diagnosis not present

## 2022-04-26 DIAGNOSIS — N184 Chronic kidney disease, stage 4 (severe): Secondary | ICD-10-CM | POA: Diagnosis not present

## 2022-04-26 DIAGNOSIS — I5032 Chronic diastolic (congestive) heart failure: Secondary | ICD-10-CM | POA: Diagnosis not present

## 2022-04-27 DIAGNOSIS — E43 Unspecified severe protein-calorie malnutrition: Secondary | ICD-10-CM | POA: Diagnosis not present

## 2022-04-28 ENCOUNTER — Ambulatory Visit: Payer: Medicare Other

## 2022-04-28 ENCOUNTER — Ambulatory Visit: Payer: Medicare Other | Admitting: Nurse Practitioner

## 2022-04-28 DIAGNOSIS — N184 Chronic kidney disease, stage 4 (severe): Secondary | ICD-10-CM | POA: Diagnosis not present

## 2022-05-04 ENCOUNTER — Ambulatory Visit: Payer: Self-pay

## 2022-05-04 NOTE — Progress Notes (Signed)
This encounter was created in error - please disregard.

## 2022-05-04 NOTE — Patient Outreach (Signed)
  Care Coordination   Follow Up Visit Note   05/04/2022 Name: TERRYANN VERBEEK MRN: [id number] DOB: 1941/09/15  Ceasar Mons Lipuma is a 81 y.o. year old female who sees [name], [place] for primary care. I spoke with patient's daughter [name] by phone today.  What matters to the patients health and wellness today?  Patient's daughter would like for Ms. Fromm to move around more and be more active.     Goals Addressed       Patient Stated     "To get up and move a Janecia Palau more" (pt-stated)        Care Coordination Interventions: Provided written and verbal education re: potential causes of falls and Fall prevention strategies Advised patient of importance of notifying provider of falls Assessed for falls since last encounter Educated daughter [name] about disease process related to Vascular dementia, including how this may affect mobility and balance Assessed for home safety concerns, none identified at this time  Mailed printed educational materials related to Chair Exercises     SDOH assessments and interventions completed:  Yes     Care Coordination Interventions Activated:  Yes  Care Coordination Interventions:  Yes, provided   Follow up plan: Follow up call scheduled for 07/21/22 _0 :00 AM     Encounter Outcome:  Pt. Visit Completed

## 2022-05-04 NOTE — Patient Instructions (Signed)
Visit Information  Thank you for taking time to visit with me today. Please don't hesitate to contact me if I can be of assistance to you.   Following are the goals we discussed today:   Goals Addressed       Patient Stated     "To get up and move a Kriston Pasquarello more" (pt-stated)        Care Coordination Interventions: Provided written and verbal education re: potential causes of falls and Fall prevention strategies Advised patient of importance of notifying provider of falls Assessed for falls since last encounter Educated daughter [name] about disease process related to Vascular dementia, including how this may affect mobility and balance Assessed for home safety concerns, none identified at this time  Mailed printed educational materials related to Chair Exercises     Our next appointment is by telephone on 07/21/22 at 09:00 AM   Please call the care guide team at [phone] if you need to cancel or reschedule your appointment.   If you are experiencing a Mental Health or [place] or need someone to talk to, please call 1-800-273-TALK (toll free, 24 hour hotline)  Patient verbalizes understanding of instructions and care plan provided today and agrees to view in [place]. Active MyChart status and patient understanding of how to access instructions and care plan via MyChart confirmed with patient.     Barb Merino, RN, BSN, CCM Care Management Coordinator [facility] Care Management  Direct Phone: [phone]

## 2022-05-07 ENCOUNTER — Encounter (HOSPITAL_COMMUNITY)
Admission: RE | Admit: 2022-05-07 | Discharge: 2022-05-07 | Disposition: A | Discharge status: Home / Self Care | Payer: Medicare Other | Source: Ambulatory Visit | Attending: Nephrology | Admitting: Nephrology

## 2022-05-07 VITALS — BP 135/81 | HR 57 | Temp 97.9°F

## 2022-05-07 DIAGNOSIS — N1832 Chronic kidney disease, stage 3b: Secondary | ICD-10-CM | POA: Diagnosis not present

## 2022-05-07 LAB — POCT HEMOGLOBIN-HEMACUE: Hemoglobin: 7.9 g/dL — ABNORMAL LOW (ref 12.0–15.0)

## 2022-05-07 MED ORDER — EPOETIN ALFA-EPBX 10000 UNIT/ML IJ SOLN: 10000.0000 [IU] | INTRAMUSCULAR | Status: DC

## 2022-05-07 MED ORDER — EPOETIN ALFA 10000 UNIT/ML IJ SOLN
INTRAMUSCULAR | Status: AC
  Filled 2022-05-07: qty 1

## 2022-05-07 MED ORDER — EPOETIN ALFA 10000 UNIT/ML IJ SOLN
10000.0000 [IU] | Freq: Once | INTRAMUSCULAR | Status: AC
  Administered 2022-05-07: 10000 [IU] via SUBCUTANEOUS

## 2022-05-07 NOTE — Progress Notes (Signed)
Spoke to [employer] at [employer] re: HGB 7.9. Patients daughter denied any black stools or blood in stool. This is first injection and per Amber HgB is better- so will give injection as ordered.

## 2022-05-12 ENCOUNTER — Ambulatory Visit (INDEPENDENT_AMBULATORY_CARE_PROVIDER_SITE_OTHER): Payer: Medicare Other

## 2022-05-12 VITALS — Ht 62.0 in | Wt 111.0 lb

## 2022-05-12 DIAGNOSIS — Z0000 Encounter for general adult medical examination without abnormal findings: Secondary | ICD-10-CM | POA: Diagnosis not present

## 2022-05-12 NOTE — Patient Instructions (Signed)
Leslie Reeves , Thank you for taking time to come for your Medicare Wellness Visit. I appreciate your ongoing commitment to your health goals. Please review the following plan we discussed and let me know if I can assist you in the future.   Screening recommendations/referrals: Colonoscopy: not required Mammogram: not required Bone Density: completed 07/06/2021 Recommended yearly ophthalmology/optometry visit for glaucoma screening and checkup Recommended yearly dental visit for hygiene and checkup  Vaccinations: Influenza vaccine: due Pneumococcal vaccine: completed 03/14/2021 Tdap vaccine: completed 12/17/2021, due 12/18/2031 Shingles vaccine: needs second dose   Covid-19: 03/14/2021, 07/21/2020, 12/10/2019, 11/17/2019  Advanced directives: Advance directive discussed with you today.   Conditions/risks identified: none  Next appointment: Follow up in one year for your annual wellness visit    Preventive Care 65 Years and Older, Female Preventive care refers to lifestyle choices and visits with your health care provider that can promote health and wellness. What does preventive care include? A yearly physical exam. This is also called an annual well check. Dental exams once or twice a year. Routine eye exams. Ask your health care provider how often you should have your eyes checked. Personal lifestyle choices, including: Daily care of your teeth and gums. Regular physical activity. Eating a healthy diet. Avoiding tobacco and drug use. Limiting alcohol use. Practicing safe sex. Taking low-dose aspirin every day. Taking vitamin and mineral supplements as recommended by your health care provider. What happens during an annual well check? The services and screenings done by your health care provider during your annual well check will depend on your age, overall health, lifestyle risk factors, and family history of disease. Counseling  Your health care provider may ask you questions  about your: Alcohol use. Tobacco use. Drug use. Emotional well-being. Home and relationship well-being. Sexual activity. Eating habits. History of falls. Memory and ability to understand (cognition). Work and work Statistician. Reproductive health. Screening  You may have the following tests or measurements: Height, weight, and BMI. Blood pressure. Lipid and cholesterol levels. These may be checked every 5 years, or more frequently if you are over 81 years old. Skin check. Lung cancer screening. You may have this screening every year starting at age 81 if you have a 30-pack-year history of smoking and currently smoke or have quit within the past 15 years. Fecal occult blood test (FOBT) of the stool. You may have this test every year starting at age 81. Flexible sigmoidoscopy or colonoscopy. You may have a sigmoidoscopy every 5 years or a colonoscopy every 10 years starting at age 81. Hepatitis C blood test. Hepatitis B blood test. Sexually transmitted disease (STD) testing. Diabetes screening. This is done by checking your blood sugar (glucose) after you have not eaten for a while (fasting). You may have this done every 1-3 years. Bone density scan. This is done to screen for osteoporosis. You may have this done starting at age 81. Mammogram. This may be done every 1-2 years. Talk to your health care provider about how often you should have regular mammograms. Talk with your health care provider about your test results, treatment options, and if necessary, the need for more tests. Vaccines  Your health care provider may recommend certain vaccines, such as: Influenza vaccine. This is recommended every year. Tetanus, diphtheria, and acellular pertussis (Tdap, Td) vaccine. You may need a Td booster every 10 years. Zoster vaccine. You may need this after age 60. Pneumococcal 13-valent conjugate (PCV13) vaccine. One dose is recommended after age 45. Pneumococcal polysaccharide (PPSV23)  vaccine. One dose is recommended after age 25. Talk to your health care provider about which screenings and vaccines you need and how often you need them. This information is not intended to replace advice given to you by your health care provider. Make sure you discuss any questions you have with your health care provider. Document Released: 10/10/2015 Document Revised: 06/02/2016 Document Reviewed: 07/15/2015 Elsevier Interactive Patient Education  2017 [employer] Prevention in the Home Falls can cause injuries. They can happen to people of all ages. There are many things you can do to make your home safe and to help prevent falls. What can I do on the outside of my home? Regularly fix the edges of walkways and driveways and fix any cracks. Remove anything that might make you trip as you walk through a door, such as a raised step or threshold. Trim any bushes or trees on the path to your home. Use bright outdoor lighting. Clear any walking paths of anything that might make someone trip, such as rocks or tools. Regularly check to see if handrails are loose or broken. Make sure that both sides of any steps have handrails. Any raised decks and porches should have guardrails on the edges. Have any leaves, snow, or ice cleared regularly. Use sand or salt on walking paths during winter. Clean up any spills in your garage right away. This includes oil or grease spills. What can I do in the bathroom? Use night lights. Install grab bars by the toilet and in the tub and shower. Do not use towel bars as grab bars. Use non-skid mats or decals in the tub or shower. If you need to sit down in the shower, use a plastic, non-slip stool. Keep the floor dry. Clean up any water that spills on the floor as soon as it happens. Remove soap buildup in the tub or shower regularly. Attach bath mats securely with double-sided non-slip rug tape. Do not have throw rugs and other things on the floor that  can make you trip. What can I do in the bedroom? Use night lights. Make sure that you have a light by your bed that is easy to reach. Do not use any sheets or blankets that are too big for your bed. They should not hang down onto the floor. Have a firm chair that has side arms. You can use this for support while you get dressed. Do not have throw rugs and other things on the floor that can make you trip. What can I do in the kitchen? Clean up any spills right away. Avoid walking on wet floors. Keep items that you use a lot in easy-to-reach places. If you need to reach something above you, use a strong step stool that has a grab bar. Keep electrical cords out of the way. Do not use floor polish or wax that makes floors slippery. If you must use wax, use non-skid floor wax. Do not have throw rugs and other things on the floor that can make you trip. What can I do with my stairs? Do not leave any items on the stairs. Make sure that there are handrails on both sides of the stairs and use them. Fix handrails that are broken or loose. Make sure that handrails are as long as the stairways. Check any carpeting to make sure that it is firmly attached to the stairs. Fix any carpet that is loose or worn. Avoid having throw rugs at the top or bottom of  the stairs. If you do have throw rugs, attach them to the floor with carpet tape. Make sure that you have a light switch at the top of the stairs and the bottom of the stairs. If you do not have them, ask someone to add them for you. What else can I do to help prevent falls? Wear shoes that: Do not have high heels. Have rubber bottoms. Are comfortable and fit you well. Are closed at the toe. Do not wear sandals. If you use a stepladder: Make sure that it is fully opened. Do not climb a closed stepladder. Make sure that both sides of the stepladder are locked into place. Ask someone to hold it for you, if possible. Clearly mark and make sure that you  can see: Any grab bars or handrails. First and last steps. Where the edge of each step is. Use tools that help you move around (mobility aids) if they are needed. These include: Canes. Walkers. Scooters. Crutches. Turn on the lights when you go into a dark area. Replace any light bulbs as soon as they burn out. Set up your furniture so you have a clear path. Avoid moving your furniture around. If any of your floors are uneven, fix them. If there are any pets around you, be aware of where they are. Review your medicines with your doctor. Some medicines can make you feel dizzy. This can increase your chance of falling. Ask your doctor what other things that you can do to help prevent falls. This information is not intended to replace advice given to you by your health care provider. Make sure you discuss any questions you have with your health care provider. Document Released: 07/10/2009 Document Revised: 02/19/2016 Document Reviewed: 10/18/2014 Elsevier Interactive Patient Education  2017 [employer].

## 2022-05-12 NOTE — Progress Notes (Signed)
I connected with Lenore Cordia today by telephone and verified that I am speaking with the correct person using two identifiers. Location patient: home Location provider: work Persons participating in the virtual visit: Ashunti Schofield, [name] (daughter), Glenna Durand LPN.   I discussed the limitations, risks, security and privacy concerns of performing an evaluation and management service by telephone and the availability of in person appointments. I also discussed with the patient that there may be a patient responsible charge related to this service. The patient expressed understanding and verbally consented to this telephonic visit.    Interactive audio and video telecommunications were attempted between this provider and patient, however failed, due to patient having technical difficulties OR patient did not have access to video capability.  We continued and completed visit with audio only.     Vital signs may be patient reported or missing.  Subjective:   Leslie Reeves is a 81 y.o. female who presents for Medicare Annual (Subsequent) preventive examination.  Review of Systems     Cardiac Risk Factors include: advanced age (>34mn, >>29women);diabetes mellitus;dyslipidemia;hypertension     Objective:    Today's Vitals   05/12/22 0915  Weight: 111 lb (50.3 kg)  Height: 5' 2" (1.575 m)   Body mass index is 20.3 kg/m.     05/12/2022    9:24 AM 07/15/2021    6:00 PM 06/12/2021    2:45 AM 04/01/2021   10:50 AM 03/25/2021    9:24 AM 03/02/2021    2:39 PM 03/02/2021    2:31 PM  Advanced Directives  Does Patient Have a Medical Advance Directive? _0  No No  Would patient like information on creating a medical advance directive?  No - Patient declined No - Guardian declined No - Patient declined Yes (MAU/Ambulatory/Procedural Areas - Information given) Yes (MAU/Ambulatory/Procedural Areas - Information given) No - Patient declined    Current Medications  (verified) Outpatient Encounter Medications as of 05/12/2022  Medication Sig   acetaminophen (TYLENOL) 325 MG tablet Take 2 tablets (650 mg total) by mouth every 6 (six) hours as needed for headache or mild pain.   albuterol (PROVENTIL HFA) 108 (90 Base) MCG/ACT inhaler Inhale 2 puffs into the lungs every 6 (six) hours as needed for wheezing or shortness of breath.   amLODipine (NORVASC) 10 MG tablet Take 1 tablet (10 mg total) by mouth daily.   fluticasone-salmeterol (WIXELA INHUB) 100-50 MCG/ACT AEPB Inhale 1 puff into the lungs 2 (two) times daily.   isosorbide-hydrALAZINE (BIDIL) 20-37.5 MG tablet Take 1 tablet by mouth 3 (three) times daily.   levothyroxine (SYNTHROID) 112 MCG tablet TAKE 1 TABLET BY MOUTH DAILY   losartan (COZAAR) 25 MG tablet Take 25 mg by mouth daily.   memantine (NAMENDA) 10 MG tablet Take 1 tablet (10 mg total) by mouth daily.   mirtazapine (REMERON) 15 MG tablet Take 2 tablets (30 mg total) by mouth at bedtime. (Patient taking differently: Take 15 mg by mouth at bedtime.)   rosuvastatin (CRESTOR) 20 MG tablet TAKE 1 TABLET BY MOUTH DAILY   Accu-Chek Softclix Lancets lancets Use as instructed (Patient not taking: Reported on 03/23/2022)   blood glucose meter kit and supplies KIT Dispense based on patient and insurance preference. Use up to four times daily as directed. (FOR ICD-9 250.00, 250.01). (Patient not taking: Reported on 03/23/2022)   diclofenac sodium (VOLTAREN) 1 % GEL Apply 2 g topically 4 (four) times daily. (Patient not taking: Reported on 05/12/2022)  glucose blood (ACCU-CHEK AVIVA PLUS) test strip Use as instructed (Patient not taking: Reported on 03/23/2022)   metoprolol succinate (TOPROL-XL) 25 MG 24 hr tablet Take 1 tablet (25 mg total) by mouth daily.   No facility-administered encounter medications on file as of 05/12/2022.    Allergies (verified) Penicillin g, Quinapril, and Penicillins   History: Past Medical History:  Diagnosis Date   Allergy     Diabetes mellitus without complication ([place])    Hypertension    Past Surgical History:  Procedure Laterality Date   ABDOMINAL HYSTERECTOMY     THORACENTESIS Left 02/04/2022   Procedure: THORACENTESIS;  Surgeon: Candee Furbish, MD;  Location: [facility];  Service: Pulmonary;  Laterality: Left;   Family History  Problem Relation Age of Onset   Heart disease Other    Stroke Other    Hypertension Other    COPD Other    Cancer Other    Asthma Other    Breast cancer Neg Hx    Social History   Socioeconomic History   Marital status: Married    Spouse name: Not on file   Number of children: 2   Years of education: Not on file   Highest education level: High school graduate  Occupational History   Occupation: retired  Tobacco Use   Smoking status: Former    Packs/day: 0.50    Years: 50.00    Total pack years: 25.00    Types: Cigarettes    Quit date: 2017    Years since quitting: 6.6   Smokeless tobacco: Never  Vaping Use   Vaping Use: Never used  Substance and Sexual Activity   Alcohol use: Not Currently   Drug use: Not Currently   Sexual activity: Not Currently  Other Topics Concern   Not on file  Social History Narrative   Not on file   Social Determinants of Health   Financial Resource Strain: Low Risk  (05/12/2022)   Overall Financial Resource Strain (CARDIA)    Difficulty of Paying Living Expenses: Not hard at all  Food Insecurity: No Food Insecurity (05/12/2022)   Hunger Vital Sign    Worried About Running Out of Food in the Last Year: Never true    [place] in the Last Year: Never true  Transportation Needs: No Transportation Needs (05/12/2022)   PRAPARE - Hydrologist (Medical): No    Lack of Transportation (Non-Medical): No  Physical Activity: Inactive (05/12/2022)   Exercise Vital Sign    Days of Exercise per Week: 0 days    Minutes of Exercise per Session: 0 min  Stress: No Stress Concern Present (05/12/2022)    [place]    Feeling of Stress : Not at all  Social Connections: Not on file    Tobacco Counseling Counseling given: Not Answered   Clinical Intake:  Pre-visit preparation completed: Yes  Pain : No/denies pain     Nutritional Status: BMI of 19-24  Normal Nutritional Risks: None Diabetes: Yes  How often do you need to have someone help you when you read instructions, pamphlets, or other written materials from your doctor or pharmacy?: 5 - Always  Diabetic? Yes Nutrition Risk Assessment:  Has the patient had any N/V/D within the last 2 months?  No  Does the patient have any non-healing wounds?  No  Has the patient had any unintentional weight loss or weight gain?  No   Diabetes:  Is the patient diabetic?  Yes  If diabetic, was a CBG obtained today?  No  Did the patient bring in their glucometer from home?  No  How often do you monitor your CBG's? Does not.   Financial Strains and Diabetes Management:  Are you having any financial strains with the device, your supplies or your medication? No .  Does the patient want to be seen by Chronic Care Management for management of their diabetes?  No  Would the patient like to be referred to a Nutritionist or for Diabetic Management?  No   Diabetic Exams:  Diabetic Eye Exam: Overdue for diabetic eye exam. Pt has been advised about the importance in completing this exam. Patient advised to call and schedule an eye exam. Diabetic Foot Exam: Completed 07/13/2021    Interpreter Needed?: No  Information entered by :: NAllen LPN   Activities of Daily Living    05/12/2022    9:28 AM 07/15/2021    6:00 PM  In your present state of health, do you have any difficulty performing the following activities:  Hearing? 1 0  Vision? 0 0  Difficulty concentrating or making decisions? 1 0  Comment dementia   Walking or climbing stairs? 1 1  Dressing or bathing? 1 0   Doing errands, shopping? 1 1  Preparing Food and eating ? Y   Using the Toilet? Y   In the past six months, have you accidently leaked urine? Y   Do you have problems with loss of bowel control? N   Managing your Medications? Y   Managing your Finances? Y   Housekeeping or managing your Housekeeping? Y     Patient Care Team: [name], FNP as PCP - General (General Practice) Harl Bowie Royetta Crochet, MD as PCP - Cardiology (Cardiology) Rex Kras [name], RN as [place], Sharyn Blitz, [facility] (Pharmacist)  Indicate any recent Medical Services you may have received from other than [facility] providers in the past year (date may be approximate).     Assessment:   This is a routine wellness examination for Haruka.  Hearing/Vision screen Vision Screening - Comments:: No regular eye exams, [facility]  Dietary issues and exercise activities discussed: Current Exercise Habits: The patient does not participate in regular exercise at present   Goals Addressed             This Visit's Progress    Patient Stated       05/12/2022, no goals       Depression Screen    05/12/2022    9:27 AM 04/01/2021   10:51 AM 03/25/2021    9:24 AM 03/26/2020   10:21 AM 01/02/2020   10:24 AM 11/21/2019   10:16 AM 03/21/2019   11:41 AM  PHQ 2/9 Scores  PHQ - 2 Score 0 0 0 0 0 0 0  PHQ- 9 Score       3    Fall Risk    05/12/2022    9:26 AM 12/17/2021   10:15 AM 04/01/2021   10:50 AM 03/25/2021    9:24 AM 03/26/2020   10:21 AM  Fall Risk   Falls in the past year? 1 1 0 0 0  Comment weak legs and give out      Number falls in past yr: 1 1  0   Injury with Fall? 0 1     Risk for fall due to : Impaired balance/gait;Impaired mobility;Medication side effect;History of fall(s)  Medication side effect  Medication side effect  Follow up Falls evaluation completed;Education provided;Falls prevention discussed  Falls evaluation completed;Education provided;Falls prevention discussed   Falls evaluation completed;Education provided;Falls prevention discussed    FALL RISK PREVENTION PERTAINING TO THE HOME:  Any stairs in or around the home? Yes  If so, are there any without handrails? Yes  Home free of loose throw rugs in walkways, pet beds, electrical cords, etc? Yes  Adequate lighting in your home to reduce risk of falls? Yes   ASSISTIVE DEVICES UTILIZED TO PREVENT FALLS:  Life alert? No  Use of a cane, walker or w/c? Yes  Grab bars in the bathroom? No  Shower chair or bench in shower? No  Elevated toilet seat or a handicapped toilet? Yes   TIMED UP AND GO:  Was the test performed? No .      Cognitive Function:    08/24/2021    2:38 PM  MMSE - Mini Mental State Exam  Orientation to time 2  Orientation to Place 5  Registration 3  Attention/ Calculation 0  Recall 0  Language- name 2 objects 2  Language- repeat 1  Language- follow 3 step command 3  Language- read & follow direction 0  Write a sentence 1  Copy design 0  Total score 17        04/01/2021   10:53 AM 01/20/2021   10:52 AM 03/26/2020   10:22 AM 03/21/2019   11:44 AM  6CIT Screen  What Year? 0 points 0 points 0 points 0 points  What month? 0 points 0 points 0 points 0 points  What time? 0 points 0 points 0 points 0 points  Count back from 20 0 points 0 points 0 points 0 points  Months in reverse 0 points 2 points 0 points 0 points  Repeat phrase 10 points 6 points 6 points 2 points  Total Score 10 points 8 points 6 points 2 points    Immunizations Immunization History  Administered Date(s) Administered   Fluad Quad(high Dose 65+) 09/02/2021   Influenza,inj,Quad PF,6+ Mos 07/06/2018   Influenza-Unspecified 06/27/2013, 06/28/2019   PFIZER(Purple Top)SARS-COV-2 Vaccination 11/17/2019, 12/10/2019, 07/21/2020, 03/14/2021   PNEUMOCOCCAL CONJUGATE-20 03/14/2021   Tdap 12/17/2021   Zoster Recombinat (Shingrix) 03/23/2022    TDAP status: Up to date  Flu Vaccine status: Due,  Education has been provided regarding the importance of this vaccine. Advised may receive this vaccine at local pharmacy or Health Dept. Aware to provide a copy of the vaccination record if obtained from local pharmacy or Health Dept. Verbalized acceptance and understanding.  Pneumococcal vaccine status: Up to date  Covid-19 vaccine status: Completed vaccines  Qualifies for Shingles Vaccine? Yes   Zostavax completed No   Shingrix Completed?: needs second dose  Screening Tests Health Maintenance  Topic Date Due   COVID-19 Vaccine (5 - Pfizer risk series) 05/09/2021   OPHTHALMOLOGY EXAM  06/09/2021   INFLUENZA VACCINE  04/27/2022   Zoster Vaccines- Shingrix (2 of 2) 05/18/2022   HEMOGLOBIN A1C  06/19/2022   FOOT EXAM  07/13/2022   TETANUS/TDAP  12/18/2031   Pneumonia Vaccine 35+ Years old  Completed   DEXA SCAN  Completed   HPV VACCINES  Aged Out    Health Maintenance  Health Maintenance Due  Topic Date Due   COVID-19 Vaccine (5 - Pfizer risk series) 05/09/2021   OPHTHALMOLOGY EXAM  06/09/2021   INFLUENZA VACCINE  04/27/2022    Colorectal cancer screening: No longer required.   Mammogram status:  No longer required due to age.  Bone Density status: Completed 07/06/2021.   Lung Cancer Screening: (Low Dose CT Chest recommended if Age 81-80 years, 30 pack-year currently smoking OR have quit w/in 15years.) does not qualify.   Lung Cancer Screening Referral: no  Additional Screening:  Hepatitis C Screening: does not qualify;   Vision Screening: Recommended annual ophthalmology exams for early detection of glaucoma and other disorders of the eye. Is the patient up to date with their annual eye exam?  No  Who is the provider or what is the name of the office in which the patient attends annual eye exams? Dr. Katy Fitch If pt is not established with a provider, would they like to be referred to a provider to establish care? No .   Dental Screening: Recommended annual dental exams  for proper oral hygiene  Community Resource Referral / Chronic Care Management: CRR required this visit?  No   CCM required this visit?  No      Plan:     I have personally reviewed and noted the following in the patient's chart:   Medical and social history Use of alcohol, tobacco or illicit drugs  Current medications and supplements including opioid prescriptions.  Functional ability and status Nutritional status Physical activity Advanced directives List of other physicians Hospitalizations, surgeries, and ER visits in previous 12 months Vitals Screenings to include cognitive, depression, and falls Referrals and appointments  In addition, I have reviewed and discussed with patient certain preventive protocols, quality metrics, and best practice recommendations. A written personalized care plan for preventive services as well as general preventive health recommendations were provided to patient.     Kellie Simmering, LPN   0/00/5056   Nurse Notes: 6 CIT not administered. Patient has been diagnosed with dementia. She is followed by a neurologist.   Due to this being a virtual visit, the after visit summary with patients personalized plan was offered to patient via mail or my-chart. Patient would like to access on my-chart

## 2022-05-14 ENCOUNTER — Telehealth: Payer: Medicare Other

## 2022-05-19 ENCOUNTER — Other Ambulatory Visit: Payer: Self-pay | Admitting: Internal Medicine

## 2022-05-19 DIAGNOSIS — R630 Anorexia: Secondary | ICD-10-CM

## 2022-05-21 ENCOUNTER — Encounter (HOSPITAL_COMMUNITY)
Admission: RE | Admit: 2022-05-21 | Discharge: 2022-05-21 | Disposition: A | Discharge status: Home / Self Care | Payer: Medicare Other | Source: Ambulatory Visit | Attending: Nephrology | Admitting: Nephrology

## 2022-05-21 VITALS — BP 143/79 | HR 69 | Temp 97.6°F

## 2022-05-21 DIAGNOSIS — N1832 Chronic kidney disease, stage 3b: Secondary | ICD-10-CM

## 2022-05-21 MED ORDER — EPOETIN ALFA 20000 UNIT/ML IJ SOLN
INTRAMUSCULAR | Status: AC
  Administered 2022-05-21: 20000 [IU]
  Filled 2022-05-21: qty 1

## 2022-05-21 MED ORDER — EPOETIN ALFA 10000 UNIT/ML IJ SOLN: 10000.0000 [IU] | Freq: Once | INTRAMUSCULAR | Status: DC

## 2022-05-21 MED ORDER — EPOETIN ALFA-EPBX 40000 UNIT/ML IJ SOLN: 20000.0000 [IU] | Freq: Once | INTRAMUSCULAR | Status: DC

## 2022-05-21 MED ORDER — EPOETIN ALFA-EPBX 10000 UNIT/ML IJ SOLN: 10000.0000 [IU] | INTRAMUSCULAR | Status: DC

## 2022-05-24 ENCOUNTER — Telehealth: Payer: Self-pay | Admitting: Internal Medicine

## 2022-05-24 DIAGNOSIS — I10 Essential (primary) hypertension: Secondary | ICD-10-CM

## 2022-05-24 MED ORDER — METOPROLOL SUCCINATE ER 25 MG PO TB24: 25.0000 mg | ORAL_TABLET | Freq: Every day | ORAL | 11 refills | Status: DC

## 2022-05-24 NOTE — Telephone Encounter (Signed)
*  STAT* If patient is at the pharmacy, call can be transferred to refill team.   1. Which medications need to be refilled? (please list name of each medication and dose if known) metoprolol succinate (TOPROL-XL) 25 MG 24 hr tablet (Expired)  2. Which pharmacy/location (including street and city if local pharmacy) is medication to be sent to?  [facility] ([facility] - [place], [place]  3. Do they need a 30 day or 90 day supply? 71   Pt made a f/u for 06/08/22 with Dr. Harl Bowie

## 2022-05-26 ENCOUNTER — Ambulatory Visit (INDEPENDENT_AMBULATORY_CARE_PROVIDER_SITE_OTHER): Payer: Medicare Other | Admitting: Pulmonary Disease

## 2022-05-26 ENCOUNTER — Encounter: Payer: Self-pay | Admitting: Pulmonary Disease

## 2022-05-26 ENCOUNTER — Ambulatory Visit (INDEPENDENT_AMBULATORY_CARE_PROVIDER_SITE_OTHER): Payer: Medicare Other

## 2022-05-26 VITALS — BP 122/58 | HR 84 | Temp 98.5°F | Wt 111.0 lb

## 2022-05-26 DIAGNOSIS — R64 Cachexia: Secondary | ICD-10-CM | POA: Diagnosis not present

## 2022-05-26 DIAGNOSIS — Z87891 Personal history of nicotine dependence: Secondary | ICD-10-CM

## 2022-05-26 DIAGNOSIS — J449 Chronic obstructive pulmonary disease, unspecified: Secondary | ICD-10-CM | POA: Diagnosis not present

## 2022-05-26 DIAGNOSIS — N1831 Chronic kidney disease, stage 3a: Secondary | ICD-10-CM

## 2022-05-26 DIAGNOSIS — J90 Pleural effusion, not elsewhere classified: Secondary | ICD-10-CM | POA: Diagnosis not present

## 2022-05-26 DIAGNOSIS — J9811 Atelectasis: Secondary | ICD-10-CM | POA: Diagnosis not present

## 2022-05-26 DIAGNOSIS — E43 Unspecified severe protein-calorie malnutrition: Secondary | ICD-10-CM

## 2022-05-26 NOTE — Patient Instructions (Signed)
Thank you for visiting Dr. Valeta Harms at [facility]. Today we recommend the following:  Orders Placed This Encounter  Procedures   DG Chest 2 View   Return in about 6 months (around 11/25/2022) for with APP., Rexene Edison, NP     Please do your part to reduce the spread of COVID-19.

## 2022-05-26 NOTE — Progress Notes (Signed)
Synopsis: Referred in April 2021 for lung nodule by [name], FNP  Subjective:   PATIENT ID: Leslie Reeves GENDER: female DOB: 1941-07-09, MRN: [id number]  Chief Complaint  Patient presents with   Follow-up    Follow up for nodule. Pt states that her breathing has not changed since last visit. Pt still remains on oxygen at this time. Pt is on Adviar daily and she seems to have no issues. And albuterol as needed     This is a 81 year old female past medical history of diabetes and hypertension.  Referred for evaluation of lung nodule.  Patient underwent CT chest on 12/18/2019 which revealed a 3 mm noncalcified right middle lobe pulmonary nodule as well as aortic atherosclerosis and a thyroid goiter.  She is a former smoker, smoked for approximately 15 years only half pack per day.  Patient has no respiratory complaints today.  We reviewed her CT imaging today in the office.  She does state that she has had an asthma diagnosis for the past 20 years.  She uses as needed albuterol.  She has done well with this with no other symptoms.  She states that she has not used her albuterol this past month.  But on occasion she does have some issues and starting to become more apparent with the season change into springtime with more pollen..  OV 03/12/2021: shes on megace to help stimulate her appetitie. Currently using wixela. Breathing ok.  Feels short of breath with exertion.  Biggest issue at this moment seems to be ongoing weight loss.  She has an appointment with nutritionist soon.  CT scan of the chest was completed in April of this past year that had follow-up regarding nodule.  This is stable.  OV 02/01/2022: Here today for follow-up after recent CT chest.  CT chest revealed stable right middle lobe pulmonary nodule.  No additional follow-up needed.  Enlarged thyroid also stable.  Small pleural effusion.  These are noted bilaterally.  From respiratory standpoint doing okay.  Unfortunately has been  very short of breath with any exertion lays around most of the time.  Here today in a wheelchair.  She has been losing weight steadily.  Chest x-ray back in September shows left-sided effusion.  Repeat CT chest also shows larger left-sided effusion.  OV 05/26/2022:Here for follow-up.  In May was taken for thoracentesis.  She had follow-up after thoracentesis with TP, NP.  Pleural fluid was lymphocyte predominant.  Patient had follow-up chest x-ray which showed persistent bilateral effusions.  Cytology from pleural fluid analysis had inflammatory cells present no malignancy identified.  From respiratory standpoint she is doing okay.  Still on oxygen.  She is continuing to lose weight.  BMI is 20.  She does have outpatient palliative care following with her.  Daughter is concerned that her hearing is also getting worse.    Past Medical History:  Diagnosis Date   Allergy    Diabetes mellitus without complication ([facility])    Hypertension      Family History  Problem Relation Age of Onset   Heart disease Other    Stroke Other    Hypertension Other    COPD Other    Cancer Other    Asthma Other    Breast cancer Neg Hx      Past Surgical History:  Procedure Laterality Date   ABDOMINAL HYSTERECTOMY     THORACENTESIS Left 02/04/2022   Procedure: THORACENTESIS;  Surgeon: Candee Furbish, MD;  Location: [facility];  Service: Pulmonary;  Laterality: Left;    Social History   Socioeconomic History   Marital status: Married    Spouse name: Not on file   Number of children: 2   Years of education: Not on file   Highest education level: High school graduate  Occupational History   Occupation: retired  Tobacco Use   Smoking status: Former    Packs/day: 0.50    Years: 50.00    Total pack years: 25.00    Types: Cigarettes    Quit date: 2017    Years since quitting: 6.6   Smokeless tobacco: Never  Vaping Use   Vaping Use: Never used  Substance and Sexual Activity   Alcohol use: Not  Currently   Drug use: Not Currently   Sexual activity: Not Currently  Other Topics Concern   Not on file  Social History Narrative   Not on file   Social Determinants of Health   Financial Resource Strain: Low Risk  (05/12/2022)   Overall Financial Resource Strain (CARDIA)    Difficulty of Paying Living Expenses: Not hard at all  Food Insecurity: No Food Insecurity (05/12/2022)   Hunger Vital Sign    Worried About Running Out of Food in the Last Year: Never true    [place] in the Last Year: Never true  Transportation Needs: No Transportation Needs (05/12/2022)   PRAPARE - Hydrologist (Medical): No    Lack of Transportation (Non-Medical): No  Physical Activity: Inactive (05/12/2022)   Exercise Vital Sign    Days of Exercise per Week: 0 days    Minutes of Exercise per Session: 0 min  Stress: No Stress Concern Present (05/12/2022)   [place]    Feeling of Stress : Not at all  Social Connections: Not on file  Intimate Partner Violence: Not At Risk (03/21/2019)   Humiliation, Afraid, Rape, and Kick questionnaire    Fear of Current or Ex-Partner: No    Emotionally Abused: No    Physically Abused: No    Sexually Abused: No     Allergies  Allergen Reactions   Penicillin G Rash   Quinapril Other (See Comments)    Unknown   Penicillins Rash     Outpatient Medications Prior to Visit  Medication Sig Dispense Refill   Accu-Chek Softclix Lancets lancets Use as instructed 100 each 12   acetaminophen (TYLENOL) 325 MG tablet Take 2 tablets (650 mg total) by mouth every 6 (six) hours as needed for headache or mild pain.     albuterol (PROVENTIL HFA) 108 (90 Base) MCG/ACT inhaler Inhale 2 puffs into the lungs every 6 (six) hours as needed for wheezing or shortness of breath. 24 g 3   amLODipine (NORVASC) 10 MG tablet Take 1 tablet (10 mg total) by mouth daily. 30 tablet 11   blood  glucose meter kit and supplies KIT Dispense based on patient and insurance preference. Use up to four times daily as directed. (FOR ICD-9 250.00, 250.01). 1 each 0   diclofenac sodium (VOLTAREN) 1 % GEL Apply 2 g topically 4 (four) times daily. 100 g 1   fluticasone-salmeterol (WIXELA INHUB) 100-50 MCG/ACT AEPB Inhale 1 puff into the lungs 2 (two) times daily. 180 each 3   glucose blood (ACCU-CHEK AVIVA PLUS) test strip Use as instructed 300 each 3   isosorbide-hydrALAZINE (BIDIL) 20-37.5 MG tablet Take 1 tablet by mouth 3 (three) times daily. 30 tablet  0   levothyroxine (SYNTHROID) 112 MCG tablet TAKE 1 TABLET BY MOUTH DAILY 90 tablet 3   losartan (COZAAR) 25 MG tablet Take 25 mg by mouth daily.     memantine (NAMENDA) 10 MG tablet Take 1 tablet (10 mg total) by mouth daily. 90 tablet 3   metoprolol succinate (TOPROL-XL) 25 MG 24 hr tablet Take 1 tablet (25 mg total) by mouth daily. 30 tablet 11   mirtazapine (REMERON) 15 MG tablet Take 1 tablet (15 mg total) by mouth at bedtime. 30 tablet 22   rosuvastatin (CRESTOR) 20 MG tablet TAKE 1 TABLET BY MOUTH DAILY 100 tablet 2   No facility-administered medications prior to visit.    Review of Systems  Constitutional:  Positive for weight loss. Negative for chills, fever and malaise/fatigue.  HENT:  Negative for hearing loss, sore throat and tinnitus.   Eyes:  Negative for blurred vision and double vision.  Respiratory:  Positive for shortness of breath. Negative for cough, hemoptysis, sputum production, wheezing and stridor.   Cardiovascular:  Negative for chest pain, palpitations, orthopnea, leg swelling and PND.  Gastrointestinal:  Negative for abdominal pain, constipation, diarrhea, heartburn, nausea and vomiting.  Genitourinary:  Negative for dysuria, hematuria and urgency.  Musculoskeletal:  Negative for joint pain and myalgias.  Skin:  Negative for itching and rash.  Neurological:  Negative for dizziness, tingling, weakness and headaches.   Endo/Heme/Allergies:  Negative for environmental allergies. Does not bruise/bleed easily.  Psychiatric/Behavioral:  Negative for depression. The patient is not nervous/anxious and does not have insomnia.   All other systems reviewed and are negative.    Objective:  Physical Exam Vitals reviewed.  Constitutional:      General: She is not in acute distress.    Appearance: She is well-developed.  HENT:     Head: Normocephalic and atraumatic.     Mouth/Throat:     Pharynx: No oropharyngeal exudate.  Eyes:     Conjunctiva/sclera: Conjunctivae normal.     Pupils: Pupils are equal, round, and reactive to light.  Neck:     Vascular: No JVD.     Trachea: No tracheal deviation.     Comments: Loss of supraclavicular fat Cardiovascular:     Rate and Rhythm: Normal rate and regular rhythm.     Heart sounds: S1 normal and S2 normal.     Comments: Distant heart tones Pulmonary:     Effort: No tachypnea or accessory muscle usage.     Breath sounds: No stridor. Decreased breath sounds (throughout all lung fields) present. No wheezing, rhonchi or rales.     Comments: Diminished breath sounds bilateral bases Abdominal:     General: Bowel sounds are normal. There is no distension.     Palpations: Abdomen is soft.     Tenderness: There is no abdominal tenderness.  Musculoskeletal:        General: Deformity (muscle wasting ) present.  Skin:    General: Skin is warm and dry.     Capillary Refill: Capillary refill takes less than 2 seconds.     Findings: No rash.  Neurological:     Mental Status: She is alert and oriented to person, place, and time.  Psychiatric:        Behavior: Behavior normal.      Vitals:   05/26/22 0916  BP: (!) 122/58  Pulse: 84  Temp: 98.5 F (36.9 C)  TempSrc: Oral  SpO2: 93%  Weight: 111 lb (50.3 kg)     93% on  RA BMI Readings from Last 3 Encounters:  05/26/22 20.30 kg/m  05/12/22 20.30 kg/m  03/23/22 20.44 kg/m   Wt Readings from Last 3  Encounters:  05/26/22 111 lb (50.3 kg)  05/12/22 111 lb (50.3 kg)  02/15/22 108 lb 3.2 oz (49.1 kg)     CBC    Component Value Date/Time   WBC 8.4 07/29/2021 1227   WBC 10.1 07/23/2021 0621   RBC 3.56 (L) 07/29/2021 1227   RBC 2.93 (L) 07/23/2021 0621   HGB 7.9 (L) 05/07/2022 1102   HGB 10.2 (L) 07/29/2021 1227   HCT 32.0 (L) 07/29/2021 1227   PLT 444 07/29/2021 1227   MCV 90 07/29/2021 1227   MCH 28.7 07/29/2021 1227   MCH 29.0 07/23/2021 0621   MCHC 31.9 07/29/2021 1227   MCHC 32.6 07/23/2021 0621   RDW 18.6 (H) 07/29/2021 1227   LYMPHSABS 2.4 01/02/2020 1134   EOSABS 0.2 01/02/2020 1134   BASOSABS 0.1 01/02/2020 1134     Chest Imaging: 12/18/2019 CT chest: 3 mm noncalcified right middle lobe pulmonary nodule.  Pulmonary Functions Testing Results:     No data to display          FeNO: None  Pathology: None   Echocardiogram: None   Heart Catheterization: None     Assessment & Plan:     ICD-10-CM   1. Pleural effusion  J90 DG Chest 2 View    2. Protein-calorie malnutrition, severe  E43     3. Chronic obstructive pulmonary disease, unspecified COPD type ([place])  J44.9     4. Chronic kidney disease (CKD) stage G3a/A1, moderately decreased glomerular filtration rate (GFR) between 45-59 mL/min/1.73 square meter and albuminuria creatinine ratio less than 30 mg/g (H[facility])  N18.31     5. Cachectic ([place])  R64     6. Former smoker  Z87.891       Assessment:   This is an 81 year old female, history of right lung nodule, CT stable, former smoker 50+ years, severe centrilobular emphysema, ongoing weight loss cachexia, severe protein malnutrition, BMI 20.  Patient has recurrent bilateral pleural effusions, left worse than right status post thoracentesis, fluid analysis benign except for lymphocyte predominance which suggest chronicity.  Plan: We talked about the pros and cons of repeat procedures. I think with her other medical comorbidities that we can continue  to observe her pleural effusions. She does not seem to be bothered by them. She is not exceptionally physically active so I do not think that she would have any dyspnea related to this at this time. We will get a repeat chest x-ray just to make sure they are not getting excessively worse. We talked about whether or not we should continue to attempt drainage and or consideration for repeat cytology.  I did explain that her recurrent effusion that is lymphocyte predominant always carries a risk for having an underlying malignancy.  But she understands and family understands that it is probably best that we wait and see if it gets worse. There are chronic continue to follow-up with outpatient palliative care. I do expect at some point that she would need to consider transition to hospice care. She is going to follow-up with [place] in 6 months.  Repeat chest x-ray today prior to leaving.    Current Outpatient Medications:    Accu-Chek Softclix Lancets lancets, Use as instructed, Disp: 100 each, Rfl: 12   acetaminophen (TYLENOL) 325 MG tablet, Take 2 tablets (650 mg total) by mouth every 6 (six)  hours as needed for headache or mild pain., Disp: , Rfl:    albuterol (PROVENTIL HFA) 108 (90 Base) MCG/ACT inhaler, Inhale 2 puffs into the lungs every 6 (six) hours as needed for wheezing or shortness of breath., Disp: 24 g, Rfl: 3   amLODipine (NORVASC) 10 MG tablet, Take 1 tablet (10 mg total) by mouth daily., Disp: 30 tablet, Rfl: 11   blood glucose meter kit and supplies KIT, Dispense based on patient and insurance preference. Use up to four times daily as directed. (FOR ICD-9 250.00, 250.01)., Disp: 1 each, Rfl: 0   diclofenac sodium (VOLTAREN) 1 % GEL, Apply 2 g topically 4 (four) times daily., Disp: 100 g, Rfl: 1   fluticasone-salmeterol (WIXELA INHUB) 100-50 MCG/ACT AEPB, Inhale 1 puff into the lungs 2 (two) times daily., Disp: 180 each, Rfl: 3   glucose blood (ACCU-CHEK AVIVA PLUS) test strip, Use as  instructed, Disp: 300 each, Rfl: 3   isosorbide-hydrALAZINE (BIDIL) 20-37.5 MG tablet, Take 1 tablet by mouth 3 (three) times daily., Disp: 30 tablet, Rfl: 0   levothyroxine (SYNTHROID) 112 MCG tablet, TAKE 1 TABLET BY MOUTH DAILY, Disp: 90 tablet, Rfl: 3   losartan (COZAAR) 25 MG tablet, Take 25 mg by mouth daily., Disp: , Rfl:    memantine (NAMENDA) 10 MG tablet, Take 1 tablet (10 mg total) by mouth daily., Disp: 90 tablet, Rfl: 3   metoprolol succinate (TOPROL-XL) 25 MG 24 hr tablet, Take 1 tablet (25 mg total) by mouth daily., Disp: 30 tablet, Rfl: 11   mirtazapine (REMERON) 15 MG tablet, Take 1 tablet (15 mg total) by mouth at bedtime., Disp: 30 tablet, Rfl: 22   rosuvastatin (CRESTOR) 20 MG tablet, TAKE 1 TABLET BY MOUTH DAILY, Disp: 100 tablet, Rfl: 2   Garner Nash, DO [facility] 05/26/2022 9:28 AM

## 2022-05-27 ENCOUNTER — Other Ambulatory Visit: Payer: Medicare Other

## 2022-05-27 DIAGNOSIS — E039 Hypothyroidism, unspecified: Secondary | ICD-10-CM

## 2022-05-28 DIAGNOSIS — E43 Unspecified severe protein-calorie malnutrition: Secondary | ICD-10-CM | POA: Diagnosis not present

## 2022-05-28 LAB — TSH: TSH: 0.671 u[IU]/mL (ref 0.450–4.500)

## 2022-05-28 LAB — T3, FREE: T3, Free: 0.5 pg/mL — ABNORMAL LOW (ref 2.0–4.4)

## 2022-06-03 ENCOUNTER — Telehealth: Payer: Medicare Other | Admitting: Hospice

## 2022-06-04 ENCOUNTER — Encounter (HOSPITAL_COMMUNITY)
Admission: RE | Admit: 2022-06-04 | Discharge: 2022-06-04 | Disposition: A | Discharge status: Home / Self Care | Payer: Medicare Other | Source: Ambulatory Visit | Attending: Nephrology | Admitting: Nephrology

## 2022-06-04 ENCOUNTER — Telehealth: Payer: Self-pay | Admitting: Internal Medicine

## 2022-06-04 VITALS — BP 127/57 | HR 61 | Resp 18

## 2022-06-04 DIAGNOSIS — N1832 Chronic kidney disease, stage 3b: Secondary | ICD-10-CM | POA: Insufficient documentation

## 2022-06-04 LAB — IRON AND TIBC
Iron: 42 ug/dL (ref 28–170)
Saturation Ratios: 29 % (ref 10.4–31.8)
TIBC: 144 ug/dL — ABNORMAL LOW (ref 250–450)
UIBC: 102 ug/dL

## 2022-06-04 LAB — FERRITIN: Ferritin: 178 ng/mL (ref 11–307)

## 2022-06-04 MED ORDER — EPOETIN ALFA-EPBX 10000 UNIT/ML IJ SOLN
INTRAMUSCULAR | Status: AC
  Filled 2022-06-04: qty 2

## 2022-06-04 MED ORDER — EPOETIN ALFA-EPBX 10000 UNIT/ML IJ SOLN
20000.0000 [IU] | INTRAMUSCULAR | Status: DC
  Administered 2022-06-04: 20000 [IU] via SUBCUTANEOUS

## 2022-06-04 NOTE — Telephone Encounter (Signed)
Returned call to patients daughter (okay per [facility]) who was calling in regard to upcoming appointment on 9/12. Patients daughter states that patient does not like to leave home and states that she was told at last OV with Dr. Harl Bowie to just follow up as needed. Patients daughter states that patient has no symptoms and overall feels well. She also reports that patient has a palliative care appointment the following day and wanted to make Dr. Harl Bowie aware.   Per Last OV:   In order of problems listed above:   PRN follow up  Advised patients daughter okay to cancel follow up and to call back with any issues and can get patient rescheduled if needed. Patients daughter verbalized understanding. Appointment cancelled.

## 2022-06-04 NOTE — Telephone Encounter (Signed)
Patient's daughter states the patient has an appointment 9/12, but would like to know if the patient really needs to come in. She says at the last appointment they decided if the patient did not need to come in she could cancel it, but the pharmacy said she needs an appointment for a refill. She says it is getting mor difficult for the patient to come in.

## 2022-06-08 ENCOUNTER — Ambulatory Visit: Payer: Medicare Other | Admitting: Internal Medicine

## 2022-06-09 ENCOUNTER — Other Ambulatory Visit: Payer: Medicare Other | Admitting: *Deleted

## 2022-06-09 ENCOUNTER — Other Ambulatory Visit: Payer: Medicare Other

## 2022-06-09 VITALS — BP 140/62 | HR 68 | Temp 97.8°F | Resp 18

## 2022-06-09 DIAGNOSIS — Z515 Encounter for palliative care: Secondary | ICD-10-CM

## 2022-06-10 NOTE — Progress Notes (Signed)
COMMUNITY PALLIATIVE CARE SW NOTE  PATIENT NAME: MARKERIA GOETSCH DOB: [birth year] MRN: [id number]  PRIMARY CARE PROVIDER: [name], FNP  RESPONSIBLE PARTY:  Acct ID - Guarantor Home Phone Work Phone Relationship Acct Type  [id number] Hans [phone]  Self P/F     [address], [name], [place] [address]   SOCIAL WORK VISIT  SW and RN-M. Nadara Mustard completed a follow-up visit with patient at her home. She was present with her daughter-Sherbia. Patient was sitting in her recliner, in and out of sleep throughout the visit. She was wearing o2 (3L) via nasal canula. Patient responded to simple yes/no questions, but seemed to have difficulty staying awake. Her daughter provided a status update on patient. Her daughter advised that patient is total care and is having increased weakness and fatigue, where she is having difficulty sitting up. Patient is transported via wheelchair as she is having difficulty walking. Patient's appetite is poor as her daughter describes her only taking bites and sips of food and fluid. Patient can feed herself, but her daughter finds herself feeding her more now. No swallowing issues noted. Patient is sleeping most of the day and is only up when aroused. Patient is only having bowel movements 1-2x week as her daughter give her applesauce and Miralax as an intervention. Patient's most recent weight is 111 lbs. Her daughter feels that patient's weight has maintained since June. Patient has edema to her feet and abdomen. She continues to receive treatments every 2 weeks and her daughter desires to continue this treatment as long as she can get patient there. She also desires to maintain patient in the home.  Her daughter request assistance with obtaining POA/HCPOA paperwork. She would also like to obtain PCS application-SW to follow-up.    CODE STATUS: DNR ADVANCED DIRECTIVES: No MOST FORM COMPLETE:  No HOSPICE EDUCATION PROVIDED: NO  Duration of visit and  documentation: 60 minutes  Micah Galeno, LCSW

## 2022-06-10 NOTE — Progress Notes (Signed)
[facility] COMMUNITY PALLIATIVE CARE RN NOTE  PATIENT NAME: Leslie Reeves DOB: April 10, 1941 MRN: [id number]  PRIMARY CARE PROVIDER: [name], FNP  RESPONSIBLE PARTY: [name] (daughter) Acct ID - Guarantor Home Phone Work Phone Relationship Acct Type  [id number] VERNELLA, [name]  Self P/F     [place], [name], [name] [address]   RN/SW follow up visit completed with patient and daughter, [name] in their home.Upon arrival, patient is sitting up in her recliner asleep. Easily aroused, but sleepy. Sleeping more than usual.   Pain: Denies pain and no physical indicators of pain noted.  Respiratory: Experiences shortness of breath with exertion, even just simply being sat up on the side of the bed. O2 dependent at 3L/min via [place].   Cardiovascular: Trace edema noted to bilateral feet, abdomen and right arm.  Mobility: Increased weakness. Ambulatory with walker and 1 person assistance. Requires frequent rest periods. Total care will ADLs. Can feed herself, but sometimes daughter has to feed her.  Appetite: Decreased appetite. Best meal is breakfast but doesn't complete this meal as she used to. Offered 3 meals/day but often on takes a few bites, sips. Food doesn't taste good. Used to drink protein supplements but now refuses. No dysphagia and takes medications without difficulty. Has maintained weight, but feels this is due to edema.  GI: Daughter gives Miralax as needed, which helps. Not as many bowel movements per week most likely due to decreased intake and mobility  Goals of care: Daughter wants to keep patient at home for as long as possible. Daughter has opted for DNR. LCSW left 2 signed copies for patient in the home. Wishes for patient to continue Retacrit infusions every 2 weeks and does want blood transfusions if necessary.    CODE STATUS: DNR  ADVANCED DIRECTIVES: N MOST FORM: no PPS: 30%   PHYSICAL EXAM:   VITALS: Today's Vitals   06/09/22  1049  BP: (!) 140/62  Pulse: 68  Resp: 18  Temp: 97.8 F (36.6 C)  TempSrc: Temporal  SpO2: 97%  PainSc: 0-No pain    LUNGS: clear to auscultation  CARDIAC: Cor RRR EXTREMITIES: Trace edema in both feet, abdomen and right arm (stable) SKIN:  Exposed skin is dry and intact; Dtr denies skin issues   NEURO:  Alert and oriented to person/place, sleepy but arousable, HOH, increased generalized weakness, ambulatory w/walker and 1 person assistance     Daryl Eastern, RN BSN

## 2022-06-11 LAB — POCT HEMOGLOBIN-HEMACUE: Hemoglobin: 11 g/dL — ABNORMAL LOW (ref 12.0–15.0)

## 2022-06-18 ENCOUNTER — Encounter (HOSPITAL_COMMUNITY)
Admission: RE | Admit: 2022-06-18 | Discharge: 2022-06-18 | Disposition: A | Discharge status: Home / Self Care | Payer: Medicare Other | Source: Ambulatory Visit | Attending: Nephrology | Admitting: Nephrology

## 2022-06-18 VITALS — BP 171/62 | HR 65 | Temp 97.2°F

## 2022-06-18 DIAGNOSIS — N1832 Chronic kidney disease, stage 3b: Secondary | ICD-10-CM | POA: Diagnosis not present

## 2022-06-18 DIAGNOSIS — N184 Chronic kidney disease, stage 4 (severe): Secondary | ICD-10-CM | POA: Diagnosis not present

## 2022-06-18 LAB — POCT HEMOGLOBIN-HEMACUE: Hemoglobin: 11.9 g/dL — ABNORMAL LOW (ref 12.0–15.0)

## 2022-06-18 MED ORDER — EPOETIN ALFA-EPBX 10000 UNIT/ML IJ SOLN
INTRAMUSCULAR | Status: AC
  Filled 2022-06-18: qty 1

## 2022-06-18 MED ORDER — EPOETIN ALFA-EPBX 10000 UNIT/ML IJ SOLN
10000.0000 [IU] | INTRAMUSCULAR | Status: DC
  Administered 2022-06-18: 10000 [IU] via SUBCUTANEOUS

## 2022-06-24 ENCOUNTER — Other Ambulatory Visit: Payer: Self-pay

## 2022-06-24 ENCOUNTER — Observation Stay (HOSPITAL_COMMUNITY): Payer: Medicare Other

## 2022-06-24 ENCOUNTER — Emergency Department (HOSPITAL_COMMUNITY): Payer: Medicare Other

## 2022-06-24 ENCOUNTER — Other Ambulatory Visit: Payer: Medicare Other | Admitting: Primary Care

## 2022-06-24 ENCOUNTER — Observation Stay (HOSPITAL_COMMUNITY)
Admission: EM | Admit: 2022-06-24 | Discharge: 2022-06-25 | Disposition: A | Discharge status: Home / Self Care | Payer: Medicare Other | Attending: Family Medicine | Admitting: Family Medicine

## 2022-06-24 ENCOUNTER — Encounter (HOSPITAL_COMMUNITY): Payer: Self-pay

## 2022-06-24 DIAGNOSIS — E1122 Type 2 diabetes mellitus with diabetic chronic kidney disease: Secondary | ICD-10-CM | POA: Diagnosis not present

## 2022-06-24 DIAGNOSIS — Z20822 Contact with and (suspected) exposure to covid-19: Secondary | ICD-10-CM | POA: Diagnosis not present

## 2022-06-24 DIAGNOSIS — Z66 Do not resuscitate: Secondary | ICD-10-CM

## 2022-06-24 DIAGNOSIS — J9621 Acute and chronic respiratory failure with hypoxia: Secondary | ICD-10-CM

## 2022-06-24 DIAGNOSIS — R638 Other symptoms and signs concerning food and fluid intake: Secondary | ICD-10-CM

## 2022-06-24 DIAGNOSIS — Z9981 Dependence on supplemental oxygen: Secondary | ICD-10-CM | POA: Insufficient documentation

## 2022-06-24 DIAGNOSIS — Z87891 Personal history of nicotine dependence: Secondary | ICD-10-CM | POA: Diagnosis not present

## 2022-06-24 DIAGNOSIS — R627 Adult failure to thrive: Secondary | ICD-10-CM | POA: Diagnosis not present

## 2022-06-24 DIAGNOSIS — I130 Hypertensive heart and chronic kidney disease with heart failure and stage 1 through stage 4 chronic kidney disease, or unspecified chronic kidney disease: Secondary | ICD-10-CM | POA: Diagnosis not present

## 2022-06-24 DIAGNOSIS — I10 Essential (primary) hypertension: Secondary | ICD-10-CM | POA: Diagnosis not present

## 2022-06-24 DIAGNOSIS — J341 Cyst and mucocele of nose and nasal sinus: Secondary | ICD-10-CM | POA: Diagnosis not present

## 2022-06-24 DIAGNOSIS — R410 Disorientation, unspecified: Secondary | ICD-10-CM | POA: Diagnosis not present

## 2022-06-24 DIAGNOSIS — Z515 Encounter for palliative care: Secondary | ICD-10-CM | POA: Diagnosis not present

## 2022-06-24 DIAGNOSIS — E039 Hypothyroidism, unspecified: Secondary | ICD-10-CM | POA: Diagnosis present

## 2022-06-24 DIAGNOSIS — Z711 Person with feared health complaint in whom no diagnosis is made: Secondary | ICD-10-CM

## 2022-06-24 DIAGNOSIS — Z789 Other specified health status: Secondary | ICD-10-CM

## 2022-06-24 DIAGNOSIS — R0902 Hypoxemia: Secondary | ICD-10-CM | POA: Diagnosis not present

## 2022-06-24 DIAGNOSIS — R7989 Other specified abnormal findings of blood chemistry: Secondary | ICD-10-CM | POA: Diagnosis not present

## 2022-06-24 DIAGNOSIS — R600 Localized edema: Secondary | ICD-10-CM

## 2022-06-24 DIAGNOSIS — E119 Type 2 diabetes mellitus without complications: Secondary | ICD-10-CM

## 2022-06-24 DIAGNOSIS — S065XAA Traumatic subdural hemorrhage with loss of consciousness status unknown, initial encounter: Secondary | ICD-10-CM

## 2022-06-24 DIAGNOSIS — G9340 Encephalopathy, unspecified: Principal | ICD-10-CM | POA: Diagnosis present

## 2022-06-24 DIAGNOSIS — I509 Heart failure, unspecified: Secondary | ICD-10-CM | POA: Insufficient documentation

## 2022-06-24 DIAGNOSIS — Z79899 Other long term (current) drug therapy: Secondary | ICD-10-CM | POA: Insufficient documentation

## 2022-06-24 DIAGNOSIS — N179 Acute kidney failure, unspecified: Secondary | ICD-10-CM | POA: Diagnosis not present

## 2022-06-24 DIAGNOSIS — J449 Chronic obstructive pulmonary disease, unspecified: Secondary | ICD-10-CM | POA: Diagnosis present

## 2022-06-24 DIAGNOSIS — R400 Somnolence: Secondary | ICD-10-CM

## 2022-06-24 DIAGNOSIS — Z7189 Other specified counseling: Secondary | ICD-10-CM

## 2022-06-24 DIAGNOSIS — N1832 Chronic kidney disease, stage 3b: Secondary | ICD-10-CM | POA: Diagnosis not present

## 2022-06-24 DIAGNOSIS — R778 Other specified abnormalities of plasma proteins: Secondary | ICD-10-CM | POA: Diagnosis not present

## 2022-06-24 DIAGNOSIS — R4182 Altered mental status, unspecified: Secondary | ICD-10-CM | POA: Diagnosis present

## 2022-06-24 LAB — I-STAT VENOUS BLOOD GAS, ED
Acid-Base Excess: 0 mmol/L (ref 0.0–2.0)
Bicarbonate: 25.3 mmol/L (ref 20.0–28.0)
Calcium, Ion: 0.95 mmol/L — ABNORMAL LOW (ref 1.15–1.40)
HCT: 44 % (ref 36.0–46.0)
Hemoglobin: 15 g/dL (ref 12.0–15.0)
O2 Saturation: 100 %
Potassium: 4.4 mmol/L (ref 3.5–5.1)
Sodium: 141 mmol/L (ref 135–145)
TCO2: 27 mmol/L (ref 22–32)
pCO2, Ven: 41.5 mmHg — ABNORMAL LOW (ref 44–60)
pH, Ven: 7.393 (ref 7.25–7.43)
pO2, Ven: 221 mmHg — ABNORMAL HIGH (ref 32–45)

## 2022-06-24 LAB — TROPONIN I (HIGH SENSITIVITY)
Troponin I (High Sensitivity): 73 ng/L — ABNORMAL HIGH (ref <18)
Troponin I (High Sensitivity): 63 ng/L — ABNORMAL HIGH (ref <18)

## 2022-06-24 LAB — CBC WITH DIFFERENTIAL/PLATELET
Abs Immature Granulocytes: 0.01 10*3/uL (ref 0.00–0.07)
Basophils Absolute: 0 10*3/uL (ref 0.0–0.1)
Basophils Relative: 0 %
Eosinophils Absolute: 0 10*3/uL (ref 0.0–0.5)
Eosinophils Relative: 0 %
HCT: 46.3 % — ABNORMAL HIGH (ref 36.0–46.0)
Hemoglobin: 13.6 g/dL (ref 12.0–15.0)
Immature Granulocytes: 0 %
Lymphocytes Relative: 11 %
Lymphs Abs: 0.5 10*3/uL — ABNORMAL LOW (ref 0.7–4.0)
MCH: 29.7 pg (ref 26.0–34.0)
MCHC: 29.4 g/dL — ABNORMAL LOW (ref 30.0–36.0)
MCV: 101.1 fL — ABNORMAL HIGH (ref 80.0–100.0)
Monocytes Absolute: 0.2 10*3/uL (ref 0.1–1.0)
Monocytes Relative: 5 %
Neutro Abs: 4.2 10*3/uL (ref 1.7–7.7)
Neutrophils Relative %: 84 %
Platelets: 251 10*3/uL (ref 150–400)
RBC: 4.58 MIL/uL (ref 3.87–5.11)
RDW: 17.3 % — ABNORMAL HIGH (ref 11.5–15.5)
WBC: 5 10*3/uL (ref 4.0–10.5)
nRBC: 1.2 % — ABNORMAL HIGH (ref 0.0–0.2)

## 2022-06-24 LAB — URINALYSIS, ROUTINE W REFLEX MICROSCOPIC
Bilirubin Urine: NEGATIVE
Glucose, UA: NEGATIVE mg/dL
Hgb urine dipstick: NEGATIVE
Ketones, ur: NEGATIVE mg/dL
Leukocytes,Ua: NEGATIVE
Nitrite: NEGATIVE
Protein, ur: >300 mg/dL — AB
Specific Gravity, Urine: >1.03 — ABNORMAL HIGH (ref 1.005–1.030)
pH: 6 (ref 5.0–8.0)

## 2022-06-24 LAB — RESP PANEL BY RT-PCR (FLU A&B, COVID) ARPGX2
Influenza A by PCR: NEGATIVE
Influenza B by PCR: NEGATIVE
SARS Coronavirus 2 by RT PCR: NEGATIVE

## 2022-06-24 LAB — I-STAT CHEM 8, ED
BUN: 64 mg/dL — ABNORMAL HIGH (ref 8–23)
Calcium, Ion: 0.94 mmol/L — ABNORMAL LOW (ref 1.15–1.40)
Chloride: 113 mmol/L — ABNORMAL HIGH (ref 98–111)
Creatinine, Ser: 3.1 mg/dL — ABNORMAL HIGH (ref 0.44–1.00)
Glucose, Bld: 91 mg/dL (ref 70–99)
HCT: 42 % (ref 36.0–46.0)
Hemoglobin: 14.3 g/dL (ref 12.0–15.0)
Potassium: 4.4 mmol/L (ref 3.5–5.1)
Sodium: 143 mmol/L (ref 135–145)
TCO2: 25 mmol/L (ref 22–32)

## 2022-06-24 LAB — BASIC METABOLIC PANEL
Anion gap: 9 (ref 5–15)
BUN: 56 mg/dL — ABNORMAL HIGH (ref 8–23)
CO2: 23 mmol/L (ref 22–32)
Calcium: 8.5 mg/dL — ABNORMAL LOW (ref 8.9–10.3)
Chloride: 112 mmol/L — ABNORMAL HIGH (ref 98–111)
Creatinine, Ser: 2.85 mg/dL — ABNORMAL HIGH (ref 0.44–1.00)
GFR, Estimated: 16 mL/min — ABNORMAL LOW (ref >60)
Glucose, Bld: 94 mg/dL (ref 70–99)
Potassium: 4.6 mmol/L (ref 3.5–5.1)
Sodium: 144 mmol/L (ref 135–145)

## 2022-06-24 LAB — URINALYSIS, MICROSCOPIC (REFLEX)

## 2022-06-24 LAB — CBG MONITORING, ED: Glucose-Capillary: 88 mg/dL (ref 70–99)

## 2022-06-24 LAB — RAPID URINE DRUG SCREEN, HOSP PERFORMED
Amphetamines: NOT DETECTED
Barbiturates: NOT DETECTED
Benzodiazepines: NOT DETECTED
Cocaine: NOT DETECTED
Opiates: NOT DETECTED
Tetrahydrocannabinol: NOT DETECTED

## 2022-06-24 LAB — D-DIMER, QUANTITATIVE: D-Dimer, Quant: 8.97 ug/mL-FEU — ABNORMAL HIGH (ref 0.00–0.50)

## 2022-06-24 LAB — AMMONIA: Ammonia: 27 umol/L (ref 9–35)

## 2022-06-24 LAB — ETHANOL: Alcohol, Ethyl (B): <10 mg/dL (ref <10)

## 2022-06-24 LAB — MAGNESIUM: Magnesium: 2.2 mg/dL (ref 1.7–2.4)

## 2022-06-24 LAB — TSH: TSH: 0.702 u[IU]/mL (ref 0.350–4.500)

## 2022-06-24 LAB — BRAIN NATRIURETIC PEPTIDE: B Natriuretic Peptide: 300.7 pg/mL — ABNORMAL HIGH (ref 0.0–100.0)

## 2022-06-24 MED ORDER — ROSUVASTATIN CALCIUM 20 MG PO TABS
20.0000 mg | ORAL_TABLET | Freq: Every day | ORAL | Status: DC
  Administered 2022-06-25: 20 mg via ORAL
  Filled 2022-06-24: qty 1

## 2022-06-24 MED ORDER — METOPROLOL SUCCINATE ER 25 MG PO TB24: 25.0000 mg | ORAL_TABLET | Freq: Every day | ORAL | Status: DC

## 2022-06-24 MED ORDER — CALCIUM GLUCONATE-NACL 1-0.675 GM/50ML-% IV SOLN
1.0000 g | Freq: Once | INTRAVENOUS | Status: AC
  Administered 2022-06-24: 1000 mg via INTRAVENOUS
  Filled 2022-06-24: qty 50

## 2022-06-24 MED ORDER — AMLODIPINE BESYLATE 5 MG PO TABS
10.0000 mg | ORAL_TABLET | Freq: Every day | ORAL | Status: DC
  Administered 2022-06-25: 10 mg via ORAL
  Filled 2022-06-24: qty 2

## 2022-06-24 MED ORDER — ENOXAPARIN SODIUM 30 MG/0.3ML IJ SOSY: 30.0000 mg | PREFILLED_SYRINGE | INTRAMUSCULAR | Status: DC

## 2022-06-24 MED ORDER — SODIUM CHLORIDE 0.9 % IV SOLN: INTRAVENOUS | Status: AC

## 2022-06-24 MED ORDER — FLUTICASONE FUROATE-VILANTEROL 100-25 MCG/ACT IN AEPB
1.0000 | INHALATION_SPRAY | Freq: Every day | RESPIRATORY_TRACT | Status: DC
  Filled 2022-06-24: qty 28

## 2022-06-24 MED ORDER — ALBUTEROL SULFATE (2.5 MG/3ML) 0.083% IN NEBU: 2.5000 mg | INHALATION_SOLUTION | Freq: Four times a day (QID) | RESPIRATORY_TRACT | Status: DC | PRN

## 2022-06-24 MED ORDER — ACETAMINOPHEN 650 MG RE SUPP: 650.0000 mg | Freq: Four times a day (QID) | RECTAL | Status: DC | PRN

## 2022-06-24 MED ORDER — LEVOTHYROXINE SODIUM 112 MCG PO TABS
112.0000 ug | ORAL_TABLET | Freq: Every day | ORAL | Status: DC
  Administered 2022-06-25: 112 ug via ORAL
  Filled 2022-06-24: qty 1

## 2022-06-24 MED ORDER — MEMANTINE HCL 10 MG PO TABS
10.0000 mg | ORAL_TABLET | Freq: Every day | ORAL | Status: DC
  Administered 2022-06-25: 10 mg via ORAL
  Filled 2022-06-24: qty 1

## 2022-06-24 MED ORDER — ACETAMINOPHEN 325 MG PO TABS: 650.0000 mg | ORAL_TABLET | Freq: Four times a day (QID) | ORAL | Status: DC | PRN

## 2022-06-24 MED ORDER — MIRTAZAPINE 15 MG PO TABS: 15.0000 mg | ORAL_TABLET | Freq: Every day | ORAL | Status: DC

## 2022-06-24 NOTE — Assessment & Plan Note (Signed)
TSH wnl Continue home synthroid at 137mg daily

## 2022-06-24 NOTE — Assessment & Plan Note (Addendum)
Baseline creatinine appears to be around 2.20, up to 2.85 today Likely pre renal in setting of poor PO intake/FTT Gentle, time limited IVF Strict I/O Hold/avoid nephrotoxic drugs Check urine studies Trend

## 2022-06-24 NOTE — H&P (Signed)
History and Physical    Patient: Leslie Reeves OZH:[id number] DOB: 01-25-1941 DOA: 06/24/2022 DOS: the patient was seen and examined on 06/24/2022 PCP: [name], [place]  Patient coming from: Home - lives with her daughter. Bed bound.    Chief Complaint: AMS  HPI: Leslie Reeves is a 81 y.o. female with medical history significant of HTN, hypothyroidism, T2DM, CKD stage 3, COPD with chronic respiratory failure on 3L oxygen who presents today for AMS that started this AM. Her daughter gives history.  She states she was getting her ready this morning and seemed different and out of it this morning. She was really drowsy and couldn't assist with getting ready like she normally does.  Sh was falling asleep and drooling while talking to her daughter. She denies any focal deficits. She has global weakness, but daughter states her right side has become weaker, but this is not an acute finding.   No recent illness or fevers. No diarrhea, coughing or urinary complaints. No sick contacts    She does not smoke (past history), no alcohol.   ER Course:  vitals: afebrile, bp: 126/46, HR: 62, RR: 22, oxygen: 86%RA>88%3L>92%6L>3L  Pertinent labs: BUN: 56, creatinine: 2.85, troponin 73> D-dimer: 8.97, calcium: 8.5  CXR: mild bibasilar subsegmental atelectasis or edema is noted with small pleural effusions  CT head: no acute finding. Chronic infarct in left basal ganglia.  In ED: given calcium. TRH asked to admit.    Review of Systems: unable to review all systems due to the inability of the patient to answer questions. Past Medical History:  Diagnosis Date   Allergy    Diabetes mellitus without complication ([place])    Hypertension    Past Surgical History:  Procedure Laterality Date   ABDOMINAL HYSTERECTOMY     THORACENTESIS Left 02/04/2022   Procedure: THORACENTESIS;  Surgeon: Candee Furbish, MD;  Location: [facility] ENDOSCOPY;  Service: Pulmonary;  Laterality: Left;   Social History:  reports that she  quit smoking about 6 years ago. Her smoking use included cigarettes. She has a 25.00 pack-year smoking history. She has never used smokeless tobacco. She reports that she does not currently use alcohol. She reports that she does not currently use drugs.  Allergies  Allergen Reactions   Penicillin G Rash   Quinapril Other (See Comments)    Unknown   Penicillins Rash    Family History  Problem Relation Age of Onset   Heart disease Other    Stroke Other    Hypertension Other    COPD Other    Cancer Other    Asthma Other    Breast cancer Neg Hx     Prior to Admission medications   Medication Sig Start Date End Date Taking? Authorizing Provider  acetaminophen (TYLENOL) 325 MG tablet Take 2 tablets (650 mg total) by mouth every 6 (six) hours as needed for headache or mild pain. 07/24/21  Yes Mercy Riding, MD  albuterol (PROVENTIL HFA) 108 (90 Base) MCG/ACT inhaler Inhale 2 puffs into the lungs every 6 (six) hours as needed for wheezing or shortness of breath. Patient taking differently: Inhale 2 puffs into the lungs daily. 01/12/22  Yes Icard, Bradley L, DO  amLODipine (NORVASC) 10 MG tablet Take 1 tablet (10 mg total) by mouth daily. 12/02/21 12/02/22 Yes Glendale Chard, MD  Cyanocobalamin (VITAMIN B-12 PO) Take 1 tablet by mouth daily.   Yes [provider]  fluticasone-salmeterol (WIXELA INHUB) 100-50 MCG/ACT AEPB Inhale 1 puff into the lungs  2 (two) times daily. 12/04/21  Yes Icard, Bradley L, DO  isosorbide-hydrALAZINE (BIDIL) 20-37.5 MG tablet Take 1 tablet by mouth 3 (three) times daily. 06/13/21  Yes Debbe Odea, MD  levothyroxine (SYNTHROID) 112 MCG tablet TAKE 1 TABLET BY MOUTH DAILY Patient taking differently: Take 112 mcg by mouth daily. 03/04/22  Yes [name], FNP  losartan (COZAAR) 25 MG tablet Take 25 mg by mouth daily. 01/12/22  Yes [provider]  memantine (NAMENDA) 10 MG tablet Take 1 tablet (10 mg total) by mouth daily. 12/03/21  Yes [name], MD   metoprolol succinate (TOPROL-XL) 25 MG 24 hr tablet Take 1 tablet (25 mg total) by mouth daily. 05/24/22  Yes [name], MD  mirtazapine (REMERON) 15 MG tablet Take 1 tablet (15 mg total) by mouth at bedtime. 05/19/22  Yes Glendale Chard, MD  rosuvastatin (CRESTOR) 20 MG tablet TAKE 1 TABLET BY MOUTH DAILY Patient taking differently: Take 20 mg by mouth daily. 02/15/22  Yes Glendale Chard, MD  Accu-Chek Softclix Lancets lancets Use as instructed 03/04/21   [name], FNP  blood glucose meter kit and supplies KIT Dispense based on patient and insurance preference. Use up to four times daily as directed. (FOR ICD-9 250.00, 250.01). 11/23/18   [name], FNP  glucose blood (ACCU-CHEK AVIVA PLUS) test strip Use as instructed 11/27/18   [name], FNP    Physical Exam: Vitals:   06/24/22 1500 06/24/22 1528 06/24/22 1700 06/24/22 1800  BP: (!) 144/55  (!) 158/58 (!) 157/55  Pulse: (!) 51   (!) 55  Resp: _0 Temp:  97.6 F (36.4 C)    TempSrc:  Oral    SpO2: 100%   98%  Weight:      Height:       General:  Appears calm and comfortable and is in NAD. Sleeping, will open eyes, but doesn't converse. Cachetic  Eyes:  PERRL, EOMI, normal lids, iris ENT:  grossly normal hearing, lips & tongue, mmm; dentures Neck:  no LAD, masses or thyromegaly; no carotid bruits Cardiovascular:  RRR, no m/r/g. 3+ bilateral pitting edema. R>L Respiratory:   CTA bilaterally with no wheezes/rales/rhonchi.  Normal respiratory effort. Abdomen:  soft, NT, ND, NABS Back:   normal alignment, no CVAT Skin:  no rash or induration seen on limited exam. +skin tenting.  Musculoskeletal:  does not participate or move extremities. 3+ edema in RUE.  Lower extremity:    Limited foot exam with no ulcerations.  2+ distal pulses. Psychiatric: somnolent. Will open eyes and smile, but goes back to sleep.  Neurologic:  CN 2-12 grossly intact. Will not participate in exam.   Radiological Exams on  Admission: Independently reviewed - see discussion in A/P where applicable  MR BRAIN WO CONTRAST  Result Date: 06/24/2022 CLINICAL DATA:  Altered mental status, stroke suspected EXAM: MRI HEAD WITHOUT CONTRAST TECHNIQUE: Multiplanar, multiecho pulse sequences of the brain and surrounding structures were obtained without intravenous contrast. COMPARISON:  No prior MRI, correlation is made with CT head 06/24/2022 FINDINGS: Brain: Trace subdural collection along the right parietal and occipital lobe, which measures up to 2 mm (series 6, image 17). No significant mass effect related to the collection. The collection is T1 and T2 hyperintense, most likely late subacute hemorrhage. No restricted diffusion to suggest acute or subacute infarct. No acute parenchymal hemorrhage, mass, mass effect, or midline shift. No hydrocephalus. Remote infarct in the left lentiform nucleus. T2 hyperintense signal in the periventricular white  matter and pons, likely the sequela of moderate chronic small vessel ischemic disease. Cerebral atrophy is likely within normal limits for age. Vascular: Normal arterial flow voids. Skull and upper cervical spine: Normal marrow signal. Sinuses/Orbits: Mucous retention cyst in the left sphenoid sinus. Status post bilateral lens replacements. Other: Fluid throughout the bilateral mastoid air cells. IMPRESSION: 1. Trace subdural collection along the right parietal and occipital lobe, which measures up to 2 mm, most likely late subacute hemorrhage. No significant mass effect related to the collection. 2. No evidence of acute or subacute infarct. Electronically Signed   By: Merilyn Baba M.D.   On: 06/24/2022 19:34   CT Head Wo Contrast  Result Date: 06/24/2022 CLINICAL DATA:  Mental status change, unknown cause EXAM: CT HEAD WITHOUT CONTRAST TECHNIQUE: Contiguous axial images were obtained from the base of the skull through the vertex without intravenous contrast. RADIATION DOSE REDUCTION: This  exam was performed according to the departmental dose-optimization program which includes automated exposure control, adjustment of the mA and/or kV according to patient size and/or use of iterative reconstruction technique. COMPARISON:  Head CT 02/18/2021 FINDINGS: Brain: Nose of acute intracranial hemorrhage or abnormal extra-axial collection. Unchanged old left basal ganglia and periventricular white matter infarct.Patent basal cisterns.The ventricles are unchanged in size.Unchanged chronic small vessel ischemic disease. Vascular: Vascular calcifications. Skull: Negative for skull fracture. Sinuses/Orbits: There are bilateral mastoid effusions, new from prior, with small amount of middle ear fluid. No evidence of bony destruction. The paranasal sinuses are predominantly clear, with minimal mucosal thickening in the sphenoid, ethmoid air cell, and frontal sinuses. The orbits are unremarkable. Other: Unchanged calcified scalp lesions. IMPRESSION: No acute intracranial abnormality. Unchanged chronic infarct in the left basal ganglia and periventricular white matter. Electronically Signed   By: Maurine Simmering M.D.   On: 06/24/2022 12:27   DG Chest Portable 1 View  Result Date: 06/24/2022 CLINICAL DATA:  Hypoxia. EXAM: PORTABLE CHEST 1 VIEW COMPARISON:  May 26, 2022. FINDINGS: Stable cardiomediastinal silhouette. Mild bibasilar subsegmental atelectasis or edema is noted with small pleural effusions. Bony thorax is unremarkable. IMPRESSION: Mild bibasilar subsegmental atelectasis or edema is noted with small pleural effusions. Aortic Atherosclerosis (ICD10-I70.0). Electronically Signed   By: Marijo Conception M.D.   On: 06/24/2022 11:57    EKG: Independently reviewed.  Sinus bradycardia with rate 58; nonspecific ST changes with no evidence of acute ischemia   Labs on Admission: I have personally reviewed the available labs and imaging studies at the time of the admission.  Pertinent labs:    BUN: 56,   creatinine: 2.85,  troponin 73> D-dimer: 8.97,  calcium: 8.5   Assessment and Plan: Principal Problem:   Acute encephalopathy Active Problems:   Elevated troponin   Acute renal failure superimposed on stage 3b chronic kidney disease (HCC)   Acute on chronic respiratory failure with hypoxia (HCC)   Edema of right upper arm   Elevated d-dimer   FTT (failure to thrive) in adult   Essential hypertension   DM2 (diabetes mellitus, type 2) (HCC)   Hypothyroidism   COPD (chronic obstructive pulmonary disease) (HCC)    Assessment and Plan: * Acute encephalopathy 81 year old presenting with acute onset of AMS from baseline of memory loss with increased somnolence -obs to telemetry -head CT with no acute findings -initial troponin elevated, delta pending. No chest pain or changes on EKG to suspect ACS -infectious work up with no significant findings, waiting on UA/culture ordered -neuro checks, brain MRI ordered  -she  does have acute on CKD, but BUN not far from baseline and not significant uremia -gentle IVF -hold metoprolol with bradycardia  -NPO until more alert, swallow screen ordered -FTT with decline over past few months. Palliative consult for possible hospice if she doesn't improve/start to eat    Elevated troponin Initial troponin 73, repeat pending.  Likely demand ischemia in setting of acute on chronic respiratory failure  EKG with not acute changes and no complaints of chest pain, but she is altered    Acute renal failure superimposed on stage 3b chronic kidney disease (HCC) Baseline creatinine appears to be around 2.20, up to 2.85 today Likely pre renal in setting of poor PO intake/FTT Gentle, time limited IVF Strict I/O Hold/avoid nephrotoxic drugs Check urine studies Trend   Edema of right upper arm Check right UE doppler   Acute on chronic respiratory failure with hypoxia (HCC) Questionable acute on chronic respiratory failure Per EDP was put on 6L, but  has been back to her baseline of 3L ever since he has seen her been doing fine She has OSA and does drop her sats while sleeping  He did order a d-dimer which was elevated at 8.97 disucssed with family and we will start with LE DVT dopplers  Elevated d-dimer D-dimer >8; however, she has no tachycardia or increased oxygen demands Will check LE dopplers with edema and discussed with daughter VQ scan, but will start with dopplers first as no clinical indication of PE at this time.   FTT (failure to thrive) in adult Palliative consult   Essential hypertension Blood pressure initially soft to normal Continue home norvasc Holding toprol with bradycardia/increased somnolence  Holding cozaar with AKI on CKD Hold bidil with soft readings   DM2 (diabetes mellitus, type 2) (HCC) A1C of 4.3 in 11/2021 No SSI at this point   Hypothyroidism TSH wnl Continue home synthroid at 160mg daily   COPD (chronic obstructive pulmonary disease) (HCC) No evidence of exacerbation Continue advair and SABA prn      Advance Care Planning:   Code Status: DNR   Consults: palliative care   DVT Prophylaxis: lovenox   Family Communication: daughter at bedside: SMargaretha Sheffield  Severity of Illness: The appropriate patient status for this patient is OBSERVATION. Observation status is judged to be reasonable and necessary in order to provide the required intensity of service to ensure the patient's safety. The patient's presenting symptoms, physical exam findings, and initial radiographic and laboratory data in the context of their medical condition is felt to place them at decreased risk for further clinical deterioration. Furthermore, it is anticipated that the patient will be medically stable for discharge from the hospital within 2 midnights of admission.   Author: AOrma Flaming MD 06/24/2022 7:38 PM  For on call review [email or web address]

## 2022-06-24 NOTE — ED Provider Triage Note (Signed)
Emergency Medicine Provider Triage Evaluation Note  Leslie Reeves , a 81 y.o. female  was evaluated in triage.  Pt complains of altered mental status, dyspnea.  She does have history of likely malignant pleural effusions that were recently drained.  Daughter who is with patient states this morning patient was altered prior to her appointment with nephrologist.  On review of recent pulmonologist visit she had recurrent pleural effusions and the decision was made to observe and see if they get worse rather than repeat thoracentesis.  In triage patient is hypoxic at about 88% on 3 L.  Improved to mid to low 90s on 6 L.  Review of Systems  Positive: As above Negative: As above  Physical Exam  BP (!) 109/48   Pulse (!) 59   Temp 97.6 F (36.4 C) (Oral)   Resp (!) 26   SpO2 92%  Gen:   Somnolent and chronically ill-appearing but responds to verbal stimuli and follows commands.  Without acute distress Resp:  Normal effort MSK:   Moves extremities without difficulty  Other:    Medical Decision Making  Medically screening exam initiated at 11:15 AM.  Appropriate orders placed.  Leslie Reeves was informed that the remainder of the evaluation will be completed by another provider, this initial triage assessment does not replace that evaluation, and the importance of remaining in the ED until their evaluation is complete.     Evlyn Courier, PA-C 06/24/22 1118

## 2022-06-24 NOTE — Assessment & Plan Note (Signed)
Initial troponin 73, repeat pending.  Likely demand ischemia in setting of acute on chronic respiratory failure  EKG with not acute changes and no complaints of chest pain, but she is altered

## 2022-06-24 NOTE — ED Notes (Signed)
Patient transported to MRI 

## 2022-06-24 NOTE — Assessment & Plan Note (Signed)
Palliative consult

## 2022-06-24 NOTE — Assessment & Plan Note (Addendum)
Questionable acute on chronic respiratory failure Per EDP was put on 6L, but has been back to her baseline of 3L ever since he has seen her been doing fine She has OSA and does drop her sats while sleeping  He did order a d-dimer which was elevated at 8.97 disucssed with family and we will start with LE DVT dopplers

## 2022-06-24 NOTE — Progress Notes (Signed)
PIV consult: current access is adequate for ordered medication. Labs per phlebotomy to preserve pt's vessels.

## 2022-06-24 NOTE — ED Provider Notes (Signed)
[facility] EMERGENCY DEPARTMENT Provider Note   CSN: [id number] Arrival date & time: 06/24/22  1058     History  Chief Complaint  Patient presents with   Altered Mental Status    Leslie Reeves is a 81 y.o. female.  81 year old female with a history of COPD on 3 L at baseline, heart failure with preserved ejection fraction and recurrent pleural effusions, CKD, hypothyroidism on levothyroxine, palliative care and bedbound who presents to the emergency department with altered mental status.  Patient is typically conversant but confused at baseline as to day and night and with reference to who she is talking to.  Patient's daughter reports that she has been bedbound since the beginning of the year.  Occasionally will get around the house with a walker with assistance from another individual but states that she has been declining over the past several days and has not done so recently.  Says that this morning when she went to get her up she was more drowsy and lethargic than usual.  Says that she has not had any recent illnesses aside from some congestion which she believes to be due to her mother's seasonal allergies.  No cough, chest pain, shortness of breath, fevers, dysuria, or frequency.   Currently lives at home with her daughter.  Has palliative care nurses that come to visit and provide intermittent help.    Past Medical History:  Diagnosis Date   Allergy    Diabetes mellitus without complication ([place])    Hypertension        Home Medications Prior to Admission medications   Medication Sig Start Date End Date Taking? Authorizing Provider  acetaminophen (TYLENOL) 325 MG tablet Take 2 tablets (650 mg total) by mouth every 6 (six) hours as needed for headache or mild pain. 07/24/21  Yes Mercy Riding, MD  albuterol (PROVENTIL HFA) 108 (90 Base) MCG/ACT inhaler Inhale 2 puffs into the lungs every 6 (six) hours as needed for wheezing or shortness of  breath. Patient taking differently: Inhale 2 puffs into the lungs daily. 01/12/22  Yes Icard, Bradley L, DO  amLODipine (NORVASC) 10 MG tablet Take 1 tablet (10 mg total) by mouth daily. 12/02/21 12/02/22 Yes Glendale Chard, MD  Cyanocobalamin (VITAMIN B-12 PO) Take 1 tablet by mouth daily.   Yes [provider]  fluticasone-salmeterol (WIXELA INHUB) 100-50 MCG/ACT AEPB Inhale 1 puff into the lungs 2 (two) times daily. 12/04/21  Yes Icard, Bradley L, DO  isosorbide-hydrALAZINE (BIDIL) 20-37.5 MG tablet Take 1 tablet by mouth 3 (three) times daily. 06/13/21  Yes Debbe Odea, MD  levothyroxine (SYNTHROID) 112 MCG tablet TAKE 1 TABLET BY MOUTH DAILY Patient taking differently: Take 112 mcg by mouth daily. 03/04/22  Yes [name], FNP  losartan (COZAAR) 25 MG tablet Take 25 mg by mouth daily. 01/12/22  Yes [provider]  memantine (NAMENDA) 10 MG tablet Take 1 tablet (10 mg total) by mouth daily. 12/03/21  Yes [name], MD  metoprolol succinate (TOPROL-XL) 25 MG 24 hr tablet Take 1 tablet (25 mg total) by mouth daily. 05/24/22  Yes [name], MD  mirtazapine (REMERON) 15 MG tablet Take 1 tablet (15 mg total) by mouth at bedtime. 05/19/22  Yes Glendale Chard, MD  rosuvastatin (CRESTOR) 20 MG tablet TAKE 1 TABLET BY MOUTH DAILY Patient taking differently: Take 20 mg by mouth daily. 02/15/22  Yes Glendale Chard, MD  Accu-Chek Softclix Lancets lancets Use as instructed 03/04/21   [name], [place]  blood glucose meter kit and supplies KIT Dispense based on patient and insurance preference. Use up to four times daily as directed. (FOR ICD-9 250.00, 250.01). 11/23/18   [name], FNP  glucose blood (ACCU-CHEK AVIVA PLUS) test strip Use as instructed 11/27/18   [name], FNP      Allergies    Penicillin g, Quinapril, and Penicillins    Review of Systems   Review of Systems  Physical Exam Updated Vital Signs BP (!) 154/50   Pulse (!) 53   Temp 97.9 F (36.6 C) (Oral)    Resp 12   Ht 5' 2" (1.575 m)   Wt 54.4 kg   SpO2 95%   BMI 21.95 kg/m   Physical Exam Vitals and nursing note reviewed.  Constitutional:      General: She is not in acute distress.    Appearance: She is well-developed.     Comments: Arouses to voice but drowsy.  Moving all 4 extremities.  No cranial nerve deficits noted.  Pupils 2 mm bilaterally.  HENT:     Head: Normocephalic and atraumatic.     Right Ear: External ear normal.     Left Ear: External ear normal.     Nose: Nose normal.  Eyes:     Extraocular Movements: Extraocular movements intact.     Conjunctiva/sclera: Conjunctivae normal.     Pupils: Pupils are equal, round, and reactive to light.  Cardiovascular:     Rate and Rhythm: Normal rate and regular rhythm.     Heart sounds: No murmur heard. Pulmonary:     Effort: No respiratory distress.     Breath sounds: No wheezing.     Comments: Exam limited 2/2 poor inspiratory effort.  Crackles bibasilarly. No significant accessory muscle use.  Abdominal:     General: Abdomen is flat. There is no distension.     Palpations: Abdomen is soft. There is no mass.     Tenderness: There is no abdominal tenderness. There is no guarding.  Musculoskeletal:        General: No swelling.     Cervical back: Normal range of motion and neck supple.     Right lower leg: No edema.     Left lower leg: No edema.     Comments: 2+ right upper extremity swelling at baseline per the patient's daughter.  Skin:    General: Skin is warm and dry.     Capillary Refill: Capillary refill takes less than 2 seconds.  Neurological:     Mental Status: Mental status is at baseline.     Sensory: No sensory deficit.     Comments: Alert and oriented to self, year, and place but not oriented to exact date.  Moves all 4 extremities equally with 3/5 strength. Globally weak  Psychiatric:        Mood and Affect: Mood normal.     ED Results / Procedures / Treatments   Labs (all labs ordered are listed, but  only abnormal results are displayed) Labs Reviewed  CBC WITH DIFFERENTIAL/PLATELET - Abnormal; Notable for the following components:      Result Value   HCT 46.3 (*)    MCV 101.1 (*)    MCHC 29.4 (*)    RDW 17.3 (*)    nRBC 1.2 (*)    Lymphs Abs 0.5 (*)    All other components within normal limits  BASIC METABOLIC PANEL - Abnormal; Notable for the following components:   Chloride 112 (*)  BUN 56 (*)    Creatinine, Ser 2.85 (*)    Calcium 8.5 (*)    GFR, Estimated 16 (*)    All other components within normal limits  URINALYSIS, ROUTINE W REFLEX MICROSCOPIC - Abnormal; Notable for the following components:   Specific Gravity, Urine >1.030 (*)    Protein, ur >300 (*)    All other components within normal limits  BRAIN NATRIURETIC PEPTIDE - Abnormal; Notable for the following components:   B Natriuretic Peptide 300.7 (*)    All other components within normal limits  D-DIMER, QUANTITATIVE - Abnormal; Notable for the following components:   D-Dimer, Quant 8.97 (*)    All other components within normal limits  URINALYSIS, MICROSCOPIC (REFLEX) - Abnormal; Notable for the following components:   Bacteria, UA FEW (*)    All other components within normal limits  BASIC METABOLIC PANEL - Abnormal; Notable for the following components:   Sodium 146 (*)    Chloride 118 (*)    Glucose, Bld 64 (*)    BUN 56 (*)    Creatinine, Ser 2.41 (*)    Calcium 7.0 (*)    GFR, Estimated 20 (*)    All other components within normal limits  CBC - Abnormal; Notable for the following components:   RBC 3.84 (*)    Hemoglobin 11.4 (*)    MCV 102.1 (*)    MCHC 29.1 (*)    RDW 17.2 (*)    nRBC 1.3 (*)    All other components within normal limits  I-STAT CHEM 8, ED - Abnormal; Notable for the following components:   Chloride 113 (*)    BUN 64 (*)    Creatinine, Ser 3.10 (*)    Calcium, Ion 0.94 (*)    All other components within normal limits  I-STAT VENOUS BLOOD GAS, ED - Abnormal; Notable for  the following components:   pCO2, Ven 41.5 (*)    pO2, Ven 221 (*)    Calcium, Ion 0.95 (*)    All other components within normal limits  TROPONIN I (HIGH SENSITIVITY) - Abnormal; Notable for the following components:   Troponin I (High Sensitivity) 73 (*)    All other components within normal limits  TROPONIN I (HIGH SENSITIVITY) - Abnormal; Notable for the following components:   Troponin I (High Sensitivity) 63 (*)    All other components within normal limits  RESP PANEL BY RT-PCR (FLU A&B, COVID) [id number]  URINE CULTURE  TSH  RAPID URINE DRUG SCREEN, HOSP PERFORMED  ETHANOL  AMMONIA  MAGNESIUM  SODIUM, URINE, RANDOM  CREATININE, URINE, RANDOM  VITAMIN B12  CBG MONITORING, ED    EKG EKG Interpretation  Date/Time:  Thursday June 24 2022 11:19:57 EDT Ventricular Rate:  58 PR Interval:  128 QRS Duration: 78 QT Interval:  442 QTC Calculation: 433 R Axis:   57 Text Interpretation: Interpretation limited secondary to artifact Sinus bradycardia Nonspecific T wave abnormality Abnormal ECG When compared with ECG of 23-Jul-2021 03:30, No significant change since last tracing Confirmed by Margaretmary Eddy [phone]) on 06/24/2022 11:29:33 AM  Radiology MR BRAIN WO CONTRAST  Result Date: 06/24/2022 CLINICAL DATA:  Altered mental status, stroke suspected EXAM: MRI HEAD WITHOUT CONTRAST TECHNIQUE: Multiplanar, multiecho pulse sequences of the brain and surrounding structures were obtained without intravenous contrast. COMPARISON:  No prior MRI, correlation is made with CT head 06/24/2022 FINDINGS: Brain: Trace subdural collection along the right parietal and occipital lobe, which measures up to 2 mm (series 6, image 17).  No significant mass effect related to the collection. The collection is T1 and T2 hyperintense, most likely late subacute hemorrhage. No restricted diffusion to suggest acute or subacute infarct. No acute parenchymal hemorrhage, mass, mass effect, or midline shift. No  hydrocephalus. Remote infarct in the left lentiform nucleus. T2 hyperintense signal in the periventricular white matter and pons, likely the sequela of moderate chronic small vessel ischemic disease. Cerebral atrophy is likely within normal limits for age. Vascular: Normal arterial flow voids. Skull and upper cervical spine: Normal marrow signal. Sinuses/Orbits: Mucous retention cyst in the left sphenoid sinus. Status post bilateral lens replacements. Other: Fluid throughout the bilateral mastoid air cells. IMPRESSION: 1. Trace subdural collection along the right parietal and occipital lobe, which measures up to 2 mm, most likely late subacute hemorrhage. No significant mass effect related to the collection. 2. No evidence of acute or subacute infarct. Electronically Signed   By: Merilyn Baba M.D.   On: 06/24/2022 19:34   CT Head Wo Contrast  Result Date: 06/24/2022 CLINICAL DATA:  Mental status change, unknown cause EXAM: CT HEAD WITHOUT CONTRAST TECHNIQUE: Contiguous axial images were obtained from the base of the skull through the vertex without intravenous contrast. RADIATION DOSE REDUCTION: This exam was performed according to the departmental dose-optimization program which includes automated exposure control, adjustment of the mA and/or kV according to patient size and/or use of iterative reconstruction technique. COMPARISON:  Head CT 02/18/2021 FINDINGS: Brain: Nose of acute intracranial hemorrhage or abnormal extra-axial collection. Unchanged old left basal ganglia and periventricular white matter infarct.Patent basal cisterns.The ventricles are unchanged in size.Unchanged chronic small vessel ischemic disease. Vascular: Vascular calcifications. Skull: Negative for skull fracture. Sinuses/Orbits: There are bilateral mastoid effusions, new from prior, with small amount of middle ear fluid. No evidence of bony destruction. The paranasal sinuses are predominantly clear, with minimal mucosal thickening in  the sphenoid, ethmoid air cell, and frontal sinuses. The orbits are unremarkable. Other: Unchanged calcified scalp lesions. IMPRESSION: No acute intracranial abnormality. Unchanged chronic infarct in the left basal ganglia and periventricular white matter. Electronically Signed   By: Maurine Simmering M.D.   On: 06/24/2022 12:27   DG Chest Portable 1 View  Result Date: 06/24/2022 CLINICAL DATA:  Hypoxia. EXAM: PORTABLE CHEST 1 VIEW COMPARISON:  May 26, 2022. FINDINGS: Stable cardiomediastinal silhouette. Mild bibasilar subsegmental atelectasis or edema is noted with small pleural effusions. Bony thorax is unremarkable. IMPRESSION: Mild bibasilar subsegmental atelectasis or edema is noted with small pleural effusions. Aortic Atherosclerosis (ICD10-I70.0). Electronically Signed   By: Marijo Conception M.D.   On: 06/24/2022 11:57    Procedures Procedures   Medications Ordered in ED Medications  enoxaparin (LOVENOX) injection 30 mg (30 mg Subcutaneous Not Given 06/25/22 0410)  0.9 %  sodium chloride infusion (0 mLs Intravenous Stopped 06/24/22 2327)  acetaminophen (TYLENOL) tablet 650 mg (has no administration in time range)    Or  acetaminophen (TYLENOL) suppository 650 mg (has no administration in time range)  amLODipine (NORVASC) tablet 10 mg (has no administration in time range)  rosuvastatin (CRESTOR) tablet 20 mg (20 mg Oral Not Given 06/24/22 1643)  memantine (NAMENDA) tablet 10 mg (has no administration in time range)  mirtazapine (REMERON) tablet 15 mg (15 mg Oral Not Given 06/24/22 2213)  levothyroxine (SYNTHROID) tablet 112 mcg (112 mcg Oral Given 06/25/22 0646)  albuterol (PROVENTIL) (2.5 MG/3ML) 0.083% nebulizer solution 2.5 mg (has no administration in time range)  fluticasone furoate-vilanterol (BREO ELLIPTA) 100-25 MCG/ACT 1 puff (1 puff Inhalation  Not Given 06/25/22 0730)  0.9 %  sodium chloride infusion ( Intravenous New Bag/Given 06/24/22 2327)  isosorbide-hydrALAZINE (BIDIL) 20-37.5 MG  per tablet 1 tablet (has no administration in time range)  calcium gluconate 1 g/ 50 mL sodium chloride IVPB (0 mg Intravenous Stopped 06/24/22 1450)    ED Course/ Medical Decision Making/ A&P Clinical Course as of 06/25/22 0828  Thu Jun 24, 2022  1155 Confirmed DNR/DNI with the patient's daughter. [RP]  1214 Creatinine(!): 3.10 Increase in baseline of 2.2. [RP]  1302 CT head reviewed and interpreted by me as showing no acute abnormality. [RP]  4166 Patient now on baseline 3 L nasal cannula.  Given the patient's renal function do not feel that she is a candidate for CTA.  Will discuss VQ scan with medicine but feel that this is less likely given her normal O2 sats at this time and lack of chest pain or shortness of breath. [RP]  1350 Spoke with Dr Rogers Blocker from internal medicine who will admit the patient.  She will discuss VQ scan with the patient's family along with goals of care for the patient. [RP]    Clinical Course User Index [RP] Fransico Meadow, MD                           Medical Decision Making Amount and/or Complexity of Data Reviewed Labs: ordered. Decision-making details documented in ED Course. Radiology: ordered.  Risk Prescription drug management. Decision regarding hospitalization.   KARSEN FELLOWS is a 81 year old female with a history of COPD on 3 L at baseline, heart failure with preserved ejection fraction and recurrent pleural effusions, CKD, hypothyroidism on levothyroxine, palliative care and bedbound who presents to the emergency department with altered mental status and hypoxia.  This patient presents to the ED for concern of complaints listed in HPI, this involves an extensive number of treatment options, and is a complaint that carries with it a high risk of complications and morbidity.   Initial Ddx:  Generalized weakness and encephalopathy, hypercapnia, medication side effect, ICH, stroke, urinary tract infection, pneumonia, MI, PE  MDM:  Patient was  initially requiring 6 L O2 on arrival but was quickly weaned to her baseline of 3 L without intervention.  Feel that she likely has encephalopathy with generalized weakness likely due to another etiology such as UTI or pneumonia.  Also possible that patient could be altered from hypercapnia but does not have any significant wheezing.  The patient's daughter states that she is not on narcotics and after reviewing her medications does not appear to have any other medications that would cause her to be this sedated.  ICH and stroke also on the differential but feel that they are less likely as she does not have any focal neurodeficits that her noted within the exam that she is able to perform at this time.  Will obtain chest x-ray to evaluate for pneumonia or pleural effusions that she has had previously.  We will also obtain blood work to evaluate for MI and pulmonary embolism.  Plan:  Labs Troponin D-dimer VBG Urinalysis Urine drug screen COVID/flu EKG Chest x-ray CT head  ED Summary:  The patient was reassessed and remained stable while in the emergency department.  Labs showed an AKI.  Chest x-ray showed effusions that were present but did not show evidence of a pneumonia.  CT head without abnormality.  Urinalysis without signs of UTI.  Her troponin was elevated at  70 but given the patient's DNR status did not feel that she would likely be a candidate for acute intervention so we will continue to trend.  Patient did have elevated D-dimer but given the fact that she was back to her baseline oxygen levels and that she had an AKI did not feel that CT PE was warranted at this time.  Did discuss with medicine about the possibility of a VQ scan which they will investigate while initiating goals of care discussion with the patient's daughter.  Patient was admitted to medicine for further management of her acute kidney injury and altered mental status.  Dispo: Admit to Floor   Additional history  obtained from daughter Records reviewed Outpatient Clinic Notes and Admission Notes The following labs were independently interpreted: Chemistry and Urinalysis and show  AKI with no signs of UTI I independently reviewed the following imaging with scope of interpretation limited to determining acute life threatening conditions related to emergency care: Chest x-ray and CT Head, which revealed no acute abnormality and small pleural effusion   I personally reviewed and interpreted cardiac monitoring: normal sinus rhythm  I personally reviewed and interpreted the pt's EKG: see above for interpretation  I have reviewed the patients home medications and made adjustments as needed  Final Clinical Impression(s) / ED Diagnoses Final diagnoses:  Somnolence  AKI (acute kidney injury) [facility])    Rx / DC Orders ED Discharge Orders     None         Fransico Meadow, MD 06/25/22 [phone]

## 2022-06-24 NOTE — Assessment & Plan Note (Signed)
D-dimer >8; however, she has no tachycardia or increased oxygen demands Will check LE dopplers with edema and discussed with daughter VQ scan, but will start with dopplers first as no clinical indication of PE at this time.

## 2022-06-24 NOTE — Assessment & Plan Note (Signed)
A1C of 4.3 in 11/2021 No SSI at this point

## 2022-06-24 NOTE — Assessment & Plan Note (Addendum)
81 year old presenting with acute onset of AMS from baseline of memory loss with increased somnolence -obs to telemetry -head CT with no acute findings -initial troponin elevated, delta pending. No chest pain or changes on EKG to suspect ACS -infectious work up with no significant findings, waiting on UA/culture ordered -neuro checks, brain MRI ordered  -she does have acute on CKD, but BUN not far from baseline and not significant uremia -gentle IVF -hold metoprolol with bradycardia  -NPO until more alert, swallow screen ordered -FTT with decline over past few months. Palliative consult for possible hospice if she doesn't improve/start to eat

## 2022-06-24 NOTE — Assessment & Plan Note (Signed)
No evidence of exacerbation Continue advair and SABA prn

## 2022-06-24 NOTE — ED Notes (Signed)
Per IV team RN they are unable to start an IV for needed blood work. This RN reiterated that blood work is not just needed but patient also may require other IV medications. Per IV team they will no start IV and this RN was told to contact phlebotomy. This RN made IV team RN aware that phlebotomy was already contacted twice and no one has come to bedside to obtain blood work. Admitting provider made aware of same.

## 2022-06-24 NOTE — ED Triage Notes (Signed)
Patient responsive to voice oriented to self and place but slow to answer, family reports she is not at baseline usually is in the bed but when she gets her ready she is more responsive.  Patient requires 3L all the time but now requiring 6L.  Mouth breathing noted.  Retractions. Edematous everywhere

## 2022-06-24 NOTE — Assessment & Plan Note (Addendum)
Blood pressure initially soft to normal Continue home norvasc Holding toprol with bradycardia/increased somnolence  Holding cozaar with AKI on CKD Hold bidil with soft readings

## 2022-06-24 NOTE — ED Notes (Signed)
This RN called phlebotomy at this time for ordered blood work due to patient being a difficult stick. Phlebotomy will come to bedside at this time.

## 2022-06-24 NOTE — Assessment & Plan Note (Signed)
Check right UE doppler

## 2022-06-24 NOTE — ED Notes (Signed)
Palliative care at bedside at this time to speak with patient's family.

## 2022-06-24 NOTE — Consult Note (Addendum)
Consultation Note Date: 06/24/2022   Patient Name: Leslie Reeves  DOB: 02-28-1941  MRN: [id number]  Age / Sex: 81 y.o., female  PCP: Leslie Reeves, [place] Referring Physician: Orma Flaming, MD  Reason for Consultation: Establishing goals of care, "possible hospice"  HPI/Patient Profile: 81 y.o. female  with past medical history of  HTN, hypothyroidism, T2DM, CKD stage 3, COPD with chronic respiratory failure on 3L oxygen presented to ED on 06/24/22 from home with family concerns of AMS. Patient was admitted on 06/24/2022 with acute encephalopathy, AKI on CKD 3b, acute on chronic respiratory failure with hypoxia, FTT.   Of note, patient was being followed by [facility] outpatient Palliative Care.  Clinical Assessment and Goals of Care: I have reviewed medical records including EPIC notes, labs, and imaging. Received report from primary RN - no acute concerns. RN reports patient is lethargic, not eating/drinking.   Went to visit patient at bedside - daughter/Sherbia and best friend/Barbara present. Patient was lying in bed asleep - she gentle moves to voice/gentle touch but does not open her eyes. She remained asleep for duration of visit. No signs or non-verbal gestures of pain or discomfort noted. No respiratory distress, increased work of breathing, or secretions noted. She is on 4L O2 [place].  Met with daughter/Sherbia and patient's best friend/Barbara  to discuss diagnosis, prognosis, GOC, EOL wishes, disposition, and options. [name] was ok for [name] to be present for conversation.  I introduced Palliative Medicine as specialized medical care for people living with serious illness. It focuses on providing relief from the symptoms and stress of a serious illness. The goal is to improve quality of life for both the patient and the family.  We discussed a brief life review of the patient as well as functional and  nutritional status. Patient is widowed - she has two daughters. Prior to hospitalization, patient was living in [place] [facility] with her. [name] was patient's primary caregiver. [name] tells me she has seen a gradual decline in patient's overall physical and mental condition since last summer. Last summer she was able to ambulate, was working with PT/OT. Patient told [name] that she no longer wanted to participate in physical therapy and services were discontinued. Now, patient is not able to stand and is bedbound. [name] reflects on patient's last visit to the cardiologist - patient told physician that she would prefer to not continue regular follow up appointments and would like to be comfortable at home. [name] describes the patient's appetite as poor at home.  We discussed patient's current illness and what it means in the larger context of patient's on-going co-morbidities. [name] understands that COPD, CKD, and CHF are progressive, non-curable diseases underlying the patient's current acute medical conditions. Reviewed that currently, medical team has not found a treatable cause for patient's condition. Natural disease trajectory and expectations at EOL were discussed. I attempted to elicit values and goals of care important to the patient. The difference between aggressive medical intervention and comfort care was considered in light of the patient's goals of care.   Provided education and counseling at length on the philosophy and benefits of hospice care. Discussed that it offers a holistic approach to care in the setting of end-stage illness, and is about supporting the patient where they are allowing nature to take it's course. Discussed the hospice team includes RNs, physicians, social workers, and chaplains. They can provide personal care, support for the family, and help keep patient out of the hospital as well as assist with DME  needs for home hospice. Education provided on the difference  between home vs residential hospice. [name] would prefer patient return home with hospice - she feels comfortable continuing total care for the patient at EOL. She reflects on doing this for her father as well.  We talked about transition to comfort measures in house and what that would entail inclusive of medications to control pain, dyspnea, agitation, nausea, and itching. We discussed stopping all unnecessary measures such as blood draws, needle sticks, oxygen, antibiotics, CBGs/insulin, cardiac monitoring, IVF, and frequent vital signs.  At this time, goal is to rule out any treatable causes for patient's condition. If tests return unremarkable, [name] would like patient discharged home with hospice. She is open for PMT follow up tomorrow - meeting scheduled for 12p. In the event of a decline, [name] would not want escalation of patient's care, but would be open to her transition to full comfort measures. DNR/DNI confirmed.  Discussed with patient/family the importance of continued conversation with each other and the medical providers regarding overall plan of care and treatment options, ensuring decisions are within the context of the patient's values and GOCs.    Questions and concerns were addressed. The patient/family was encouraged to call with questions and/or concerns. PMT card was provided.  Primary Decision Maker: NEXT OF KIN - [name]    SUMMARY OF RECOMMENDATIONS   Continue current supportive interventions without escalation of care DNR/DNI confirmed Daughter's goal is to rule out any treatable causes for patient's current condition. If tests return unremarkable, daughter is open to patient's discharge home with hospice Follow up meeting with daughter scheduled for tomorrow 9/29 at 12p PMT will continue to follow and support holistically   Code Status/Advance Care Planning: DNR  Palliative Prophylaxis:  Aspiration, Bowel Regimen, Delirium Protocol, Frequent Pain  Assessment, Oral Care, and Turn Reposition  Additional Recommendations (Limitations, Scope, Preferences): Full Scope Treatment, No Artificial Feeding, No Surgical Procedures, and No Tracheostomy  Psycho-social/Spiritual:  Desire for further Chaplaincy support:no Created space and opportunity for patient and family to express thoughts and feelings regarding patient's current medical situation.  Emotional support and therapeutic listening provided.  Prognosis:  Unable to determine  Discharge Planning: To Be Determined      Primary Diagnoses: Present on Admission:  Essential hypertension  Hypothyroidism  COPD (chronic obstructive pulmonary disease) ([place])  Acute encephalopathy   I have reviewed the medical record, interviewed the patient and family, and examined the patient. The following aspects are pertinent.  Past Medical History:  Diagnosis Date   Allergy    Diabetes mellitus without complication ([place])    Hypertension    Social History   Socioeconomic History   Marital status: Married    Spouse name: Not on file   Number of children: 2   Years of education: Not on file   Highest education level: High school graduate  Occupational History   Occupation: retired  Tobacco Use   Smoking status: Former    Packs/day: 0.50    Years: 50.00    Total pack years: 25.00    Types: Cigarettes    Quit date: 2017    Years since quitting: 6.7   Smokeless tobacco: Never  Vaping Use   Vaping Use: Never used  Substance and Sexual Activity   Alcohol use: Not Currently   Drug use: Not Currently   Sexual activity: Not Currently  Other Topics Concern   Not on file  Social History Narrative   Not on file   Social  Determinants of Health   Financial Resource Strain: Low Risk  (05/12/2022)   Overall Financial Resource Strain (CARDIA)    Difficulty of Paying Living Expenses: Not hard at all  Food Insecurity: No Food Insecurity (05/12/2022)   Hunger Vital Sign    Worried About  Running Out of Food in the Last Year: Never true    Ran Out of Food in the Last Year: Never true  Transportation Needs: No Transportation Needs (05/12/2022)   PRAPARE - Hydrologist (Medical): No    Lack of Transportation (Non-Medical): No  Physical Activity: Inactive (05/12/2022)   Exercise Vital Sign    Days of Exercise per Week: 0 days    Minutes of Exercise per Session: 0 min  Stress: No Stress Concern Present (05/12/2022)   Heard    Feeling of Stress : Not at all  Social Connections: Not on file   Family History  Problem Relation Age of Onset   Heart disease Other    Stroke Other    Hypertension Other    COPD Other    Cancer Other    Asthma Other    Breast cancer Neg Hx    Scheduled Meds:  [START ON 06/25/2022] amLODipine  10 mg Oral Daily   enoxaparin (LOVENOX) injection  30 mg Subcutaneous Q24H   fluticasone furoate-vilanterol  1 puff Inhalation Daily   [START ON 06/25/2022] levothyroxine  112 mcg Oral Q0600   [START ON 06/25/2022] memantine  10 mg Oral Daily   mirtazapine  15 mg Oral QHS   rosuvastatin  20 mg Oral Daily   Continuous Infusions:  sodium chloride     PRN Meds:.acetaminophen **OR** acetaminophen, albuterol Medications Prior to Admission:  Prior to Admission medications   Medication Sig Start Date End Date Taking? Authorizing Provider  acetaminophen (TYLENOL) 325 MG tablet Take 2 tablets (650 mg total) by mouth every 6 (six) hours as needed for headache or mild pain. 07/24/21  Yes Mercy Riding, MD  albuterol (PROVENTIL HFA) 108 (90 Base) MCG/ACT inhaler Inhale 2 puffs into the lungs every 6 (six) hours as needed for wheezing or shortness of breath. Patient taking differently: Inhale 2 puffs into the lungs daily. 01/12/22  Yes Icard, Bradley L, DO  amLODipine (NORVASC) 10 MG tablet Take 1 tablet (10 mg total) by mouth daily. 12/02/21 12/02/22 Yes Glendale Chard, MD   Cyanocobalamin (VITAMIN B-12 PO) Take 1 tablet by mouth daily.   Yes [provider]  fluticasone-salmeterol (WIXELA INHUB) 100-50 MCG/ACT AEPB Inhale 1 puff into the lungs 2 (two) times daily. 12/04/21  Yes Icard, Bradley L, DO  isosorbide-hydrALAZINE (BIDIL) 20-37.5 MG tablet Take 1 tablet by mouth 3 (three) times daily. 06/13/21  Yes Debbe Odea, MD  levothyroxine (SYNTHROID) 112 MCG tablet TAKE 1 TABLET BY MOUTH DAILY Patient taking differently: Take 112 mcg by mouth daily. 03/04/22  Yes Leslie Brine, FNP  losartan (COZAAR) 25 MG tablet Take 25 mg by mouth daily. 01/12/22  Yes [provider]  memantine (NAMENDA) 10 MG tablet Take 1 tablet (10 mg total) by mouth daily. 12/03/21  Yes [name], MD  metoprolol succinate (TOPROL-XL) 25 MG 24 hr tablet Take 1 tablet (25 mg total) by mouth daily. 05/24/22  Yes [name], MD  mirtazapine (REMERON) 15 MG tablet Take 1 tablet (15 mg total) by mouth at bedtime. 05/19/22  Yes Glendale Chard, MD  rosuvastatin (CRESTOR) 20 MG tablet TAKE 1  TABLET BY MOUTH DAILY Patient taking differently: Take 20 mg by mouth daily. 02/15/22  Yes Glendale Chard, MD  Accu-Chek Softclix Lancets lancets Use as instructed 03/04/21   Leslie Brine, FNP  blood glucose meter kit and supplies KIT Dispense based on patient and insurance preference. Use up to four times daily as directed. (FOR ICD-9 250.00, 250.01). 11/23/18   Leslie Brine, FNP  glucose blood (ACCU-CHEK AVIVA PLUS) test strip Use as instructed 11/27/18   Leslie Brine, FNP   Allergies  Allergen Reactions   Penicillin G Rash   Quinapril Other (See Comments)    Unknown   Penicillins Rash   Review of Systems  Unable to perform ROS: Mental status change    Physical Exam Vitals and nursing note reviewed.  Constitutional:      General: She is not in acute distress.    Appearance: She is ill-appearing.  Pulmonary:     Effort: No respiratory distress.  Skin:    General: Skin is warm and  dry.  Neurological:     Mental Status: She is lethargic.     Motor: Weakness present.  Psychiatric:        Speech: She is noncommunicative.     Vital Signs: BP (!) 144/55   Pulse (!) 51   Temp 97.6 F (36.4 C) (Oral)   Resp 13   Ht 5' 2" (1.575 m)   Wt 54.4 kg   SpO2 100%   BMI 21.95 kg/m  Pain Scale: 0-10   Pain Score: 0-No pain   SpO2: SpO2: 100 % O2 Device:SpO2: 100 % O2 Flow Rate: .O2 Flow Rate (L/min): 6 L/min  IO: Intake/output summary: No intake or output data in the 24 hours ending 06/24/22 1719  LBM:   Baseline Weight: Weight: 54.4 kg Most recent weight: Weight: 54.4 kg     Palliative Assessment/Data: PPS 10%     Time In: 1600 Time Out: 1730 Time Total: 90 minutes  Greater than 50%  of this time was spent counseling and coordinating care related to the above assessment and plan.  Signed by: Lin Landsman, NP   Please contact Palliative Medicine Team phone at [phone] for questions and concerns.  For individual provider: See Amion  *Portions of this note are a verbal dictation therefore any spelling and/or grammatical errors are due to the "[employer]" system interpretation.

## 2022-06-25 ENCOUNTER — Observation Stay (HOSPITAL_BASED_OUTPATIENT_CLINIC_OR_DEPARTMENT_OTHER): Payer: Medicare Other

## 2022-06-25 ENCOUNTER — Other Ambulatory Visit: Payer: Medicare Other | Admitting: Primary Care

## 2022-06-25 DIAGNOSIS — R609 Edema, unspecified: Secondary | ICD-10-CM | POA: Diagnosis not present

## 2022-06-25 DIAGNOSIS — S065XAA Traumatic subdural hemorrhage with loss of consciousness status unknown, initial encounter: Secondary | ICD-10-CM

## 2022-06-25 DIAGNOSIS — G9340 Encephalopathy, unspecified: Secondary | ICD-10-CM | POA: Diagnosis not present

## 2022-06-25 LAB — URINE CULTURE: Culture: NO GROWTH

## 2022-06-25 LAB — BASIC METABOLIC PANEL
Anion gap: 5 (ref 5–15)
BUN: 56 mg/dL — ABNORMAL HIGH (ref 8–23)
CO2: 23 mmol/L (ref 22–32)
Calcium: 7 mg/dL — ABNORMAL LOW (ref 8.9–10.3)
Chloride: 118 mmol/L — ABNORMAL HIGH (ref 98–111)
Creatinine, Ser: 2.41 mg/dL — ABNORMAL HIGH (ref 0.44–1.00)
GFR, Estimated: 20 mL/min — ABNORMAL LOW (ref >60)
Glucose, Bld: 64 mg/dL — ABNORMAL LOW (ref 70–99)
Potassium: 4 mmol/L (ref 3.5–5.1)
Sodium: 146 mmol/L — ABNORMAL HIGH (ref 135–145)

## 2022-06-25 LAB — CBC
HCT: 39.2 % (ref 36.0–46.0)
Hemoglobin: 11.4 g/dL — ABNORMAL LOW (ref 12.0–15.0)
MCH: 29.7 pg (ref 26.0–34.0)
MCHC: 29.1 g/dL — ABNORMAL LOW (ref 30.0–36.0)
MCV: 102.1 fL — ABNORMAL HIGH (ref 80.0–100.0)
Platelets: 188 10*3/uL (ref 150–400)
RBC: 3.84 MIL/uL — ABNORMAL LOW (ref 3.87–5.11)
RDW: 17.2 % — ABNORMAL HIGH (ref 11.5–15.5)
WBC: 4.5 10*3/uL (ref 4.0–10.5)
nRBC: 1.3 % — ABNORMAL HIGH (ref 0.0–0.2)

## 2022-06-25 MED ORDER — ISOSORB DINITRATE-HYDRALAZINE 20-37.5 MG PO TABS
1.0000 | ORAL_TABLET | Freq: Three times a day (TID) | ORAL | Status: DC
  Administered 2022-06-25: 1 via ORAL
  Filled 2022-06-25: qty 1

## 2022-06-25 NOTE — ED Notes (Signed)
Pt whelled to bathroom. Urinated and had a small BM

## 2022-06-25 NOTE — Progress Notes (Signed)
Upper and lower extremity venous duplex has been completed.   Preliminary results in CV Proc.   Jinny Blossom Courtney Bellizzi 06/25/2022 8:36 AM

## 2022-06-25 NOTE — Discharge Summary (Signed)
Physician Discharge Summary  Leslie Reeves GBT:[id number] DOB: Dec 27, 1940 DOA: 06/24/2022  PCP: [name], FNP  Admit date: 06/24/2022 Discharge date: 06/25/2022    Admitted From: Home Disposition: Home  Recommendations for Outpatient Follow-up:  Follow up with PCP in 1-2 weeks Please obtain BMP/CBC in one week Please follow up with your PCP on the following pending results: Unresulted Labs (From admission, onward)     Start     Ordered   06/24/22 2100  Vitamin B12  Once,   R        06/24/22 2100   06/24/22 1427  Sodium, urine, random  Once,   R        06/24/22 1426   06/24/22 1427  Creatinine, urine, random  Once,   R        06/24/22 1426   06/24/22 1427  Urine Culture  (Urine Culture)  Once,   R       Question:  Indication  Answer:  Altered mental status (if no other cause identified)   06/24/22 1426              Home Health: None Equipment/Devices: None  Discharge Condition: Stable CODE STATUS: DNR Diet recommendation: Cardiac  Subjective: Patient seen and examined.  She is alert and oriented x2.  Daughter and friend at the bedside confirms that this is her baseline.  She is no more confused.  Daughter is agreeable with patient discharging back home today.  She has already met with palliative care.  Plan is to eventually sign up for hospice in near future.    HPI: [name] is a 81 y.o. female with medical history significant of HTN, hypothyroidism, T2DM, CKD stage 3, COPD with chronic respiratory failure on 3L oxygen who presents today for AMS that started this AM. Her daughter gives history.  She states she was getting her ready this morning and seemed different and out of it this morning. She was really drowsy and couldn't assist with getting ready like she normally does.  Sh was falling asleep and drooling while talking to her daughter. She denies any focal deficits. She has global weakness, but daughter states her right side has become weaker, but this is  not an acute finding.    No recent illness or fevers. No diarrhea, coughing or urinary complaints. No sick contacts      She does not smoke (past history), no alcohol.    ER Course:  vitals: afebrile, bp: 126/46, HR: 62, RR: 22, oxygen: 86%RA>88%3L>92%6L>3L  Pertinent labs: BUN: 56, creatinine: 2.85, troponin 73> D-dimer: 8.97, calcium: 8.5  CXR: mild bibasilar subsegmental atelectasis or edema is noted with small pleural effusions  CT head: no acute finding. Chronic infarct in left basal ganglia.  In ED: given calcium. TRH asked to admit.   Brief/Interim Summary:   Acute encephalopathy 81 year old presenting with acute onset of AMS from baseline of memory loss with increased somnolence.  CT head unremarkable.  MRI brain showed 2 mm subdural subacute hemorrhage.  Patient is back at baseline, confirmed by daughter.   Elevated troponin: Slightly elevated and flat, demand ischemia.  No ACS. I Acute renal failure superimposed on stage 3b chronic kidney disease (HCC) Baseline creatinine appears to be around 2.40, up to 2.85 upon presentation and now back to baseline after she received some IV fluids.  Edema of right upper arm DVT ruled out.   Acute on chronic respiratory failure with hypoxia (HCC) Questionable acute on chronic respiratory failure  Per EDP was put on 6L, but has been back to her baseline of 3L ever since he has seen her been doing fine She has OSA and does drop her sats while sleeping  He did order a d-dimer which was elevated at 8.97 disucssed with family and we will start with LE DVT dopplers with as negative.  Patient is now down to 3 L and saturating 96%, this is her baseline.   Elevated d-dimer D-dimer >8; however, she has no tachycardia or increased oxygen demands.  Lower extremity Doppler negative for DVT.  Doubt PE.   FTT (failure to thrive) in adult Palliative consult    Essential hypertension Blood pressure initially soft to normal Continue home  norvasc Held toprol with bradycardia/increased somnolence  Holding cozaar with AKI on CKD Held bidil with soft readings.  Blood pressure slightly elevated now but is still bradycardiac.  Discontinuing losartan and Toprol at discharge.   DM2 (diabetes mellitus, type 2) (HCC) A1C of 4.3 in 11/2021 No SSI at this point    Hypothyroidism Continue home synthroid at 133mg daily    COPD (chronic obstructive pulmonary disease) (HCC) No evidence of exacerbation Continue advair and SABA prn     Discharge plan was discussed with patient and/or family member and they verbalized understanding and agreed with it.  Discharge Diagnoses:  Principal Problem:   Acute encephalopathy Active Problems:   Elevated troponin   Acute renal failure superimposed on stage 3b chronic kidney disease (HCC)   Acute on chronic respiratory failure with hypoxia (HCC)   Edema of right upper arm   Elevated d-dimer   FTT (failure to thrive) in adult   Essential hypertension   DM2 (diabetes mellitus, type 2) (HCC)   Hypothyroidism   COPD (chronic obstructive pulmonary disease) ([place]   [place] hematoma [place]    Discharge Instructions   Allergies as of 06/25/2022       Reactions   Penicillin G Rash   Quinapril Other (See Comments)   Unknown   Penicillins Rash        Medication List     STOP taking these medications    losartan 25 MG tablet Commonly known as: COZAAR   metoprolol succinate 25 MG 24 hr tablet Commonly known as: TOPROL-XL       TAKE these medications    Accu-Chek Softclix Lancets lancets Use as instructed   acetaminophen 325 MG tablet Commonly known as: TYLENOL Take 2 tablets (650 mg total) by mouth every 6 (six) hours as needed for headache or mild pain.   albuterol 108 (90 Base) MCG/ACT inhaler Commonly known as: Proventil HFA Inhale 2 puffs into the lungs every 6 (six) hours as needed for wheezing or shortness of breath. What changed: when to take this    amLODipine 10 MG tablet Commonly known as: NORVASC Take 1 tablet (10 mg total) by mouth daily.   blood glucose meter kit and supplies Kit Dispense based on patient and insurance preference. Use up to four times daily as directed. (FOR ICD-9 250.00, 250.01).   fluticasone-salmeterol 100-50 MCG/ACT Aepb Commonly known as: Wixela Inhub Inhale 1 puff into the lungs 2 (two) times daily.   glucose blood test strip Commonly known as: Accu-Chek Aviva Plus Use as instructed   isosorbide-hydrALAZINE 20-37.5 MG tablet Commonly known as: BIDIL Take 1 tablet by mouth 3 (three) times daily.   levothyroxine 112 MCG tablet Commonly known as: SYNTHROID TAKE 1 TABLET BY MOUTH DAILY   memantine 10 MG tablet Commonly known  as: NAMENDA Take 1 tablet (10 mg total) by mouth daily.   mirtazapine 15 MG tablet Commonly known as: REMERON Take 1 tablet (15 mg total) by mouth at bedtime.   rosuvastatin 20 MG tablet Commonly known as: CRESTOR TAKE 1 TABLET BY MOUTH DAILY   VITAMIN B-12 PO Take 1 tablet by mouth daily.        Follow-up Information     [name], FNP Follow up in 1 week(s).   Specialty: General Practice Contact information: [address] [place] [address] [phone]         [name], MD .   Specialty: Cardiology Contact information: [address] [place] [address] [phone]                Allergies  Allergen Reactions   Penicillin G Rash   Quinapril Other (See Comments)    Unknown   Penicillins Rash    Consultations: Palliative care   Procedures/Studies: VAS [place] LOWER EXTREMITY VENOUS (DVT)  Result Date: 06/25/2022  Lower Venous DVT Study Patient Name:  SHRONDA BOEH  Date of Exam:   06/25/2022 Medical Rec #: [id number]       Accession #:    [id number] Date of Birth: [birth year]        Patient Gender: F Patient Age:   81 years Exam Location:  [facility] Procedure:      VAS [place] LOWER EXTREMITY  VENOUS (DVT) Referring Phys: Orma Flaming --------------------------------------------------------------------------------  Indications: Edema.  Comparison Study: no prior Performing Technologist: Archie Patten RVS  Examination Guidelines: A complete evaluation includes B-mode imaging, spectral Doppler, color Doppler, and power Doppler as needed of all accessible portions of each vessel. Bilateral testing is considered an integral part of a complete examination. Limited examinations for reoccurring indications may be performed as noted. The reflux portion of the exam is performed with the patient in reverse Trendelenburg.  +---------+---------------+---------+-----------+----------+--------------+ RIGHT    CompressibilityPhasicitySpontaneityPropertiesThrombus Aging +---------+---------------+---------+-----------+----------+--------------+ CFV      Full           Yes      Yes                                 +---------+---------------+---------+-----------+----------+--------------+ SFJ      Full                                                        +---------+---------------+---------+-----------+----------+--------------+ FV Prox  Full                                                        +---------+---------------+---------+-----------+----------+--------------+ FV Mid   Full                                                        +---------+---------------+---------+-----------+----------+--------------+ FV DistalFull                                                        +---------+---------------+---------+-----------+----------+--------------+  PFV      Full                                                        +---------+---------------+---------+-----------+----------+--------------+ POP      Full           Yes      Yes                                 +---------+---------------+---------+-----------+----------+--------------+ PTV      Full                                                         +---------+---------------+---------+-----------+----------+--------------+ PERO     Full                                                        +---------+---------------+---------+-----------+----------+--------------+   +---------+---------------+---------+-----------+----------+--------------+ LEFT     CompressibilityPhasicitySpontaneityPropertiesThrombus Aging +---------+---------------+---------+-----------+----------+--------------+ CFV      Full           Yes      Yes                                 +---------+---------------+---------+-----------+----------+--------------+ SFJ      Full                                                        +---------+---------------+---------+-----------+----------+--------------+ FV Prox  Full                                                        +---------+---------------+---------+-----------+----------+--------------+ FV Mid   Full                                                        +---------+---------------+---------+-----------+----------+--------------+ FV DistalFull                                                        +---------+---------------+---------+-----------+----------+--------------+ PFV      Full                                                        +---------+---------------+---------+-----------+----------+--------------+  POP      Full           Yes      Yes                                 +---------+---------------+---------+-----------+----------+--------------+ PTV      Full                                                        +---------+---------------+---------+-----------+----------+--------------+ PERO     Full                                                        +---------+---------------+---------+-----------+----------+--------------+     Summary: BILATERAL: - No evidence of deep vein thrombosis seen in  the lower extremities, bilaterally. -No evidence of popliteal cyst, bilaterally.   *See table(s) above for measurements and observations.    Preliminary    VAS [place] UPPER EXTREMITY VENOUS DUPLEX  Result Date: 06/25/2022 UPPER VENOUS STUDY  Patient Name:  ELECTA STERRY  Date of Exam:   06/25/2022 Medical Rec #: [id number]       Accession #:    [id number] Date of Birth: 1940-12-22        Patient Gender: F Patient Age:   58 years Exam Location:  [facility] Procedure:      VAS [place] UPPER EXTREMITY VENOUS DUPLEX Referring Phys: Orma Flaming --------------------------------------------------------------------------------  Indications: Edema Comparison Study: no prior Performing Technologist: Archie Patten RVS  Examination Guidelines: A complete evaluation includes B-mode imaging, spectral Doppler, color Doppler, and power Doppler as needed of all accessible portions of each vessel. Bilateral testing is considered an integral part of a complete examination. Limited examinations for reoccurring indications may be performed as noted.  Right Findings: +----------+------------+---------+-----------+----------+--------------+ RIGHT     CompressiblePhasicitySpontaneousProperties   Summary     +----------+------------+---------+-----------+----------+--------------+ IJV           Full       Yes       Yes                             +----------+------------+---------+-----------+----------+--------------+ Subclavian    Full       Yes       Yes                             +----------+------------+---------+-----------+----------+--------------+ Axillary      Full       Yes       Yes                             +----------+------------+---------+-----------+----------+--------------+ Brachial      Full       Yes       Yes                             +----------+------------+---------+-----------+----------+--------------+ Radial        Full                                                  +----------+------------+---------+-----------+----------+--------------+  Ulnar         Full                                                 +----------+------------+---------+-----------+----------+--------------+ Cephalic      Full                                                 +----------+------------+---------+-----------+----------+--------------+ Basilic                                             Not visualized +----------+------------+---------+-----------+----------+--------------+  Left Findings: +----------+------------+---------+-----------+----------+-------+ LEFT      CompressiblePhasicitySpontaneousPropertiesSummary +----------+------------+---------+-----------+----------+-------+ Subclavian    Full       Yes       Yes                      +----------+------------+---------+-----------+----------+-------+  Summary:  Right: No evidence of deep vein thrombosis in the upper extremity. No evidence of superficial vein thrombosis in the upper extremity.  Left: No evidence of thrombosis in the subclavian.  *See table(s) above for measurements and observations.     Preliminary    MR BRAIN WO CONTRAST  Result Date: 06/24/2022 CLINICAL DATA:  Altered mental status, stroke suspected EXAM: MRI HEAD WITHOUT CONTRAST TECHNIQUE: Multiplanar, multiecho pulse sequences of the brain and surrounding structures were obtained without intravenous contrast. COMPARISON:  No prior MRI, correlation is made with CT head 06/24/2022 FINDINGS: Brain: Trace subdural collection along the right parietal and occipital lobe, which measures up to 2 mm (series 6, image 17). No significant mass effect related to the collection. The collection is T1 and T2 hyperintense, most likely late subacute hemorrhage. No restricted diffusion to suggest acute or subacute infarct. No acute parenchymal hemorrhage, mass, mass effect, or midline shift. No hydrocephalus. Remote infarct in the left lentiform  nucleus. T2 hyperintense signal in the periventricular white matter and pons, likely the sequela of moderate chronic small vessel ischemic disease. Cerebral atrophy is likely within normal limits for age. Vascular: Normal arterial flow voids. Skull and upper cervical spine: Normal marrow signal. Sinuses/Orbits: Mucous retention cyst in the left sphenoid sinus. Status post bilateral lens replacements. Other: Fluid throughout the bilateral mastoid air cells. IMPRESSION: 1. Trace subdural collection along the right parietal and occipital lobe, which measures up to 2 mm, most likely late subacute hemorrhage. No significant mass effect related to the collection. 2. No evidence of acute or subacute infarct. Electronically Signed   By: Merilyn Baba M.D.   On: 06/24/2022 19:34   CT Head Wo Contrast  Result Date: 06/24/2022 CLINICAL DATA:  Mental status change, unknown cause EXAM: CT HEAD WITHOUT CONTRAST TECHNIQUE: Contiguous axial images were obtained from the base of the skull through the vertex without intravenous contrast. RADIATION DOSE REDUCTION: This exam was performed according to the departmental dose-optimization program which includes automated exposure control, adjustment of the mA and/or kV according to patient size and/or use of iterative reconstruction technique. COMPARISON:  Head CT 02/18/2021 FINDINGS: Brain: Nose of acute intracranial hemorrhage or abnormal extra-axial collection. Unchanged old left basal ganglia and periventricular  white matter infarct.Patent basal cisterns.The ventricles are unchanged in size.Unchanged chronic small vessel ischemic disease. Vascular: Vascular calcifications. Skull: Negative for skull fracture. Sinuses/Orbits: There are bilateral mastoid effusions, new from prior, with small amount of middle ear fluid. No evidence of bony destruction. The paranasal sinuses are predominantly clear, with minimal mucosal thickening in the sphenoid, ethmoid air cell, and frontal sinuses.  The orbits are unremarkable. Other: Unchanged calcified scalp lesions. IMPRESSION: No acute intracranial abnormality. Unchanged chronic infarct in the left basal ganglia and periventricular white matter. Electronically Signed   By: Maurine Simmering M.D.   On: 06/24/2022 12:27   DG Chest Portable 1 View  Result Date: 06/24/2022 CLINICAL DATA:  Hypoxia. EXAM: PORTABLE CHEST 1 VIEW COMPARISON:  May 26, 2022. FINDINGS: Stable cardiomediastinal silhouette. Mild bibasilar subsegmental atelectasis or edema is noted with small pleural effusions. Bony thorax is unremarkable. IMPRESSION: Mild bibasilar subsegmental atelectasis or edema is noted with small pleural effusions. Aortic Atherosclerosis (ICD10-I70.0). Electronically Signed   By: Marijo Conception M.D.   On: 06/24/2022 11:57   DG Chest 2 View  Result Date: 05/26/2022 CLINICAL DATA:  Pleural effusion. EXAM: CHEST - 2 VIEW COMPARISON:  Feb 15, 2022. FINDINGS: Stable cardiomediastinal silhouette. Stable bilateral pleural effusions are noted, left greater than right, with associated bibasilar atelectasis. Bony thorax is unremarkable. IMPRESSION: Stable bilateral pleural effusions, left greater than right, with associated bibasilar atelectasis. Electronically Signed   By: Marijo Conception M.D.   On: 05/26/2022 17:49     Discharge Exam: Vitals:   06/25/22 0800 06/25/22 0821  BP: (!) 154/50   Pulse: (!) 53   Resp: 12   Temp:  97.9 F (36.6 C)  SpO2: 95%    Vitals:   06/24/22 2100 06/25/22 0413 06/25/22 0800 06/25/22 0821  BP: (!) 149/63 (!) 155/55 (!) 154/50   Pulse: (!) 52 (!) 58 (!) 53   Resp: _0 Temp:  (!) 97.4 F (36.3 C)  97.9 F (36.6 C)  TempSrc:  Oral  Oral  SpO2: 100% 100% 95%   Weight:      Height:        General: Pt is alert, awake, not in acute distress Cardiovascular: RRR, S1/S2 +, no rubs, no gallops Respiratory: CTA bilaterally, no wheezing, no rhonchi Abdominal: Soft, NT, ND, bowel sounds + Extremities: no edema, no  cyanosis    The results of significant diagnostics from this hospitalization (including imaging, microbiology, ancillary and laboratory) are listed below for reference.     Microbiology: Recent Results (from the past 240 hour(s))  Resp Panel by RT-PCR (Flu A&B, Covid) Anterior Nasal Swab     Status: None   Collection Time: 06/24/22 11:52 AM   Specimen: Anterior Nasal Swab  Result Value Ref Range Status   SARS Coronavirus 2 by RT PCR NEGATIVE NEGATIVE Final    Comment: (NOTE) SARS-CoV-2 target nucleic acids are NOT DETECTED.  The SARS-CoV-2 RNA is generally detectable in upper respiratory specimens during the acute phase of infection. The lowest concentration of SARS-CoV-2 viral copies this assay can detect is 138 copies/mL. A negative result does not preclude SARS-Cov-2 infection and should not be used as the sole basis for treatment or other patient management decisions. A negative result may occur with  improper specimen collection/handling, submission of specimen other than nasopharyngeal swab, presence of viral mutation(s) within the areas targeted by this assay, and inadequate number of viral copies(<138 copies/mL). A negative result must be combined with clinical observations, patient  history, and epidemiological information. The expected result is Negative.  Fact Sheet for Patients:  EntrepreneurPulse.com.au  Fact Sheet for Healthcare Providers:  IncredibleEmployment.be  This test is no t yet approved or cleared by the [place] FDA and  has been authorized for detection and/or diagnosis of SARS-CoV-2 by FDA under an Emergency Use Authorization (EUA). This EUA will remain  in effect (meaning this test can be used) for the duration of the COVID-19 declaration under Section 564(b)(1) of the Act, 21 U.S.C.section 360bbb-3(b)(1), unless the authorization is terminated  or revoked sooner.       Influenza A by PCR NEGATIVE NEGATIVE  Final   Influenza B by PCR NEGATIVE NEGATIVE Final    Comment: (NOTE) The Xpert Xpress SARS-CoV-2/FLU/RSV plus assay is intended as an aid in the diagnosis of influenza from Nasopharyngeal swab specimens and should not be used as a sole basis for treatment. Nasal washings and aspirates are unacceptable for Xpert Xpress SARS-CoV-2/FLU/RSV testing.  Fact Sheet for Patients: EntrepreneurPulse.com.au  Fact Sheet for Healthcare Providers: IncredibleEmployment.be  This test is not yet approved or cleared by the [place] FDA and has been authorized for detection and/or diagnosis of SARS-CoV-2 by FDA under an Emergency Use Authorization (EUA). This EUA will remain in effect (meaning this test can be used) for the duration of the COVID-19 declaration under Section 564(b)(1) of the Act, 21 U.S.C. section 360bbb-3(b)(1), unless the authorization is terminated or revoked.  Performed at [facility], [place] [address], [place], [place] [address]      Labs: BNP (last 3 results) Recent Labs    07/13/21 1040 07/23/21 0133 06/24/22 1204  BNP 50.9 129.8* 448.1*   Basic Metabolic Panel: Recent Labs  Lab 06/24/22 1154 06/24/22 1155 06/24/22 1204 06/24/22 2100 06/25/22 0307  NA 141 143 144  --  146*  K 4.4 4.4 4.6  --  4.0  CL  --  113* 112*  --  118*  CO2  --   --  23  --  23  GLUCOSE  --  91 94  --  64*  BUN  --  64* 56*  --  56*  CREATININE  --  3.10* 2.85*  --  2.41*  CALCIUM  --   --  8.5*  --  7.0*  MG  --   --   --  2.2  --    Liver Function Tests: No results for input(s): "AST", "ALT", "ALKPHOS", "BILITOT", "PROT", "ALBUMIN" in the last 168 hours. No results for input(s): "LIPASE", "AMYLASE" in the last 168 hours. Recent Labs  Lab 06/24/22 2100  AMMONIA 27   CBC: Recent Labs  Lab 06/24/22 1154 06/24/22 1155 06/24/22 1204 06/25/22 0307  WBC  --   --  5.0 4.5  NEUTROABS  --   --  4.2  --   HGB 15.0 14.3 13.6 11.4*   HCT 44.0 42.0 46.3* 39.2  MCV  --   --  101.1* 102.1*  PLT  --   --  251 188   Cardiac Enzymes: No results for input(s): "CKTOTAL", "CKMB", "CKMBINDEX", "TROPONINI" in the last 168 hours. BNP: Invalid input(s): "POCBNP" CBG: Recent Labs  Lab 06/24/22 1142  GLUCAP 88   D-Dimer Recent Labs    06/24/22 1204  DDIMER 8.97*   Hgb A1c No results for input(s): "HGBA1C" in the last 72 hours. Lipid Profile No results for input(s): "CHOL", "HDL", "LDLCALC", "TRIG", "CHOLHDL", "LDLDIRECT" in the last 72 hours. Thyroid function studies Recent Labs  06/24/22 1132  TSH 0.702   Anemia work up No results for input(s): "VITAMINB12", "FOLATE", "FERRITIN", "TIBC", "IRON", "RETICCTPCT" in the last 72 hours. Urinalysis    Component Value Date/Time   COLORURINE YELLOW 06/24/2022 1318   APPEARANCEUR CLEAR 06/24/2022 1318   LABSPEC >1.030 (H) 06/24/2022 1318   PHURINE 6.0 06/24/2022 1318   GLUCOSEU NEGATIVE 06/24/2022 1318   HGBUR NEGATIVE 06/24/2022 1318   BILIRUBINUR NEGATIVE 06/24/2022 1318   BILIRUBINUR Negative 07/13/2021 1654   KETONESUR NEGATIVE 06/24/2022 1318   PROTEINUR >300 (A) 06/24/2022 1318   UROBILINOGEN 0.2 07/13/2021 1654   NITRITE NEGATIVE 06/24/2022 1318   LEUKOCYTESUR NEGATIVE 06/24/2022 1318   Sepsis Labs Recent Labs  Lab 06/24/22 1204 06/25/22 0307  WBC 5.0 4.5   Microbiology Recent Results (from the past 240 hour(s))  Resp Panel by RT-PCR (Flu A&B, Covid) Anterior Nasal Swab     Status: None   Collection Time: 06/24/22 11:52 AM   Specimen: Anterior Nasal Swab  Result Value Ref Range Status   SARS Coronavirus 2 by RT PCR NEGATIVE NEGATIVE Final    Comment: (NOTE) SARS-CoV-2 target nucleic acids are NOT DETECTED.  The SARS-CoV-2 RNA is generally detectable in upper respiratory specimens during the acute phase of infection. The lowest concentration of SARS-CoV-2 viral copies this assay can detect is 138 copies/mL. A negative result does not preclude  SARS-Cov-2 infection and should not be used as the sole basis for treatment or other patient management decisions. A negative result may occur with  improper specimen collection/handling, submission of specimen other than nasopharyngeal swab, presence of viral mutation(s) within the areas targeted by this assay, and inadequate number of viral copies(<138 copies/mL). A negative result must be combined with clinical observations, patient history, and epidemiological information. The expected result is Negative.  Fact Sheet for Patients:  EntrepreneurPulse.com.au  Fact Sheet for Healthcare Providers:  IncredibleEmployment.be  This test is no t yet approved or cleared by the [place] FDA and  has been authorized for detection and/or diagnosis of SARS-CoV-2 by FDA under an Emergency Use Authorization (EUA). This EUA will remain  in effect (meaning this test can be used) for the duration of the COVID-19 declaration under Section 564(b)(1) of the Act, 21 U.S.C.section 360bbb-3(b)(1), unless the authorization is terminated  or revoked sooner.       Influenza A by PCR NEGATIVE NEGATIVE Final   Influenza B by PCR NEGATIVE NEGATIVE Final    Comment: (NOTE) The Xpert Xpress SARS-CoV-2/FLU/RSV plus assay is intended as an aid in the diagnosis of influenza from Nasopharyngeal swab specimens and should not be used as a sole basis for treatment. Nasal washings and aspirates are unacceptable for Xpert Xpress SARS-CoV-2/FLU/RSV testing.  Fact Sheet for Patients: EntrepreneurPulse.com.au  Fact Sheet for Healthcare Providers: IncredibleEmployment.be  This test is not yet approved or cleared by the [place] FDA and has been authorized for detection and/or diagnosis of SARS-CoV-2 by FDA under an Emergency Use Authorization (EUA). This EUA will remain in effect (meaning this test can be used) for the duration of  the COVID-19 declaration under Section 564(b)(1) of the Act, 21 U.S.C. section 360bbb-3(b)(1), unless the authorization is terminated or revoked.  Performed at [facility], [place] [address] [place], [place] [address]      Time coordinating discharge: Over 30 minutes  SIGNED:   Darliss Cheney, MD  [facility] 06/25/2022, 10:00 AM *Please note that this is a verbal dictation therefore any spelling or grammatical errors are due to  the "[place] One" system interpretation. If 7PM-7AM, please contact night-coverage [email or web address]

## 2022-06-25 NOTE — Progress Notes (Signed)
Brief Palliative Medicine Progress Note:  Chart review performed - noted patient discharged prior to scheduled 12p meeting with daughter.  Patient is hospice appropriate if/when daughter agreeable. Patient is already enrolled with [facility] outpatient [facility]. Reached out to their liaison, Nicholaus Corolla, to see if one of their providers can visit with patient sooner than later to discuss enrolling patient into hospice care. Without hospice, patient remains high risk for rehospitalization as she is likely approaching EOL.  Judson Roch will discuss patient's case with [facility] providers.   Thank you for allowing [facility] to assist in the care of this patient.  Amyia Lodwick M. Tamala Julian [facility] Palliative Medicine Team Team Phone: [phone] NO CHARGE

## 2022-06-27 DIAGNOSIS — E43 Unspecified severe protein-calorie malnutrition: Secondary | ICD-10-CM | POA: Diagnosis not present

## 2022-06-28 ENCOUNTER — Telehealth: Payer: Self-pay | Admitting: *Deleted

## 2022-06-28 ENCOUNTER — Other Ambulatory Visit: Payer: Medicare Other

## 2022-06-28 ENCOUNTER — Telehealth: Payer: Self-pay

## 2022-06-28 ENCOUNTER — Other Ambulatory Visit: Payer: Self-pay | Admitting: Nurse Practitioner

## 2022-06-28 DIAGNOSIS — I5042 Chronic combined systolic (congestive) and diastolic (congestive) heart failure: Secondary | ICD-10-CM

## 2022-06-28 DIAGNOSIS — Z515 Encounter for palliative care: Secondary | ICD-10-CM

## 2022-06-28 NOTE — Telephone Encounter (Signed)
Transition Care Management Follow-up Telephone Call Date of discharge and from where: 06/25/2022 [place]  How have you been since you were released from the hospital? Daughter reports she is really weak, she cant stand up or sit up at all.  Any questions or concerns? No  Items Reviewed: Did the pt receive and understand the discharge instructions provided? Yes  Medications obtained and verified? Yes  Other? No  Any new allergies since your discharge? No  Dietary orders reviewed? Yes Do you have support at home? Yes   Home Care and Equipment/Supplies: Were home health services ordered? no If so, what is the name of the agency? N/a  Has the agency set up a time to come to the patient's home? no Were any new equipment or medical supplies ordered?  No What is the name of the medical supply agency? N/a Were you able to get the supplies/equipment? no Do you have any questions related to the use of the equipment or supplies? No  Functional Questionnaire: (I = Independent and D = Dependent) ADLs: i  Bathing/Dressing- i  Meal Prep- i  Eating- i  Maintaining continence- i  Transferring/Ambulation- i  Managing Meds- i  Follow up appointments reviewed:  PCP Hospital f/u appt confirmed? Yes  Scheduled to see janece moore on n/a @ n/a. [facility] f/u appt confirmed? No  Scheduled to see n/a on n/a @ n/a. Are transportation arrangements needed? No  If their condition worsens, is the pt aware to call PCP or go to the Emergency Dept.? Yes Was the patient provided with contact information for the PCP's office or ED? Yes Was to pt encouraged to call back with questions or concerns? Yes

## 2022-06-28 NOTE — Progress Notes (Signed)
COMMUNITY PALLIATIVE CARE SW NOTE  PATIENT NAME: Leslie Reeves DOB: 10/15/1940 MRN: [id number]  PRIMARY CARE PROVIDER: [name], FNP  RESPONSIBLE PARTY:  Acct ID - Guarantor Home Phone Work Phone Relationship Acct Type  [id number] ERZA, [id number] [phone]  Self P/F     [address], [name], [place] [address]   SOCIAL WORK TELEPHONIC VISIT  PC SW completed a telephonic visit with patient's daughter-Sherbia. She advised that she advised that patient had a recent emergency department visit due to increased weakness, decreased alertness, unable to ambulate and increased swelling to her face and arms. After several test were ran it was determined that patient's kidneys have worsened. Patient was discharged back home. Since she returned home. She is sleeping all th time, her legs and body are limp where she is unable to sit or stand without assistance. Her daughter advised that it is difficult to do personal care for her. She is requesting a hospice consult. SW provided education regarding how the hospice consult work, hospice services and its benefits and the focus on comfort care. [name] agreed to consult. SW updated the RN for follow-up and she will seek order from patient's PCP.      [address] [place], [place]

## 2022-06-28 NOTE — Telephone Encounter (Signed)
Received a call from Leslie Reeves who states that patient's daughter contacted her and is now requesting an order for a hospice consult. Patient continues to decline overall. I called and left a voicemail with [name] FNP to request an order for a hospice evaluation. Left my contact information for return call.

## 2022-06-28 NOTE — Telephone Encounter (Signed)
Received a return call from [place], nurse for [name] FNP who advised that Leslie Reeves is faxing an order for a hospice consult over to our referral center. Order has been received by our [facility] referral center. Palliative care has been discharged.

## 2022-06-29 ENCOUNTER — Telehealth (INDEPENDENT_AMBULATORY_CARE_PROVIDER_SITE_OTHER): Payer: Medicare Other | Admitting: Nurse Practitioner

## 2022-06-29 ENCOUNTER — Encounter: Payer: Self-pay | Admitting: Nurse Practitioner

## 2022-06-29 DIAGNOSIS — E1122 Type 2 diabetes mellitus with diabetic chronic kidney disease: Secondary | ICD-10-CM

## 2022-06-29 DIAGNOSIS — I130 Hypertensive heart and chronic kidney disease with heart failure and stage 1 through stage 4 chronic kidney disease, or unspecified chronic kidney disease: Secondary | ICD-10-CM

## 2022-06-29 DIAGNOSIS — E46 Unspecified protein-calorie malnutrition: Secondary | ICD-10-CM

## 2022-06-29 DIAGNOSIS — E538 Deficiency of other specified B group vitamins: Secondary | ICD-10-CM

## 2022-06-29 DIAGNOSIS — Z7189 Other specified counseling: Secondary | ICD-10-CM | POA: Diagnosis not present

## 2022-06-29 DIAGNOSIS — N1832 Chronic kidney disease, stage 3b: Secondary | ICD-10-CM

## 2022-06-29 DIAGNOSIS — R400 Somnolence: Secondary | ICD-10-CM | POA: Diagnosis not present

## 2022-06-29 DIAGNOSIS — I5032 Chronic diastolic (congestive) heart failure: Secondary | ICD-10-CM | POA: Diagnosis not present

## 2022-06-29 DIAGNOSIS — E039 Hypothyroidism, unspecified: Secondary | ICD-10-CM

## 2022-06-29 DIAGNOSIS — I110 Hypertensive heart disease with heart failure: Secondary | ICD-10-CM

## 2022-06-29 DIAGNOSIS — R634 Abnormal weight loss: Secondary | ICD-10-CM

## 2022-06-29 NOTE — Progress Notes (Signed)
Virtual Visit via mychart   This visit type was conducted due to national recommendations for restrictions regarding the COVID-19 Pandemic (e.g. social distancing) in an effort to limit this patient's exposure and mitigate transmission in our community.  Due to her co-morbid illnesses, this patient is at least at moderate risk for complications without adequate follow up.  This format is felt to be most appropriate for this patient at this time.  All issues noted in this document were discussed and addressed.  A limited physical exam was performed with this format.    This visit type was conducted due to national recommendations for restrictions regarding the COVID-19 Pandemic (e.g. social distancing) in an effort to limit this patient's exposure and mitigate transmission in our community.  Patients identity confirmed using two different identifiers.  This format is felt to be most appropriate for this patient at this time.  All issues noted in this document were discussed and addressed.  No physical exam was performed (except for noted visual exam findings with Video Visits).    Date:  07/04/2022   ID:  Leslie Reeves, DOB Jul 11, 1941, MRN [id number]  Patient Location:  Home - spoke with her daughter Leslie Reeves and patient Leslie Reeves (very minimal)  Provider location:   Office    Chief Complaint:  Somnolence  History of Present Illness:    Leslie Reeves is a 81 y.o. female who presents via video conferencing for a telehealth visit today.    The patient does not have symptoms concerning for COVID-19 infection (fever, chills, cough, or new shortness of breath).   Patient completing virtual today accompained by her daughter.  Daughter reports she is in the process of getting her on hospice. Unfortunately of now, the patient has unsteady mobility. She is having a hard time sitting up. Currently patient tends to sleep right. She is sleeping more. Her right arm is swollen down to hand -  right. Left one is more fatness to elbow area.  She will talk to her a little bit when she can get her awake. Breathing is stable unless you have to move her or reposition. She is having difficulty with getting her medications. She is trying to get a hospital bed. She is not choking. Making sure she is awake and alert enough to give her medications. Unable to give the vitamin b12. She ate breakfast this morning unable to hold utensils, was falling asleep but was able to get her to eat everything. Breakfast is her best meal. She continues to urinate using an adult diaper.   She had been doing blood transfusions over the next 2 weeks.   Nurse to come out on Thursday to come out and make an assessment with [facility].  Right arm swelling daughter reports.      Past Medical History:  Diagnosis Date   Allergy    Diabetes mellitus without complication ([place])    Hypertension    Past Surgical History:  Procedure Laterality Date   ABDOMINAL HYSTERECTOMY     THORACENTESIS Left 02/04/2022   Procedure: THORACENTESIS;  Surgeon: Candee Furbish, MD;  Location: [facility] ENDOSCOPY;  Service: Pulmonary;  Laterality: Left;     Current Meds  Medication Sig   acetaminophen (TYLENOL) 325 MG tablet Take 2 tablets (650 mg total) by mouth every 6 (six) hours as needed for headache or mild pain.   albuterol (PROVENTIL HFA) 108 (90 Base) MCG/ACT inhaler Inhale 2 puffs into the lungs every 6 (six) hours as  needed for wheezing or shortness of breath. (Patient taking differently: Inhale 2 puffs into the lungs daily.)   amLODipine (NORVASC) 10 MG tablet Take 1 tablet (10 mg total) by mouth daily.   blood glucose meter kit and supplies KIT Dispense based on patient and insurance preference. Use up to four times daily as directed. (FOR ICD-9 250.00, 250.01).   fluticasone-salmeterol (WIXELA INHUB) 100-50 MCG/ACT AEPB Inhale 1 puff into the lungs 2 (two) times daily.   glucose blood (ACCU-CHEK AVIVA PLUS) test strip Use as  instructed   isosorbide-hydrALAZINE (BIDIL) 20-37.5 MG tablet Take 1 tablet by mouth 3 (three) times daily.   levothyroxine (SYNTHROID) 112 MCG tablet TAKE 1 TABLET BY MOUTH DAILY (Patient taking differently: Take 112 mcg by mouth daily.)   losartan (COZAAR) 25 MG tablet Take 25 mg by mouth daily.   [DISCONTINUED] Cyanocobalamin (VITAMIN B-12 PO) Take 1 tablet by mouth daily.   [DISCONTINUED] memantine (NAMENDA) 10 MG tablet Take 1 tablet (10 mg total) by mouth daily.   [DISCONTINUED] metoprolol succinate (TOPROL-XL) 25 MG 24 hr tablet Take 25 mg by mouth daily.   [DISCONTINUED] mirtazapine (REMERON) 15 MG tablet Take 1 tablet (15 mg total) by mouth at bedtime.   [DISCONTINUED] rosuvastatin (CRESTOR) 20 MG tablet TAKE 1 TABLET BY MOUTH DAILY (Patient taking differently: Take 20 mg by mouth daily.)     Allergies:   Penicillin g, Quinapril, and Penicillins   Social History   Tobacco Use   Smoking status: Former    Packs/day: 0.50    Years: 50.00    Total pack years: 25.00    Types: Cigarettes    Quit date: 2017    Years since quitting: 6.7   Smokeless tobacco: Never  Vaping Use   Vaping Use: Never used  Substance Use Topics   Alcohol use: Not Currently   Drug use: Not Currently     Family Hx: The patient's family history includes Asthma in an other family member; COPD in an other family member; Cancer in an other family member; Heart disease in an other family member; Hypertension in an other family member; Stroke in an other family member. There is no history of Breast cancer.  ROS:   Please see the history of present illness.    Review of Systems  Constitutional: Negative.   Respiratory: Negative.    Cardiovascular: Negative.   Musculoskeletal: Negative.   Skin: Negative.   Neurological: Negative.   Endo/Heme/Allergies: Negative.   Psychiatric/Behavioral: Negative.      All other systems reviewed and are negative.   Labs/Other Tests and Data Reviewed:    Recent  Labs: 07/29/2021: ALT 41 06/24/2022: B Natriuretic Peptide 300.7; Magnesium 2.2; TSH 0.702 06/25/2022: BUN 56; Creatinine, Ser 2.41; Hemoglobin 11.4; Platelets 188; Potassium 4.0; Sodium 146   Recent Lipid Panel Lab Results  Component Value Date/Time   CHOL 158 11/21/2019 11:02 AM   TRIG 70 11/21/2019 11:02 AM   HDL 78 11/21/2019 11:02 AM   CHOLHDL 2.0 11/21/2019 11:02 AM   LDLCALC 66 11/21/2019 11:02 AM    Wt Readings from Last 3 Encounters:  06/24/22 120 lb (54.4 kg)  05/26/22 111 lb (50.3 kg)  05/12/22 111 lb (50.3 kg)     Exam:    Vital Signs:  There were no vitals taken for this visit.    Physical Exam  ASSESSMENT & PLAN:    1. Somnolence Recent ER visit due to somolence, she is declining and likely nearing the end of life. Sleeping more  hours than awake and taking bites.  2. Malnutrition, unspecified type ([place])   3. Hypertensive heart disease with chronic diastolic congestive heart failure ([place]) She will continue taking her blood pressure medications as able. Advised if not fully awake to hold medications.  4. Type 2 diabetes mellitus with stage 3b chronic kidney disease, without long-term current use of insulin (H[facility]) No current medications. Encouraged to give patient what she will eat when awake  5. Acquired hypothyroidism Will keep her on her medications at this time  6. Counseling regarding end of life decision making Discussed with daughter the signs of end of life, she is ready for Hospice to be involved in her care. Unfortunately they can not admit until Thursday. Will discontinue several medications at this time due to not necessary. This will be discussed further with the hospice provider   COVID-19 Education: The signs and symptoms of COVID-19 were discussed with the patient and how to seek care for testing (follow up with PCP or arrange E-visit).  The importance of social distancing was discussed today.  Patient Risk:   After full review of this  patients clinical status, I feel that they are at least moderate risk at this time.  Time:   Today, I have spent 18 minutes/ seconds with the patient with telehealth technology discussing above diagnoses.     Medication Adjustments/Labs and Tests Ordered: Current medicines are reviewed at length with the patient today.  Concerns regarding medicines are outlined above.   Tests Ordered: No orders of the defined types were placed in this encounter.   Medication Changes: No orders of the defined types were placed in this encounter.   Disposition:  Follow up prn  Signed, [name], FNP

## 2022-07-02 ENCOUNTER — Encounter (HOSPITAL_COMMUNITY): Payer: Medicare Other

## 2022-07-03 ENCOUNTER — Other Ambulatory Visit: Payer: Self-pay | Admitting: Neurology

## 2022-07-16 ENCOUNTER — Encounter (HOSPITAL_COMMUNITY): Payer: Medicare Other

## 2022-07-19 ENCOUNTER — Ambulatory Visit: Payer: Medicare Other | Admitting: Nurse Practitioner

## 2022-07-28 DEATH — deceased

## 2022-08-24 ENCOUNTER — Ambulatory Visit: Payer: Medicare Other | Admitting: Neurology

## 2022-09-28 IMAGING — CT CT CHEST W/O CM
1 of 2 series · 15 of 32 positions shown, 19 images · non-contrast
Comparison: 12/30/2020

CLINICAL DATA: Follow-up right middle lobe nodule



[Series 6: super d · axial · 0.71mm/px · z∈[-522,-264]mm · 15 of 361 slices shown, 19 images]
[im 19/361  mediastinal]
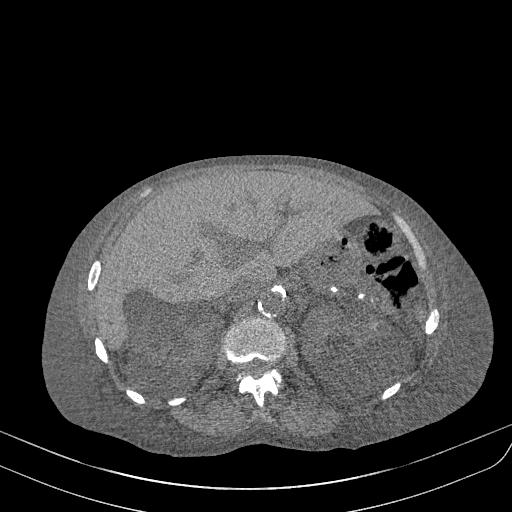
[im 19/361  lung]
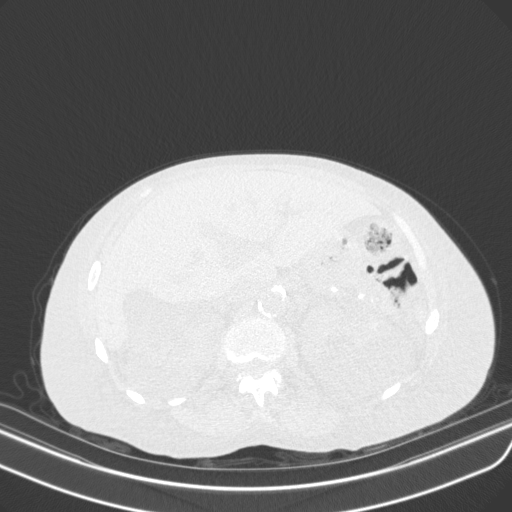
[im 57/361  lung]
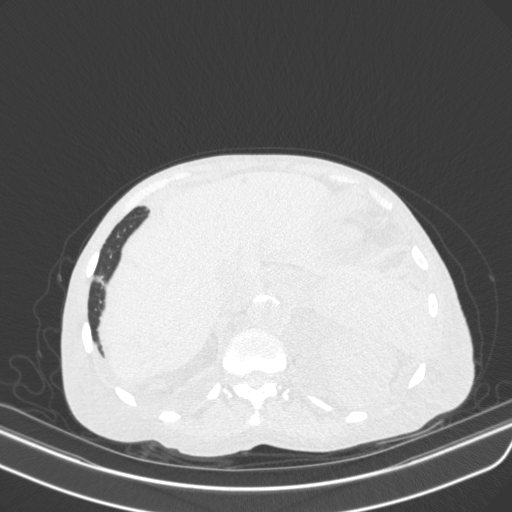
[im 76/361  lung]
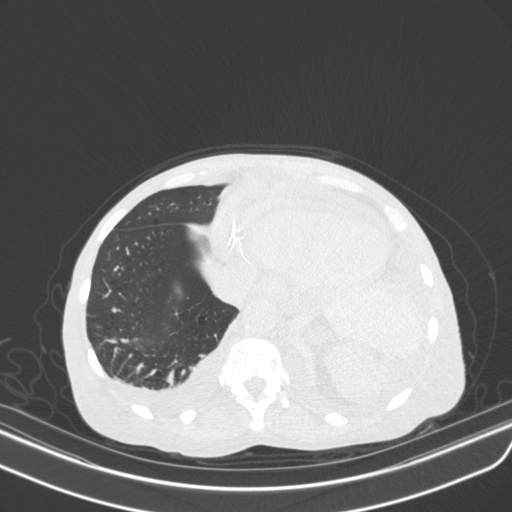
[im 95/361  lung]
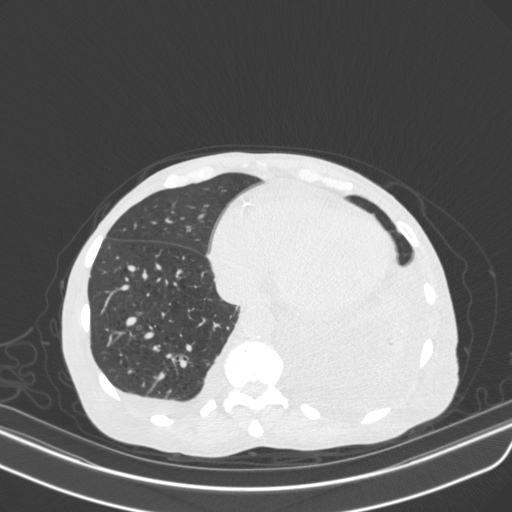
[im 121/361  mediastinal]
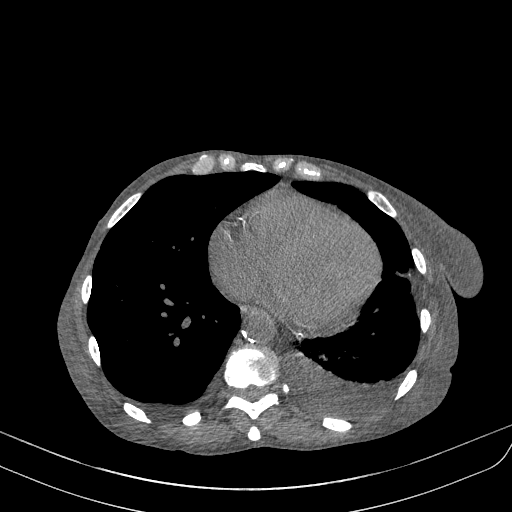
[im 121/361  lung]
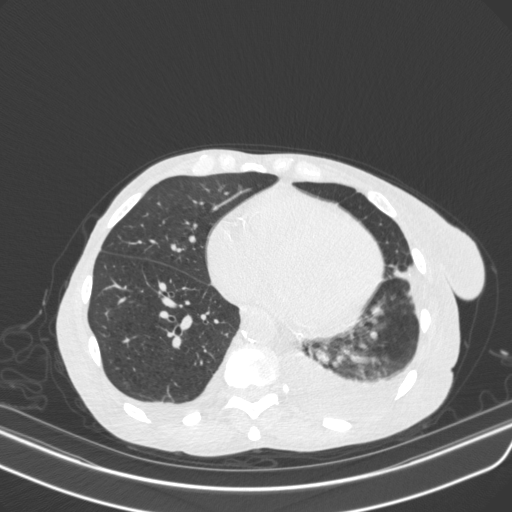
[im 133/361  lung]
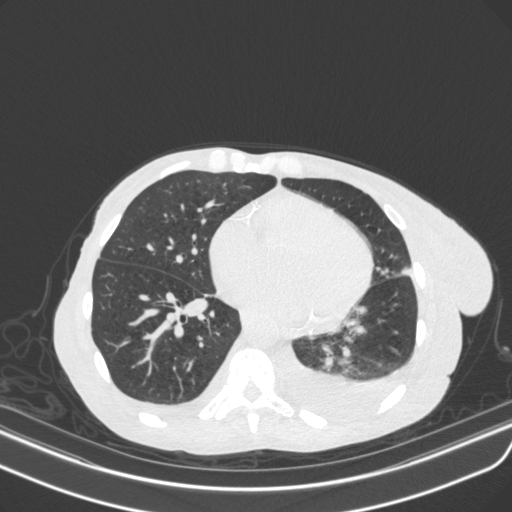
[im 169/361  lung]
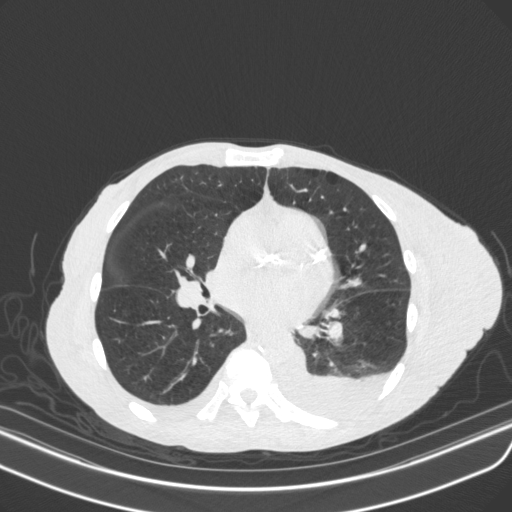
[im 171/361  lung]
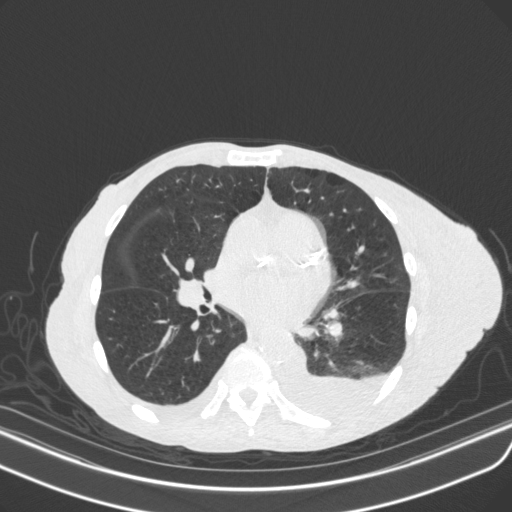
[im 190/361  mediastinal]
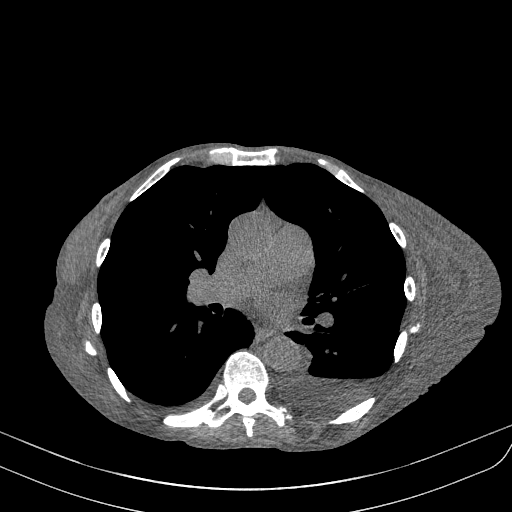
[im 190/361  lung]
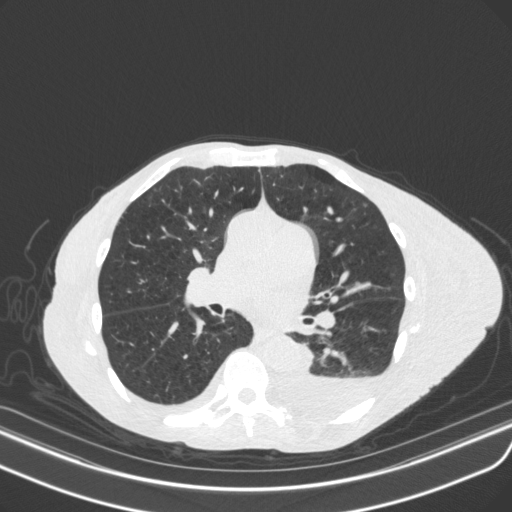
[im 228/361  lung]
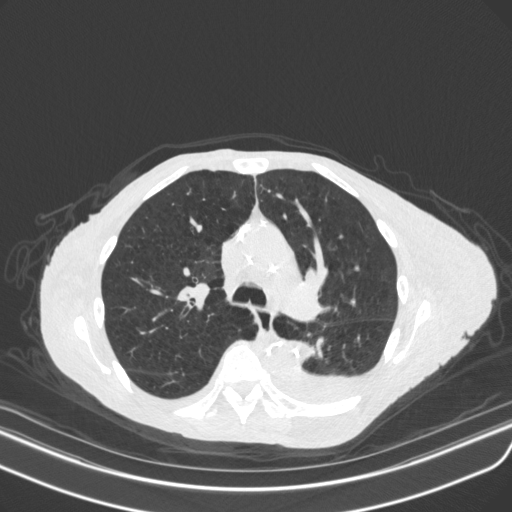
[im 241/361  lung]
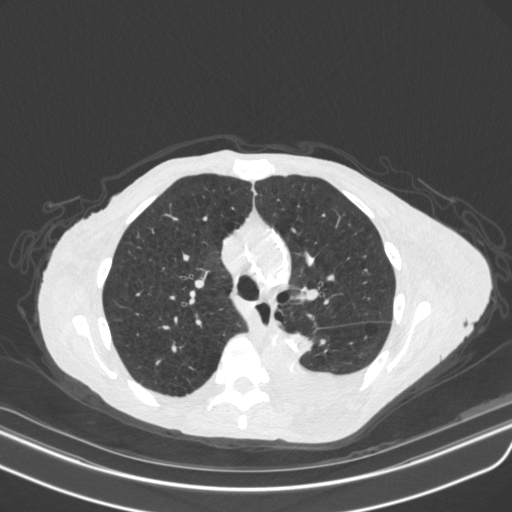
[im 266/361  lung]
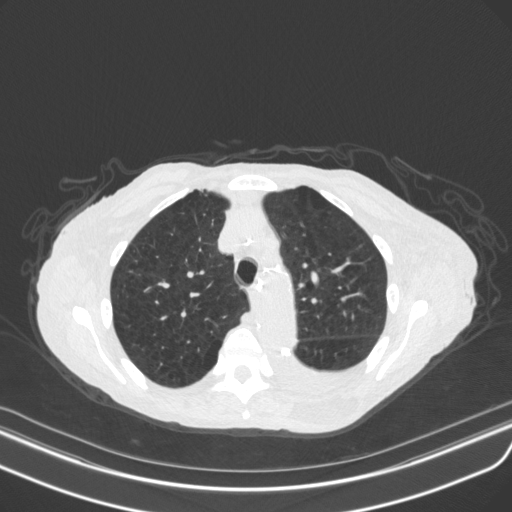
[im 285/361  mediastinal]
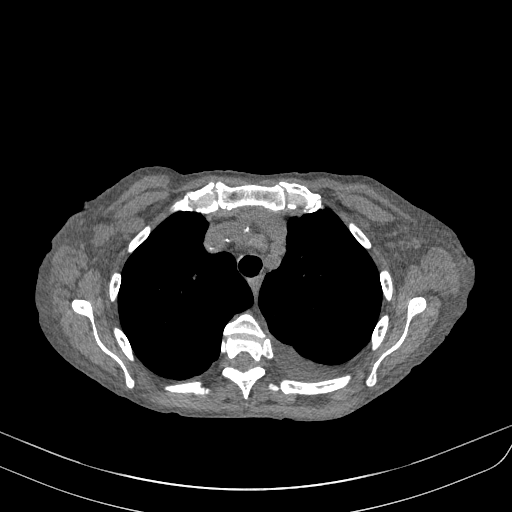
[im 285/361  lung]
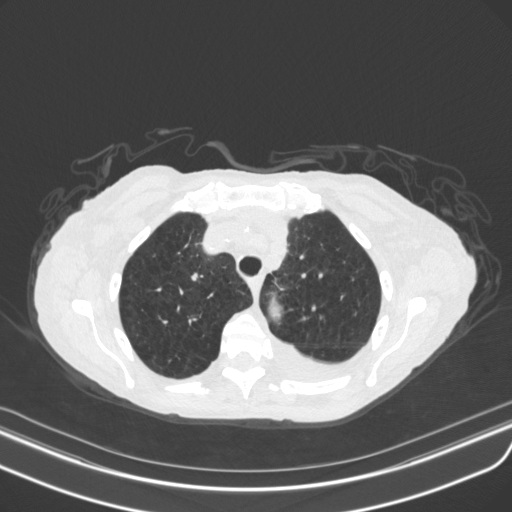
[im 304/361  lung]
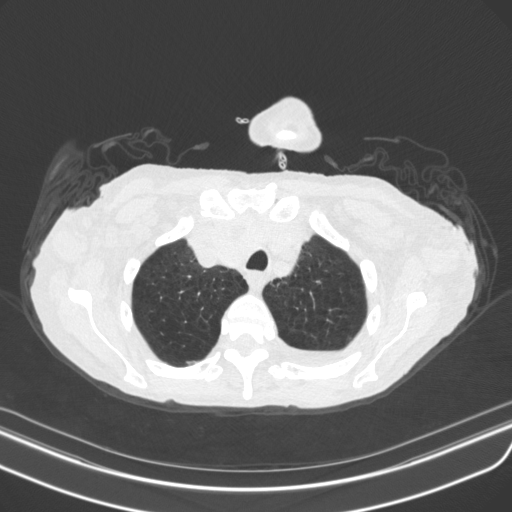
[im 342/361  lung]
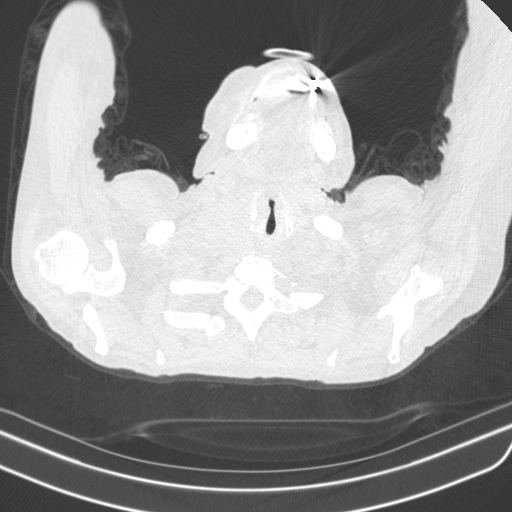

[15 of 32 positions shown; findings below may reference images not displayed]

FINDINGS: Cardiovascular: Limited due to lack of IV contrast. Atherosclerotic
calcifications of the thoracic aorta are noted. No aneurysmal
dilatation is noted. MV coronary calcifications are seen. No cardiac
enlargement is noted. Small pericardial effusion is noted new from
the prior study.

Mediastinum/Nodes: Thoracic inlet demonstrates thyromegaly stable in
appearance from prior ultrasound examination. No hilar or
mediastinal adenopathy is noted. The esophagus is within normal
limits.

Lungs/Pleura: Diffuse emphysematous changes are noted. Small
effusions are noted left greater than right. Mild left basilar
atelectasis is noted. The previously seen right middle lobe nodule
is again identified and stable. Given its stability, no further
follow-up is recommended.

Upper Abdomen: Renal cystic changes noted bilaterally. Some of these
are hyperdense in nature but stable in appearance from previous
exam.

Musculoskeletal: Degenerative changes of the thoracic spine are
noted. No acute rib abnormality is seen.
IMPRESSION: Stable right middle lobe nodule. Given its stability, no further
follow-up is recommended.

Enlarged thyroid stable in appearance from the prior CT examination.
This has been evaluated on previous imaging. (ref: [HOSPITAL].
[DATE]): 143-50).

Small pleural effusions left greater than right with associated left
basilar atelectasis.

Chronic renal cystic change some of which are hyperdense in nature
stable from the prior study.

Aortic Atherosclerosis (EH03T-Y0X.X) and Emphysema (EH03T-[id number].P).

## 2022-10-17 IMAGING — DX DG CHEST 2V
2 series · 2 of 2 positions shown · non-contrast
Comparison: 02/04/2022

CLINICAL DATA: Status post thoracentesis.

EXAM:
CHEST - 2 VIEW

[chest pa]
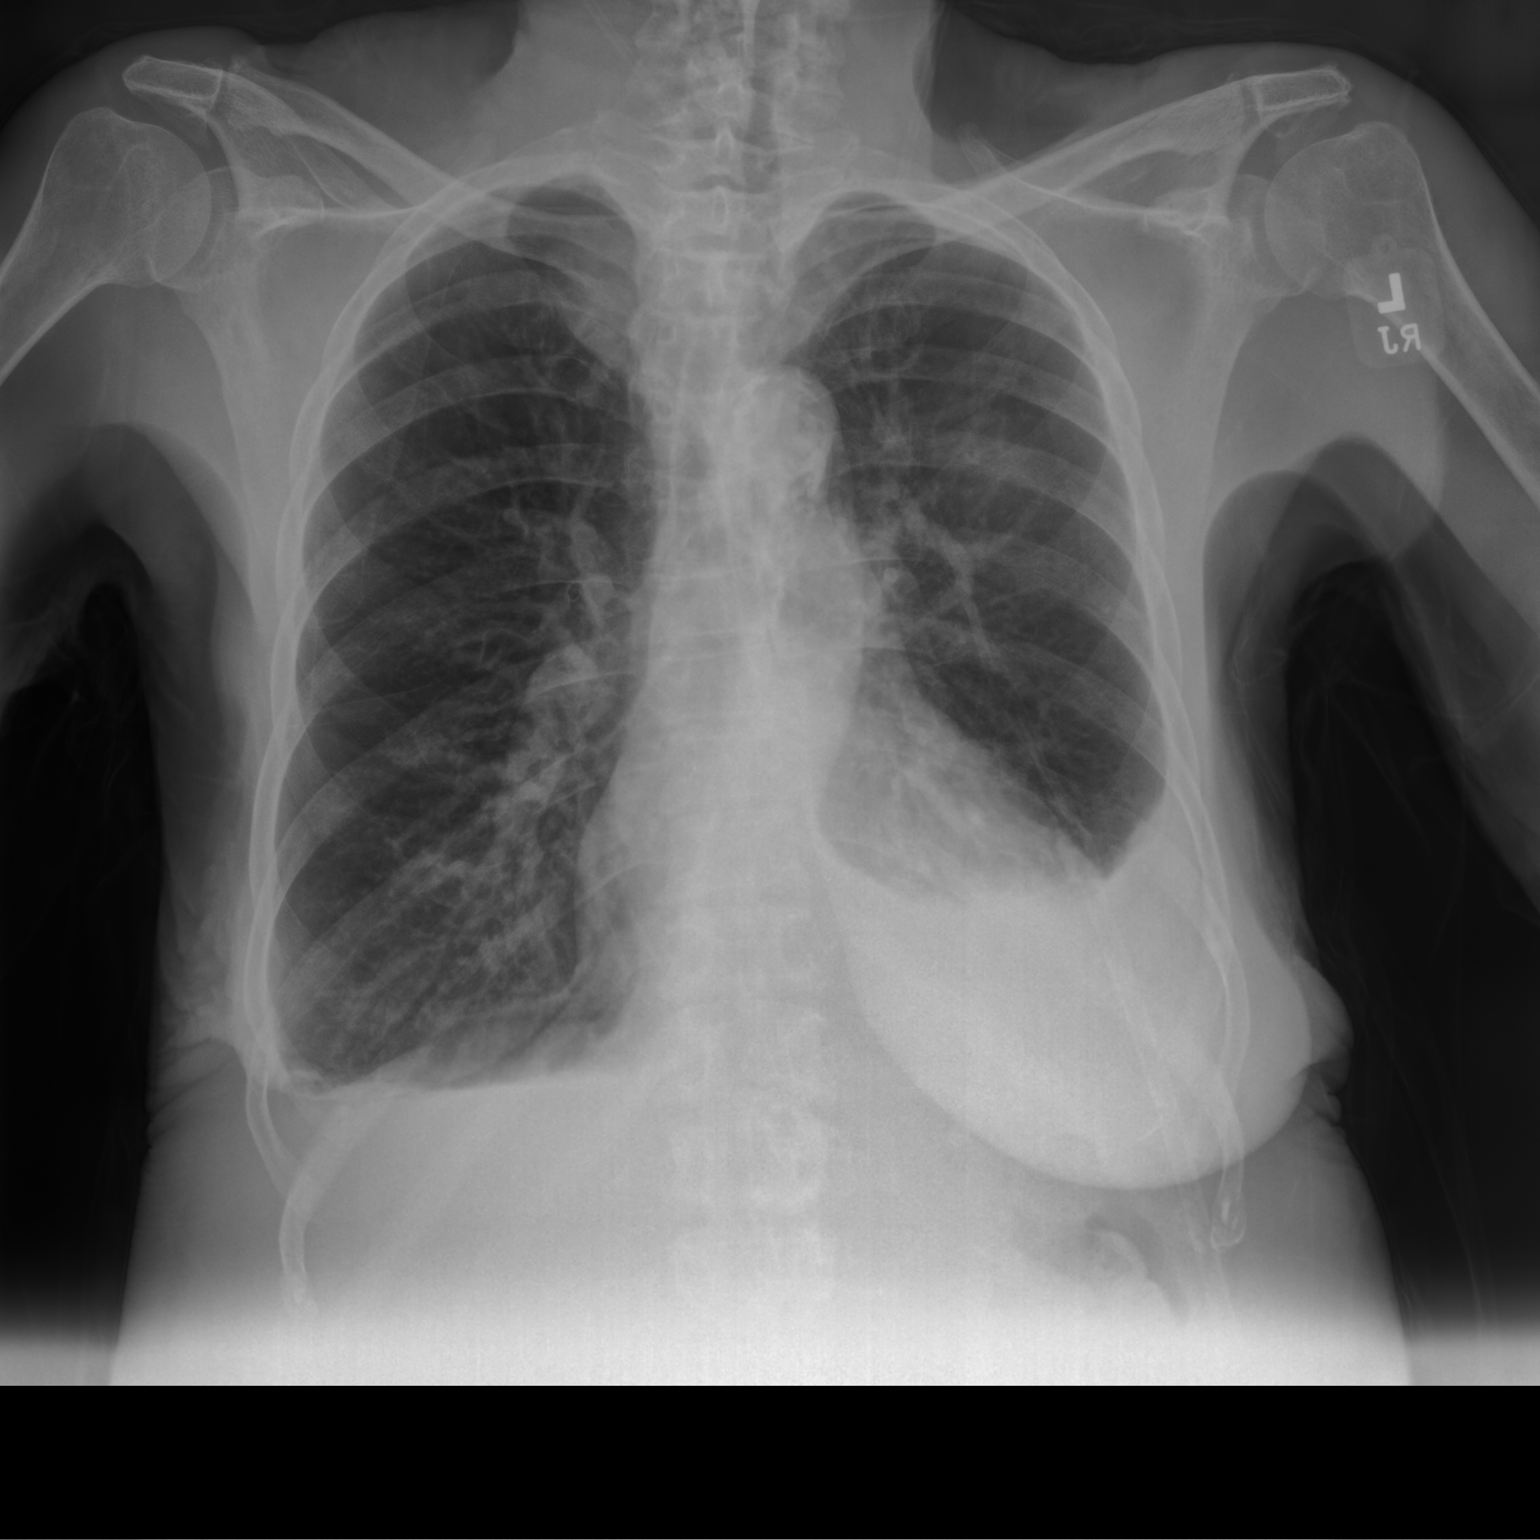

[chest lat]
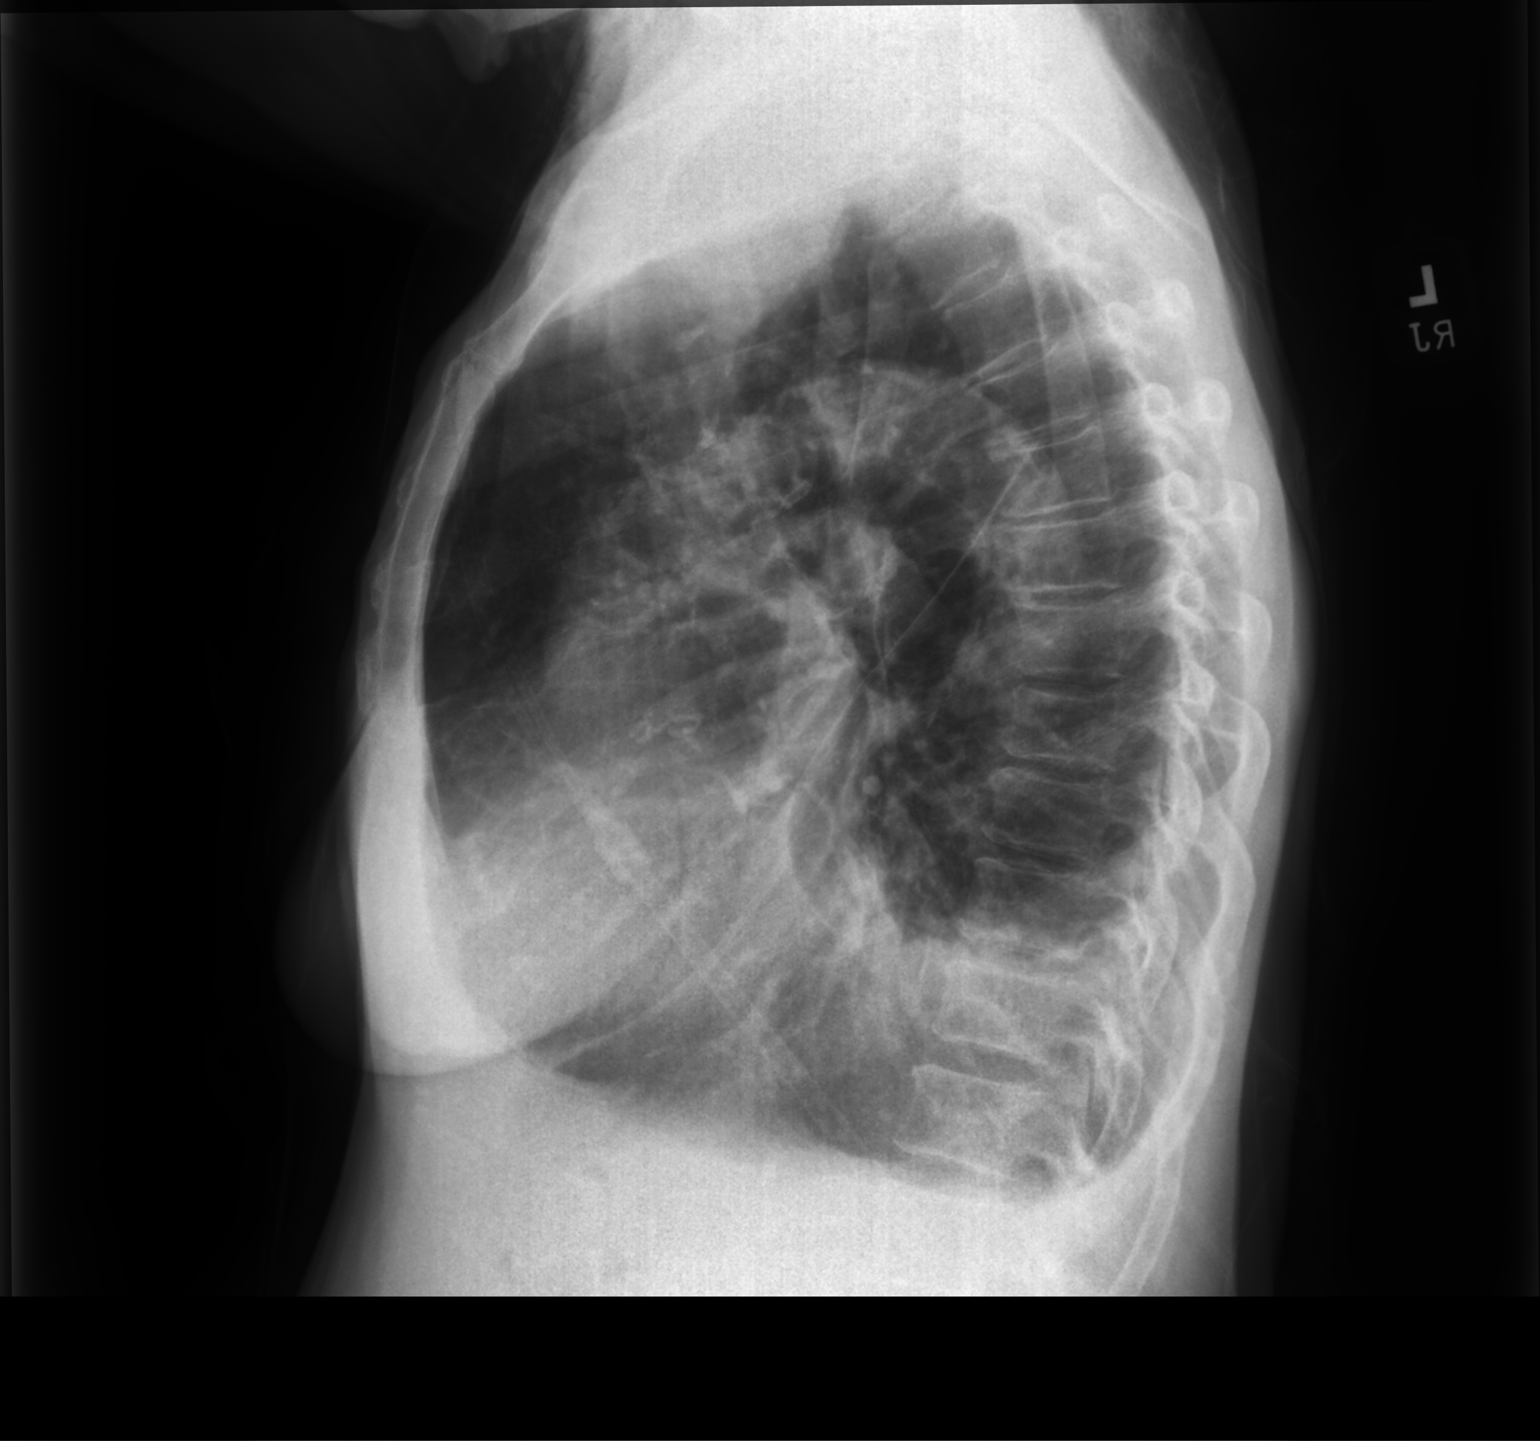

[2 of 2 positions shown; findings below may reference images not displayed]

FINDINGS: Lungs are hyperexpanded. Small left and tiny right pleural effusions
evident. No evidence for pneumothorax. The cardio pericardial
silhouette is enlarged. The visualized bony structures of the thorax
are unremarkable.
IMPRESSION: Bilateral pleural effusions, left greater than right. No
pneumothorax.

## 2023-05-25 ENCOUNTER — Ambulatory Visit: Payer: Medicare Other
